# Patient Record
Sex: Female | Born: 1950
Health system: Southern US, Community
[De-identification: ages and names within clinical notes are randomized; demographics above are authoritative.]

## PROBLEM LIST (undated history)

## (undated) DIAGNOSIS — F32A Depression, unspecified: Secondary | ICD-10-CM

## (undated) DIAGNOSIS — F419 Anxiety disorder, unspecified: Secondary | ICD-10-CM

## (undated) DIAGNOSIS — H509 Unspecified strabismus: Secondary | ICD-10-CM

## (undated) DIAGNOSIS — H169 Unspecified keratitis: Secondary | ICD-10-CM

## (undated) DIAGNOSIS — H269 Unspecified cataract: Secondary | ICD-10-CM

## (undated) DIAGNOSIS — I10 Essential (primary) hypertension: Secondary | ICD-10-CM

## (undated) DIAGNOSIS — F329 Major depressive disorder, single episode, unspecified: Secondary | ICD-10-CM

## (undated) DIAGNOSIS — M179 Osteoarthritis of knee, unspecified: Secondary | ICD-10-CM

## (undated) DIAGNOSIS — M1710 Unilateral primary osteoarthritis, unspecified knee: Secondary | ICD-10-CM

## (undated) DIAGNOSIS — I639 Cerebral infarction, unspecified: Secondary | ICD-10-CM

## (undated) DIAGNOSIS — M1990 Unspecified osteoarthritis, unspecified site: Secondary | ICD-10-CM

## (undated) HISTORY — DX: Unspecified keratitis: H16.9

## (undated) HISTORY — DX: Unspecified strabismus: H50.9

## (undated) HISTORY — DX: Depression, unspecified: F32.A

## (undated) HISTORY — PX: EYE SURGERY: SHX253

## (undated) HISTORY — DX: Unspecified cataract: H26.9

## (undated) HISTORY — DX: Anxiety disorder, unspecified: F41.9

## (undated) HISTORY — PX: TUBAL LIGATION: SHX77

## (undated) HISTORY — PX: CORNEAL TRANSPLANT: SHX108

## (undated) HISTORY — DX: Osteoarthritis of knee, unspecified: M17.9

## (undated) HISTORY — PX: CHOLECYSTECTOMY: SHX55

## (undated) HISTORY — DX: Unspecified osteoarthritis, unspecified site: M19.90

## (undated) HISTORY — DX: Unilateral primary osteoarthritis, unspecified knee: M17.10

## (undated) HISTORY — DX: Major depressive disorder, single episode, unspecified: F32.9

---
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
## 1998-07-09 ENCOUNTER — Other Ambulatory Visit: Admission: RE | Admit: 1998-07-09 | Discharge: 1998-07-09 | Payer: Self-pay | Admitting: *Deleted

## 2000-03-22 ENCOUNTER — Encounter: Admission: RE | Admit: 2000-03-22 | Discharge: 2000-03-22 | Payer: Self-pay | Admitting: Obstetrics & Gynecology

## 2000-03-22 ENCOUNTER — Other Ambulatory Visit: Admission: RE | Admit: 2000-03-22 | Discharge: 2000-03-22 | Payer: Self-pay | Admitting: Obstetrics

## 2001-10-14 ENCOUNTER — Inpatient Hospital Stay (HOSPITAL_COMMUNITY): Admission: AD | Admit: 2001-10-14 | Discharge: 2001-10-15 | Payer: Self-pay | Admitting: Internal Medicine

## 2002-01-08 ENCOUNTER — Inpatient Hospital Stay (HOSPITAL_COMMUNITY): Admission: EM | Admit: 2002-01-08 | Discharge: 2002-01-10 | Payer: Self-pay | Admitting: Emergency Medicine

## 2002-01-08 ENCOUNTER — Encounter: Payer: Self-pay | Admitting: Emergency Medicine

## 2008-05-31 ENCOUNTER — Ambulatory Visit (HOSPITAL_COMMUNITY): Admission: RE | Admit: 2008-05-31 | Discharge: 2008-05-31 | Payer: Self-pay | Admitting: Internal Medicine

## 2010-08-28 NOTE — Discharge Summary (Signed)
[place]. [facility]  Patient:    Michelle Carrillo Visit Number: [id number] MRN: [id number]          Service Type: MED Location: 3700 3739 [id number] Attending Physician:  Lewayne Bunting Dictated by:   Guy Franco, P.A. Admit Date:  10/14/2001 Discharge Date: 10/15/2001   CC:         [name] at [facility]   Referring Physician Discharge [place]  DATE OF BIRTH:  February 03, 1951  DISCHARGE DIAGNOSES: 1. Chest pain. 2. Hypertension. 3. Anxiety. 4. Cholelithiasis.  HOSPITAL COURSE:  The patient is a 60 year old Hispanic female who presented in transfer from [name] at [facility].  Over the past 24 hours prior to admission she did not feel well, with occasional chest pain.  Apparently she has recently had ultrasound of her abdomen that revealed cholelithiasis and a cholecystectomy is pending.  She has a history of hypertension, questionable hyperlipidemia, as well as anxiety.  Her 12-lead EKG revealed normal sinus rhythm, rate 92, with nonspecific ST-T wave changes in the inferior lateral leads.  As part of her workup she underwent cardiac isoenzyme testing and these were normal.  Her total cholesterol was 187 with triglyceride 114, HDL 47, LDL 117. Hemoglobin A1c 5.5.  Hemoglobin 11.7, hematocrit 34.8, platelets 268, white count 7.9.  TSH 1.697.  Because she ruled out with her enzymes overnight and remained hemodynamically stable, we did prepare her for discharge to home after a 24-hour admission. She denied any further chest pain or shortness of breath.  The discharge instructions were discussed with the daughter, who is fluent in [place].  DISCHARGE INSTRUCTIONS:  MEDICATIONS: 1. Lisinopril 10 mg q.d. 2. HCTZ 25 mg one q.d. 3. Clorazepate 7.5 mg b.i.d. 4. Nitroglycerin sublingual as needed for chest pain.  ACTIVITY:  No strenuous activity.  DIET:  Low fat diet.  FOLLOW-UP:  The office will call her tomorrow morning  with plans to do a 2-D echo and a stress Cardiolite.  She is not to eat after midnight. Dictated by:   Guy Franco, P.A. Attending Physician:  Lewayne Bunting DD:  10/15/01 TD:  10/16/01 Job: [id number]

## 2010-08-28 NOTE — H&P (Signed)
[place]. [facility]  Patient:    Michelle Carrillo Visit Number: [id number] MRN: [id number]          Service Type: MED Location: 3700 3739 01 Attending Physician:  Lewayne Bunting Dictated by:   Heloise Beecham, M.D. [facility] Admit Date:  10/14/2001 Discharge Date: 10/15/2001                           History and Physical  CHIEF COMPLAINT:  The patient presents with a chief complaint of chest pain.  HISTORY OF PRESENT ILLNESS:  She is a 60 year old Hispanic female, who speaks no English, transferred from [facility], care of [name].  She complains of 24 hours of intermittent sharp stabbing chest pain over the left chest which is nonradiating, lasting only seconds.  She attributes this to anxiety and nervousness.  She reports no associated nausea, vomiting, diaphoresis, radiation, dizziness, etc.  The patient denies any palpitations, lower extremity edema, orthopnea, PND, claudication, etc.  She has been healthy and otherwise doing well until recently other than poorly controlled hypertension.  PAST MEDICAL HISTORY:  1. Hypertension, not well controlled per the patients history.  2. Hyperlipidemia, not treated currently on lipid lowering therapy.  3. Anxiety.  4. Recent eye surgery.  5. Cholelithiasis.  The patient states she has a cholecystectomy pending.  ALLERGIES:  No known drug allergies.  MEDICATIONS:  1. Lisinopril 10 mg q.d.  2. Hydrochlorothiazide 25 mg p.o. q.d.  3. Clorazepate 7.5 mg p.o. b.i.d. as needed.  SOCIAL HISTORY:  [place] and family.  She has four children who are healthy.  She is a Futures trader.  No tobacco, alcohol, or drug use.  FAMILY HISTORY:  Her mother had diabetes.  REVIEW OF SYSTEMS:  Otherwise negative for the following: Fevers, chills, sweats, adenopathy, visual changes, difficulty swallowing, rash, lesions, frequency, urgency, dysuria,  abdominal pain, nausea, vomiting, bright red blood per rectum, melena, polyuria, polydipsia.  All other Review Of Systems negative except for anxiety and occasional diarrhea.  PHYSICAL EXAMINATION:  VITAL SIGNS:  Blood pressure 140/80.  [facility].  They noted that the patient had orthostatic hypotension.  Blood pressures here are as follows: Lying 142/60, heart rate 94; sitting 152/82, heart rate 80; standing 124/80, heart rate 100.  The patient is not dizzy with this change.  The patient is afebrile.  Pulse 100.  Respiratory rate 16. Saturation 98% on room air.  GENERAL:  She is an obese female in no apparent distress.  HEENT:  Normocephalic, atraumatic.  PERRLA.  EOMI.  Oropharynx, oral cavity clear.  NECK:  Supple.  No bruits, no JVD.  Carotid pulses 2+.  No lymphadenopathy.  HEART:  Regular rate and rhythm.  Normal S1 and S2.  No S3 or S4.  The patient has a 1/6 systolic ejection murmur at the base.  PMI is within normal limits.  LUNGS:  Clear to auscultation bilaterally with no wheezes, rales, or rhonchi.  SKIN:  No rash, no lesions.  ABDOMEN:  Soft, nontender, nondistended.  No rebound or guarding.  No hepatosplenomegaly.  EXTREMITIES:  No clubbing, cyanosis, or edema.  Lower extremity 2+ pulses.  NEUROLOGIC:  Intact.  LABORATORY DATA:  Chest x-ray is pending.  EKG shows sinus tachycardia with a rate of 103, normal axis; QRS normal width; slight nonspecific inferolateral T wave inversions.  CBC WBC 11.4, hemoglobin 13.5,  hematocrit 39.6, platelets 346,000.  Other laboratories are currently pending.  ASSESSMENT/PLAN:  1. The patient is a 60 year old female with obesity, hypertension, and     hyperlipidemia, who presents with atypical chest pain.  The pain does not     sound cardiac in nature; however, we will follow the patients symptoms.     She will be monitored on telemetry and we will proceed with rule out     myocardial infarction  protocol.  I suspect that the patient will be     discharged in the morning.  One possibility would be to start an H2     blocker and I will recommend this.  I will continue her current medication     otherwise.  I will not heparinize her and given the patients low     likelihood I think a follow-up stress test as an outpatient will be     warranted, especially for preoperative evaluation for cholecystectomy.     The patient chest pain most likely represents anxiety.  2. Hypertension.  Will continue above medications.  Will follow orthostatic     blood pressures in the morning.  3. Hyperlipidemia.  Lipid panel is pending.  4. Endocrine.  Thyroid-stimulating hormone is pending as well as a     hemoglobin A1C. Dictated by:   Heloise Beecham, M.D. [facility] Attending Physician:  Lewayne Bunting DD:  10/14/01 TD:  10/17/01 Job: [id number]

## 2010-08-28 NOTE — Cardiovascular Report (Signed)
   NAMEEdward Qualia                             ACCOUNT NO.:  [id number]   MEDICAL RECORD NO.:  [id number]                   PATIENT TYPE:  INP   LOCATION:  3742                                 FACILITY:  [facility]   PHYSICIAN:  Salvadore Farber, M.D. [facility]         DATE OF BIRTH:  03/08/1951   DATE OF PROCEDURE:  01/09/2002  DATE OF DISCHARGE:  01/10/2002                              CARDIAC CATHETERIZATION   PROCEDURES:  Left heart catheterization, left ventriculogram, coronary  angiography.   INDICATIONS:  The patient is a 59 year old lade with hypertension and  dyslipidemia who presented in July with chest pain.  ETT/MIBI was without  evidence of ischemia. However, she represented now with recurrent substernal  chest discomfort.  She is referred for diagnostic angiography to make a  definitive diagnosis.   DIAGNOSTIC TECHNIQUE:  Informed consent was obtained.  Under 2% lidocaine  local anesthesia, a 6 French sheath was placed in the right femoral artery  using the modified Seldinger technique. A JL4, JL 3.5, and JR4 catheters  were used.  Angiography was performed by hand injection. A 6 French pigtail  catheter was advanced into the left ventricle.  Pressures were measured.  Ventriculography was performed by power injection.  The patient was  transferred to the holding room in stable condition having tolerated the  procedure well.  The sheaths will be removed in the holding room.   COMPLICATIONS:  None.   FINDINGS:  1. LV:  168/13/18 after the administration of contrast.  EF equals 65%     without region wall motion abnormality.  No mitral regurgitation or     aortic stenosis.  2. Left main:  Short and angiographically normal.  3. LAD:  The LAD is a relatively small vessel which does not reach the apex     of the heart. It gives rise to a single diagonal branch. It is     angiographically normal.  4. Circumflex:  The circumflex is a large vessel giving rise to four obtuse  marginal branches.  It is angiographically normal.  5. RCA:  The RCA is a large vessel with a large PDA which reaches the apex.     It is angiographically normal.   IMPRESSION/PLAN:  Angiographically normal coronary arteries. Plan outpatient  evaluation of noncardiac chest pain.                                                      Salvadore Farber, M.D. [facility]    WED/MEDQ  D:  01/09/2002  T:  01/11/2002  Job:  [id number]

## 2010-08-28 NOTE — Discharge Summary (Signed)
NAMEEdward Carrillo                             ACCOUNT NO.:  [id number]   MEDICAL RECORD NO.:  [id number]                   PATIENT TYPE:  INP   LOCATION:  3742                                 FACILITY:  [facility]   PHYSICIAN:  Doylene Canning. Ladona Ridgel, M.D. [facility]           DATE OF BIRTH:  03-04-1951   DATE OF ADMISSION:  01/08/2002  DATE OF DISCHARGE:  01/10/2002                           DISCHARGE SUMMARY - REFERRING   BRIEF HISTORY:  This is a 60 year old Hispanic female admitted in July with  substernal chest pain.  She was ruled out overnight and discharged home.  A  Cardiolite and 2-D echo were performed on an outpatient basis that were  negative per patient's report.  The patient presented again with substernal  chest pain, dyspnea on exertion and palpitations x3 days; she was admitted  to [facility] on January 08, 2002 for further evaluation.   PAST MEDICAL HISTORY:  Past medical history is significant for hypertension,  hyperlipidemia and anxiety.   ALLERGIES:  No known drug allergies.   SOCIAL HISTORY:  The patient lives in [place] with her husband.  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  She does not use tobacco or alcohol.   HOSPITAL COURSE:  As noted, this patient was admitted to [facility]  for evaluation of chest pain.  She underwent cardiac catheterization on  January 09, 2002, performed by Dr. Salvadore Farber.  She was found to  have normal coronary arteries with an ejection fraction of 65%.  She was  discharged home on January 10, 2002 in improved and stable condition.   LABORATORY AND ACCESSORY CLINICAL DATA:  An EKG showed sinus bradycardia,  rate 57 beats per minute, but was otherwise normal.  A CBC on admission  revealed hemoglobin 15.1, hematocrit 45.4, WBC 9600, platelets 261,000.  Chemistries were unremarkable with a glucose of 123.  AST was elevated at  42, ALP was elevated at 158, total bilirubin elevated at 1.5.  Cardiac  enzymes  were negative.  A lipid profile revealed cholesterol of 184,  triglycerides 102, HDL 48, LDL 116.   DISCHARGE MEDICATIONS:  1. Lisinopril 10 mg daily.  2. Hydrochlorothiazide 25 mg daily.  3. The patient was told to take Tylenol as needed for pain.   ACTIVITY:  She was to avoid any strenuous activity or driving for two days.  She was not to lift more than 10 pounds for one week.   DIET:  She was to be on a low-salt, low-fat diet.   SPECIAL DISCHARGE INSTRUCTIONS:  She was told to call the office if she had  any increased pain, swelling, bleeding or problems with her groin.   FOLLOWUP:  She was to [facility]  She was to follow up with the [facility], October 17th, at 11 a.m.   PROBLEM LIST AT TIME  OF DISCHARGE:  1. Chest pain with negative cardiac enzymes.  2. Cardiac catheterization performed this admission revealing normal     coronary arteries and normal left ventricle.  3. Mildly elevated liver function tests.  4. History of hyperlipidemia.  5. History of anxiety.  6. History of hypertension.     Delton See, P.A. [facility]                  Doylene Canning. Ladona Ridgel, M.D. [facility]    DR/MEDQ  D:  02/13/2002  T:  02/14/2002  Job:  [id number]   cc:   Otho Darner, M.D.  [address]  [place]  [place] [address]  Fax: [phone]

## 2010-08-28 NOTE — H&P (Signed)
NAMEArdine Carrillo EVA                             ACCOUNT NO.:  [id number]   MEDICAL RECORD NO.:  [id number]                   PATIENT TYPE:  INP   LOCATION:                                       FACILITY:  [facility]   PHYSICIAN:  Thomas C. Wall, M.D. [facility]            DATE OF BIRTH:  [birth year]   DATE OF ADMISSION:  01/08/2002  DATE OF DISCHARGE:  01/10/2002                                HISTORY & PHYSICAL   HISTORY OF PRESENT ILLNESS:  The patient is a 60 year old Hispanic female  who is being readmitted for substernal chest pain.  Even with an  interpreter, it is hard to sort out much of this.   She has also had some dyspnea on exertion and palpitations.   She was admitted here in July and ruled out for infarct.  Apparently, she  had a negative stress Cardiolite and echocardiogram in the office; we are  pulling those records at present.   She presents to the ER this afternoon with the same problem.   Cardiac risk factors include hypertension, obesity, and hyperlipidemia; the  lipidemia does not appear to be treated.   She has not had a catheterization.   CURRENT MEDICATIONS:  1. Lisinopril 10 mg a day.  2. Hydrochlorothiazide 25 mg a day.  3. Sublingual nitroglycerin p.r.n.   ALLERGIES:  She has no known drug allergies.   SOCIAL HISTORY:  She lives in [place] with her husband.  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  She enjoys walking.  She does not drink  or smoke.   FAMILY HISTORY:  Mother died of cancer at age 67.  Father is still living  with no coronary disease.   REVIEW OF SYSTEMS:  Review of systems are all unremarkable except for the  HPI.   PHYSICAL EXAMINATION:  VITAL SIGNS:  Her blood pressure is 161/82.  Pulse is  96 and regular.  She is afebrile.  Respiratory rate is 15 and unlabored.  O2  saturation is 98% on room air.  GENERAL:  She is in no acute distress.  HEENT:  Exam is unremarkable.  NECK:  Neck shows no JVD.  Carotid upstrokes are equal  bilaterally without  bruits.  There is no thyromegaly.  HEART:  Heart reveals a soft systolic murmur, left upper sternal border.  S2  does not clearly split.  The murmur is not late-peaking.  LUNGS:  Lungs are clear to auscultation and percussion.  ABDOMEN:  Soft.  Good bowel sounds.  EXTREMITIES:  Extremities are without any cyanosis, clubbing or edema.  Pulses are intact.   LABORATORY AND ACCESSORY CLINICAL DATA:  EKG shows ST segment changes  inferolaterally with sinus tachycardia.   Labs are pending.   ASSESSMENT:  1. Recurrent chest pain, not typical for classic ischemia symptoms, however,     the patient has several risk factors and continues  to come the hospital     with chest pain.  2. Hypertension.  3. Hyperlipidemia.   PLAN:  1. Cardiac catheterization for diagnostic purposes.  Indications, risks and     potential benefits have been explained to the patient through the     interpreter with Guy Franco, P.A.  2. Check lipid panel in the morning.                                               Thomas C. Daleen Squibb, M.D. [facility]    TCW/MEDQ  D:  01/08/2002  T:  01/10/2002  Job:  [id number]

## 2011-10-27 DIAGNOSIS — T868419 Corneal transplant failure, unspecified eye: Secondary | ICD-10-CM | POA: Insufficient documentation

## 2014-01-04 DIAGNOSIS — Z8619 Personal history of other infectious and parasitic diseases: Secondary | ICD-10-CM | POA: Insufficient documentation

## 2014-10-14 ENCOUNTER — Encounter (HOSPITAL_COMMUNITY): Payer: Self-pay | Admitting: Emergency Medicine

## 2014-10-14 ENCOUNTER — Emergency Department (HOSPITAL_COMMUNITY)
Admission: EM | Admit: 2014-10-14 | Discharge: 2014-10-15 | Disposition: A | Discharge status: Home / Self Care | Payer: Self-pay | Attending: Emergency Medicine | Admitting: Emergency Medicine

## 2014-10-14 DIAGNOSIS — R682 Dry mouth, unspecified: Secondary | ICD-10-CM | POA: Insufficient documentation

## 2014-10-14 DIAGNOSIS — Z3202 Encounter for pregnancy test, result negative: Secondary | ICD-10-CM | POA: Insufficient documentation

## 2014-10-14 DIAGNOSIS — R0602 Shortness of breath: Secondary | ICD-10-CM

## 2014-10-14 DIAGNOSIS — I10 Essential (primary) hypertension: Secondary | ICD-10-CM | POA: Insufficient documentation

## 2014-10-14 DIAGNOSIS — F419 Anxiety disorder, unspecified: Secondary | ICD-10-CM

## 2014-10-14 DIAGNOSIS — R300 Dysuria: Secondary | ICD-10-CM | POA: Insufficient documentation

## 2014-10-14 HISTORY — DX: Essential (primary) hypertension: I10

## 2014-10-14 NOTE — ED Notes (Signed)
MD at bedside. 

## 2014-10-14 NOTE — ED Provider Notes (Signed)
CSN: [id number]     Arrival date complaints of hypertension and dry mouth. Complaint of hypertension& time 10/14/14  2339 History   This chart was scribed for Merryl Hacker, MD by Forrestine Him, ED Scribe. This patient was seen in room B19C/B19C and the patient's care was started 11:55 PM.   Chief Complaint  Patient presents with  . Hypertension   The history is provided by the patient. No language interpreter was used.     HPI Comments: Michelle Carrillo is a 64 y.o. female with a PMHx of HTN who presents to the Emergency Department here for hypertension this evening. She also reports new onset dry mouth and dysuria. Pt states she began to experience shortness of breath and assumed this was related to elevation in her blood pressure. She is on prescribed medication for blood pressure and denies any recent missed doses. Last dose taken earlier today. No cough, fever, chills, nausea, vomiting, abdominal pain, chest pain, or leg swelling. Michelle Carrillo was recently started on a new medication for anxiety 1 month ago and reports "feeling bad" since onset of starting medication. PSHx includes eye surgery 9 months ago. No known allergies to medications.  Granddaughter at bedside interpreting.  Past Medical History  Diagnosis Date  . Hypertension    History reviewed. No pertinent past surgical history. No family history on file. History  Substance Use Topics  . Smoking status: Never Smoker   . Smokeless tobacco: Not on file  . Alcohol Use: No   OB History    No data available     Review of Systems  Constitutional: Negative for fever and chills.  HENT:       Dry mouth  Respiratory: Positive for shortness of breath. Negative for cough and chest tightness.   Cardiovascular: Negative for chest pain and leg swelling.  Gastrointestinal: Negative for nausea, vomiting and abdominal pain.  Genitourinary: Positive for dysuria.  Psychiatric/Behavioral: Negative for confusion.  All other systems  reviewed and are negative.     Allergies  Review of patient's allergies indicates no known allergies.  Home Medications   Prior to Admission medications   Not on File   Triage Vitals: BP 151/70 mmHg  Pulse 81  Temp(Src) 97.9 F (36.6 C) (Oral)  Resp 16  SpO2 96%   Physical Exam  Constitutional: She is oriented to person, place, and time. She appears well-developed and well-nourished. No distress.  HENT:  Head: Normocephalic and atraumatic.  Mouth/Throat: Oropharynx is clear and moist.  Eyes: Pupils are equal, round, and reactive to light.  Neck: Neck supple.  Cardiovascular: Normal rate, regular rhythm and normal heart sounds.   No murmur heard. Pulmonary/Chest: Effort normal and breath sounds normal. No respiratory distress. She has no wheezes.  Abdominal: Soft. Bowel sounds are normal. There is no tenderness. There is no rebound.  Musculoskeletal: She exhibits no edema.  Neurological: She is alert and oriented to person, place, and time.  Skin: Skin is warm and dry.  Psychiatric: She has a normal mood and affect.  Nursing note and vitals reviewed.   ED Course  Procedures (including critical care time)  DIAGNOSTIC STUDIES: Oxygen Saturation is 998% on RA, normal by my interpretation.    COORDINATION OF CARE: 12:02 AM- Will order CBC, CMP, EKG, troponin I, CXR, D-dimer, and urinalysis. Discussed treatment plan with pt at bedside and pt agreed to plan.     Labs Review Labs Reviewed  CBC WITH DIFFERENTIAL/PLATELET - Abnormal; Notable for the following:  WBC 13.7 (*)    Lymphs Abs 5.3 (*)    All other components within normal limits  COMPREHENSIVE METABOLIC PANEL - Abnormal; Notable for the following:    Sodium 133 (*)    Potassium 3.2 (*)    Chloride 97 (*)    Glucose, Bld 191 (*)    Calcium 8.7 (*)    Alkaline Phosphatase 130 (*)    All other components within normal limits  URINALYSIS, ROUTINE W REFLEX MICROSCOPIC (NOT AT [facility]) - Abnormal; Notable for  the following:    Hgb urine dipstick TRACE (*)    Protein, ur 100 (*)    All other components within normal limits  D-DIMER, QUANTITATIVE (NOT AT [facility]) - Abnormal; Notable for the following:    D-Dimer, Quant 1.20 (*)    All other components within normal limits  TROPONIN I  URINE MICROSCOPIC-ADD ON  POC URINE PREG, ED    Imaging Review Ct Angio Chest Pe W/cm &/or Wo Cm  10/15/2014   CLINICAL DATA:  Acute onset of shortness of breath and elevated D-dimer. Initial encounter.  EXAM: CT ANGIOGRAPHY CHEST WITH CONTRAST  TECHNIQUE: Multidetector CT imaging of the chest was performed using the standard protocol during bolus administration of intravenous contrast. Multiplanar CT image reconstructions and MIPs were obtained to evaluate the vascular anatomy.  CONTRAST:  20mL OMNIPAQUE IOHEXOL 350 MG/ML SOLN  COMPARISON:  None.  FINDINGS: There is no evidence of pulmonary embolus.  There is a mildly mosaic pattern of parenchymal attenuation within the lungs. The lungs are otherwise clear. There is no evidence of significant focal consolidation, pleural effusion or pneumothorax. No masses are identified; no abnormal focal contrast enhancement is seen.  The mediastinum is unremarkable in appearance. No mediastinal lymphadenopathy is seen. No pericardial effusion is identified. The great vessels are grossly unremarkable in appearance. No axillary lymphadenopathy is seen. The thyroid gland is unremarkable in appearance.  The visualized portions of the liver and spleen are unremarkable. The patient is status post cholecystectomy. Clips are noted along the gallbladder fossa. The visualized portions of the pancreas, adrenal glands and kidneys are grossly unremarkable. A single diverticulum is noted at the splenic flexure of the colon.  No acute osseous abnormalities are seen. Anterior bridging osteophytes are seen along the thoracic spine.  Review of the MIP images confirms the above findings.  IMPRESSION: 1. No  evidence of pulmonary embolus. 2. Mildly mosaic pattern of parenchymal attenuation within the lungs. Lungs otherwise clear.   Electronically Signed   By: Garald Balding M.D.   On: 10/15/2014 02:15     EKG Interpretation   Date/Time:  Monday October 14 2014 23:58:05 EDT Ventricular Rate:  88 PR Interval:  148 QRS Duration: 96 QT Interval:  358 QTC Calculation: 433 R Axis:   60 Text Interpretation:  Sinus rhythm Nonspecific repol abnormality, lateral  leads Confirmed by Kaithlyn Teagle  MD, Court Gracia ([id number]) on 10/15/2014 12:16:27 AM      MDM   Final diagnoses:  SOB (shortness of breath)  Anxiety    Patient presented initially with hypertension and dry mouth. States that she felt like her blood pressure was high because of shortness of breath. Was notably tachycardic on admission. Did appear anxious. Denies any history of clot and is otherwise low risk. D-dimer was sent. Patient's initial blood pressure 179/92. During her stay, this improved. She never had any chest pain. EKG is nonischemic and troponin was negative. Patient initially refused x-ray. However, her d-dimer was elevated. Discussed with patient  the importance of ruling out blood clot given her vital sign abnormalities and shortness of breath. Patient has consented for CT testing. CT angio negative. Patient has remained comfortable while in the ER and has had no recurrence of symptoms. Will have patient Follow-up with cardiology given her risk factors.  After history, exam, and medical workup I feel the patient has been appropriately medically screened and is safe for discharge home. Pertinent diagnoses were discussed with the patient. Patient was given return precautions.  I personally performed the services described in this documentation, which was scribed in my presence. The recorded information has been reviewed and is accurate.   Merryl Hacker, MD 10/15/14 [phone]

## 2014-10-14 NOTE — ED Notes (Signed)
Pt. reports dry mouth and hypertension onset this evening . Denies nausea / no fever or chills.

## 2014-10-15 ENCOUNTER — Emergency Department (HOSPITAL_COMMUNITY): Payer: Self-pay

## 2014-10-15 ENCOUNTER — Encounter (HOSPITAL_COMMUNITY): Payer: Self-pay

## 2014-10-15 LAB — COMPREHENSIVE METABOLIC PANEL
ALT: 21 U/L (ref 14–54)
AST: 27 U/L (ref 15–41)
Albumin: 3.7 g/dL (ref 3.5–5.0)
Alkaline Phosphatase: 130 U/L — ABNORMAL HIGH (ref 38–126)
Anion gap: 11 (ref 5–15)
BUN: 14 mg/dL (ref 6–20)
CO2: 25 mmol/L (ref 22–32)
Calcium: 8.7 mg/dL — ABNORMAL LOW (ref 8.9–10.3)
Chloride: 97 mmol/L — ABNORMAL LOW (ref 101–111)
Creatinine, Ser: 0.69 mg/dL (ref 0.44–1.00)
GFR calc Af Amer: >60 mL/min (ref >60)
GFR calc non Af Amer: >60 mL/min (ref >60)
Glucose, Bld: 191 mg/dL — ABNORMAL HIGH (ref 65–99)
Potassium: 3.2 mmol/L — ABNORMAL LOW (ref 3.5–5.1)
Sodium: 133 mmol/L — ABNORMAL LOW (ref 135–145)
Total Bilirubin: 0.4 mg/dL (ref 0.3–1.2)
Total Protein: 7.9 g/dL (ref 6.5–8.1)

## 2014-10-15 LAB — CBC WITH DIFFERENTIAL/PLATELET
Basophils Absolute: 0 10*3/uL (ref 0.0–0.1)
Basophils Relative: 0 % (ref 0–1)
Eosinophils Absolute: 0.1 10*3/uL (ref 0.0–0.7)
Eosinophils Relative: 1 % (ref 0–5)
HCT: 39.2 % (ref 36.0–46.0)
Hemoglobin: 13.3 g/dL (ref 12.0–15.0)
Lymphocytes Relative: 39 % (ref 12–46)
Lymphs Abs: 5.3 10*3/uL — ABNORMAL HIGH (ref 0.7–4.0)
MCH: 31.2 pg (ref 26.0–34.0)
MCHC: 33.9 g/dL (ref 30.0–36.0)
MCV: 92 fL (ref 78.0–100.0)
Monocytes Absolute: 1 10*3/uL (ref 0.1–1.0)
Monocytes Relative: 7 % (ref 3–12)
Neutro Abs: 7.3 10*3/uL (ref 1.7–7.7)
Neutrophils Relative %: 53 % (ref 43–77)
Platelets: 298 10*3/uL (ref 150–400)
RBC: 4.26 MIL/uL (ref 3.87–5.11)
RDW: 13.2 % (ref 11.5–15.5)
WBC: 13.7 10*3/uL — ABNORMAL HIGH (ref 4.0–10.5)

## 2014-10-15 LAB — URINE MICROSCOPIC-ADD ON

## 2014-10-15 LAB — URINALYSIS, ROUTINE W REFLEX MICROSCOPIC
Bilirubin Urine: NEGATIVE
Glucose, UA: NEGATIVE mg/dL
Ketones, ur: NEGATIVE mg/dL
Leukocytes, UA: NEGATIVE
Nitrite: NEGATIVE
Protein, ur: 100 mg/dL — AB
Specific Gravity, Urine: 1.009 (ref 1.005–1.030)
Urobilinogen, UA: 0.2 mg/dL (ref 0.0–1.0)
pH: 6 (ref 5.0–8.0)

## 2014-10-15 LAB — POC URINE PREG, ED: Preg Test, Ur: NEGATIVE

## 2014-10-15 LAB — TROPONIN I: Troponin I: <0.03 ng/mL (ref <0.031)

## 2014-10-15 LAB — D-DIMER, QUANTITATIVE: D-Dimer, Quant: 1.2 ug/mL-FEU — ABNORMAL HIGH (ref 0.00–0.48)

## 2014-10-15 MED ORDER — IOHEXOL 350 MG/ML SOLN
80.0000 mL | Freq: Once | INTRAVENOUS | Status: AC | PRN
  Administered 2014-10-15: 80 mL via INTRAVENOUS

## 2014-10-15 NOTE — ED Notes (Signed)
Patient refused to go to XR. She wants a full diagnostic analysis of her medications and her symptoms  without using any diagnostic tools.

## 2014-10-15 NOTE — ED Notes (Addendum)
Pt refused chest xray; states she does not feel like she needs it; MD Notified

## 2014-10-15 NOTE — Discharge Instructions (Signed)
Hipertensin (Hypertension) La hipertensin, conocida comnmente como presin arterial alta, se produce cuando la sangre bombea en las arterias con mucha fuerza. Las arterias son los vasos sanguneos que transportan la sangre desde el corazn hacia todas las partes del cuerpo. Una lectura de la presin arterial consiste en un nmero ms alto sobre un nmero ms bajo, por ejemplo, 110/72. El nmero ms alto (presin sistlica) corresponde a la presin interna de las arterias cuando el corazn [place]. El nmero ms bajo (presin diastlica) corresponde a la presin interna de las arterias cuando el corazn se relaja. En condiciones ideales, la presin arterial debe ser inferior a 120/80. La hipertensin fuerza al corazn a trabajar ms para Herbalist. Las arterias pueden estrecharse o ponerse rgidas. La hipertensin conlleva el riesgo de enfermedad cardaca, ictus y otros problemas.  Solis de riesgo de hipertensin son controlables, pero otros no lo son.  [employer] factores de riesgo que usted no puede Chief Technology Officer, se incluyen:   Manufacturing systems engineer. El riesgo es mayor para las Retail banker.  La edad. Los riesgos aumentan con la edad.  El sexo. Antes de los 45aos, los hombres corren ms [employer]. Despus de los 65aos, las mujeres corren ms [employer]. Entre los factores de riesgo que usted puede Chief Technology Officer, se incluyen:  No hacer la cantidad suficiente de actividad fsica o ejercicio.  Tener sobrepeso.  Consumir mucha grasa, azcar, caloras o sal en la dieta.  Beber alcohol en exceso. SIGNOS Y SNTOMAS Por lo general, la hipertensin no causa signos o sntomas. La hipertensin demasiado alta (crisis hipertensiva) puede causar dolor de cabeza, ansiedad, falta de aire y hemorragia nasal. DIAGNSTICO  Para detectar si usted tiene hipertensin, el mdico le medir la presin arterial mientras est sentado, con el brazo  levantado a la altura del corazn. Debe medirla al [facility] veces en el mismo brazo. Determinadas condiciones pueden causar una diferencia de presin arterial entre el brazo izquierdo y Insurance underwriter. El hecho de tener una sola lectura de la presin arterial ms alta que lo normal no significa que Stage manager. En el caso de tener una lectura de la presin arterial con un valor alto, pdale al mdico que la verifique nuevamente. [employer] hipertensin arterial incluye hacer cambios en el estilo de vida y, posiblemente, tomar medicamentos. Un estilo de vida saludable puede ayudar a bajar la presin arterial alta. Quiz deba cambiar algunos hbitos. Los [employer] Strauss en el estilo de vida pueden incluir:  Seguir la dieta DASH. Esta dieta tiene un alto contenido de frutas, verduras y Psychologist, prison and probation services. Incluye poca cantidad de sal, carnes rojas y azcares agregados.  Hacer al menos 2horas de actividad fsica enrgica todas las semanas.  Perder peso, si es necesario.  No fumar.  Limitar el consumo de bebidas alcohlicas.  Aprender formas de reducir el estrs. Si los cambios en el estilo de vida no son suficientes para Child psychotherapist la presin arterial, el mdico puede recetarle medicamentos. Quiz necesite tomar ms de uno. Trabaje en conjunto con su mdico para comprender los riesgos y los beneficios. INSTRUCCIONES PARA EL CUIDADO EN EL HOGAR  Haga que le midan de nuevo la presin arterial segn las indicaciones del [place] los medicamentos solamente como se lo haya indicado el mdico. Siga cuidadosamente las indicaciones. Los medicamentos para la presin arterial deben tomarse segn las indicaciones. Los medicamentos pierden eficacia al omitir las dosis. El hecho de omitir  las dosis tambin aumenta el riesgo de otros [place].  No fume.  Contrlese la presin arterial en su casa segn las indicaciones del mdico. SOLICITE ATENCIN MDICA SI:   Piensa  que tiene una reaccin alrgica a los medicamentos.  Tiene mareos o dolores de cabeza con Scientist, research (physical sciences).  Tiene hinchazn en los tobillos.  Tiene problemas de visin. SOLICITE ATENCIN MDICA DE INMEDIATO SI:  Siente un dolor de cabeza intenso o confusin.  Siente debilidad inusual, adormecimiento o que Geneticist, molecular.  Siente dolor intenso en el pecho o en el abdomen.  Vomita repetidas veces.  Tiene dificultad para respirar. ASEGRESE DE QUE:   Comprende estas instrucciones.  Controlar su afeccin.  Recibir ayuda de inmediato si no mejora o si empeora. Document Released: 03/29/2005 Document Revised: 08/13/2013 [facility] Patient Information 2015 [place], [place]. This information is not intended to replace advice given to you by your health care provider. Make sure you discuss any questions you have with your health care provider. Falta de aire (Shortness of Breath) La falta de aire significa que tiene dificultad para respirar. Tambin puede significar que tiene un problema mdico. Debe solicitar atencin mdica de inmediato si siente que Risk manager. CAUSAS   Insuficiente oxgeno en el aire, como en las grandes alturas o un ambiente lleno de humo.  Ciertas enfermedades pulmonares, infecciones o problemas.  Enfermedades o afecciones cardacas, como angina de pecho o insuficiencia cardaca.  Nivel bajo de glbulos rojos (anemia).  Condicin fsica deficiente, que puede provocar falta de aire al hacer ejercicio.  Lesiones o entumecimiento en el pecho o en la espalda.  Tener sobrepeso.  Fumar.  Ansiedad, que puede hacer que sienta que no tiene aire suficiente. DIAGNSTICO  A menudo, los problemas mdicos graves se encuentran durante el examen fsico. Para determinar porqu sufre de falta de aire podrn realizarle diferentes pruebas. Entre ellas:  Radiografa de trax.  Pruebas funcionales respiratorias.  Anlisis de [place].  Un electrocardiograma (ECG).  Un  electrocardiograma ambulatorio. Un ECG ambulatorio registra los patrones de los latidos cardacos durante 24horas.  Prueba de ejercicio.  Un ecocardiograma transtorcico (ETT). Durante IT trainer, se usan ondas sonoras para evaluar el flujo de la sangre a travs del corazn.  Un ecocardiograma transesofgico (ETE).  Diagnsticos por imgenes. Puede ser que el mdico no encuentre una causa para su falta de aire despus del examen. En este caso, es importante que concurra a un examen de control, segn las indicaciones del mdico.  TRATAMIENTO  El tratamiento de la falta de aire depende de la causa de sus sntomas y Economist. INSTRUCCIONES PARA EL CUIDADO EN EL HOGAR   No fumar. Fumar es una causa frecuente de la falta de aire. Si fuma, solicite ayuda para dejar de hacerlo.  Evite estar cerca de sustancias qumicas o de aquellas cosas que puedan interferir en la respiracin, como vapores de pinturas y polvo.  Descanse todo lo que sea necesario. Retome sus actividades habituales gradualmente.  Si le indicaron medicamentos, tmelos como se los han prescrito durante todo el tiempo indicado. Estos incluyen el oxgeno y los medicamentos inhalados.  Cumpla con todas las visitas de control, segn le indique su mdico. SOLICITE ATENCIN MDICA SI:   Su afeccin no mejora en el tiempo esperado.  Le cuesta hacer las actividades cotidianas, aun si ha descansado lo suficiente.  Aparece algn sntoma nuevo. [place] DE INMEDIATO SI:   La falta de aire empeora.  Se siente aturdido, se desmaya o tiene tos que  no controla con medicamentos.  Elimina sangre al toser.  Siente dolor al respirar.  Tiene dolor de pecho o en los brazos, hombros o abdomen.  Tiene fiebre.  No logra subir escaleras o realizar ejercicio del modo en que lo haca habitualmente. ASEGRESE DE QUE:  Comprende estas instrucciones.  Controlar su afeccin.  Recibir ayuda de inmediato  si no mejora o si empeora. Document Released: 01/06/2005 Document Revised: 04/03/2013 [facility] Patient Information 2015 [place]. This information is not intended to replace advice given to you by your health care provider. Make sure you discuss any questions you have with your health care provider.

## 2014-10-15 NOTE — ED Notes (Signed)
Patient transported to CT 

## 2014-10-19 ENCOUNTER — Emergency Department (HOSPITAL_COMMUNITY)
Admission: EM | Admit: 2014-10-19 | Discharge: 2014-10-19 | Disposition: A | Discharge status: Home / Self Care | Payer: Self-pay | Attending: Emergency Medicine | Admitting: Emergency Medicine

## 2014-10-19 ENCOUNTER — Encounter (HOSPITAL_COMMUNITY): Payer: Self-pay | Admitting: Emergency Medicine

## 2014-10-19 DIAGNOSIS — F419 Anxiety disorder, unspecified: Secondary | ICD-10-CM | POA: Insufficient documentation

## 2014-10-19 DIAGNOSIS — R110 Nausea: Secondary | ICD-10-CM | POA: Insufficient documentation

## 2014-10-19 DIAGNOSIS — Z79899 Other long term (current) drug therapy: Secondary | ICD-10-CM | POA: Insufficient documentation

## 2014-10-19 DIAGNOSIS — I10 Essential (primary) hypertension: Secondary | ICD-10-CM | POA: Insufficient documentation

## 2014-10-19 DIAGNOSIS — R002 Palpitations: Secondary | ICD-10-CM | POA: Insufficient documentation

## 2014-10-19 LAB — I-STAT TROPONIN, ED: Troponin i, poc: 0 ng/mL (ref 0.00–0.08)

## 2014-10-19 LAB — BASIC METABOLIC PANEL
Anion gap: 13 (ref 5–15)
BUN: 8 mg/dL (ref 6–20)
CO2: 23 mmol/L (ref 22–32)
Calcium: 9.1 mg/dL (ref 8.9–10.3)
Chloride: 96 mmol/L — ABNORMAL LOW (ref 101–111)
Creatinine, Ser: 0.62 mg/dL (ref 0.44–1.00)
GFR calc Af Amer: >60 mL/min (ref >60)
GFR calc non Af Amer: >60 mL/min (ref >60)
Glucose, Bld: 179 mg/dL — ABNORMAL HIGH (ref 65–99)
Potassium: 3.3 mmol/L — ABNORMAL LOW (ref 3.5–5.1)
Sodium: 132 mmol/L — ABNORMAL LOW (ref 135–145)

## 2014-10-19 LAB — CBC
HCT: 39.3 % (ref 36.0–46.0)
Hemoglobin: 13.3 g/dL (ref 12.0–15.0)
MCH: 31.1 pg (ref 26.0–34.0)
MCHC: 33.8 g/dL (ref 30.0–36.0)
MCV: 91.8 fL (ref 78.0–100.0)
Platelets: 305 10*3/uL (ref 150–400)
RBC: 4.28 MIL/uL (ref 3.87–5.11)
RDW: 13.1 % (ref 11.5–15.5)
WBC: 11.5 10*3/uL — ABNORMAL HIGH (ref 4.0–10.5)

## 2014-10-19 MED ORDER — LORAZEPAM 0.5 MG PO TABS: 0.5000 mg | ORAL_TABLET | Freq: Three times a day (TID) | ORAL | Status: DC | PRN

## 2014-10-19 MED ORDER — LORAZEPAM 2 MG/ML IJ SOLN
1.0000 mg | Freq: Once | INTRAMUSCULAR | Status: AC
  Administered 2014-10-19: 1 mg via INTRAVENOUS
  Filled 2014-10-19: qty 1

## 2014-10-19 NOTE — ED Notes (Signed)
Pt/translator stated, she is having a racing heart and feels dizzy since last night. Denies any pain

## 2014-10-19 NOTE — ED Provider Notes (Signed)
CSN: [id number]     Arrival date & time 10/19/14  1031 History   First MD Initiated Contact with Patient 10/19/14 1036     Chief Complaint  Patient presents with  . Tachycardia  . Dizziness     (Consider location/radiation/quality/duration/timing/severity/associated sxs/prior Treatment) HPI Comments: 64 year old female with a history of hypertension presenting with palpitations and dizziness beginning yesterday evening while she was at home. States her heart feels like it is pounding out of her chest and making her feel dizzy, described as a lightheadedness. No aggravating or alleviating factors. Denies chest pain or shortness of breath but feels slightly nauseated. Symptoms began after checking her blood pressure with her home blood pressure cuff which was reading "200" and made her anxious. Both patient and daughter state the patient has a significant history of anxiety and gets very nervous and worked up easily. She was seen in the ED 4 days ago for hypertension, had a negative workup including a CT angiogram to rule out PE which was negative. States she did not have palpitations at that time. One month ago her blood pressure medication was switched to lisinopril-hydrochlorothiazide. She is taking her blood pressure medications as prescribed.  Patient is a 64 y.o. female presenting with dizziness. The history is provided by the patient. The history is limited by a language barrier. A language interpreter was used.  Dizziness Associated symptoms: nausea and palpitations     Past Medical History  Diagnosis Date  . Hypertension    History reviewed. No pertinent past surgical history. No family history on file. History  Substance Use Topics  . Smoking status: Never Smoker   . Smokeless tobacco: Not on file  . Alcohol Use: No   OB History    No data available     Review of Systems  Cardiovascular: Positive for palpitations.  Gastrointestinal: Positive for nausea.  Neurological:  Positive for dizziness.  Psychiatric/Behavioral: The patient is nervous/anxious.   All other systems reviewed and are negative.     Allergies  Review of patient's allergies indicates no known allergies.  Home Medications   Prior to Admission medications   Medication Sig Start Date End Date Taking? Authorizing Provider  lisinopril-hydrochlorothiazide (PRINZIDE,ZESTORETIC) 20-25 MG per tablet Take 1 tablet by mouth daily.   Yes Historical Provider, MD  PARoxetine (PAXIL) 10 MG tablet Take 10 mg by mouth daily.   Yes Historical Provider, MD  LORazepam (ATIVAN) 0.5 MG tablet Take 1 tablet (0.5 mg total) by mouth every 8 (eight) hours as needed for anxiety. 10/19/14   Hart Haas M Elizah Lydon, PA-C   BP 105/52 mmHg  Pulse 74  Temp(Src) 98.2 F (36.8 C) (Oral)  Resp 14  Ht 5' (1.524 m)  Wt 173 lb (78.472 kg)  BMI 33.79 kg/m2  SpO2 96% Physical Exam  Constitutional: She is oriented to person, place, and time. She appears well-developed and well-nourished. No distress.  HENT:  Head: Normocephalic and atraumatic.  Mouth/Throat: Oropharynx is clear and moist.  Eyes: Conjunctivae and EOM are normal. Pupils are equal, round, and reactive to light.  Neck: Normal range of motion. Neck supple. No JVD present.  Cardiovascular: Normal rate, regular rhythm, normal heart sounds and intact distal pulses.   No extremity edema.  Pulmonary/Chest: Effort normal and breath sounds normal. No respiratory distress.  Abdominal: Soft. Bowel sounds are normal. There is no tenderness.  Musculoskeletal: Normal range of motion. She exhibits no edema.  Neurological: She is alert and oriented to person, place, and time. She has  normal strength. No sensory deficit.  Speech fluent, goal oriented. Moves extremities without ataxia. Equal grip strength bilateral.  Skin: Skin is warm and dry. She is not diaphoretic.  Psychiatric: She has a normal mood and affect. Her behavior is normal.  Nursing note and vitals  reviewed.   ED Course  Procedures (including critical care time) Labs Review Labs Reviewed  CBC - Abnormal; Notable for the following:    WBC 11.5 (*)    All other components within normal limits  BASIC METABOLIC PANEL - Abnormal; Notable for the following:    Sodium 132 (*)    Potassium 3.3 (*)    Chloride 96 (*)    Glucose, Bld 179 (*)    All other components within normal limits  I-STAT TROPOININ, ED    Imaging Review No results found.   EKG Interpretation   Date/Time:  Saturday October 19 2014 10:39:43 EDT Ventricular Rate:  117 PR Interval:  142 QRS Duration: 88 QT Interval:  312 QTC Calculation: 435 R Axis:   60 Text Interpretation:  Sinus tachycardia ST \\T \ T wave abnormality,  consider inferolateral ischemia Abnormal ECG no significant change since 5  days ago Confirmed by GOLDSTON  MD, SCOTT ([id number]) on 10/19/2014 10:41:28 AM      MDM   Final diagnoses:  Palpitations  Anxiety   Nontoxic appearing, NAD. Heart rate 114 on arrival, vitals otherwise stable. She appears very anxious. Had a negative workup 4 days ago as stated above. Symptoms began after checking blood pressure at home which made her anxious. During initial examination, patient's heart rate was no greater than 95. EKG without any acute changes. Workup negative. No associated chest pain or shortness of breath. Ativan given with complete resolution of her palpitations. Doubt cardiac. Very unlikely PE given negative CT angiogram 4 days ago. HEART score 3. Will give rx for 10 tablets of ativan for severe anxiety, and advised her to follow-up with her PCP within one week for further discussion. Advised her to get her blood pressure cuff calibrated. Stable for discharge. Return precautions given. Patient states understanding of treatment care plan and is agreeable.  Discussed with attending Dr. Regenia Skeeter who agrees with plan of care.  Carman Ching, PA-C 10/19/14 1234  Sherwood Gambler, MD 10/20/14 [phone]

## 2014-10-19 NOTE — Discharge Instructions (Signed)
Take Ativan as needed as directed for muscle spasm. No driving or operating heavy machinery while taking valium. This medication may cause drowsiness. Follow up with your primary care doctor within 1 week.  Palpitaciones (Palpitations) Es la sensacin de sentir que el latido cardaco es irregular o es ms rpido que lo normal. Se siente como un aleteo o que falta un latido. Generalmente no es un problema grave. Sin embargo, en algunos casos podra ser necesario hacer ms estudios diagnsticos. CAUSAS  Las causas de las palpitaciones pueden ser:  Georgiann Hahn.  El consumo de cafena u otros estimulantes, como pastillas para Horticulturist, commercial o bebidas energizantes.  Alcohol.  Situaciones de estrs y [place].  La actividad fsica extenuante.  Fatiga.  Algunos medicamentos.  Enfermedad cardaca, especialmente si tiene antecedentes de ritmo cardaco irregular (arritmia), como fibrilacin auricular, aleteo auricular o taquicardia supraventricular.  El uso incorrecto de un marcapasos o Estate agent. DIAGNSTICO  Para hallar la causa de las palpitaciones, el mdico le har una historia clnica y un examen fsico. El mdico tambin puede hacerle un estudio llamado electrocardiograma (ECG) ambulatorio. El ECG registra el patrn de los latidos cardacos durante un perodo de 24horas. Tambin pueden hacerle otros estudios, por ejemplo:  Ecocardiograma transtorcico (ETT). Durante IT trainer, se usan ondas sonoras para evaluar cmo fluye la sangre por el corazn.  Ecocardiograma transesofgico (ETE).  Monitoreo cardaco. Este estudio permite que el mdico controle la frecuencia y el ritmo cardaco en tiempo real.  Monitor Holter. Es un dispositivo porttil que [employer] latidos cardacos y [place] a Retail buyer las arritmias cardacas. Le permite al [employer] registrar la actividad [place], si es necesario.  Pruebas de estrs por ejercicio o por medicamentos que aceleran los  latidos cardacos. [place] palpitaciones depende de la causa y puede variar mucho. En la [employer] no se requiere otro tratamiento que esperar, Nurse, children's y Magazine features editor los sntomas. Otras causas, como la fibrilacin auricular, el aleteo auricular o la taquicardia supraventricular generalmente requieren Lexicographer. INSTRUCCIONES PARA EL CUIDADO EN EL HOGAR   Evite:  Bebidas que contengan cafena como el caf, el t, los refrescos, las pastillas para Horticulturist, commercial y las bebidas energizantes.  Chocolate.  Alcohol.  Si fuma, abandone el hbito.  Reduzca los niveles de estrs y [place]. Algunas cosas que pueden ayudarlo a relajarse son:  Un mtodo para controlar el cuerpo con la mente, por ejemplo, controlar los latidos (biorregulacin).  El yoga.  La meditacin.  La actividad fsica como natacin, trote o caminatas.  Descanse y duerma lo suficiente. SOLICITE ATENCIN MDICA SI:   Contina con latidos cardacos rpidos o irregulares durante ms de 24 horas.  Las [employer] suceden con ms frecuencia. SOLICITE ATENCIN MDICA DE INMEDIATO SI:  Siente falta de aire o dolor en el pecho.  Sufre un dolor intenso de [place].  Se siente mareado o se desmaya. ASEGRESE DE QUE:  Comprende estas instrucciones.  Controlar su afeccin.  Recibir ayuda de inmediato si no mejora o si empeora. Document Released: 01/06/2005 Document Revised: 04/03/2013 [facility] Patient Information 2015 [place]. This information is not intended to replace advice given to you by your health care provider. Make sure you discuss any questions you have with your health care provider.  Crisis de [place] (Panic Attacks) Las crisis de [place] son ataques repentinos y [place] de [place], miedo o Tree surgeon extremos. Es posible que ocurran sin motivo, cuando est relajado, ansioso o cuando duerme. Las crisis de [place] pueden  ocurrir por algunas de estas  razones:   En ocasiones, las personas sanas presentan crisis de [place] en situaciones extremas, potencialmente mortales, como la guerra o los desastres naturales. La ansiedad normal es un mecanismo de defensa del cuerpo que nos ayuda a Firefighter ante situaciones de peligro (respuesta de defensa o huida).  Con frecuencia, las crisis de [place] aparecen acompaadas de trastornos de ansiedad, como trastorno de pnico, trastorno de ansiedad social, trastorno de ansiedad generalizada y fobias. Los trastornos de ansiedad provocan ansiedad excesiva o incontrolable. Sus relaciones y actividades pueden verse Technical sales engineer.  En ocasiones, las crisis de ansiedad se presentan con otras enfermedades mentales, como la depresin y el trastorno por estrs postraumtico.  Algunas enfermedades, medicamentos recetados y drogas pueden provocar crisis de [place]. SNTOMAS  Las crisis de [place] comienzan repentinamente, Artist punto mximo a los 20 minutos y se presentan junto con cuatro o ms de los siguientes sntomas:  Latidos cardacos acelerados o frecuencia cardaca elevada (palpitaciones).  Sudoracin.  Temblores o sacudidas.  Dificultad para respirar o sensacin de asfixia.  Sensacin de [employer].  Dolor o Adult nurse.  Nuseas o sensacin extraa en el estmago.  Mareos, sensacin de desvanecimiento o de desmayo.  Escalofros o sofocos.  Hormigueos o adormecimiento en [place] y los pies.  Sensacin de [employer] no son reales o de que no es usted mismo.  Temor a perder el control o el juicio.  Temor a Corporate treasurer. Algunos de estos sntomas pueden parecerse a enfermedades graves. Por ejemplo, es posible que piense que tendr un ataque cardaco. Aunque las crisis de [place] pueden ser muy atemorizantes, no son potencialmente mortales. DIAGNSTICO  Las crisis de [place] se diagnostican con una evaluacin que realiza el mdico. Su mdico le realizar preguntas  sobre los sntomas, como cundo y dnde ocurrieron. Tambin le preguntar sobre su historia clnica y [place] consumo de alcohol y drogas, incluidos los medicamentos recetados. Es posible que su mdico le indique anlisis de sangre u otros estudios para Teacher, English as a foreign language graves. El mdico podr derivarlo a un profesional de la salud mental para que le realice una evaluacin ms profunda. TRATAMIENTO   En general, las personas sanas que registran una o dos crisis de [place] bajo una situacin extrema, potencialmente mortal, no requerirn Clinical research associate.  El [place] de las crisis de [place] asociadas con trastornos de ansiedad u otras enfermedades mentales, generalmente, requiere orientacin por parte de un profesional de la salud mental medicamentos, o bien la combinacin de [place]. Su mdico le ayudar a Leisure centre manager tratamiento para usted.  Las crisis de [place] asociadas a enfermedades fsicas, generalmente, desaparecen con el tratamiento de la enfermedad. Si un medicamento recetado le causa crisis de [place], consulte a su mdico si debe suspenderlo, disminuir la dosis o sustituirlo por otro medicamento.  Las crisis de [place] asociadas al consumo de drogas o alcohol desaparecen con la abstinencia. Algunos adultos necesitan ayuda profesional para dejar de beber o de consumir drogas. INSTRUCCIONES PARA EL CUIDADO EN EL HOGAR   Tome todos los medicamentos como le indic el mdico.  Planifique y concurra a todas las visitas de control, segn le indique el mdico. Es importante que concurra a todas las visitas. SOLICITE ATENCIN MDICA SI:  No puede tomar los [employer] se lo han indicado.  Los sntomas no mejoran o empeoran. SOLICITE ATENCIN MDICA DE INMEDIATO SI:   Experimenta sntomas de crisis de [place] diferentes de los que presenta habitualmente.  Tiene  pensamientos serios acerca de lastimarse a usted mismo o daar a Producer, television/film/video.  Toma medicamentos para  las crisis de [place] y presenta efectos secundarios graves. ASEGRESE DE QUE:  Comprende estas instrucciones.  Controlar su afeccin.  Recibir ayuda de inmediato si no mejora o si empeora. Document Released: 03/29/2005 Document Revised: 04/03/2013 [facility] Patient Information 2015 [place]. This information is not intended to replace advice given to you by your health care provider. Make sure you discuss any questions you have with your health care provider.

## 2014-11-26 DIAGNOSIS — H2513 Age-related nuclear cataract, bilateral: Secondary | ICD-10-CM | POA: Insufficient documentation

## 2014-11-26 DIAGNOSIS — H50012 Monocular esotropia, left eye: Secondary | ICD-10-CM | POA: Insufficient documentation

## 2014-11-26 DIAGNOSIS — H532 Diplopia: Secondary | ICD-10-CM | POA: Insufficient documentation

## 2017-06-16 ENCOUNTER — Ambulatory Visit: Payer: Self-pay | Admitting: Urgent Care

## 2018-06-05 ENCOUNTER — Ambulatory Visit (INDEPENDENT_AMBULATORY_CARE_PROVIDER_SITE_OTHER): Payer: Medicare Other | Admitting: Internal Medicine

## 2018-06-05 ENCOUNTER — Other Ambulatory Visit: Payer: Self-pay

## 2018-06-05 DIAGNOSIS — F419 Anxiety disorder, unspecified: Secondary | ICD-10-CM | POA: Diagnosis not present

## 2018-06-05 DIAGNOSIS — M1711 Unilateral primary osteoarthritis, right knee: Secondary | ICD-10-CM

## 2018-06-05 DIAGNOSIS — M25511 Pain in right shoulder: Secondary | ICD-10-CM | POA: Diagnosis not present

## 2018-06-05 DIAGNOSIS — I10 Essential (primary) hypertension: Secondary | ICD-10-CM | POA: Diagnosis not present

## 2018-06-05 NOTE — Patient Instructions (Addendum)
Michelle Carrillo,   Ordene una placa de su rodilla derecha, puede pasar por radiologia cuando desee para hacersela.   Hoy recibio una inyeccion de esteroides en la misma rodilla. Esperamos que su dolor disminuya en los proximos 2 o 3 dias despues de la inyeccion. Llamenos si le da fiebre, si le [employer], o si la rodilla se pone roja o caliente. estos son sintomas de infeccion y va a Solicitor.   Haga una cita de seguimiento en 4 semanas para que conozca a su doctor. Llamenos si tiene alguna pregunta o preocupacion.   - Dra. Frederico Hamman

## 2018-06-06 ENCOUNTER — Encounter: Payer: Self-pay | Admitting: Internal Medicine

## 2018-06-06 DIAGNOSIS — H509 Unspecified strabismus: Secondary | ICD-10-CM | POA: Insufficient documentation

## 2018-06-06 DIAGNOSIS — F329 Major depressive disorder, single episode, unspecified: Secondary | ICD-10-CM | POA: Insufficient documentation

## 2018-06-06 DIAGNOSIS — F32A Depression, unspecified: Secondary | ICD-10-CM | POA: Insufficient documentation

## 2018-06-06 DIAGNOSIS — B0233 Zoster keratitis: Secondary | ICD-10-CM | POA: Insufficient documentation

## 2018-06-06 DIAGNOSIS — M25511 Pain in right shoulder: Secondary | ICD-10-CM | POA: Insufficient documentation

## 2018-06-06 NOTE — Assessment & Plan Note (Addendum)
Patient reports a 20-year history of anxiety managed with SSRIs and benzodiazepines. She is currently on paroxetine 10 mg QD. Has not been on benzodiazepines for 2-3 years. GAD-7 not performed today, but she reports feeling well and not feeling symptoms of anxiety. Ideally would taper off paroxetine and start a different SSRI given short half life and increased risk of side effects/withdrawal symptoms of Paxil. Recommended her to discuss this with her PCP.

## 2018-06-06 NOTE — Assessment & Plan Note (Signed)
[name] presents with a 2-week history of intermittent R shoulder pain that she has difficulty describing. She is unable to identify alleviating and aggravating factors. Denies recent trauma, recent illness, infectious symptoms, weakness, changes in sensation, and radiation of pain. She is not having active pain today. On exam, she has full range of motion of R shoulder. Internal rotation of shoulder elicits minimal pain.   I suspect her acute R shoulder pain is secondary to muscle strain vs spasms. No radicular symptoms to suggest a nerve pathology. Low suspicion for septic joint. Recommended conservative management with NSAIDs and/or Tylenol and heat vs ice as needed for pain.

## 2018-06-06 NOTE — Assessment & Plan Note (Signed)
[name] has a history of chronic R knee pain secondary to primary OA. Her pain worsens throughout the day and improves with rest. She is able to complete ADLs independently but at a lower pace due to pain. Her pain appears to be stable, but she is requesting imaging to ensure there is no other pathology to be addressed. She has received numerous intra-articular injections in [place] for this, but does not recall which type of injection. From her description, it appears to be both steroid and hyaluronic acid injections. She is interested in continuing medical management with medications and intra-articular steroid injections as she would like to avoid surgical intervention.    POCUS of R knee did not reveal any acute abnormalities and a steroid injection was performed in clinic. Signs and symptoms of infection as well as return precautions were discussed. She was also advised to alternate NSAIDs and Tylenol for pain control if no pain relief from injection within the next 2-3 days.   Knee Steroid Injection Procedure Note:  Diagnosis: primary right knee OA Indications: chronic R knee pain  Anesthesia: Lidocaine 1% without epinephrine  Procedure Details  Point of care ultrasound was used to identify right knee structures and plan needle trajectory and depth. Consent was obtained for the procedure. The joint was prepped with Betadine. A 22 gauge needle was inserted into the superior aspect of the joint from a medial approach to access the suprapatellar pouch. 2 ml 1% lidocaine and 1 ml of Triamcinolone was then injected into the joint. The needle was removed and the area cleansed and dressed.  Complications:  None; patient tolerated the procedure well. Return precautions discussed.

## 2018-06-06 NOTE — Assessment & Plan Note (Signed)
Blood pressure is well controlled on lisinopril 20 mg QD. Will continue.

## 2018-06-06 NOTE — Progress Notes (Signed)
   CC: R shoulder and knee pain   HPI:  Ms.Floreen E Catalfamo is a 68 y.o. year-old female with PMH listed below who presents to clinic for evaluation of R shoulder and knee pain. Please see problem based assessment and plan for further details.   Past Medical History:  Hypertension  Depression  Anxiety  Herpes zoster of left eye  Recurrent chest pain, atypical - LHC in 2003 with no CAD   Past Surgical History:  C-section x2  Cholecystectomy  Corneal transplant of L eye in 2001 and 2015  Medications: Lisinopril 20 mg QD  Paroxetine 10 mgQD  Natural remedies - collagen and RM + Flex   Allergies: None   Family History:  Mother - diabetes, died of colon cancer at age 7 Father - HTN   Social History: Ms. Concha Se lives in [place] with her husband. She has four daughters and several grandchildren who help her at home and with errands. She is able to complete her ADLs and manage her medications. She travels to [place] frequently to visit family and, some times, for medical procedures and to buy medicines. She has never smoked and denies use of alcohol or drugs.    Review of Systems:   Review of Systems  Constitutional: Negative for chills, fever and malaise/fatigue.  Respiratory: Negative for shortness of breath.   Cardiovascular: Positive for leg swelling. Negative for chest pain and palpitations.  Musculoskeletal: Positive for joint pain. Negative for falls and myalgias.  Neurological: Negative for dizziness and headaches.    Physical Exam: Vitals:   06/05/18 1337 06/05/18 1400  BP: (!) 151/76 125/64  Pulse: (!) 101 87  Temp: 97.7 F (36.5 C)   TempSrc: Oral   Weight: 181 lb 4.8 oz (82.2 kg)    General: well-appearing female, pleasant, in no acute distress  HENT: NCAT, neck supple and FROM, OP clear without exudates or erythema Eyes: anicteric sclera, PERRL Cardiac: regular rate and rhythm, nl S1/S2, no murmurs, rubs or gallops Pulm: CTAB, no wheezes or  crackles, no increased work of breathing on room air  Abd: soft, NTND, bowel sounds are normoactive   Neuro: A&Ox3, able to move all 4 extremities purposefully and spontaneously with no focal deficits noted  Ext: warm and well perfused with trace edema bilaterally  MSK: R shoulder appears grossly normal. She has full range of motion on both active and passive movement. She does have mild pain on internal rotation. L shoulder is grossly normal with FROM. R knee also appears grossly normal without erythema or warmth. Crepitus noted. No effusions palpated. FROM with no pain on passive or active movement.    Assessment & Plan:   See Encounters Tab for problem based charting.  Patient seen with Dr. Angelia Mould

## 2018-06-07 NOTE — Progress Notes (Addendum)
Internal Medicine Clinic Attending  I saw and evaluated the patient.  I personally confirmed the key portions of the history and exam documented by Dr. Isac Sarna and I reviewed pertinent patient test results.  The assessment, diagnosis, and plan were formulated together and I agree with the documentation in the resident's note.    I was present for the entire procedure.

## 2018-06-23 ENCOUNTER — Other Ambulatory Visit: Payer: Self-pay | Admitting: Internal Medicine

## 2018-06-23 NOTE — Telephone Encounter (Signed)
Last OV 06/05/18, office note states pt to consider weaning off paxil.  Next OV 07/07/18 Will forward to PCP to advise on refill. Thanks, SChaplin, RN,BSN

## 2018-06-23 NOTE — Telephone Encounter (Signed)
Needs refill on PARoxetine (PAXIL) 10 MG tablet lisinopril (PRINIVIL,ZESTRIL) 20 MG tablet  [place], [place] - [address] ; pt contact [phone]

## 2018-06-25 MED ORDER — LISINOPRIL 20 MG PO TABS: 20.0000 mg | ORAL_TABLET | Freq: Every day | ORAL | 6 refills | Status: DC

## 2018-06-25 MED ORDER — PAROXETINE HCL 10 MG PO TABS: 10.0000 mg | ORAL_TABLET | Freq: Every day | ORAL | 1 refills | Status: DC

## 2018-07-07 ENCOUNTER — Ambulatory Visit (INDEPENDENT_AMBULATORY_CARE_PROVIDER_SITE_OTHER): Payer: Medicare Other | Admitting: Internal Medicine

## 2018-07-07 ENCOUNTER — Other Ambulatory Visit: Payer: Self-pay

## 2018-07-07 ENCOUNTER — Other Ambulatory Visit: Payer: Self-pay | Admitting: Internal Medicine

## 2018-07-07 ENCOUNTER — Encounter: Payer: Self-pay | Admitting: Internal Medicine

## 2018-07-07 DIAGNOSIS — L989 Disorder of the skin and subcutaneous tissue, unspecified: Secondary | ICD-10-CM | POA: Diagnosis not present

## 2018-07-07 DIAGNOSIS — M1711 Unilateral primary osteoarthritis, right knee: Secondary | ICD-10-CM

## 2018-07-07 DIAGNOSIS — F419 Anxiety disorder, unspecified: Secondary | ICD-10-CM | POA: Diagnosis not present

## 2018-07-07 DIAGNOSIS — I10 Essential (primary) hypertension: Secondary | ICD-10-CM

## 2018-07-07 MED ORDER — PAROXETINE HCL 20 MG PO TABS: 20.0000 mg | ORAL_TABLET | Freq: Every day | ORAL | 2 refills | Status: DC

## 2018-07-07 NOTE — Assessment & Plan Note (Signed)
Patient does not check her BP on a daily basis. Last checked 2 days ago and states it was 123/60s. Will continue lisinopril 20 mg QD.

## 2018-07-07 NOTE — Assessment & Plan Note (Signed)
Michelle Carrillo complains of a painful lesion on the plantar surface of the L foot (near her ankle) that has been present "for a while/months." She denies associated symptoms such as rash or systemic symptoms. No changes in size of lesion. She reports Ibuprofen and Tylenol alleviate the pain. She is requesting imaging for further evaluation.   I am not sure what this lesion could be as this is a telehealth visit and I am unable to examine her. However, I recommended against imaging at this time (non-emergent medical problem and risks of coronavirus exposure outweigh benefits of imaging at this time) and discussed continuing Ibuprofen and Tylenol to manage pain until she can come to clinic for a follow up visit.

## 2018-07-07 NOTE — Assessment & Plan Note (Deleted)
Patient

## 2018-07-07 NOTE — Telephone Encounter (Signed)
Has refills at pharmacy, just picked up 3/15, should not need refills at this time. Pt informed, she says ok, bye.

## 2018-07-07 NOTE — Assessment & Plan Note (Signed)
Patient reports she has been feeling anxious recently due to one of her grandsons being sick. She has relied on support from her family during this time and states she has been coping well. She is compliant paroxetine. She states she is on 20 mg instead of 10 mg. Has been taking two 10 mg tablets.  Will continue current regimen. Sent prescription for paroxetine 20 mg QD.

## 2018-07-07 NOTE — Telephone Encounter (Signed)
Refill Request   lisinopril (PRINIVIL,ZESTRIL) 20 MG tablet    PARoxetine (PAXIL) 10 MG tablet     [facility] [place], [place] - [address]

## 2018-07-07 NOTE — Assessment & Plan Note (Signed)
Patient reports pain relief after R knee steroid injection 1 month ago. She has been able to do her chores at home and walk without difficulty. Will continue to monitor.

## 2018-07-07 NOTE — Progress Notes (Signed)
   CC: L foot lesion and follow up of BP and anxiety/depression   HPI:  Michelle Carrillo is a 68 y.o. year-old female with PMH listed below who presents to clinic for L foot lesion and follow up of HTN and depression/anxiety. Please see problem based assessment and plan for further details.   This is a telephone encounter between YAA DONNELLAN and Chirsty Armistead Santos-Sanchez on 07/07/2018 for a follow up visit. The visit was conducted with the patient located at home and Welford Roche at [facility]. The patient's identity was confirmed using their DOB and current address. The patient has consented to being evaluated through a telephone encounter and understands the associated risks (an examination cannot be done and the patient may need to come in for an appointment) / benefits (allows the patient to remain at home, decreasing exposure to coronavirus). I personally spent 26 minutes on medical discussion.    Past Medical History:  Diagnosis Date  . Anxiety   . Depression   . Hypertension   . Right knee osteoarthritis   . Strabismus     Review of Systems:   Review of Systems  Constitutional: Negative for chills, fever and malaise/fatigue.  HENT: Negative for congestion and sore throat.   Respiratory: Negative for cough and shortness of breath.   Cardiovascular: Negative for chest pain, palpitations and leg swelling.  Gastrointestinal: Negative for abdominal pain, constipation, diarrhea, nausea and vomiting.  Musculoskeletal: Positive for joint pain. Negative for myalgias.    Physical Exam: This was a telehealth visit. A physical exam was not performed.     Assessment & Plan:   See Encounters Tab for problem based charting.   Patient discussed with Dr. Lynnae January

## 2018-07-10 NOTE — Progress Notes (Signed)
Internal Medicine Clinic Attending  Case discussed with Dr. Santos-Sanchez at the time of the visit.  We reviewed the resident's history and exam and pertinent patient test results.  I agree with the assessment, diagnosis, and plan of care documented in the resident's note.    

## 2018-11-28 ENCOUNTER — Other Ambulatory Visit: Payer: Self-pay

## 2018-11-28 ENCOUNTER — Ambulatory Visit (INDEPENDENT_AMBULATORY_CARE_PROVIDER_SITE_OTHER): Payer: Medicare Other | Admitting: Internal Medicine

## 2018-11-28 VITALS — BP 186/81 | HR 84 | Temp 98.4°F | Ht 61.0 in | Wt 184.8 lb

## 2018-11-28 DIAGNOSIS — G8929 Other chronic pain: Secondary | ICD-10-CM

## 2018-11-28 DIAGNOSIS — M1711 Unilateral primary osteoarthritis, right knee: Secondary | ICD-10-CM | POA: Diagnosis not present

## 2018-11-28 NOTE — Progress Notes (Signed)
   CC: knee pain  HPI:  [name] is a 68 y.o. Spanish speaking female who presents for progressive right knee pain. Per patient's request, visit was conducted with help of her daughter as Optometrist. Please see problem based charting for further details.   Past Medical History:  Diagnosis Date  . Anxiety   . Depression   . Herpes zoster kerstitis s/p corneal transplant 2015   . Hypertension   . Right knee osteoarthritis   . Strabismus    Review of Systems:   Constitutional: negative for fevers, chills Skin: negative for rash Neuro: negative for numbness/tingling, weakness MSK: negative for other joint pain or swelling   Physical Exam:  Vitals:   11/28/18 1320  BP: (!) 186/81  Pulse: 84  Temp: 98.4 F (36.9 C)  TempSrc: Oral  SpO2: 96%  Weight: 184 lb 12.8 oz (83.8 kg)  Height: 5\' 1"  (1.549 m)   General: alert, well-appearing female in NAD Neuro: A&Ox3; no focal deficits  MSK: lower extremities without valgus or varus deformities; right knee appears grossly normal without erythema or warmth. No palpable effusion. Full active ROM. Crepitus noted with passive ROM of patellofemoral joint. Ligaments intact. No meniscal signs.   Assessment & Plan:   See Encounters Tab for problem based charting.  Patient discussed with Dr. Evette Doffing

## 2018-11-28 NOTE — Patient Instructions (Signed)
[name], Fue un placer conocerte. Hoy le pusimos una inyeccin para Conservation officer, historic buildings de rodilla que espero le alivie un poco.  Esta noche puedes poner hielo en la rodilla, ya que puede que te duela un poco. Contine tomando ibuprofeno segn sea necesario. Haga un seguimiento con su mdico de cabecera en 1 mes para ver cmo le est yendo y considere derivarlo a un cirujano ortopdico si desea someterse a Marine scientist.   Asegrese de hacerse una radiografa de la rodilla para que podamos ver cunta artritis hay en la articulacin.  Cudate, Dr. Koleen Distance

## 2018-11-29 ENCOUNTER — Encounter: Payer: Self-pay | Admitting: Internal Medicine

## 2018-11-29 NOTE — Progress Notes (Addendum)
Knee Steroid Injection Procedure Note  Diagnosis: right knee OA  Indications: Symptom relief from osteoarthritis  Anesthesia: Lidocaine 1% without epinephrine  Procedure Details   Point of care ultrasound was used to identify right knee structures and plan needle trajectory and depth. Consent was obtained for the procedure. The joint was prepped with Betadine. A 21 gauge needle was inserted into the superior aspect of the joint from a medial approach to access the suprapatellar pouch. 2 ml 1% lidocaine and 40 mg of Triamcinolone was then injected into the joint through the same needle. The needle was removed and the area cleansed and dressed.  Complications:  None; patient tolerated the procedure well.

## 2018-11-29 NOTE — Assessment & Plan Note (Signed)
Patient presents for progressively worsening right knee pain over the last month described as a constant throbbing. Also states the knee pops often. Pain is worse with ambulation and rising from a chair. Occasionally feels like it's going to give out on her, but she has never fallen as a result of her knee pain. She has been taking Ibuprofen with mild relief. She has a longstanding history of OA in her knee. When she was living in [place], she was told that it would need to be replaced. She has been hesitant to get surgery and has been managing with intra-articular steroid injections. Her most recent injection was in February which gave her relief up until a month ago. Requesting repeat injection today. Imaging had been ordered, but was never done. Will obtain plain films to further evaluate joint. Steroid injection performed today in clinic. Continue supportive care with anti-inflammatories and icing as needed. Follow-up with PCP in a few months to discuss possible ortho referral if she is ready to pursue surgical intervention.

## 2018-11-30 NOTE — Progress Notes (Signed)
Internal Medicine Clinic Attending  I saw and evaluated the patient.  I personally confirmed the key portions of the history and exam documented by Dr. Koleen Distance and I reviewed pertinent patient test results.  The assessment, diagnosis, and plan were formulated together and I agree with the documentation in the resident's note. I was present for the entirety of the procedure.

## 2018-12-14 DIAGNOSIS — H5005 Alternating esotropia: Secondary | ICD-10-CM | POA: Diagnosis not present

## 2018-12-14 DIAGNOSIS — Z947 Corneal transplant status: Secondary | ICD-10-CM | POA: Diagnosis not present

## 2018-12-14 DIAGNOSIS — T86841 Corneal transplant failure: Secondary | ICD-10-CM | POA: Diagnosis not present

## 2018-12-26 ENCOUNTER — Encounter: Payer: Self-pay | Admitting: Internal Medicine

## 2018-12-26 ENCOUNTER — Ambulatory Visit: Payer: Medicare Other

## 2019-01-12 ENCOUNTER — Ambulatory Visit (HOSPITAL_COMMUNITY)
Admission: RE | Admit: 2019-01-12 | Discharge: 2019-01-12 | Disposition: A | Discharge status: Home / Self Care | Payer: Medicare Other | Source: Ambulatory Visit | Attending: Internal Medicine | Admitting: Internal Medicine

## 2019-01-12 ENCOUNTER — Other Ambulatory Visit: Payer: Self-pay

## 2019-01-12 DIAGNOSIS — M1711 Unilateral primary osteoarthritis, right knee: Secondary | ICD-10-CM

## 2019-01-18 ENCOUNTER — Telehealth: Payer: Self-pay

## 2019-01-18 NOTE — Telephone Encounter (Signed)
Requesting x-ray results. Please call pt back.  

## 2019-01-25 NOTE — Telephone Encounter (Signed)
Patient called back. Discussed results with her.

## 2019-01-25 NOTE — Telephone Encounter (Signed)
Called patient back, no answer. Left message to call back. When she calls, can let her know that her XR only showed mild to moderate osteoarthritis which we know she has. No other abnormalities seen.

## 2019-01-25 NOTE — Telephone Encounter (Signed)
Requesting X-ray results, please call pt back.

## 2019-01-27 ENCOUNTER — Other Ambulatory Visit: Payer: Self-pay | Admitting: Internal Medicine

## 2019-01-30 NOTE — Telephone Encounter (Signed)
Patient needs appt for HTN follow up./

## 2019-01-30 NOTE — Telephone Encounter (Signed)
Please schedule pt a f/u appt for HTN per Dr Isac Sarna. Thanks

## 2019-01-31 ENCOUNTER — Other Ambulatory Visit: Payer: Self-pay | Admitting: Internal Medicine

## 2019-02-12 ENCOUNTER — Encounter: Payer: Medicare Other | Admitting: Internal Medicine

## 2019-02-14 DIAGNOSIS — H50012 Monocular esotropia, left eye: Secondary | ICD-10-CM | POA: Diagnosis not present

## 2019-02-14 DIAGNOSIS — H532 Diplopia: Secondary | ICD-10-CM | POA: Diagnosis not present

## 2019-02-19 ENCOUNTER — Ambulatory Visit: Payer: Medicare Other

## 2019-02-19 ENCOUNTER — Encounter: Payer: Self-pay | Admitting: Internal Medicine

## 2019-03-07 ENCOUNTER — Other Ambulatory Visit: Payer: Self-pay | Admitting: Internal Medicine

## 2019-03-12 ENCOUNTER — Other Ambulatory Visit: Payer: Self-pay | Admitting: Internal Medicine

## 2019-03-12 DIAGNOSIS — I10 Essential (primary) hypertension: Secondary | ICD-10-CM

## 2019-03-12 MED ORDER — LISINOPRIL 20 MG PO TABS: 20.0000 mg | ORAL_TABLET | Freq: Every day | ORAL | 1 refills | Status: DC

## 2019-03-12 NOTE — Telephone Encounter (Signed)
Refilled for 2 months only. Need BMP to check renal function if wants more refills. Ordered place. Can stop by the lab whenever is convenient for her.

## 2019-03-27 ENCOUNTER — Ambulatory Visit (INDEPENDENT_AMBULATORY_CARE_PROVIDER_SITE_OTHER): Payer: Medicare Other | Admitting: Internal Medicine

## 2019-03-27 ENCOUNTER — Encounter: Payer: Self-pay | Admitting: Internal Medicine

## 2019-03-27 ENCOUNTER — Other Ambulatory Visit: Payer: Medicare Other

## 2019-03-27 VITALS — BP 149/71 | HR 87 | Temp 98.2°F | Wt 182.6 lb

## 2019-03-27 DIAGNOSIS — M1711 Unilateral primary osteoarthritis, right knee: Secondary | ICD-10-CM | POA: Diagnosis not present

## 2019-03-27 DIAGNOSIS — H50012 Monocular esotropia, left eye: Secondary | ICD-10-CM | POA: Diagnosis not present

## 2019-03-27 DIAGNOSIS — I10 Essential (primary) hypertension: Secondary | ICD-10-CM

## 2019-03-27 NOTE — Assessment & Plan Note (Signed)
Patient is on lisinopril 20 mg daily for treatment of essential hypertension.  BP mildly elevated today at 149/71.  I suspect this is secondary to pain due to right knee osteoarthritis and will therefore not adjust her antihypertensive regimen.  Continue current regimen.  BMP order to check renal function as she is on ACEi.

## 2019-03-27 NOTE — Assessment & Plan Note (Signed)
Patient presents for follow-up of right knee osteoarthritis.  She reports she has been having increased pain in the right knee that is limiting ambulation.  She started taking an OTC natural supplement that has been helping with the pain.  Has received 2 steroid injections in the right knee, most recent one in 11/2018.  First injection seemed to help, but second one did not.  She is requesting referral to orthopedic surgery for knee replacement evaluation.  I think she would be a good candidate for it she is physically active and does not have any underlying cardiac or pulmonary problems.  Orthopedic surgery referral placed.

## 2019-03-27 NOTE — Progress Notes (Signed)
Internal Medicine Clinic Attending  Case discussed with Dr. Santos-Sanchez at the time of the visit.  We reviewed the resident's history and exam and pertinent patient test results.  I agree with the assessment, diagnosis, and plan of care documented in the resident's note.    

## 2019-03-27 NOTE — Patient Instructions (Addendum)
Gilman Schmidt,   Le hice un referido al ortopeda para que la evaluen para un remplazo de la rodilla. La deben estar llamando en 1 semana para sacar cita. Si no la llaman, dejenos saber.   Hoy checamos sus electrolitos y riones. La llamaremos con los Gower.   Haga cita de seguimiento conmigo en 6 meses.   - Dra. Frederico Hamman

## 2019-03-27 NOTE — Progress Notes (Signed)
   CC: R knee OA  HPI:  Ms.Giulia E Delatorre is a 68 y.o. year-old female with PMH listed below who presents to clinic for R knee OA. Please see problem based assessment and plan for further details.   Past Medical History:  Diagnosis Date  . Anxiety   . Depression   . Herpes zoster kerstitis s/p corneal transplant 2015   . Hypertension   . Right knee osteoarthritis   . Strabismus    Review of Systems:   Review of Systems  Constitutional: Negative for chills, fever, malaise/fatigue and weight loss.  Musculoskeletal: Positive for joint pain. Negative for back pain, falls and myalgias.    Physical Exam:  Vitals:   03/27/19 0909 03/27/19 0931  BP: (!) 150/72 (!) 149/71  Pulse: 96 87  Temp: 98.2 F (36.8 C)   TempSrc: Rectal   SpO2: 97%   Weight: 182 lb 9.6 oz (82.8 kg)     General: Elderly female, well-appearing, in NAD MSK: No gross abnormalities noted, range of motion mildly limited by pain   Assessment & Plan:   See Encounters Tab for problem based charting.  Patient discussed with Dr. Philipp Ovens

## 2019-03-28 LAB — BMP8+ANION GAP
Anion Gap: 12 mmol/L (ref 10.0–18.0)
BUN/Creatinine Ratio: 16 (ref 12–28)
BUN: 11 mg/dL (ref 8–27)
CO2: 25 mmol/L (ref 20–29)
Calcium: 9.2 mg/dL (ref 8.7–10.3)
Chloride: 103 mmol/L (ref 96–106)
Creatinine, Ser: 0.67 mg/dL (ref 0.57–1.00)
GFR calc Af Amer: 106 mL/min/{1.73_m2} (ref >59)
GFR calc non Af Amer: 92 mL/min/{1.73_m2} (ref >59)
Glucose: 95 mg/dL (ref 65–99)
Potassium: 4.3 mmol/L (ref 3.5–5.2)
Sodium: 140 mmol/L (ref 134–144)

## 2019-03-30 DIAGNOSIS — H532 Diplopia: Secondary | ICD-10-CM | POA: Diagnosis not present

## 2019-03-30 DIAGNOSIS — F419 Anxiety disorder, unspecified: Secondary | ICD-10-CM | POA: Diagnosis not present

## 2019-03-30 DIAGNOSIS — I10 Essential (primary) hypertension: Secondary | ICD-10-CM | POA: Diagnosis not present

## 2019-03-30 DIAGNOSIS — H5021 Vertical strabismus, right eye: Secondary | ICD-10-CM | POA: Diagnosis not present

## 2019-03-30 DIAGNOSIS — M1990 Unspecified osteoarthritis, unspecified site: Secondary | ICD-10-CM | POA: Diagnosis not present

## 2019-03-30 DIAGNOSIS — H50012 Monocular esotropia, left eye: Secondary | ICD-10-CM | POA: Diagnosis not present

## 2019-03-30 DIAGNOSIS — Z79899 Other long term (current) drug therapy: Secondary | ICD-10-CM | POA: Diagnosis not present

## 2019-03-31 DIAGNOSIS — Z9889 Other specified postprocedural states: Secondary | ICD-10-CM

## 2019-03-31 DIAGNOSIS — H5022 Vertical strabismus, left eye: Secondary | ICD-10-CM | POA: Insufficient documentation

## 2019-03-31 HISTORY — DX: Other specified postprocedural states: Z98.890

## 2019-04-03 ENCOUNTER — Ambulatory Visit (INDEPENDENT_AMBULATORY_CARE_PROVIDER_SITE_OTHER): Payer: Medicaid Other | Admitting: Family Medicine

## 2019-04-03 ENCOUNTER — Encounter: Payer: Self-pay | Admitting: Family Medicine

## 2019-04-03 ENCOUNTER — Other Ambulatory Visit: Payer: Self-pay

## 2019-04-03 DIAGNOSIS — G8929 Other chronic pain: Secondary | ICD-10-CM | POA: Diagnosis not present

## 2019-04-03 DIAGNOSIS — B351 Tinea unguium: Secondary | ICD-10-CM | POA: Insufficient documentation

## 2019-04-03 DIAGNOSIS — M25561 Pain in right knee: Secondary | ICD-10-CM | POA: Diagnosis not present

## 2019-04-03 MED ORDER — MELOXICAM 15 MG PO TABS: 7.5000 mg | ORAL_TABLET | Freq: Every day | ORAL | 6 refills | Status: DC | PRN

## 2019-04-03 MED ORDER — GLUCOSAMINE SULFATE 1000 MG PO CAPS: 1.0000 | ORAL_CAPSULE | Freq: Two times a day (BID) | ORAL | 3 refills | Status: DC

## 2019-04-03 NOTE — Progress Notes (Signed)
Michelle Carrillo - 68 y.o. female MRN [id number]  Date of birth: Jun 09, 1950  Office Visit Note: Visit Date: 04/03/2019 PCP: Welford Roche, MD Referred by: Velna Ochs, MD  Subjective: Chief Complaint  Patient presents with  . Right Knee - Pain    Chronic right knee pain (approximately 10 months). No recent injury (did have a fall 10 years ago). Pain anterior and posterior knee. Knee gives way. Popping. Swelling.   HPI: Michelle Carrillo is a 68 y.o. female who comes in today with right knee pain for the past 10 months. She reports that she feels pain in medial knee with prolonged walking or when she is going from sitting to standing.  She also has pain in posterior knee. Pain is limiting ambulation. Her knee occasionally gives away and "feels like its breaking" on the medial side. She also reports swelling. She has tried a Psychologist, prison and probation services herb and ibuprofen with minimal relief. She had two injections in knee, most recently in August 2020. The first injection helped and the second one did not provide relief.    ROS Otherwise per HPI.  Assessment & Plan: Visit Diagnoses:  1. Chronic pain of right knee     Plan: Medial joint pain in right knee with severe medial compartment DJD on x-ray. Patient is interested in knee replacement; placed referral to Dr. Marlou Sa for consultation. Patient elected for corticosteroid injection today in right knee. Will start process for prior approval for gel injections to use when effect of steroid wears off. Also recommended glucosamine for arthritis and trial of meloxicam.  Meds & Orders:  Meds ordered this encounter  Medications  . meloxicam (MOBIC) 15 MG tablet    Sig: Take 0.5-1 tablets (7.5-15 mg total) by mouth daily as needed for pain.    Dispense:  30 tablet    Refill:  6  . Glucosamine Sulfate 1000 MG CAPS    Sig: Take 1 capsule (1,000 mg total) by mouth 2 (two) times daily.    Dispense:  180 capsule    Refill:  3   No orders of  the defined types were placed in this encounter.   Follow-up: Return for To se Dr. Marlou Sa for knee replacement consultation..   Procedures: Procedure performed: knee intraarticular corticosteroid injection; palpation guided  Consent obtained and verified. Time-out conducted. Noted no overlying erythema, induration, or other signs of local infection. The right superior-lateral joint space was palpated and marked. The overlying skin was prepped in a sterile fashion. Topical analgesic spray: Ethyl chloride. Joint: right knee Needle: 25 gauge, 1.5 inch Completed without difficulty. Meds: 40 mg methylprednisolone, 4 ml 1% lidocaine without epinephrine   Clinical History: No specialty comments available.   She reports that she has never smoked. She has never used smokeless tobacco. No results for input(s): HGBA1C, LABURIC in the last 8760 hours.  Objective:  VS:  HT:    WT:   BMI:     BP:   HR: bpm  TEMP: ( )  RESP:  Physical Exam  PHYSICAL EXAM: Gen: NAD, alert, cooperative with exam, well-appearing HEENT: clear conjunctiva,  CV:  no edema, capillary refill brisk, normal rate Resp: non-labored Skin: no rashes, normal turgor  Neuro: no gross deficits.  Psych:  alert and oriented  Ortho Exam  Right Knee: - Inspection: large knee, mild effusion  - Palpation: TTP over medial joint line - ROM: full active ROM with flexion and extension in knee and hip - Strength: 5/5 strength - Neuro/vasc: NV  intact - LIGAMENTS: negative anterior and posterior drawer, some laxity with valgus stress, no pain    Left knee: - Inspection:  Large knee   - Palpation: TTP over medial joint line - ROM: full active ROM with flexion and extension in knee and hip - Strength: 5/5 strength - Neuro/vasc: NV intact - LIGAMENTS: negative anterior and posterior drawer, some laxity with valgus stress, no pain    Imaging: Reviewed x-rays of right from 10/2, show severe medial compartment DJD and moderate  patellofemoral DJD  Past Medical/Family/Surgical/Social History: Medications & Allergies reviewed per EMR, new medications updated. Patient Active Problem List   Diagnosis Date Noted  . Nail fungus 04/03/2019  . Hypotropia of left eye 03/31/2019  . History of strabismus surgery 03/31/2019  . Left foot lesion 07/07/2018  . Depression 06/06/2018  . Right shoulder pain 06/06/2018  . Strabismus 06/06/2018  . 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 Osteoarthritis of right knee 06/05/2018  . Essential hypertension 06/05/2018  . Anxiety 06/05/2018  . Age-related nuclear cataract of both eyes 11/26/2014  . Diplopia 11/26/2014  . Monocular esotropia of left eye 11/26/2014  . H/O herpes zoster 01/04/2014  . Corneal transplant failure 10/27/2011   Past Medical History:  Diagnosis Date  . Anxiety   . Depression   . Herpes zoster kerstitis s/p corneal transplant 2015   . Hypertension   . Right knee osteoarthritis   . Strabismus    History reviewed. No pertinent family history. History reviewed. No pertinent surgical history. Social History   Occupational History  . Not on file  Tobacco Use  . Smoking status: Never Smoker  . Smokeless tobacco: Never Used  Substance and Sexual Activity  . Alcohol use: Never  . Drug use: Never  . Sexual activity: Not on file

## 2019-04-03 NOTE — Progress Notes (Signed)
Noted. Will submit after 04/16/2019.

## 2019-04-03 NOTE — Progress Notes (Signed)
I saw and examined the patient with Dr. Mayer Masker and agree with assessment and plan as outlined.    End-stage medial compartment DJD.  Has had two steroid injections with short-term relief.  Interested in consulting for knee replacement.  Will also inject once more with steroid today, and request approval for gel injections.

## 2019-04-24 ENCOUNTER — Telehealth: Payer: Self-pay

## 2019-04-24 NOTE — Telephone Encounter (Signed)
Submitted VOB for Monovisc, right knee. 

## 2019-04-25 ENCOUNTER — Ambulatory Visit: Payer: Medicare Other | Admitting: Orthopedic Surgery

## 2019-05-01 ENCOUNTER — Telehealth: Payer: Self-pay

## 2019-05-01 NOTE — Telephone Encounter (Signed)
Patient aware that she is approved for gel injection.  Approved for Monovisc, right knee. Buy & Bill Must meet Medicare deductible first Patient will be responsible for 20% OOP. No Co-pay No PA required  Appt. 05/08/2019 with Dr. Junius Roads

## 2019-05-08 ENCOUNTER — Ambulatory Visit: Payer: Medicare Other | Admitting: Family Medicine

## 2019-05-09 ENCOUNTER — Telehealth: Payer: Self-pay | Admitting: Internal Medicine

## 2019-05-09 NOTE — Telephone Encounter (Signed)
Pt's daughter requesting a call back in reference to the patient's test results.

## 2019-05-11 NOTE — Telephone Encounter (Signed)
Called patient and discussed results

## 2019-06-13 ENCOUNTER — Other Ambulatory Visit: Payer: Self-pay | Admitting: Internal Medicine

## 2019-06-14 ENCOUNTER — Ambulatory Visit: Payer: Medicare Other | Attending: Family

## 2019-06-14 DIAGNOSIS — Z23 Encounter for immunization: Secondary | ICD-10-CM | POA: Insufficient documentation

## 2019-06-14 NOTE — Progress Notes (Signed)
   Covid-19 Vaccination Clinic  Name:  Michelle Carrillo    MRN: [id number] DOB: [birth year]  06/14/2019  Michelle Carrillo was observed post Covid-19 immunization for 15 minutes without incident. She was provided with Vaccine Information Sheet and instruction to access the V-Safe system.   Michelle Carrillo was instructed to call 911 with any severe reactions post vaccine: Marland Kitchen Difficulty breathing  . Swelling of face and throat  . A fast heartbeat  . A bad rash all over body  . Dizziness and weakness   Immunizations Administered    Name Date Dose VIS Date Route   Moderna COVID-19 Vaccine 06/14/2019  3:56 PM 0.5 mL 03/13/2019 Intramuscular   Manufacturer: Moderna   Lot: [id number]   [place][id number]

## 2019-06-20 ENCOUNTER — Ambulatory Visit (INDEPENDENT_AMBULATORY_CARE_PROVIDER_SITE_OTHER): Payer: Medicare Other | Admitting: Internal Medicine

## 2019-06-20 ENCOUNTER — Other Ambulatory Visit (HOSPITAL_COMMUNITY)
Admission: RE | Admit: 2019-06-20 | Discharge: 2019-06-20 | Disposition: A | Discharge status: Home / Self Care | Payer: Medicare Other | Source: Ambulatory Visit | Attending: Internal Medicine | Admitting: Internal Medicine

## 2019-06-20 ENCOUNTER — Other Ambulatory Visit: Payer: Self-pay

## 2019-06-20 ENCOUNTER — Encounter: Payer: Self-pay | Admitting: Internal Medicine

## 2019-06-20 VITALS — BP 149/74 | HR 91 | Wt 184.5 lb

## 2019-06-20 DIAGNOSIS — Z0000 Encounter for general adult medical examination without abnormal findings: Secondary | ICD-10-CM

## 2019-06-20 DIAGNOSIS — H50012 Monocular esotropia, left eye: Secondary | ICD-10-CM

## 2019-06-20 DIAGNOSIS — Z8049 Family history of malignant neoplasm of other genital organs: Secondary | ICD-10-CM | POA: Insufficient documentation

## 2019-06-20 DIAGNOSIS — Z124 Encounter for screening for malignant neoplasm of cervix: Secondary | ICD-10-CM | POA: Insufficient documentation

## 2019-06-20 DIAGNOSIS — Z780 Asymptomatic menopausal state: Secondary | ICD-10-CM | POA: Diagnosis not present

## 2019-06-20 DIAGNOSIS — Z1151 Encounter for screening for human papillomavirus (HPV): Secondary | ICD-10-CM | POA: Diagnosis not present

## 2019-06-20 DIAGNOSIS — Z1231 Encounter for screening mammogram for malignant neoplasm of breast: Secondary | ICD-10-CM

## 2019-06-20 DIAGNOSIS — Z13820 Encounter for screening for osteoporosis: Secondary | ICD-10-CM

## 2019-06-20 HISTORY — DX: Encounter for general adult medical examination without abnormal findings: Z00.00

## 2019-06-20 NOTE — Patient Instructions (Addendum)
Hoy le hicimos un papanicolao. La llamaremos con [employer].   Tambien ordenamos un mamograma, colonoscopia, y Ardelia Mems prueba de osteoporosis. Cuando haga la cita del mamograma dejele saber la fecha de su segunda vacuna del covid.   Le hice un referido al doctor de los ojos para lentes nuevos.   - Dr. Frederico Hamman

## 2019-06-21 LAB — CYTOLOGY - PAP
Adequacy: ABSENT
Comment: NEGATIVE
Diagnosis: NEGATIVE
High risk HPV: NEGATIVE

## 2019-06-22 ENCOUNTER — Other Ambulatory Visit: Payer: Self-pay | Admitting: Internal Medicine

## 2019-06-22 NOTE — Assessment & Plan Note (Signed)
Patient has a history of ophthalmologic problems, mainly in the L eye, and is requesting a referral to ophthalmologist to establish care in the area. Referral placed.

## 2019-06-22 NOTE — Progress Notes (Signed)
   CC: Pap smear   HPI:  [name] is a 69 y.o. year-old female with PMH listed below who presents to clinic for pap smear. Please see problem based assessment and plan for further details.   Past Medical History:  Diagnosis Date  . Anxiety   . Depression   . Herpes zoster kerstitis s/p corneal transplant 2015   . Hypertension   . Right knee osteoarthritis   . Strabismus    Review of Systems:   Review of Systems  Constitutional: Negative for chills, fever, malaise/fatigue and weight loss.  Gastrointestinal: Negative for abdominal pain, blood in stool, constipation, diarrhea and melena.  Genitourinary: Negative for dysuria, flank pain, frequency, hematuria and urgency.    Physical Exam:  Vitals:   06/20/19 1604  BP: (!) 149/74  Pulse: 91  SpO2: 95%  Weight: 184 lb 8 oz (83.7 kg)    General: well appearing female in NAD  GU: normal external genitalia, cervix was unremarkable, minimal amount of white discharge, no lesions noted    Assessment & Plan:   See Encounters Tab for problem based charting.  Patient discussed with Dr. Dareen Piano

## 2019-06-22 NOTE — Assessment & Plan Note (Signed)
Patient presents requesting a PAP smear. States she has a family history of uterine cancer in her mother and would like to be checked today. She is post-menopausal and has not experience any vaginal bleeding. Last pap smear was 3 years ago in [place]. She reports it was normal. Does not recall the physician's name and unable to obtain records at this time. Will therefore perform pap smear today. Discussed with patient if results are normal, she will not need further pap smears unless she experiences new vaginal bleeding.

## 2019-06-22 NOTE — Progress Notes (Signed)
Internal Medicine Clinic Attending  Case discussed with Dr. Santos-Sanchez at the time of the visit.  We reviewed the resident's history and exam and pertinent patient test results.  I agree with the assessment, diagnosis, and plan of care documented in the resident's note.    

## 2019-06-22 NOTE — Assessment & Plan Note (Signed)
Ordered screening, bilateral mammogram (to be done a few weeks after 2nd COVID vaccine) and DEXA scan. Also placed GI referral for colonosocopy as she has never had one before. No GI symptoms at this time.

## 2019-06-25 ENCOUNTER — Other Ambulatory Visit: Payer: Self-pay | Admitting: Internal Medicine

## 2019-07-09 DIAGNOSIS — H35363 Drusen (degenerative) of macula, bilateral: Secondary | ICD-10-CM | POA: Diagnosis not present

## 2019-07-09 DIAGNOSIS — H35033 Hypertensive retinopathy, bilateral: Secondary | ICD-10-CM | POA: Diagnosis not present

## 2019-07-09 DIAGNOSIS — Z947 Corneal transplant status: Secondary | ICD-10-CM | POA: Diagnosis not present

## 2019-07-09 DIAGNOSIS — H35013 Changes in retinal vascular appearance, bilateral: Secondary | ICD-10-CM | POA: Diagnosis not present

## 2019-07-12 DIAGNOSIS — I639 Cerebral infarction, unspecified: Secondary | ICD-10-CM

## 2019-07-12 HISTORY — DX: Cerebral infarction, unspecified: I63.9

## 2019-07-17 ENCOUNTER — Ambulatory Visit: Payer: Medicare Other | Attending: Family

## 2019-07-17 DIAGNOSIS — Z23 Encounter for immunization: Secondary | ICD-10-CM

## 2019-07-17 NOTE — Progress Notes (Signed)
   Covid-19 Vaccination Clinic  Name:  Michelle Carrillo    MRN: [id number] DOB: 11/12/1950  07/17/2019  Michelle Carrillo was observed post Covid-19 immunization for 15 minutes without incident. She was provided with Vaccine Information Sheet and instruction to access the V-Safe system.   Michelle Carrillo was instructed to call 911 with any severe reactions post vaccine: Marland Kitchen Difficulty breathing  . Swelling of face and throat  . A fast heartbeat  . A bad rash all over body  . Dizziness and weakness   Immunizations Administered    Name Date Dose VIS Date Route   Moderna COVID-19 Vaccine 07/17/2019  2:24 PM 0.5 mL 03/13/2019 Intramuscular   Manufacturer: Moderna   Lot: [id number]   [id number]

## 2019-08-06 ENCOUNTER — Encounter: Payer: Self-pay | Admitting: *Deleted

## 2019-08-09 ENCOUNTER — Encounter (HOSPITAL_COMMUNITY)
Admission: EM | Disposition: A | Discharge status: Rehabilitation Facility | Payer: Self-pay | Source: Home / Self Care | Attending: Neurology

## 2019-08-09 ENCOUNTER — Inpatient Hospital Stay (HOSPITAL_COMMUNITY): Payer: Medicare Other | Admitting: Anesthesiology

## 2019-08-09 ENCOUNTER — Encounter (HOSPITAL_COMMUNITY): Payer: Self-pay

## 2019-08-09 ENCOUNTER — Inpatient Hospital Stay (HOSPITAL_COMMUNITY): Payer: Medicare Other

## 2019-08-09 ENCOUNTER — Other Ambulatory Visit: Payer: Self-pay

## 2019-08-09 ENCOUNTER — Inpatient Hospital Stay (HOSPITAL_COMMUNITY)
Admission: EM | Admit: 2019-08-09 | Discharge: 2019-08-23 | DRG: 023 | Disposition: A | Discharge status: Rehabilitation Facility | Payer: Medicare Other | Attending: Neurology | Admitting: Neurology

## 2019-08-09 ENCOUNTER — Emergency Department (HOSPITAL_COMMUNITY): Payer: Medicare Other

## 2019-08-09 DIAGNOSIS — E119 Type 2 diabetes mellitus without complications: Secondary | ICD-10-CM | POA: Diagnosis present

## 2019-08-09 DIAGNOSIS — G9349 Other encephalopathy: Secondary | ICD-10-CM | POA: Diagnosis not present

## 2019-08-09 DIAGNOSIS — R000 Tachycardia, unspecified: Secondary | ICD-10-CM | POA: Diagnosis not present

## 2019-08-09 DIAGNOSIS — S065X9A Traumatic subdural hemorrhage with loss of consciousness of unspecified duration, initial encounter: Secondary | ICD-10-CM | POA: Diagnosis not present

## 2019-08-09 DIAGNOSIS — Z833 Family history of diabetes mellitus: Secondary | ICD-10-CM | POA: Diagnosis not present

## 2019-08-09 DIAGNOSIS — I69122 Dysarthria following nontraumatic intracerebral hemorrhage: Secondary | ICD-10-CM | POA: Diagnosis not present

## 2019-08-09 DIAGNOSIS — R569 Unspecified convulsions: Secondary | ICD-10-CM | POA: Diagnosis not present

## 2019-08-09 DIAGNOSIS — R4701 Aphasia: Secondary | ICD-10-CM | POA: Diagnosis present

## 2019-08-09 DIAGNOSIS — I6200 Nontraumatic subdural hemorrhage, unspecified: Secondary | ICD-10-CM | POA: Diagnosis not present

## 2019-08-09 DIAGNOSIS — I959 Hypotension, unspecified: Secondary | ICD-10-CM | POA: Diagnosis not present

## 2019-08-09 DIAGNOSIS — F32A Depression, unspecified: Secondary | ICD-10-CM | POA: Diagnosis present

## 2019-08-09 DIAGNOSIS — F329 Major depressive disorder, single episode, unspecified: Secondary | ICD-10-CM | POA: Diagnosis not present

## 2019-08-09 DIAGNOSIS — Z20822 Contact with and (suspected) exposure to covid-19: Secondary | ICD-10-CM | POA: Diagnosis not present

## 2019-08-09 DIAGNOSIS — G936 Cerebral edema: Secondary | ICD-10-CM | POA: Diagnosis not present

## 2019-08-09 DIAGNOSIS — I611 Nontraumatic intracerebral hemorrhage in hemisphere, cortical: Secondary | ICD-10-CM

## 2019-08-09 DIAGNOSIS — I639 Cerebral infarction, unspecified: Secondary | ICD-10-CM | POA: Diagnosis present

## 2019-08-09 DIAGNOSIS — E669 Obesity, unspecified: Secondary | ICD-10-CM | POA: Diagnosis present

## 2019-08-09 DIAGNOSIS — S065XAA Traumatic subdural hemorrhage with loss of consciousness status unknown, initial encounter: Secondary | ICD-10-CM | POA: Diagnosis present

## 2019-08-09 DIAGNOSIS — Z9889 Other specified postprocedural states: Secondary | ICD-10-CM | POA: Diagnosis not present

## 2019-08-09 DIAGNOSIS — I629 Nontraumatic intracranial hemorrhage, unspecified: Secondary | ICD-10-CM

## 2019-08-09 DIAGNOSIS — I69120 Aphasia following nontraumatic intracerebral hemorrhage: Secondary | ICD-10-CM | POA: Diagnosis not present

## 2019-08-09 DIAGNOSIS — I10 Essential (primary) hypertension: Secondary | ICD-10-CM | POA: Diagnosis present

## 2019-08-09 DIAGNOSIS — Z03818 Encounter for observation for suspected exposure to other biological agents ruled out: Secondary | ICD-10-CM | POA: Diagnosis not present

## 2019-08-09 DIAGNOSIS — E785 Hyperlipidemia, unspecified: Secondary | ICD-10-CM | POA: Diagnosis present

## 2019-08-09 DIAGNOSIS — G9340 Encephalopathy, unspecified: Secondary | ICD-10-CM | POA: Diagnosis present

## 2019-08-09 DIAGNOSIS — I6201 Nontraumatic acute subdural hemorrhage: Secondary | ICD-10-CM | POA: Diagnosis not present

## 2019-08-09 DIAGNOSIS — D72829 Elevated white blood cell count, unspecified: Secondary | ICD-10-CM | POA: Diagnosis present

## 2019-08-09 DIAGNOSIS — F418 Other specified anxiety disorders: Secondary | ICD-10-CM | POA: Diagnosis not present

## 2019-08-09 DIAGNOSIS — I340 Nonrheumatic mitral (valve) insufficiency: Secondary | ICD-10-CM | POA: Diagnosis not present

## 2019-08-09 DIAGNOSIS — R29706 NIHSS score 6: Secondary | ICD-10-CM | POA: Diagnosis not present

## 2019-08-09 DIAGNOSIS — Z48811 Encounter for surgical aftercare following surgery on the nervous system: Secondary | ICD-10-CM | POA: Diagnosis not present

## 2019-08-09 DIAGNOSIS — R4182 Altered mental status, unspecified: Secondary | ICD-10-CM | POA: Diagnosis not present

## 2019-08-09 DIAGNOSIS — H547 Unspecified visual loss: Secondary | ICD-10-CM | POA: Diagnosis not present

## 2019-08-09 DIAGNOSIS — I618 Other nontraumatic intracerebral hemorrhage: Secondary | ICD-10-CM | POA: Diagnosis not present

## 2019-08-09 DIAGNOSIS — Z947 Corneal transplant status: Secondary | ICD-10-CM | POA: Diagnosis not present

## 2019-08-09 DIAGNOSIS — I69198 Other sequelae of nontraumatic intracerebral hemorrhage: Secondary | ICD-10-CM | POA: Diagnosis not present

## 2019-08-09 DIAGNOSIS — S065X0A Traumatic subdural hemorrhage without loss of consciousness, initial encounter: Secondary | ICD-10-CM | POA: Diagnosis not present

## 2019-08-09 DIAGNOSIS — I161 Hypertensive emergency: Secondary | ICD-10-CM | POA: Diagnosis not present

## 2019-08-09 DIAGNOSIS — Z6836 Body mass index (BMI) 36.0-36.9, adult: Secondary | ICD-10-CM

## 2019-08-09 DIAGNOSIS — F419 Anxiety disorder, unspecified: Secondary | ICD-10-CM | POA: Diagnosis not present

## 2019-08-09 DIAGNOSIS — E876 Hypokalemia: Secondary | ICD-10-CM | POA: Diagnosis present

## 2019-08-09 DIAGNOSIS — I619 Nontraumatic intracerebral hemorrhage, unspecified: Secondary | ICD-10-CM | POA: Diagnosis present

## 2019-08-09 DIAGNOSIS — I361 Nonrheumatic tricuspid (valve) insufficiency: Secondary | ICD-10-CM | POA: Diagnosis not present

## 2019-08-09 HISTORY — PX: CRANIOTOMY: SHX93

## 2019-08-09 LAB — RESPIRATORY PANEL BY RT PCR (FLU A&B, COVID)
Influenza A by PCR: NEGATIVE
Influenza B by PCR: NEGATIVE
SARS Coronavirus 2 by RT PCR: NEGATIVE

## 2019-08-09 LAB — HEMOGLOBIN A1C
Hgb A1c MFr Bld: 5.7 % — ABNORMAL HIGH (ref 4.8–5.6)
Mean Plasma Glucose: 116.89 mg/dL

## 2019-08-09 LAB — CBC
HCT: 41.1 % (ref 36.0–46.0)
Hemoglobin: 13.6 g/dL (ref 12.0–15.0)
MCH: 31.3 pg (ref 26.0–34.0)
MCHC: 33.1 g/dL (ref 30.0–36.0)
MCV: 94.7 fL (ref 80.0–100.0)
Platelets: 283 10*3/uL (ref 150–400)
RBC: 4.34 MIL/uL (ref 3.87–5.11)
RDW: 12.8 % (ref 11.5–15.5)
WBC: 10.6 10*3/uL — ABNORMAL HIGH (ref 4.0–10.5)
nRBC: 0 % (ref 0.0–0.2)

## 2019-08-09 LAB — GLUCOSE, CAPILLARY
Glucose-Capillary: 155 mg/dL — ABNORMAL HIGH (ref 70–99)
Glucose-Capillary: 121 mg/dL — ABNORMAL HIGH (ref 70–99)
Glucose-Capillary: 116 mg/dL — ABNORMAL HIGH (ref 70–99)
Glucose-Capillary: 118 mg/dL — ABNORMAL HIGH (ref 70–99)
Glucose-Capillary: 120 mg/dL — ABNORMAL HIGH (ref 70–99)

## 2019-08-09 LAB — COMPREHENSIVE METABOLIC PANEL
ALT: 24 U/L (ref 0–44)
AST: 28 U/L (ref 15–41)
Albumin: 3.5 g/dL (ref 3.5–5.0)
Alkaline Phosphatase: 130 U/L — ABNORMAL HIGH (ref 38–126)
Anion gap: 11 (ref 5–15)
BUN: 25 mg/dL — ABNORMAL HIGH (ref 8–23)
CO2: 24 mmol/L (ref 22–32)
Calcium: 8.7 mg/dL — ABNORMAL LOW (ref 8.9–10.3)
Chloride: 104 mmol/L (ref 98–111)
Creatinine, Ser: 0.75 mg/dL (ref 0.44–1.00)
GFR calc Af Amer: >60 mL/min (ref >60)
GFR calc non Af Amer: >60 mL/min (ref >60)
Glucose, Bld: 194 mg/dL — ABNORMAL HIGH (ref 70–99)
Potassium: 3.8 mmol/L (ref 3.5–5.1)
Sodium: 139 mmol/L (ref 135–145)
Total Bilirubin: 0.6 mg/dL (ref 0.3–1.2)
Total Protein: 7.4 g/dL (ref 6.5–8.1)

## 2019-08-09 LAB — DIFFERENTIAL
Abs Immature Granulocytes: 0.02 10*3/uL (ref 0.00–0.07)
Basophils Absolute: 0 10*3/uL (ref 0.0–0.1)
Basophils Relative: 0 %
Eosinophils Absolute: 0.1 10*3/uL (ref 0.0–0.5)
Eosinophils Relative: 1 %
Immature Granulocytes: 0 %
Lymphocytes Relative: 16 %
Lymphs Abs: 1.7 10*3/uL (ref 0.7–4.0)
Monocytes Absolute: 0.5 10*3/uL (ref 0.1–1.0)
Monocytes Relative: 5 %
Neutro Abs: 8.3 10*3/uL — ABNORMAL HIGH (ref 1.7–7.7)
Neutrophils Relative %: 78 %

## 2019-08-09 LAB — RAPID URINE DRUG SCREEN, HOSP PERFORMED
Amphetamines: NOT DETECTED
Barbiturates: NOT DETECTED
Benzodiazepines: NOT DETECTED
Cocaine: NOT DETECTED
Opiates: NOT DETECTED
Tetrahydrocannabinol: NOT DETECTED

## 2019-08-09 LAB — ETHANOL: Alcohol, Ethyl (B): <10 mg/dL (ref <10)

## 2019-08-09 LAB — PROTIME-INR
INR: 1 (ref 0.8–1.2)
Prothrombin Time: 12.5 seconds (ref 11.4–15.2)

## 2019-08-09 LAB — I-STAT CHEM 8, ED
BUN: 28 mg/dL — ABNORMAL HIGH (ref 8–23)
Calcium, Ion: 1.07 mmol/L — ABNORMAL LOW (ref 1.15–1.40)
Chloride: 101 mmol/L (ref 98–111)
Creatinine, Ser: 0.7 mg/dL (ref 0.44–1.00)
Glucose, Bld: 188 mg/dL — ABNORMAL HIGH (ref 70–99)
HCT: 40 % (ref 36.0–46.0)
Hemoglobin: 13.6 g/dL (ref 12.0–15.0)
Potassium: 3.8 mmol/L (ref 3.5–5.1)
Sodium: 139 mmol/L (ref 135–145)
TCO2: 27 mmol/L (ref 22–32)

## 2019-08-09 LAB — CBG MONITORING, ED
Glucose-Capillary: 182 mg/dL — ABNORMAL HIGH (ref 70–99)
Glucose-Capillary: 206 mg/dL — ABNORMAL HIGH (ref 70–99)

## 2019-08-09 LAB — HIV ANTIBODY (ROUTINE TESTING W REFLEX): HIV Screen 4th Generation wRfx: NONREACTIVE

## 2019-08-09 LAB — APTT: aPTT: 27 seconds (ref 24–36)

## 2019-08-09 LAB — MRSA PCR SCREENING: MRSA by PCR: NEGATIVE

## 2019-08-09 SURGERY — CRANIOTOMY HEMATOMA EVACUATION SUBDURAL
Anesthesia: General | Site: Head | Laterality: Left

## 2019-08-09 MED ORDER — LACTATED RINGERS IV SOLN: INTRAVENOUS | Status: DC | PRN

## 2019-08-09 MED ORDER — LIDOCAINE 2% (20 MG/ML) 5 ML SYRINGE
INTRAMUSCULAR | Status: AC
  Filled 2019-08-09: qty 5

## 2019-08-09 MED ORDER — ROCURONIUM BROMIDE 10 MG/ML (PF) SYRINGE
PREFILLED_SYRINGE | INTRAVENOUS | Status: AC
  Filled 2019-08-09: qty 10

## 2019-08-09 MED ORDER — LIDOCAINE-EPINEPHRINE 0.5 %-1:200000 IJ SOLN
INTRAMUSCULAR | Status: DC | PRN
  Administered 2019-08-09: 10 mL

## 2019-08-09 MED ORDER — FENTANYL CITRATE (PF) 250 MCG/5ML IJ SOLN
INTRAMUSCULAR | Status: AC
  Filled 2019-08-09: qty 5

## 2019-08-09 MED ORDER — LEVETIRACETAM IN NACL 1000 MG/100ML IV SOLN
1000.0000 mg | INTRAVENOUS | Status: AC
  Administered 2019-08-09: 1000 mg via INTRAVENOUS
  Filled 2019-08-09: qty 100

## 2019-08-09 MED ORDER — SUGAMMADEX SODIUM 200 MG/2ML IV SOLN
INTRAVENOUS | Status: DC | PRN
Start: 2019-08-09 — End: 2019-08-09
  Administered 2019-08-09: 160 mg via INTRAVENOUS

## 2019-08-09 MED ORDER — CHLORHEXIDINE GLUCONATE CLOTH 2 % EX PADS
6.0000 | MEDICATED_PAD | Freq: Every day | CUTANEOUS | Status: DC
  Administered 2019-08-09 – 2019-08-21 (×8): 6 via TOPICAL

## 2019-08-09 MED ORDER — FENTANYL CITRATE (PF) 250 MCG/5ML IJ SOLN
INTRAMUSCULAR | Status: DC | PRN
  Administered 2019-08-09: 100 ug via INTRAVENOUS

## 2019-08-09 MED ORDER — INSULIN ASPART 100 UNIT/ML ~~LOC~~ SOLN
0.0000 [IU] | SUBCUTANEOUS | Status: DC
  Administered 2019-08-10 (×2): 2 [IU] via SUBCUTANEOUS
  Administered 2019-08-11: 5 [IU] via SUBCUTANEOUS
  Administered 2019-08-12: 3 [IU] via SUBCUTANEOUS
  Administered 2019-08-13: 2 [IU] via SUBCUTANEOUS

## 2019-08-09 MED ORDER — BACITRACIN ZINC 500 UNIT/GM EX OINT
TOPICAL_OINTMENT | CUTANEOUS | Status: DC | PRN
  Administered 2019-08-09: 1 via TOPICAL

## 2019-08-09 MED ORDER — EPHEDRINE 5 MG/ML INJ
INTRAVENOUS | Status: AC
  Filled 2019-08-09: qty 20

## 2019-08-09 MED ORDER — NALOXONE HCL 0.4 MG/ML IJ SOLN: 0.0800 mg | INTRAMUSCULAR | Status: DC | PRN

## 2019-08-09 MED ORDER — ONDANSETRON HCL 4 MG/2ML IJ SOLN
INTRAMUSCULAR | Status: DC | PRN
  Administered 2019-08-09: 4 mg via INTRAVENOUS

## 2019-08-09 MED ORDER — BACITRACIN ZINC 500 UNIT/GM EX OINT
TOPICAL_OINTMENT | CUTANEOUS | Status: AC
  Filled 2019-08-09: qty 28.35

## 2019-08-09 MED ORDER — PHENYLEPHRINE 40 MCG/ML (10ML) SYRINGE FOR IV PUSH (FOR BLOOD PRESSURE SUPPORT)
PREFILLED_SYRINGE | INTRAVENOUS | Status: AC
  Filled 2019-08-09: qty 10

## 2019-08-09 MED ORDER — LEVETIRACETAM IN NACL 500 MG/100ML IV SOLN
500.0000 mg | Freq: Two times a day (BID) | INTRAVENOUS | Status: DC
  Administered 2019-08-09 – 2019-08-10 (×2): 500 mg via INTRAVENOUS
  Filled 2019-08-09 (×2): qty 100

## 2019-08-09 MED ORDER — STROKE: EARLY STAGES OF RECOVERY BOOK: Freq: Once | Status: DC

## 2019-08-09 MED ORDER — SENNOSIDES-DOCUSATE SODIUM 8.6-50 MG PO TABS
1.0000 | ORAL_TABLET | Freq: Two times a day (BID) | ORAL | Status: DC
  Administered 2019-08-09 – 2019-08-23 (×24): 1 via ORAL
  Filled 2019-08-09 (×26): qty 1

## 2019-08-09 MED ORDER — ACETAMINOPHEN 325 MG PO TABS
650.0000 mg | ORAL_TABLET | ORAL | Status: DC | PRN
  Administered 2019-08-12: 650 mg via ORAL
  Filled 2019-08-09: qty 2

## 2019-08-09 MED ORDER — CEFAZOLIN SODIUM 1 G IJ SOLR
INTRAMUSCULAR | Status: AC
  Filled 2019-08-09: qty 20

## 2019-08-09 MED ORDER — 0.9 % SODIUM CHLORIDE (POUR BTL) OPTIME
TOPICAL | Status: DC | PRN
  Administered 2019-08-09 (×3): 1000 mL

## 2019-08-09 MED ORDER — THROMBIN 5000 UNITS EX SOLR
OROMUCOSAL | Status: DC | PRN
  Administered 2019-08-09: 5 mL via TOPICAL

## 2019-08-09 MED ORDER — MORPHINE SULFATE (PF) 2 MG/ML IV SOLN: 1.0000 mg | INTRAVENOUS | Status: DC | PRN

## 2019-08-09 MED ORDER — SODIUM CHLORIDE 0.9 % IV SOLN: INTRAVENOUS | Status: DC | PRN

## 2019-08-09 MED ORDER — SUCCINYLCHOLINE 20MG/ML (10ML) SYRINGE FOR MEDFUSION PUMP - OPTIME
INTRAMUSCULAR | Status: DC | PRN
Start: 2019-08-09 — End: 2019-08-09
  Administered 2019-08-09: 120 mg via INTRAVENOUS

## 2019-08-09 MED ORDER — CEFAZOLIN SODIUM-DEXTROSE 2-3 GM-%(50ML) IV SOLR
INTRAVENOUS | Status: DC | PRN
  Administered 2019-08-09: 2 g via INTRAVENOUS

## 2019-08-09 MED ORDER — HYDROCODONE-ACETAMINOPHEN 5-325 MG PO TABS
1.0000 | ORAL_TABLET | ORAL | Status: DC | PRN
  Administered 2019-08-11 – 2019-08-22 (×3): 1 via ORAL
  Filled 2019-08-09 (×3): qty 1

## 2019-08-09 MED ORDER — ONDANSETRON HCL 4 MG/2ML IJ SOLN: 4.0000 mg | INTRAMUSCULAR | Status: DC | PRN

## 2019-08-09 MED ORDER — SODIUM CHLORIDE 0.9 % IV SOLN
INTRAVENOUS | Status: DC | PRN
  Administered 2019-08-09: 200 mL via INTRAVENOUS
  Administered 2019-08-17: 13:00:00 250 mL via INTRAVENOUS

## 2019-08-09 MED ORDER — SUCCINYLCHOLINE CHLORIDE 200 MG/10ML IV SOSY
PREFILLED_SYRINGE | INTRAVENOUS | Status: AC
  Filled 2019-08-09: qty 10

## 2019-08-09 MED ORDER — THROMBIN 5000 UNITS EX SOLR
CUTANEOUS | Status: AC
  Filled 2019-08-09: qty 5000

## 2019-08-09 MED ORDER — PANTOPRAZOLE SODIUM 40 MG IV SOLR
40.0000 mg | Freq: Every day | INTRAVENOUS | Status: DC
  Administered 2019-08-09: 40 mg via INTRAVENOUS
  Filled 2019-08-09: qty 40

## 2019-08-09 MED ORDER — PROPOFOL 500 MG/50ML IV EMUL
INTRAVENOUS | Status: DC | PRN
  Administered 2019-08-09: 150 ug/kg/min via INTRAVENOUS
  Administered 2019-08-09: 75 ug/kg/min via INTRAVENOUS

## 2019-08-09 MED ORDER — THROMBIN 20000 UNITS EX SOLR
CUTANEOUS | Status: AC
  Filled 2019-08-09: qty 20000

## 2019-08-09 MED ORDER — PROPOFOL 10 MG/ML IV BOLUS
INTRAVENOUS | Status: AC
  Filled 2019-08-09: qty 20

## 2019-08-09 MED ORDER — PROPOFOL 10 MG/ML IV BOLUS
INTRAVENOUS | Status: DC | PRN
  Administered 2019-08-09: 50 mg via INTRAVENOUS
  Administered 2019-08-09: 150 mg via INTRAVENOUS

## 2019-08-09 MED ORDER — ACETAMINOPHEN 650 MG RE SUPP: 650.0000 mg | RECTAL | Status: DC | PRN

## 2019-08-09 MED ORDER — ACETAMINOPHEN 160 MG/5ML PO SOLN: 650.0000 mg | ORAL | Status: DC | PRN

## 2019-08-09 MED ORDER — PROMETHAZINE HCL 12.5 MG PO TABS
12.5000 mg | ORAL_TABLET | ORAL | Status: DC | PRN
  Filled 2019-08-09: qty 2

## 2019-08-09 MED ORDER — CLEVIDIPINE BUTYRATE 0.5 MG/ML IV EMUL
0.0000 mg/h | INTRAVENOUS | Status: DC
  Administered 2019-08-09: 8 mg/h via INTRAVENOUS
  Administered 2019-08-09: 6 mg/h via INTRAVENOUS
  Administered 2019-08-09: 1 mg/h via INTRAVENOUS
  Filled 2019-08-09 (×3): qty 50

## 2019-08-09 MED ORDER — ONDANSETRON HCL 4 MG PO TABS: 4.0000 mg | ORAL_TABLET | ORAL | Status: DC | PRN

## 2019-08-09 MED ORDER — LIDOCAINE HCL (CARDIAC) PF 100 MG/5ML IV SOSY
PREFILLED_SYRINGE | INTRAVENOUS | Status: DC | PRN
  Administered 2019-08-09: 100 mg via INTRATRACHEAL

## 2019-08-09 MED ORDER — CLEVIDIPINE BUTYRATE 0.5 MG/ML IV EMUL
INTRAVENOUS | Status: AC
  Filled 2019-08-09: qty 50

## 2019-08-09 MED ORDER — THROMBIN 20000 UNITS EX SOLR: CUTANEOUS | Status: DC | PRN

## 2019-08-09 MED ORDER — PHENYLEPHRINE HCL (PRESSORS) 10 MG/ML IV SOLN
INTRAVENOUS | Status: DC | PRN
  Administered 2019-08-09 (×5): 80 ug via INTRAVENOUS

## 2019-08-09 MED ORDER — POTASSIUM CHLORIDE IN NACL 20-0.9 MEQ/L-% IV SOLN
INTRAVENOUS | Status: DC
  Filled 2019-08-09 (×3): qty 1000

## 2019-08-09 MED ORDER — LABETALOL HCL 5 MG/ML IV SOLN
10.0000 mg | INTRAVENOUS | Status: DC | PRN
  Administered 2019-08-10: 20 mg via INTRAVENOUS
  Administered 2019-08-10 (×3): 40 mg via INTRAVENOUS
  Administered 2019-08-11: 10 mg via INTRAVENOUS
  Administered 2019-08-11: 20 mg via INTRAVENOUS
  Administered 2019-08-12: 10 mg via INTRAVENOUS
  Administered 2019-08-12: 20 mg via INTRAVENOUS
  Administered 2019-08-12: 10 mg via INTRAVENOUS
  Administered 2019-08-13 – 2019-08-14 (×4): 20 mg via INTRAVENOUS
  Filled 2019-08-09 (×4): qty 4
  Filled 2019-08-09: qty 8
  Filled 2019-08-09 (×5): qty 4
  Filled 2019-08-09 (×3): qty 8
  Filled 2019-08-09: qty 4

## 2019-08-09 MED ORDER — ROCURONIUM 10MG/ML (10ML) SYRINGE FOR MEDFUSION PUMP - OPTIME
INTRAVENOUS | Status: DC | PRN
  Administered 2019-08-09: 15 mg via INTRAVENOUS
  Administered 2019-08-09: 65 mg via INTRAVENOUS

## 2019-08-09 MED ORDER — PROPOFOL 1000 MG/100ML IV EMUL
INTRAVENOUS | Status: AC
  Filled 2019-08-09: qty 100

## 2019-08-09 MED ORDER — LIDOCAINE-EPINEPHRINE 0.5 %-1:200000 IJ SOLN
INTRAMUSCULAR | Status: AC
  Filled 2019-08-09: qty 1

## 2019-08-09 MED ORDER — PHENYLEPHRINE HCL-NACL 10-0.9 MG/250ML-% IV SOLN
INTRAVENOUS | Status: DC | PRN
  Administered 2019-08-09: 50 ug/min via INTRAVENOUS

## 2019-08-09 SURGICAL SUPPLY — 65 items
APL SKNCLS STERI-STRIP NONHPOA (GAUZE/BANDAGES/DRESSINGS)
BENZOIN TINCTURE PRP APPL 2/3 (GAUZE/BANDAGES/DRESSINGS) IMPLANT
BLADE CLIPPER SURG (BLADE) ×3 IMPLANT
BLADE ULTRA TIP 2M (BLADE) ×1 IMPLANT
BNDG CMPR 75X41 PLY ABS (GAUZE/BANDAGES/DRESSINGS) ×1
BNDG GAUZE ELAST 4 BULKY (GAUZE/BANDAGES/DRESSINGS) ×2 IMPLANT
BNDG STRETCH 4X75 NS LF (GAUZE/BANDAGES/DRESSINGS) ×1 IMPLANT
BUR ACORN 6.0 PRECISION (BURR) ×2 IMPLANT
BUR MATCHSTICK NEURO 3.0 LAGG (BURR) IMPLANT
BUR SPIRAL ROUTER 2.3 (BUR) ×1 IMPLANT
CANISTER SUCT 3000ML PPV (MISCELLANEOUS) ×3 IMPLANT
CARTRIDGE OIL MAESTRO DRILL (MISCELLANEOUS) ×1 IMPLANT
COVER BURR HOLE UNIV 10 (Orthopedic Implant) ×2 IMPLANT
DIFFUSER DRILL AIR PNEUMATIC (MISCELLANEOUS) ×2 IMPLANT
DRAPE NEUROLOGICAL W/INCISE (DRAPES) ×2 IMPLANT
DRAPE WARM FLUID 44X44 (DRAPES) ×2 IMPLANT
DURAPREP 6ML APPLICATOR 50/CS (WOUND CARE) ×2 IMPLANT
ELECT REM PT RETURN 9FT ADLT (ELECTROSURGICAL) ×2
ELECTRODE REM PT RTRN 9FT ADLT (ELECTROSURGICAL) ×1 IMPLANT
EVACUATOR 1/8 PVC DRAIN (DRAIN) IMPLANT
EVACUATOR SILICONE 100CC (DRAIN) IMPLANT
GAUZE 4X4 16PLY RFD (DISPOSABLE) IMPLANT
GAUZE SPONGE 4X4 12PLY STRL (GAUZE/BANDAGES/DRESSINGS) ×2 IMPLANT
GLOVE BIOGEL PI IND STRL 7.0 (GLOVE) IMPLANT
GLOVE BIOGEL PI IND STRL 7.5 (GLOVE) IMPLANT
GLOVE BIOGEL PI INDICATOR 7.0 (GLOVE) ×3
GLOVE BIOGEL PI INDICATOR 7.5 (GLOVE) ×3
GLOVE ECLIPSE 6.5 STRL STRAW (GLOVE) ×2 IMPLANT
GLOVE EXAM NITRILE XL STR (GLOVE) IMPLANT
GLOVE SURG SS PI 6.5 STRL IVOR (GLOVE) ×2 IMPLANT
GLOVE SURG SS PI 7.0 STRL IVOR (GLOVE) ×3 IMPLANT
GOWN STRL REUS W/ TWL LRG LVL3 (GOWN DISPOSABLE) ×2 IMPLANT
GOWN STRL REUS W/ TWL XL LVL3 (GOWN DISPOSABLE) IMPLANT
GOWN STRL REUS W/TWL LRG LVL3 (GOWN DISPOSABLE) ×4
GOWN STRL REUS W/TWL XL LVL3 (GOWN DISPOSABLE) ×4
GRAFT DURAGEN MATRIX 5WX7L (Graft) ×1 IMPLANT
HEMOSTAT POWDER SURGIFOAM 1G (HEMOSTASIS) ×1 IMPLANT
HOOK DURA 1/2IN (MISCELLANEOUS) ×1 IMPLANT
KIT BASIN OR (CUSTOM PROCEDURE TRAY) ×2 IMPLANT
KIT TURNOVER KIT B (KITS) ×2 IMPLANT
NDL HYPO 25X1 1.5 SAFETY (NEEDLE) ×1 IMPLANT
NEEDLE HYPO 25X1 1.5 SAFETY (NEEDLE) ×2 IMPLANT
NS IRRIG 1000ML POUR BTL (IV SOLUTION) ×6 IMPLANT
OIL CARTRIDGE MAESTRO DRILL (MISCELLANEOUS) ×2
PACK BATTERY CMF DISP FOR DVR (ORTHOPEDIC DISPOSABLE SUPPLIES) ×1 IMPLANT
PACK CRANIOTOMY CUSTOM (CUSTOM PROCEDURE TRAY) ×2 IMPLANT
PATTIES SURGICAL .5 X.5 (GAUZE/BANDAGES/DRESSINGS) IMPLANT
PATTIES SURGICAL .5 X3 (DISPOSABLE) IMPLANT
PATTIES SURGICAL 1X1 (DISPOSABLE) IMPLANT
PLATE BONE 12 2H TARGET XL (Plate) ×2 IMPLANT
PLATE CMF SML 4H (Plate) ×1 IMPLANT
SCREW UNIII AXS SD 1.5X4 (Screw) ×17 IMPLANT
SPONGE SURGIFOAM ABS GEL 100 (HEMOSTASIS) ×2 IMPLANT
STAPLER VISISTAT 35W (STAPLE) ×3 IMPLANT
SUT ETHILON 3 0 FSL (SUTURE) ×1 IMPLANT
SUT NURALON 4 0 TR CR/8 (SUTURE) ×4 IMPLANT
SUT STEEL 0 (SUTURE)
SUT STEEL 0 18XMFL TIE 17 (SUTURE) IMPLANT
SUT VIC AB 2-0 CT2 18 VCP726D (SUTURE) ×6 IMPLANT
TOWEL GREEN STERILE (TOWEL DISPOSABLE) ×2 IMPLANT
TOWEL GREEN STERILE FF (TOWEL DISPOSABLE) ×2 IMPLANT
TRAY FOLEY MTR SLVR 16FR STAT (SET/KITS/TRAYS/PACK) ×2 IMPLANT
TUBE CONNECTING 12X1/4 (SUCTIONS) ×2 IMPLANT
UNDERPAD 30X30 (UNDERPADS AND DIAPERS) ×2 IMPLANT
WATER STERILE IRR 1000ML POUR (IV SOLUTION) ×2 IMPLANT

## 2019-08-09 NOTE — Anesthesia Procedure Notes (Signed)
Procedure Name: Intubation Date/Time: 08/09/2019 5:32 AM Performed by: Valetta Fuller, CRNA Pre-anesthesia Checklist: Patient identified, Emergency Drugs available, Suction available and Patient being monitored Patient Re-evaluated:Patient Re-evaluated prior to induction Oxygen Delivery Method: Circle system utilized Preoxygenation: Pre-oxygenation with 100% oxygen Induction Type: IV induction, Rapid sequence and Cricoid Pressure applied Laryngoscope Size: Miller and 2 Grade View: Grade I Tube type: Oral Tube size: 7.5 mm Number of attempts: 1 Airway Equipment and Method: Stylet Placement Confirmation: ETT inserted through vocal cords under direct vision,  positive ETCO2 and breath sounds checked- equal and bilateral Secured at: 21 cm Tube secured with: Tape Dental Injury: Teeth and Oropharynx as per pre-operative assessment

## 2019-08-09 NOTE — Progress Notes (Signed)
STROKE TEAM PROGRESS NOTE   INTERVAL HISTORY Her daughter is at the bedside. Patient returned from OR and had surgery first left convexity subdural hematoma evacuation. She has become more arousable after surgery is able to speak a few words but remains aphasic. Vital signs are stable. Blood pressure well controlled. I have reviewed history of presenting illness and details with the patient and daughter and electronic medical records and imaging films in PACS. Patient has not had any recent head injury fall out head trauma.  Vitals:   08/09/19 0824 08/09/19 0842 08/09/19 0853 08/09/19 0900  BP: (!) 112/59 (!) 123/38 112/75 (!) 125/57  Pulse: 88 81  82  Resp: (!) 22 15  (!) 23  Temp:  (!) 97.3 F (36.3 C)    TempSrc:      SpO2:  99%  100%  Weight:      Height:        CBC:  Recent Labs  Lab 08/09/19 0247 08/09/19 0257  WBC 10.6*  --   NEUTROABS 8.3*  --   HGB 13.6 13.6  HCT 41.1 40.0  MCV 94.7  --   PLT 283  --     Basic Metabolic Panel:  Recent Labs  Lab 08/09/19 0247 08/09/19 0257  NA 139 139  K 3.8 3.8  CL 104 101  CO2 24  --   GLUCOSE 194* 188*  BUN 25* 28*  CREATININE 0.75 0.70  CALCIUM 8.7*  --    Lipid Panel: No results found for: CHOL, TRIG, HDL, CHOLHDL, VLDL, LDLCALC HgbA1c: No results found for: HGBA1C Urine Drug Screen: No results found for: LABOPIA, COCAINSCRNUR, LABBENZ, AMPHETMU, THCU, LABBARB  Alcohol Level     Component Value Date/Time   ETH <10 08/09/2019 0247    IMAGING past 24 hours CT HEAD CODE STROKE WO CONTRAST  Result Date: 08/09/2019 CLINICAL DATA:  Code stroke. Initial evaluation for acute altered mental status. EXAM: CT HEAD WITHOUT CONTRAST TECHNIQUE: Contiguous axial images were obtained from the base of the skull through the vertex without intravenous contrast. COMPARISON:  None. FINDINGS: Brain: Acute intraparenchymal hemorrhage centered at the left parietal lobe measures 3.9 x 3.7 x 3.2 cm (estimated volume 23 cc). Mild  surrounding vasogenic edema. Trace adjacent subarachnoid blood. Associated subdural extension with a left subdural hematoma seen overlying the left cerebral convexity. Subdural collection measures up to 9 mm in maximal thickness. Associated mass effect with left-to-right midline shift measuring up to 11 mm. Left lateral ventricle partially effaced with asymmetric dilatation of the right lateral ventricle, concerning for early trapping. No visible transependymal flow of CSF. Mild basilar cistern crowding without transtentorial herniation. No other acute intracranial hemorrhage. No other acute large vessel territory infarct. No appreciable mass lesion. Vascular: No hyperdense vessel. Scattered vascular calcifications noted within the carotid siphons. Skull: Scalp soft tissues and calvarium within normal limits. Sinuses/Orbits: Globes and orbital soft tissues demonstrate no acute finding. Left gaze noted. Scattered mucosal thickening noted throughout the paranasal sinuses. Left maxillary sinus retention cyst no mastoid effusion. Other: None. IMPRESSION: 1. 3.9 x 3.7 x 3.2 cm (estimated volume 23 cc) acute intraparenchymal hemorrhage centered at the left parietal convexity with mild surrounding vasogenic edema. 2. Associated subdural extension with 9 mm subdural hematoma overlying the left cerebral convexity. Associated mass effect with up to 11 mm left-to-right midline shift. 3. Asymmetric dilatation of the right lateral ventricle, concerning for early ventricular trapping. No visible transependymal flow of CSF. 4. ASPECTS: Does not apply given ICH. These results  were communicated to Dr. Leonel Ramsay at 3:11 amon 4/29/2021by text page via the [facility] messaging system. Electronically Signed   By: Jeannine Boga M.D.   On: 08/09/2019 03:13    PHYSICAL EXAM Pleasant middle-aged Hispanic lady not in distress. She has surgical dressing on her head from recent subdural hematoma surgery. . Afebrile. Head is  nontraumatic. Neck is supple without bruit.    Cardiac exam no murmur or gallop. Lungs are clear to auscultation. Distal pulses are well felt. Neurological Exam :  Patient is drowsy but can be easily aroused. She is slight left gaze preference but can look to the right past midline. She speaks only few words but cannot speak sentences. She follows simple midline and some one-step commands. Extraocular movements appear full range. Blinks to threat bilaterally. Fundi not visualized. Mild right lower facial asymmetry when she smiles. Tongue midline. Able to move all 4 extremities well against gravity but there appears to be mild right hand and leg weakness. No drift. Sensation preserved bilaterally. Gait not tested ASSESSMENT/PLAN Ms. CAHTERINE CONNEELY is a 69 y.o. female with history of HTN, anxiety, depression. Presenting to ER with AMS, n/v. She was found to have a rather large ICH/SDH which required urgent craniotomy on 4/29.   Non-traumatic ICH and subsequent SDH- Possible etiologies include ischemic with hemorrhagic conversion, tumor, hypertensive, avm,    CT: moderate cortically based hematoma with extension into the subdural space resulting in a fairly sizable subdural hematoma on the left.  MRI  Ordered post op  MRA  Ordered post op  LDL No results found for requested labs within last 26280 hours.  HgbA1c No results found for requested labs within last 26280 hours.  SCDs for VTE prophylaxis    Diet   Diet NPO time specified     None prior to admission, now on none d/t bleeding  Therapy recommendations:  pending  Disposition:  pending  Hypertension  Home meds:  zestril  Stable . Long-term BP goal normotensive  Hyperlipidemia  Home meds: none LDL No results found for requested labs within last 26280 hours.   No hx of Diabetes   HgbA1c No results found for requested labs within last 26280 hours., goal < 7.0  CBGs Recent Labs    08/09/19 0235 08/09/19 0505  08/09/19 0837  GLUCAP 182* 206* 155*      Other Stroke Risk Factors  Advanced age  Other Active Problems S/p emergent [facility] day # 0 Desiree Metzger-Cihelka, ARNP-C, ANVP-BC Pager: [phone] I have personally obtained history,examined this patient, reviewed notes, independently viewed imaging studies, participated in medical decision making and plan of care.ROS completed by me personally and pertinent positives fully documented  I have made any additions or clarifications directly to the above note. Agree with note above. Recommend close neurological monitoring and strict blood pressure control with systolic goal 0000000 for 24 hours and then below 160. Mobilize out of bed. Therapy consults. Check MRI scan of the brain with MRI of the brain. Long discussion with the patient and daughter at the bedside and answered questions about her care. Appreciate neurosurgery help. This patient is critically ill and at significant risk of neurological worsening, death and care requires constant monitoring of vital signs, hemodynamics,respiratory and cardiac monitoring, extensive review of multiple databases, frequent neurological assessment, discussion with family, other specialists and medical decision making of high complexity.I have made any additions or clarifications directly to the above note.This critical care time does not reflect procedure time, or teaching  time or supervisory time of PA/NP/Med Resident etc but could involve care discussion time.  I spent 30 minutes of neurocritical care time  in the care of  this patient.      Antony Contras, MD Medical Director [facility] Pager: [phone] 08/09/2019 5:11 PM  To contact Stroke Continuity provider, please refer to [email or web address] After hours, contact General Neurology

## 2019-08-09 NOTE — H&P (Signed)
Neurology H&P  CC: Aphasia  History is obtained from: Daughters  HPI: Michelle Carrillo is a 69 y.o. female the history of hypertension who presents with altered mental status that started abruptly this evening.  She was last seen around 1 AM at which time she was speaking normally.  She went to her room at that time.  Subsequently, as her daughter was going to bed, she noticed that there was vomit on the patient.  She went into check on her and found her to be quite confused.  Of note, the patient is Spanish only speaking.  LKW: 1 AM tpa given?: No, ICH IR Thrombectomy? No, ICH Modified Rankin Scale: 0-Completely asymptomatic and back to baseline post- stroke NIHSS: 6   ROS:  Unable to obtain due to altered mental status.   Past Medical History:  Diagnosis Date  . Anxiety   . Depression   . Herpes zoster kerstitis s/p corneal transplant 2015   . Hypertension   . Right knee osteoarthritis   . Strabismus      History reviewed. No pertinent family history.   Social History:  reports that she has never smoked. She has never used smokeless tobacco. She reports that she does not drink alcohol or use drugs.   Prior to Admission medications   Medication Sig Start Date End Date Taking? Authorizing Provider  Glucosamine Sulfate 1000 MG CAPS Take 1 capsule (1,000 mg total) by mouth 2 (two) times daily. 04/03/19   Hilts, Legrand Como, MD  lisinopril (ZESTRIL) 20 MG tablet Take 1 tablet by mouth once daily 06/13/19   Welford Roche, MD  meloxicam (MOBIC) 15 MG tablet Take 0.5-1 tablets (7.5-15 mg total) by mouth daily as needed for pain. 04/03/19   Hilts, Legrand Como, MD  PARoxetine (PAXIL) 20 MG tablet Take 1 tablet by mouth daily 06/26/19   Sid Falcon, MD  prednisoLONE acetate (PRED FORTE) 1 % ophthalmic suspension Apply to eye. 09/06/17   [provider]     Exam: Current vital signs: BP (!) 114/48   Pulse 90   Temp (!) 97.5 F (36.4 C) (Oral)   Resp 20   Ht  5' (1.524 m)   Wt 83.5 kg   SpO2 98%   BMI 35.95 kg/m    Physical Exam  Constitutional: Appears well-developed and well-nourished.  Psych: Affect appropriate to situation Eyes: No scleral injection HENT: No OP obstrucion Head: Normocephalic.  Cardiovascular: Normal rate and regular rhythm.  Respiratory: Effort normal and breath sounds normal to anterior ascultation GI: Soft.  No distension. There is no tenderness.  Skin: WDI  Neuro: Mental Status: Patient is awake, alert, she is able to give me her name, when asked for the date, she repeatedly gives her birthdate.  She has perseveration, when asked other questions, she continues to repeat her birthdate. Cranial Nerves: II: She appears to blink to threat bilaterally, but this is slightly inconsistent and she rapidly fatigues and does not blink to threat from either direction because she knows it is coming.  Pupils are equal, round, and reactive to light.   III,IV, VI: EOMI without ptosis or diplopia.  V: VII: Face appears symmetric Motor: She has some mild right arm weakness, with mild drift, gives good strength in bilateral legs Sensory: She responds to noxious stimulation bilaterally, but subjective sensory examination is difficult due to perseveration on the word "si" Deep Tendon Reflexes: 2+ and symmetric in the biceps and patellae.  Cerebellar: She does not formally cooperate, but no clear  ataxia   I have reviewed labs in epic and the pertinent results are: Glucose 194  I have reviewed the images obtained: CT head-moderate cortically based hematoma with extension into the subdural space resulting in a fairly sizable subdural hematoma on the left.  Primary Diagnosis:  Nontraumatic intracerebral hemorrhage in hemisphere, cortical  Secondary Diagnosis: Essential (primary) hypertension, Hypertension Emergency (SBP > 180 or DBP > 120 & end organ damage) and Type 2 diabetes mellitus w/o complications   Impression:  69 year old female with cortically based hematoma with subdural extension. Possible etiologies include ischemic with hemorrhagic conversion, tumor, hypertensive, avm. Given the location and the size of the subdural component, I have asked neurosurgery to comment on need for surgical intervention.   Plan: 1) Admit to ICU 2) no antiplatelets or anticoagulants 3) blood pressure control with goal systolic [id number] 4) Frequent neuro checks 5) If symptoms worsen or there is decreased mental status, repeat stat head CT 6) PT,OT,ST 7) MRI, MRA head.   Borderline DM - SSI  Hypertension - cleviprex    This patient is critically ill and at significant risk of neurological worsening, death and care requires constant monitoring of vital signs, hemodynamics,respiratory and cardiac monitoring, neurological assessment, discussion with family, other specialists and medical decision making of high complexity. I spent 50 minutes of neurocritical care time  in the care of  this patient. This was time spent independent of any time provided by nurse practitioner or PA.  Roland Rack, MD [facility] [phone]  If 7pm- 7am, please page neurology on call as listed in [place].

## 2019-08-09 NOTE — ED Provider Notes (Signed)
[facility] EMERGENCY DEPARTMENT Provider Note   CSN: [id number] Arrival date & time: 08/09/19  [id number]     History Chief Complaint  Patient presents with  . Code Stroke   LEVEL 5 CAVEAT DUE TO ACUITY OF CONDITION Michelle Carrillo is a 69 y.o. female.  The history is provided by the patient and a relative. A language interpreter was used ([place] interpreter sergio [id number]).  Altered Mental Status Presenting symptoms: confusion   Severity:  Severe Most recent episode:  Today Episode history:  Single Timing:  Constant Progression:  Worsening Chronicity:  New Patient with history of anxiety, depression, hypertension presents with altered mental status.  Daughter reports that patient went to bed after midnight.  She was found after 2 AM laying in vomit and appeared to be confused.  Patient has otherwise been at her baseline.     Past Medical History:  Diagnosis Date  . Anxiety   . Depression   . Herpes zoster kerstitis s/p corneal transplant 2015   . Hypertension   . Right knee osteoarthritis   . Strabismus     Patient Active Problem List   Diagnosis Date Noted  . Family history of uterine cancer 06/20/2019  . Healthcare maintenance 06/20/2019  . History of strabismus surgery 03/31/2019  . Left foot lesion 07/07/2018  . Depression 06/06/2018  . Right shoulder pain 06/06/2018  . 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 Osteoarthritis of right knee 06/05/2018  . Essential hypertension 06/05/2018  . Anxiety 06/05/2018  . Age-related nuclear cataract of both eyes 11/26/2014  . Diplopia 11/26/2014  . Monocular esotropia of left eye 11/26/2014  . H/O herpes zoster 01/04/2014  . Corneal transplant failure 10/27/2011    History reviewed. No pertinent surgical history.   OB History   No obstetric history on file.     History reviewed. No pertinent family history.  Social History   Tobacco Use  . Smoking  status: Never Smoker  . Smokeless tobacco: Never Used  Substance Use Topics  . Alcohol use: Never  . Drug use: Never    Home Medications Prior to Admission medications   Medication Sig Start Date End Date Taking? Authorizing Provider  Glucosamine Sulfate 1000 MG CAPS Take 1 capsule (1,000 mg total) by mouth 2 (two) times daily. 04/03/19   Hilts, Legrand Como, MD  lisinopril (ZESTRIL) 20 MG tablet Take 1 tablet by mouth once daily 06/13/19   Welford Roche, MD  meloxicam (MOBIC) 15 MG tablet Take 0.5-1 tablets (7.5-15 mg total) by mouth daily as needed for pain. 04/03/19   Hilts, Legrand Como, MD  PARoxetine (PAXIL) 20 MG tablet Take 1 tablet by mouth daily 06/26/19   Sid Falcon, MD  prednisoLONE acetate (PRED FORTE) 1 % ophthalmic suspension Apply to eye. 09/06/17   [provider]    Allergies    Patient has no known allergies.  Review of Systems   Review of Systems  Unable to perform ROS: Acuity of condition  Psychiatric/Behavioral: Positive for confusion.    Physical Exam Updated Vital Signs BP (!) 182/92 (BP Location: Right Arm)   Pulse (!) 101   Temp (!) 97.5 F (36.4 C) (Oral)   Resp 20   Ht 1.524 m (5')   Wt 83.9 kg   SpO2 95%   BMI 36.13 kg/m   Physical Exam CONSTITUTIONAL: elderly, no distress HEAD: Normocephalic/atraumatic EYES: EOMI/PERRL ENMT: Mucous membranes moist NECK: supple no meningeal signs SPINE/BACK:entire spine nontender CV:  S1/S2 noted, no murmurs/rubs/gallops noted LUNGS: Lungs are clear to auscultation bilaterally, no apparent distress ABDOMEN: soft, nontender, no rebound or guarding, bowel sounds noted throughout abdomen NEURO: Pt is awake/alert.  She appears confused. Aphasia noted. She perseverates and answers all questions with "Si" via interpreter.  Right UE drift is noted.  The rest of exam is difficult to obtain due to confusion and language barrier.   EXTREMITIES: pulses normal/equal, full ROM SKIN: warm, color normal PSYCH:  unable to assess  ED Results / Procedures / Treatments   Labs (all labs ordered are listed, but only abnormal results are displayed) Labs Reviewed  CBC - Abnormal; Notable for the following components:      Result Value   WBC 10.6 (*)    All other components within normal limits  DIFFERENTIAL - Abnormal; Notable for the following components:   Neutro Abs 8.3 (*)    All other components within normal limits  COMPREHENSIVE METABOLIC PANEL - Abnormal; Notable for the following components:   Glucose, Bld 194 (*)    BUN 25 (*)    Calcium 8.7 (*)    Alkaline Phosphatase 130 (*)    All other components within normal limits  I-STAT CHEM 8, ED - Abnormal; Notable for the following components:   BUN 28 (*)    Glucose, Bld 188 (*)    Calcium, Ion 1.07 (*)    All other components within normal limits  CBG MONITORING, ED - Abnormal; Notable for the following components:   Glucose-Capillary 182 (*)    All other components within normal limits  RESPIRATORY PANEL BY RT PCR (FLU A&B, COVID)  ETHANOL  PROTIME-INR  APTT  RAPID URINE DRUG SCREEN, HOSP PERFORMED  URINALYSIS, ROUTINE W REFLEX MICROSCOPIC  HIV ANTIBODY (ROUTINE TESTING W REFLEX)    EKG ED ECG REPORT   Date: 08/09/2019 0308am  Rate: 100  Rhythm: sinus tachycardia  QRS Axis: normal  Intervals: normal  ST/T Wave abnormalities: nonspecific ST changes  Conduction Disutrbances:none  Narrative Interpretation:   Old EKG Reviewed: unchanged  I have personally reviewed the EKG tracing and agree with the computerized printout as noted.  Radiology CT HEAD CODE STROKE WO CONTRAST  Result Date: 08/09/2019 CLINICAL DATA:  Code stroke. Initial evaluation for acute altered mental status. EXAM: CT HEAD WITHOUT CONTRAST TECHNIQUE: Contiguous axial images were obtained from the base of the skull through the vertex without intravenous contrast. COMPARISON:  None. FINDINGS: Brain: Acute intraparenchymal hemorrhage centered at the left  parietal lobe measures 3.9 x 3.7 x 3.2 cm (estimated volume 23 cc). Mild surrounding vasogenic edema. Trace adjacent subarachnoid blood. Associated subdural extension with a left subdural hematoma seen overlying the left cerebral convexity. Subdural collection measures up to 9 mm in maximal thickness. Associated mass effect with left-to-right midline shift measuring up to 11 mm. Left lateral ventricle partially effaced with asymmetric dilatation of the right lateral ventricle, concerning for early trapping. No visible transependymal flow of CSF. Mild basilar cistern crowding without transtentorial herniation. No other acute intracranial hemorrhage. No other acute large vessel territory infarct. No appreciable mass lesion. Vascular: No hyperdense vessel. Scattered vascular calcifications noted within the carotid siphons. Skull: Scalp soft tissues and calvarium within normal limits. Sinuses/Orbits: Globes and orbital soft tissues demonstrate no acute finding. Left gaze noted. Scattered mucosal thickening noted throughout the paranasal sinuses. Left maxillary sinus retention cyst no mastoid effusion. Other: None. IMPRESSION: 1. 3.9 x 3.7 x 3.2 cm (estimated volume 23 cc) acute intraparenchymal hemorrhage centered at the  left parietal convexity with mild surrounding vasogenic edema. 2. Associated subdural extension with 9 mm subdural hematoma overlying the left cerebral convexity. Associated mass effect with up to 11 mm left-to-right midline shift. 3. Asymmetric dilatation of the right lateral ventricle, concerning for early ventricular trapping. No visible transependymal flow of CSF. 4. ASPECTS: Does not apply given ICH. These results were communicated to Dr. Leonel Ramsay at 3:11 amon 4/29/2021by text page via the [facility] messaging system. Electronically Signed   By: Jeannine Boga M.D.   On: 08/09/2019 03:13     Procedures .Critical Care Performed by: Ripley Fraise, MD Authorized by: Ripley Fraise, MD     Critical care provider statement:    Critical care time (minutes):  31   Critical care start time:  08/09/2019 3:00 AM   Critical care end time:  08/09/2019 3:31 AM   Critical care time was exclusive of:  Separately billable procedures and treating other patients   Critical care was necessary to treat or prevent imminent or life-threatening deterioration of the following conditions:  CNS failure or compromise   Critical care was time spent personally by me on the following activities:  Ordering and review of laboratory studies, ordering and performing treatments and interventions, ordering and review of radiographic studies, pulse oximetry, re-evaluation of patient's condition, review of old charts, examination of patient, discussions with consultants, development of treatment plan with patient or surrogate and obtaining history from patient or surrogate   I assumed direction of critical care for this patient from another provider in my specialty: no      Medications Ordered in ED Medications  clevidipine (CLEVIPREX) infusion 0.5 mg/mL (16 mg/hr Intravenous Rate/Dose Change 08/09/19 0329)    ED Course  I have reviewed the triage vital signs and the nursing notes.  Pertinent labs & imaging results that were available during my care of the patient were reviewed by me and considered in my medical decision making (see chart for details).    MDM Rules/Calculators/A&P                       This patient presents to the ED for concern of altered mental status, this involves an extensive number of treatment options, and is a complaint that carries with it a high risk of complications and morbidity.  The differential diagnosis includes ischemic stroke, intracranial hemorrhage, subdural hematoma, UTI, meningitis, electrolyte imbalance   Lab Tests:   I Ordered, reviewed, and interpreted labs, which included electrolytes, complete blood count, coagulation panel  Medicines ordered:   I  ordered medication cleviprex  For hypertension management  Imaging Studies ordered:   I ordered imaging studies which included CT head   I independently visualized and interpreted imaging which showed intracranial hemorrhage  Additional history obtained:   Additional history obtained from daughter  Previous records obtained and reviewed   Consultations Obtained:   I consulted neurology and discussed lab and imaging findings  Reevaluation:  After the interventions stated above, I reevaluated the patient and found stable  Critical Interventions:  . Blood pressure management, intensive care unit admission   3:26 AM Patient brought to emergency department for evaluation of acute onset of altered mental status.  On my initial brief assessment, patient had difficulty following commands and was perseverating.  Full examination was limited due to language barrier.  Code stroke was initiated, and CT head reveals large intraparenchymal hemorrhage subdural extension. Patient seen with Dr. Leonel Ramsay with neurology.  Patient will be admitted to  the intensive care unit.  Patient is currently awake and alert and protecting her airway.  She will need close monitoring. 3:33 AM Neurosurgery has also been consult Patient may eventually need hematoma evacuation Patient to be admitted to neurology service/ICU She is currently protecting her airway Final Clinical Impression(s) / ED Diagnoses Final diagnoses:  Intracranial hemorrhage [facility])    Rx / DC Orders ED Discharge Orders    None       Ripley Fraise, MD 08/09/19 [phone]

## 2019-08-09 NOTE — Anesthesia Procedure Notes (Signed)
Arterial Line Insertion Start/End4/29/2021 5:37 AM, 08/09/2019 5:41 AM Performed by: Audry Pili, MD, anesthesiologist  Patient location: OR. Preanesthetic checklist: patient identified, IV checked, risks and benefits discussed, surgical consent, monitors and equipment checked, pre-op evaluation, timeout performed and anesthesia consent Lidocaine 1% used for infiltration and patient sedated Right, radial was placed Catheter size: 20 G Hand hygiene performed   Attempts: 1 Procedure performed without using ultrasound guided technique. Following insertion, dressing applied and Biopatch. Post procedure assessment: unchanged and normal  Patient tolerated the procedure well with no immediate complications.

## 2019-08-09 NOTE — Op Note (Signed)
08/09/2019  8:39 AM  PATIENT:  Michelle Carrillo  69 y.o. female whom sustained an intracerebral hematoma which extended over the brain causing a subdural hematoma. The subdural hematoma was causing approximately 1cm of left to right shift. I felt operative evacuation was necessary.   PRE-OPERATIVE DIAGNOSIS:  left subdural hematoma, acute  POST-OPERATIVE DIAGNOSIS:  Left subdural hematoma, acute  PROCEDURE:  Procedure(s): Left fronto-temporal- parietal  CRANIOTOMY for subdural hematoma evacuation  SURGEON: Surgeon(s): Ashok Pall, MD  ASSISTANTS:none  ANESTHESIA:   general  EBL:  Total I/O In: 1100 [I.V.:1100] Out: 250 [Urine:150; Blood:100]  BLOOD ADMINISTERED:none  CELL SAVER GIVEN:none  COUNT:per nursing  DRAINS: none   SPECIMEN:  No Specimen  DICTATION: INDONESIA KRUPA was taken to the operating room, intubated, and placed under a general anesthetic without difficulty. She  was positioned supine on the OR table.Once adequate anesthesia was obtained I placed his head in a three pin Mayfield head holder, then attached the head to the Mayfield adapter. Her head was shaved, was prepped and was draped in a sterile manner. I planned a reverse question mark incision starting anterior to the tragus on the left encompassing the frontal, temporal, and parietal regions. I infiltrated the planned incision with lidocaine. I opened the scalp with a 10 blade and placed raney clips for hemostasis. I divided the temporalis muscle with cautery and developed a flap of scalp and muscle reflecting it rostrally with fish hooks. I exposed the skull and fashioned a flap creating burr holes around the periphery of my exposure, then using the craniotome to create the bone flap.I elevated the flap and exposed the subdural space. I removed clotted blood overlying the brain with suction. I copiously irrigated the clot to evacuate more. The intracerebral hematoma had expressed itself, so I  evacuated the cavity also. I achieved hemostasis, had decompressed the brain, and also had good hemostasis.I placed duragen over the brain since the dura was torn with the bone flap removal. I approximated the bone flap with plates and screws. I approximated the temporalis muscle with suture, and the scalp with subgaleal sutures, suture, and staples. I placed a sterile dressing, her head was removed from the [place], and she was extubated.   PLAN OF CARE: Admit to inpatient   PATIENT DISPOSITION:  PACU - hemodynamically stable.   Delay start of Pharmacological VTE agent (>24hrs) due to surgical blood loss or risk of bleeding:  yes

## 2019-08-09 NOTE — Anesthesia Preprocedure Evaluation (Addendum)
Anesthesia Evaluation  Patient identified by MRN, date of birth, ID band Patient confused    Reviewed: Allergy & Precautions, NPO status , Patient's Chart, lab work & pertinent test results, Unable to perform ROS - Chart review onlyPreop documentation limited or incomplete due to emergent nature of procedure.  History of Anesthesia Complications Negative for: history of anesthetic complications  Airway   TM Distance: >3 FB    Comment:  Unable to complete airway exam due to mental status  Dental   Pulmonary    Pulmonary exam normal        Cardiovascular hypertension, Pt. on medications  Rhythm:Regular Rate:Tachycardia     Neuro/Psych PSYCHIATRIC DISORDERS Anxiety Depression  ICH, Subdural hematoma  CT Head - 1. 3.9 x 3.7 x 3.2 cm (estimated volume 23 cc) acute intraparenchymal hemorrhage centered at the left parietal convexity with mild surrounding vasogenic edema. 2. Associated subdural extension with 9 mm subdural hematoma overlying the left cerebral convexity. Associated mass effect with up to 11 mm left-to-right midline shift. 3. Asymmetric dilatation of the right lateral ventricle, concerning for early ventricular trapping. No visible transependymal flow of CSF.  CVA, Residual Symptoms    GI/Hepatic   Endo/Other   Obesity   Renal/GU      Musculoskeletal  (+) Arthritis ,   Abdominal   Peds  Hematology   Anesthesia Other Findings Herpes zoster keratitis s/p corneal transplant Strabismus   Reproductive/Obstetrics                            Anesthesia Physical Anesthesia Plan  ASA: IV and emergent  Anesthesia Plan: General   Post-op Pain Management:    Induction: Intravenous  PONV Risk Score and Plan: 3 and Treatment may vary due to age or medical condition and TIVA  Airway Management Planned: Oral ETT  Additional Equipment: Arterial line  Intra-op Plan:    Post-operative Plan: Post-operative intubation/ventilation  Informed Consent:     History available from chart only  Plan Discussed with: CRNA and Anesthesiologist  Anesthesia Plan Comments:       Anesthesia Quick Evaluation

## 2019-08-09 NOTE — Evaluation (Signed)
Clinical/Bedside Swallow Evaluation Patient Details  Name: Michelle Carrillo MRN: [id number] Date of Birth: 1951-01-31  Today's Date: 08/09/2019 Time: SLP Start Time (ACUTE ONLY): 1400 SLP Stop Time (ACUTE ONLY): 1425 SLP Time Calculation (min) (ACUTE ONLY): 25 min  Past Medical History:  Past Medical History:  Diagnosis Date  . Anxiety   . Depression   . Herpes zoster kerstitis s/p corneal transplant 2015   . Hypertension   . Right knee osteoarthritis   . Strabismus    Past Surgical History: History reviewed. No pertinent surgical history. HPI:  69 year old female with cortically based hematoma with subdural extension. 3.9 x 3.7 x 3.2 cm (estimated volume 23 cc) acute intraparenchymal hemorrhage centered at the left parietal convexity with mild surrounding vasogenic edema.Went to OR for urgent craniotomy and subdural hematoma evacuation on 4/29.    Assessment / Plan / Recommendation Clinical Impression  Pt demonstrates decreased recognition of cup, spoon, food and liquid, needed verbal and tactile cueing to initiate self feeding. Once started, oral phase appeared WNL with no signs of neuromuscular impairment and no signs of aspiration. Pt may start a regular diet and thin liquids but will need assist with feeding.  SLP Visit Diagnosis: Dysphagia, unspecified (R13.10)    Aspiration Risk  Mild aspiration risk    Diet Recommendation Regular;Thin liquid   Liquid Administration via: Cup;Straw Medication Administration: Whole meds with liquid Supervision: Patient able to self feed Compensations: Slow rate;Small sips/bites Postural Changes: Seated upright at 90 degrees    Other  Recommendations     Follow up Recommendations None      Frequency and Duration            Prognosis        Swallow Study   General HPI: 69 year old female with cortically based hematoma with subdural extension. 3.9 x 3.7 x 3.2 cm (estimated volume 23 cc) acute intraparenchymal hemorrhage  centered at the left parietal convexity with mild surrounding vasogenic edema.Went to OR for urgent craniotomy and subdural hematoma evacuation on 4/29.  Type of Study: Bedside Swallow Evaluation Previous Swallow Assessment: none Diet Prior to this Study: NPO Temperature Spikes Noted: No Respiratory Status: Room air History of Recent Intubation: No Behavior/Cognition: Alert;Cooperative Oral Cavity Assessment: Within Functional Limits Oral Care Completed by SLP: No Oral Cavity - Dentition: Adequate natural dentition Self-Feeding Abilities: Able to feed self Patient Positioning: Upright in bed Baseline Vocal Quality: Normal Volitional Cough: Cognitively unable to elicit Volitional Swallow: Unable to elicit    Oral/Motor/Sensory Function Overall Oral Motor/Sensory Function: Within functional limits   Ice Chips Ice chips: Within functional limits   Thin Liquid Thin Liquid: Within functional limits Presentation: Cup;Straw    Nectar Thick Nectar Thick Liquid: Not tested   Honey Thick Honey Thick Liquid: Not tested   Puree Puree: Within functional limits   Solid     Solid: Within functional limits     Herbie Baltimore, MA [place] Pager [phone] Office [phone]  Lynann Beaver 08/09/2019,2:45 PM

## 2019-08-09 NOTE — Progress Notes (Signed)
Daughter/Michelle Carrillo at bedside, brief visit/ staes mom speaking in broken sentences , does not know where she is, told her to clean the house/  Did say ' I love you" when daughter said same. Did recognize daughter and called her by name / did not follow commands except to open eyes and stick out tongue.

## 2019-08-09 NOTE — Transfer of Care (Signed)
Immediate Anesthesia Transfer of Care Note  Patient: Michelle Carrillo  Procedure(s) Performed: CRANIOTOMY HEMATOMA EVACUATION SUBDURAL (Left Head)  Patient Location: PACU  Anesthesia Type:General  Level of Consciousness: drowsy  Airway & Oxygen Therapy: Patient Spontanous Breathing and Patient connected to face mask oxygen  Post-op Assessment: Report given to RN and Post -op Vital signs reviewed and stable  Post vital signs: Reviewed and stable  Last Vitals:  Vitals Value Taken Time  BP    Temp    Pulse 82 08/09/19 0830  Resp 16 08/09/19 0830  SpO2 99 % 08/09/19 0830  Vitals shown include unvalidated device data.  Last Pain:  Vitals:   08/09/19 0238  TempSrc: Oral         Complications: No apparent anesthesia complications

## 2019-08-09 NOTE — ED Notes (Signed)
Pt daughter Murlean Iba [phone]

## 2019-08-09 NOTE — Progress Notes (Signed)
Pt has been stable/ moves all extremities strongly and opens eyes spontaneously as well as to name call/  Awaiting assistance of interpreter to better assess as she is more awake. / vvs, on cleviplex.

## 2019-08-09 NOTE — Evaluation (Signed)
Speech Language Pathology Evaluation Patient Details Name: Michelle Carrillo MRN: [id number] DOB: 12/21/1950 Today's Date: 08/09/2019 Time: [id number] SLP Time Calculation (min) (ACUTE ONLY): 25 min  Problem List:  Patient Active Problem List   Diagnosis Date Noted  . ICH (intracerebral hemorrhage) ([place]) 08/09/2019  . Subdural hematoma, acute ([place]) 08/09/2019  . Family history of uterine cancer 06/20/2019  . Healthcare maintenance 06/20/2019  . History of strabismus surgery 03/31/2019  . Left foot lesion 07/07/2018  . Depression 06/06/2018  . Right shoulder pain 06/06/2018  . 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 Osteoarthritis of right knee 06/05/2018  . Essential hypertension 06/05/2018  . Anxiety 06/05/2018  . Age-related nuclear cataract of both eyes 11/26/2014  . Diplopia 11/26/2014  . Monocular esotropia of left eye 11/26/2014  . H/O herpes zoster 01/04/2014  . Corneal transplant failure 10/27/2011   Past Medical History:  Past Medical History:  Diagnosis Date  . Anxiety   . Depression   . Herpes zoster kerstitis s/p corneal transplant 2015   . Hypertension   . Right knee osteoarthritis   . Strabismus    Past Surgical History: History reviewed. No pertinent surgical history. HPI:  69 year old female with cortically based hematoma with subdural extension. 3.9 x 3.7 x 3.2 cm (estimated volume 23 cc) acute intraparenchymal hemorrhage centered at the left parietal convexity with mild surrounding vasogenic edema.Went to OR for urgent craniotomy and subdural hematoma evacuation on 4/29.    Assessment / Plan / Recommendation Clinical Impression  Pt evaluated by Spanish speaking therapist. She demonstrates a moderate aphasia with expressive and receptive impairment, most consistent at this time with Wernicke's aphasia. Pt is alert, intially responds "si" to most questions or commands without accuracy, did not follow any simple  commands during session.  At times pt had fluent conversational speech with islands of brief comprehension and accurate language. Subsequently pt was severely perseverative and inaccurate in naming family members with eventual fluent jargon laden with perseverations and paraphasias without accuracy, very consistent with Wernicke's appearance. Pt function may fluctuate given recent surgery and edema. Introduced basic education regarding aphasia with her daughter at bedside who is very concerned. Will continue efforts to address basic comprehension and expression of wants and needs acutely. Recommend CIR at d/c.     SLP Assessment  SLP Recommendation/Assessment: Patient needs continued Speech Lanaguage Pathology Services SLP Visit Diagnosis: Dysphagia, unspecified (R13.10)    Follow Up Recommendations  None    Frequency and Duration min 2x/week  2 weeks      SLP Evaluation Cognition  Overall Cognitive Status: Impaired/Different from baseline Arousal/Alertness: Awake/alert Orientation Level: Oriented to person;Disoriented to place;Disoriented to time;Disoriented to situation Attention: Focused;Sustained Focused Attention: Appears intact Sustained Attention: Impaired Memory: (unable to assess due to aphasia) Awareness: Impaired Awareness Impairment: Intellectual impairment;Emergent impairment Problem Solving: Impaired Problem Solving Impairment: Verbal basic;Functional basic       Comprehension  Auditory Comprehension Overall Auditory Comprehension: Impaired Yes/No Questions: Impaired Basic Biographical Questions: 0-25% accurate Commands: Impaired One Step Basic Commands: 0-24% accurate Conversation: Simple Interfering Components: Visual impairments Reading Comprehension Reading Status: Not tested    Expression Verbal Expression Overall Verbal Expression: Impaired Initiation: No impairment Automatic Speech: Name;Social Response Level of Generative/Spontaneous Verbalization:  Conversation Repetition: Impaired Level of Impairment: Word level Naming: Impairment Responsive: Not tested Confrontation: Impaired Convergent: Not tested Divergent: Not tested Verbal Errors: Not aware of errors;Jargon;Perseveration Effective Techniques: Semantic cues Written Expression Dominant Hand: Right Written Expression: Not tested  Oral / Motor  Oral Motor/Sensory Function Overall Oral Motor/Sensory Function: Within functional limits(cannot follow oral motor commands) Motor Speech Overall Motor Speech: Appears within functional limits for tasks assessed   GO                   Herbie Baltimore, MA CCC-SLP  Acute Rehabilitation Services Pager [phone] Office [phone]  Lynann Beaver 08/09/2019, 3:05 PM

## 2019-08-09 NOTE — ED Triage Notes (Signed)
Pt BIB daughter for eval of of confusion, aphasia and vomiting. Daughter reports she went to be at 0000 on 4/29 and was found at approx 0200 laying in vomit and confused. Activated as Code Stroke from triage

## 2019-08-09 NOTE — Consult Note (Signed)
Reason for Consult:acute subdural hematoma, left parietal occipital ICH Referring Physician: Lashel Jentzen is an 69 y.o. female.  HPI: whom was in her usual health until this past evening. She had an episode of emesis, slurred speech, and confusion. Brought to the ED Last seen normal at 0100 08/09/2019 Past Medical History:  Diagnosis Date  . Anxiety   . Depression   . Herpes zoster kerstitis s/p corneal transplant 2015   . Hypertension   . Right knee osteoarthritis   . Strabismus     History reviewed. No pertinent surgical history.  History reviewed. No pertinent family history.  Social History:  reports that she has never smoked. She has never used smokeless tobacco. She reports that she does not drink alcohol or use drugs.  Allergies: No Known Allergies  Medications: I have reviewed the patient's current medications.  Results for orders placed or performed during the hospital encounter of 08/09/19 (from the past 48 hour(s))  CBG monitoring, ED     Status: Abnormal   Collection Time: 08/09/19  2:35 AM  Result Value Ref Range   Glucose-Capillary 182 (H) 70 - 99 mg/dL    Comment: Glucose reference range applies only to samples taken after fasting for at least 8 hours.   Comment 1 Notify RN    Comment 2 Document in Chart   Ethanol     Status: None   Collection Time: 08/09/19  2:47 AM  Result Value Ref Range   Alcohol, Ethyl (B) <10 <10 mg/dL    Comment: (NOTE) Lowest detectable limit for serum alcohol is 10 mg/dL. For medical purposes only. Performed at [facility], [place] [address], [place], [place] [address]   Protime-INR     Status: None   Collection Time: 08/09/19  2:47 AM  Result Value Ref Range   Prothrombin Time 12.5 11.4 - 15.2 seconds   INR 1.0 0.8 - 1.2    Comment: (NOTE) INR goal varies based on device and disease states. Performed at [facility], [place] [address], [place], [place] [address]   APTT     Status: None    Collection Time: 08/09/19  2:47 AM  Result Value Ref Range   aPTT 27 24 - 36 seconds    Comment: Performed at [facility] [address], [place], [place] [address]  CBC     Status: Abnormal   Collection Time: 08/09/19  2:47 AM  Result Value Ref Range   WBC 10.6 (H) 4.0 - 10.5 K/uL   RBC 4.34 3.87 - 5.11 MIL/uL   Hemoglobin 13.6 12.0 - 15.0 g/dL   HCT 41.1 36.0 - 46.0 %   MCV 94.7 80.0 - 100.0 fL   MCH 31.3 26.0 - 34.0 pg   MCHC 33.1 30.0 - 36.0 g/dL   RDW 12.8 11.5 - 15.5 %   Platelets 283 150 - 400 K/uL   nRBC 0.0 0.0 - 0.2 %    Comment: Performed at [facility], [place] [address] [place],  [address]  Differential     Status: Abnormal   Collection Time: 08/09/19  2:47 AM  Result Value Ref Range   Neutrophils Relative % 78 %   Neutro Abs 8.3 (H) 1.7 - 7.7 K/uL   Lymphocytes Relative 16 %   Lymphs Abs 1.7 0.7 - 4.0 K/uL   Monocytes Relative 5 %   Monocytes Absolute 0.5 0.1 - 1.0 K/uL   Eosinophils Relative 1 %  Eosinophils Absolute 0.1 0.0 - 0.5 K/uL   Basophils Relative 0 %   Basophils Absolute 0.0 0.0 - 0.1 K/uL   Immature Granulocytes 0 %   Abs Immature Granulocytes 0.02 0.00 - 0.07 K/uL    Comment: Performed at [facility], [place] [address], [place], [place] [address]  Comprehensive metabolic panel     Status: Abnormal   Collection Time: 08/09/19  2:47 AM  Result Value Ref Range   Sodium 139 135 - 145 mmol/L   Potassium 3.8 3.5 - 5.1 mmol/L   Chloride 104 98 - 111 mmol/L   CO2 24 22 - 32 mmol/L   Glucose, Bld 194 (H) 70 - 99 mg/dL    Comment: Glucose reference range applies only to samples taken after fasting for at least 8 hours.   BUN 25 (H) 8 - 23 mg/dL   Creatinine, Ser 0.75 0.44 - 1.00 mg/dL   Calcium 8.7 (L) 8.9 - 10.3 mg/dL   Total Protein 7.4 6.5 - 8.1 g/dL   Albumin 3.5 3.5 - 5.0 g/dL   AST 28 15 - 41 U/L   ALT 24 0 - 44 U/L   Alkaline Phosphatase 130 (H) 38 - 126 U/L   Total Bilirubin 0.6 0.3 - 1.2 mg/dL   GFR calc non Af  Amer >60 >60 mL/min   GFR calc Af Amer >60 >60 mL/min   Anion gap 11 5 - 15    Comment: Performed at [facility] [address], [place], [place] [address]  I-stat chem 8, ED     Status: Abnormal   Collection Time: 08/09/19  2:57 AM  Result Value Ref Range   Sodium 139 135 - 145 mmol/L   Potassium 3.8 3.5 - 5.1 mmol/L   Chloride 101 98 - 111 mmol/L   BUN 28 (H) 8 - 23 mg/dL   Creatinine, Ser 0.70 0.44 - 1.00 mg/dL   Glucose, Bld 188 (H) 70 - 99 mg/dL    Comment: Glucose reference range applies only to samples taken after fasting for at least 8 hours.   Calcium, Ion 1.07 (L) 1.15 - 1.40 mmol/L   TCO2 27 22 - 32 mmol/L   Hemoglobin 13.6 12.0 - 15.0 g/dL   HCT 40.0 36.0 - 46.0 %  Respiratory Panel by RT PCR (Flu A&B, Covid) - Nasopharyngeal Swab     Status: None   Collection Time: 08/09/19  3:43 AM   Specimen: Nasopharyngeal Swab  Result Value Ref Range   SARS Coronavirus 2 by RT PCR NEGATIVE NEGATIVE    Comment: (NOTE) SARS-CoV-2 target nucleic acids are NOT DETECTED. The SARS-CoV-2 RNA is generally detectable in upper respiratoy specimens during the acute phase of infection. The lowest concentration of SARS-CoV-2 viral copies this assay can detect is 131 copies/mL. A negative result does not preclude SARS-Cov-2 infection and should not be used as the sole basis for treatment or other patient management decisions. A negative result may occur with  improper specimen collection/handling, submission of specimen other than nasopharyngeal swab, presence of viral mutation(s) within the areas targeted by this assay, and inadequate number of viral copies (<131 copies/mL). A negative result must be combined with clinical observations, patient history, and epidemiological information. The expected result is Negative. Fact Sheet for Patients:  PinkCheek.be Fact Sheet for Healthcare Providers:  GravelBags.it This test is not  yet ap proved or cleared by the [place] FDA and  has been authorized for detection and/or diagnosis of SARS-CoV-2 by FDA  under an Emergency Use Authorization (EUA). This EUA will remain  in effect (meaning this test can be used) for the duration of the COVID-19 declaration under Section 564(b)(1) of the Act, 21 U.S.C. section 360bbb-3(b)(1), unless the authorization is terminated or revoked sooner.    Influenza A by PCR NEGATIVE NEGATIVE   Influenza B by PCR NEGATIVE NEGATIVE    Comment: (NOTE) The Xpert Xpress SARS-CoV-2/FLU/RSV assay is intended as an aid in  the diagnosis of influenza from Nasopharyngeal swab specimens and  should not be used as a sole basis for treatment. Nasal washings and  aspirates are unacceptable for Xpert Xpress SARS-CoV-2/FLU/RSV  testing. Fact Sheet for Patients: PinkCheek.be Fact Sheet for Healthcare Providers: GravelBags.it This test is not yet approved or cleared by the [place] FDA and  has been authorized for detection and/or diagnosis of SARS-CoV-2 by  FDA under an Emergency Use Authorization (EUA). This EUA will remain  in effect (meaning this test can be used) for the duration of the  Covid-19 declaration under Section 564(b)(1) of the Act, 21  U.S.C. section 360bbb-3(b)(1), unless the authorization is  terminated or revoked. Performed at [facility], [place] [address], [place],  [address]     CT HEAD CODE STROKE WO CONTRAST  Result Date: 08/09/2019 CLINICAL DATA:  Code stroke. Initial evaluation for acute altered mental status. EXAM: CT HEAD WITHOUT CONTRAST TECHNIQUE: Contiguous axial images were obtained from the base of the skull through the vertex without intravenous contrast. COMPARISON:  None. FINDINGS: Brain: Acute intraparenchymal hemorrhage centered at the left parietal lobe measures 3.9 x 3.7 x 3.2 cm (estimated volume 23 cc). Mild surrounding vasogenic  edema. Trace adjacent subarachnoid blood. Associated subdural extension with a left subdural hematoma seen overlying the left cerebral convexity. Subdural collection measures up to 9 mm in maximal thickness. Associated mass effect with left-to-right midline shift measuring up to 11 mm. Left lateral ventricle partially effaced with asymmetric dilatation of the right lateral ventricle, concerning for early trapping. No visible transependymal flow of CSF. Mild basilar cistern crowding without transtentorial herniation. No other acute intracranial hemorrhage. No other acute large vessel territory infarct. No appreciable mass lesion. Vascular: No hyperdense vessel. Scattered vascular calcifications noted within the carotid siphons. Skull: Scalp soft tissues and calvarium within normal limits. Sinuses/Orbits: Globes and orbital soft tissues demonstrate no acute finding. Left gaze noted. Scattered mucosal thickening noted throughout the paranasal sinuses. Left maxillary sinus retention cyst no mastoid effusion. Other: None. IMPRESSION: 1. 3.9 x 3.7 x 3.2 cm (estimated volume 23 cc) acute intraparenchymal hemorrhage centered at the left parietal convexity with mild surrounding vasogenic edema. 2. Associated subdural extension with 9 mm subdural hematoma overlying the left cerebral convexity. Associated mass effect with up to 11 mm left-to-right midline shift. 3. Asymmetric dilatation of the right lateral ventricle, concerning for early ventricular trapping. No visible transependymal flow of CSF. 4. ASPECTS: Does not apply given ICH. These results were communicated to Dr. Leonel Ramsay at 3:11 amon 4/29/2021by text page via the [facility] messaging system. Electronically Signed   By: Jeannine Boga M.D.   On: 08/09/2019 03:13    Review of Systems  HENT:       Headache  Gastrointestinal: Positive for vomiting.  Skin: Negative.   Neurological: Positive for facial asymmetry and speech difficulty.   Psychiatric/Behavioral: Positive for confusion.   Blood pressure (!) 114/48, pulse 90, temperature (!) 97.5 F (36.4 C), temperature source Oral, resp. rate 20, height 5' (1.524 m), weight 83.5 kg,  SpO2 98 %. Physical Exam  Constitutional: She appears well-developed and well-nourished. She appears distressed.  HENT:  Head: Normocephalic and atraumatic.  Musculoskeletal:        General: Normal range of motion.  Neurological: She is alert. GCS eye subscore is 4. GCS verbal subscore is 5. GCS motor subscore is 6. She displays no Babinski's sign on the right side. She displays no Babinski's sign on the left side.  Confused, perseverating Follows some commands Moving all extremities  Cannot do detailed sensory exam Perrl, full eom. Very mild left ptosis Gait not assessed Could not adequately test visual fields Strength essentially normal  Skin: Skin is warm and dry.    Assessment/Plan: OR for urgent craniotomy and subdural hematoma evacuation.   Ashok Pall 08/09/2019, 4:37 AM

## 2019-08-09 NOTE — Progress Notes (Signed)
PT Cancellation Note  Patient Details Name: NANINE DYSINGER MRN: [id number] DOB: 1951/02/18   Cancelled Treatment:    Reason Eval/Treat Not Completed: Active bedrest order;Patient not medically ready   Lizzeth Meder B Unique Sillas 08/09/2019, 7:24 AM Bayard Males, PT Acute Rehabilitation Services Pager: [phone] Office: [phone]

## 2019-08-09 NOTE — ED Notes (Signed)
IV infiltrated, pharmacy consulted and advised to place warm compresses

## 2019-08-09 NOTE — Progress Notes (Signed)
Sylvia/Interpreter Services at bedside. After a series of questions to ascertain orientation, ability to follow add'l commands, sensation, events re: her being here, etc, and pt answered ' Si" to all questions. Her singular correct answer was her birthdate.. She  Continues to use both upper extremities to  Feel head dressing, adjust blankets and lies in bed with ankles crossed. Calm.

## 2019-08-09 NOTE — ED Notes (Signed)
Harvel Ricks [id number] daughter would like call with updates

## 2019-08-10 ENCOUNTER — Encounter (HOSPITAL_COMMUNITY): Payer: Self-pay | Admitting: Neurosurgery

## 2019-08-10 DIAGNOSIS — I611 Nontraumatic intracerebral hemorrhage in hemisphere, cortical: Secondary | ICD-10-CM | POA: Diagnosis not present

## 2019-08-10 DIAGNOSIS — F329 Major depressive disorder, single episode, unspecified: Secondary | ICD-10-CM

## 2019-08-10 DIAGNOSIS — F419 Anxiety disorder, unspecified: Secondary | ICD-10-CM | POA: Diagnosis not present

## 2019-08-10 DIAGNOSIS — I629 Nontraumatic intracranial hemorrhage, unspecified: Secondary | ICD-10-CM | POA: Insufficient documentation

## 2019-08-10 DIAGNOSIS — I10 Essential (primary) hypertension: Secondary | ICD-10-CM | POA: Diagnosis not present

## 2019-08-10 DIAGNOSIS — S065X9A Traumatic subdural hemorrhage with loss of consciousness of unspecified duration, initial encounter: Secondary | ICD-10-CM

## 2019-08-10 DIAGNOSIS — D72829 Elevated white blood cell count, unspecified: Secondary | ICD-10-CM

## 2019-08-10 DIAGNOSIS — F32A Depression, unspecified: Secondary | ICD-10-CM | POA: Diagnosis present

## 2019-08-10 LAB — GLUCOSE, CAPILLARY
Glucose-Capillary: 106 mg/dL — ABNORMAL HIGH (ref 70–99)
Glucose-Capillary: 113 mg/dL — ABNORMAL HIGH (ref 70–99)
Glucose-Capillary: 122 mg/dL — ABNORMAL HIGH (ref 70–99)
Glucose-Capillary: 131 mg/dL — ABNORMAL HIGH (ref 70–99)
Glucose-Capillary: 154 mg/dL — ABNORMAL HIGH (ref 70–99)
Glucose-Capillary: 67 mg/dL — ABNORMAL LOW (ref 70–99)
Glucose-Capillary: 97 mg/dL (ref 70–99)

## 2019-08-10 MED ORDER — LEVETIRACETAM 500 MG PO TABS
500.0000 mg | ORAL_TABLET | Freq: Two times a day (BID) | ORAL | Status: DC
  Administered 2019-08-10 – 2019-08-17 (×14): 500 mg via ORAL
  Filled 2019-08-10 (×15): qty 1

## 2019-08-10 MED ORDER — LISINOPRIL 20 MG PO TABS
20.0000 mg | ORAL_TABLET | Freq: Every day | ORAL | Status: DC
  Administered 2019-08-10: 20 mg via ORAL
  Filled 2019-08-10: qty 1

## 2019-08-10 MED ORDER — DEXTROSE 50 % IV SOLN
INTRAVENOUS | Status: AC
  Administered 2019-08-10: 25 mL via INTRAVENOUS
  Filled 2019-08-10: qty 50

## 2019-08-10 MED ORDER — PANTOPRAZOLE SODIUM 40 MG PO TBEC
40.0000 mg | DELAYED_RELEASE_TABLET | Freq: Every day | ORAL | Status: DC
  Administered 2019-08-10 – 2019-08-22 (×13): 40 mg via ORAL
  Filled 2019-08-10 (×13): qty 1

## 2019-08-10 MED ORDER — PAROXETINE HCL 20 MG PO TABS
20.0000 mg | ORAL_TABLET | Freq: Every day | ORAL | Status: DC
  Administered 2019-08-10 – 2019-08-23 (×14): 20 mg via ORAL
  Filled 2019-08-10 (×15): qty 1

## 2019-08-10 MED ORDER — DEXTROSE 50 % IV SOLN: 12.5000 g | INTRAVENOUS | Status: AC

## 2019-08-10 NOTE — Evaluation (Signed)
Physical Therapy Evaluation Patient Details Name: Michelle Carrillo MRN: [id number] DOB: January 17, 1951 Today's Date: 08/10/2019   History of Present Illness  69 y.o. female the history of hypertension who presents with altered mental status that started abruptly this evening. Pt's daughter noticed the pt had vomited and that her cognition was altered. Pt found to have acute SDH and L parietal occipital ICH. Pt underwent Left fronto-temporal- parietal   Clinical Impression  Pt presents to PT with deficits in functional mobility, strength, balance, power, gait, endurance, cognition, and communication. Pt with significant aphasia, with pieces of receptive and expressive aphasias noted during session. Pt perseverates on not having much pain in her head and later on having 2 children after asked questions about these topics. Pt does not follow verbal commands for motor tasks, but is able to follow through with motor tasks with physical initiation of the tasks. Pt also noted to have L foot tremor/twitching when not weightbearing through that side. Pt will benefit from continued aggressive mobilization and PT POC to reduce falls risk and aide in a return to the patient's PLOF (needs to be confirmed by family).    Follow Up Recommendations CIR;Supervision/Assistance - 24 hour    Equipment Recommendations  Wheelchair (measurements PT);Wheelchair cushion (measurements PT)    Recommendations for Other Services Rehab consult     Precautions / Restrictions Precautions Precautions: Fall Restrictions Weight Bearing Restrictions: No      Mobility  Bed Mobility Overal bed mobility: Needs Assistance Bed Mobility: Supine to Sit     Supine to sit: Max assist        Transfers Overall transfer level: Needs assistance Equipment used: 2 person hand held assist Transfers: Sit to/from Stand Sit to Stand: Mod assist;+2 physical assistance            Ambulation/Gait Ambulation/Gait assistance:  Mod assist;+2 physical assistance Gait Distance (Feet): 3 Feet Assistive device: 2 person hand held assist Gait Pattern/deviations: Shuffle Gait velocity: reduced Gait velocity interpretation: <1.31 ft/sec, indicative of household ambulator General Gait Details: pt with short shuffling steps from bed to recliner, PT providing significant tactile cueing for sequencing and direction to recliner  Stairs            Wheelchair Mobility    Modified Rankin (Stroke Patients Only) Modified Rankin (Stroke Patients Only) Pre-Morbid Rankin Score: No symptoms Modified Rankin: Moderately severe disability     Balance Overall balance assessment: Needs assistance Sitting-balance support: Single extremity supported;Feet unsupported Sitting balance-Leahy Scale: Fair Sitting balance - Comments: minA sitting at edge of bed   Standing balance support: Bilateral upper extremity supported Standing balance-Leahy Scale: Poor Standing balance comment: minA x2 for static standing balance                             Pertinent Vitals/Pain Pain Assessment: Faces Faces Pain Scale: Hurts little more Pain Location: head Pain Descriptors / Indicators: Aching Pain Intervention(s): Monitored during session    Home Living Family/patient expects to be discharged to:: Private residence Living Arrangements: Children Available Help at Discharge: Family Type of Home: (unable to determine 2/2 aphasia)           Additional Comments: further home information needs to be confirmed with family 2/2 aphasia    Prior Function           Comments: pt reports she is able to walk at baseline, otherwise unable to accurately determine 2/2 communication and cognition deficits  Hand Dominance        Extremity/Trunk Assessment   Upper Extremity Assessment Upper Extremity Assessment: Defer to OT evaluation    Lower Extremity Assessment Lower Extremity Assessment: RLE deficits/detail;LLE  deficits/detail RLE Deficits / Details: BLE at least 3/5 based on functional mobility. Unable to assess sensation or strength more due to impaired cognition and communication LLE Deficits / Details: BLE at least 3/5 based on functional mobility. Unable to assess sensation or strength more due to impaired cognition and communication. Pt with tremor and tapping of L foot majority of time in sitting    Cervical / Trunk Assessment Cervical / Trunk Assessment: Kyphotic  Communication   Communication: Receptive difficulties;Expressive difficulties;Prefers language other than English  Cognition Arousal/Alertness: Awake/alert Behavior During Therapy: Flat affect Overall Cognitive Status: No family/caregiver present to determine baseline cognitive functioning                                 General Comments: pt perseverates on having only a little pain in her head. Then later perseverates on having 2 children when asked. Pt responds ok to many questions. Pt with significant aphasia at this time. Pt unable to follow verbal commands for motor tasks, requires physical initiation into task to then follow through.      General Comments General comments (skin integrity, edema, etc.): Pt hypertensive during session with BP of 167/84 in sitting, pt appears in no significant distress, later in session A-line reading in 0000000 systolic. Pt resting in recliner at end of session. Spanish interpreter utilized over Dietitian, PT anticipates live interpreter will be more effective 2/2 aphasia    Exercises     Assessment/Plan    PT Assessment Patient needs continued PT services  PT Problem List Decreased strength;Decreased balance;Decreased activity tolerance;Decreased mobility;Decreased cognition;Decreased coordination;Decreased knowledge of use of DME;Decreased safety awareness;Decreased knowledge of precautions;Pain       PT Treatment Interventions DME instruction;Gait training;Stair  training;Functional mobility training;Therapeutic activities;Therapeutic exercise;Balance training;Neuromuscular re-education;Cognitive remediation;Patient/family education    PT Goals (Current goals can be found in the Care Plan section)  Acute Rehab PT Goals Patient Stated Goal: Pt unable to state, PT goal to improve mobility PT Goal Formulation: Patient unable to participate in goal setting Time For Goal Achievement: 08/24/19 Potential to Achieve Goals: Good Additional Goals Additional Goal #1: Pt will maintain dynamic standing balance within 6 inches of her base of support with unilateral UE support and minA.    Frequency Min 4X/week   Barriers to discharge        Co-evaluation PT/OT/SLP Co-Evaluation/Treatment: Yes Reason for Co-Treatment: Complexity of the patient's impairments (multi-system involvement);Necessary to address cognition/behavior during functional activity;For patient/therapist safety;To address functional/ADL transfers PT goals addressed during session: Mobility/safety with mobility;Balance;Strengthening/ROM         AM-PAC PT "6 Clicks" Mobility  Outcome Measure Help needed turning from your back to your side while in a flat bed without using bedrails?: Total Help needed moving from lying on your back to sitting on the side of a flat bed without using bedrails?: Total Help needed moving to and from a bed to a chair (including a wheelchair)?: A Lot Help needed standing up from a chair using your arms (e.g., wheelchair or bedside chair)?: A Lot Help needed to walk in hospital room?: A Lot Help needed climbing 3-5 steps with a railing? : Total 6 Click Score: 9    End of Session   Activity  Tolerance: Patient tolerated treatment well Patient left: in chair;with call bell/phone within reach;with chair alarm set Nurse Communication: Mobility status PT Visit Diagnosis: Unsteadiness on feet (R26.81);Muscle weakness (generalized) (M62.81);Other symptoms and signs  involving the nervous system (R29.898)    Time: [id number] PT Time Calculation (min) (ACUTE ONLY): 22 min   Charges:   PT Evaluation $PT Eval Moderate Complexity: 1 Mod          Zenaida Niece, PT, DPT Acute Rehabilitation Pager: [phone]   Zenaida Niece 08/10/2019, 9:35 AM

## 2019-08-10 NOTE — Social Work (Signed)
CSW was unable to complete sbirt due to pt not being oriented. CSW may attempt to complete at more appropriate time.   Leta Bucklin, LCSWA, LCASA Clinical Social Worker [phone]    

## 2019-08-10 NOTE — Evaluation (Signed)
Occupational Therapy Evaluation Patient Details Name: Michelle Carrillo MRN: [id number] DOB: [birth year] Today's Date: 08/10/2019    History of Present Illness 69 y.o. female the history of hypertension who presents with altered mental status that started abruptly this evening. Pt's daughter noticed the pt had vomited and that her cognition was altered. Pt found to have acute SDH and L parietal occipital ICH. Pt underwent Left fronto-temporal- parietal    Clinical Impression   Upon arrival, pt sleeping and supine in bed. Pt with difficulty providing home information and PLOF as pt with aphasia and perseverating on topics of "little pain" and "I have two kids". Use of video interpreter for spanish. Pt requiring Max tactile cues for following of commands. Presenting with decreased balance, strength, attention to R, and activity tolerance. Pt currently requiring Max A for ADLs and functional mobility due to decreased balance and cognition. Pt would benefit from further acute OT to facilitate safe dc. Recommend dc to CIR for further OT to optimize safety, independence with ADLs, and return to PLOF.     Follow Up Recommendations  CIR    Equipment Recommendations  Other (comment)(Defer to next venue)    Recommendations for Other Services PT consult;Rehab consult;Speech consult     Precautions / Restrictions Precautions Precautions: Fall Restrictions Weight Bearing Restrictions: No      Mobility Bed Mobility Overal bed mobility: Needs Assistance Bed Mobility: Supine to Sit     Supine to sit: Max assist     General bed mobility comments: Max A to bring BLEs towards EOB and then elevate trunk  Transfers Overall transfer level: Needs assistance Equipment used: 2 person hand held assist Transfers: Sit to/from Stand Sit to Stand: Mod assist;+2 physical assistance         General transfer comment: Mod A +2 to power up into standing    Balance Overall balance assessment:  Needs assistance Sitting-balance support: Single extremity supported;Feet unsupported Sitting balance-Leahy Scale: Fair Sitting balance - Comments: minA sitting at edge of bed   Standing balance support: Bilateral upper extremity supported Standing balance-Leahy Scale: Poor Standing balance comment: minA x2 for static standing balance                           ADL either performed or assessed with clinical judgement   ADL Overall ADL's : Needs assistance/impaired Eating/Feeding: Minimal assistance;Sitting   Grooming: Maximal assistance;Sitting Grooming Details (indicate cue type and reason): Poor following of commands even when presented with ADLs items Upper Body Bathing: Maximal assistance;Sitting   Lower Body Bathing: Maximal assistance;Sit to/from stand   Upper Body Dressing : Maximal assistance;Sitting   Lower Body Dressing: Maximal assistance;Sit to/from stand Lower Body Dressing Details (indicate cue type and reason): Pt with decreased following of commands to don socks. Despite Max verbal cues, tactiles cues, and hand over hand to intiate task.  Toilet Transfer: Moderate assistance;Ambulation(short distance simulated to recliner) Toilet Transfer Details (indicate cue type and reason): Mod A +2 with hand held A to mobilize to recliner. Assistance for balance and power up         Functional mobility during ADLs: Moderate assistance;+2 for physical assistance General ADL Comments: Pt with decreased balance, strength, cognition, and actiivty tolerance     Vision Patient Visual Report: Other (comment)(Difficulty answering PLOF questions) Additional Comments: Noting L eye swollen. Will need to continue to assess. Pt also presenting with decreased attention to R.     Perception  Praxis      Pertinent Vitals/Pain Pain Assessment: Faces Faces Pain Scale: Hurts little more Pain Location: head Pain Descriptors / Indicators: Aching Pain Intervention(s):  Monitored during session;Limited activity within patient's tolerance;Repositioned     Hand Dominance Right   Extremity/Trunk Assessment Upper Extremity Assessment Upper Extremity Assessment: Generalized weakness;Difficult to assess due to impaired cognition   Lower Extremity Assessment Lower Extremity Assessment: Defer to PT evaluation RLE Deficits / Details: BLE at least 3/5 based on functional mobility. Unable to assess sensation or strength more due to impaired cognition and communication LLE Deficits / Details: BLE at least 3/5 based on functional mobility. Unable to assess sensation or strength more due to impaired cognition and communication. Pt with tremor and tapping of L foot majority of time in sitting   Cervical / Trunk Assessment Cervical / Trunk Assessment: Kyphotic   Communication Communication Communication: Receptive difficulties;Expressive difficulties;Prefers language other than English   Cognition Arousal/Alertness: Awake/alert Behavior During Therapy: Flat affect Overall Cognitive Status: No family/caregiver present to determine baseline cognitive functioning                                 General Comments: pt perseverates on having only a little pain in her head. Then later perseverates on having 2 children when asked. Pt responds ok to many questions. Pt with significant aphasia at this time. Pt unable to follow verbal commands for motor tasks, requires physical initiation into task to then follow through.   General Comments  Pt hypertensive during session with BP of 167/84 in sitting, pt appears in no significant distress, later in session A-line reading in 0000000 systolic. Pt resting in recliner at end of session. Spanish interpreter utilized over video chat Katharina Caper [phone]), PT anticipates live interpreter will be more effective 2/2 aphasia    Exercises     Shoulder Instructions      Home Living Family/patient expects to be discharged to::  Private residence Living Arrangements: Children Available Help at Discharge: Family Type of Home: (unable to determine 2/2 aphasia)                           Additional Comments: further home information needs to be confirmed with family 2/2 aphasia      Prior Functioning/Environment          Comments: pt reports she is able to walk at baseline, otherwise unable to accurately determine 2/2 communication and cognition deficits        OT Problem List: Decreased strength;Decreased activity tolerance;Decreased range of motion;Impaired balance (sitting and/or standing);Decreased knowledge of use of DME or AE;Decreased knowledge of precautions;Pain;Decreased cognition;Decreased safety awareness      OT Treatment/Interventions: Self-care/ADL training;Therapeutic exercise;Energy conservation;DME and/or AE instruction;Therapeutic activities;Patient/family education    OT Goals(Current goals can be found in the care plan section) Acute Rehab OT Goals Patient Stated Goal: Unable to state with aphasia OT Goal Formulation: Patient unable to participate in goal setting Time For Goal Achievement: 08/24/19 Potential to Achieve Goals: Good  OT Frequency: Min 2X/week   Barriers to D/C:            Co-evaluation PT/OT/SLP Co-Evaluation/Treatment: Yes Reason for Co-Treatment: Complexity of the patient's impairments (multi-system involvement);For patient/therapist safety;To address functional/ADL transfers PT goals addressed during session: Mobility/safety with mobility;Balance;Strengthening/ROM OT goals addressed during session: ADL's and self-care      AM-PAC OT "6 Clicks" Daily Activity  Outcome Measure Help from another person eating meals?: A Little Help from another person taking care of personal grooming?: A Lot Help from another person toileting, which includes using toliet, bedpan, or urinal?: A Lot Help from another person bathing (including washing, rinsing,  drying)?: A Lot Help from another person to put on and taking off regular upper body clothing?: A Lot Help from another person to put on and taking off regular lower body clothing?: A Lot 6 Click Score: 13   End of Session Nurse Communication: Mobility status  Activity Tolerance: Patient limited by fatigue Patient left: in chair;with call bell/phone within reach;with chair alarm set  OT Visit Diagnosis: Unsteadiness on feet (R26.81);Other abnormalities of gait and mobility (R26.89);Muscle weakness (generalized) (M62.81);Other symptoms and signs involving cognitive function                Time: [id number] OT Time Calculation (min): 21 min Charges:  OT General Charges $OT Visit: 1 Visit OT Evaluation $OT Eval Moderate Complexity: [place], OTR/L Acute Rehab Pager: [phone] Office: [place] 08/10/2019, 10:44 AM

## 2019-08-10 NOTE — Consult Note (Addendum)
Physical Medicine and Rehabilitation Consult   Reason for Consult: Functional decline.  Referring Physician: Dr. Leonie Man   HPI: Michelle Carrillo is a 69 y.o. non-english female with history of HTN, anxiety/depression who was admitted on 08/09/2019 dysarthria, confusion, vomiting.  History taken from chart review, daughters, and nursing due to cognition and language.  Head CT was performed, which showed left large intraparenchymal hemorrhage with SDH.  Per report, CT head showed 3.9 cm X 3.7 cm X 3.2 cm IPH with associated SDH and 11 mm left to right min shift with asymmetric dilatation of right lateral ventricle.  She was evaluated by Dr. Christella Noa and underwent emergent left frontal temporal parietal craniotomy for evacuation of SDH.  Her mentation improved post op and follow up  MRI/MRA brain revealed small residual SDH with improvement in mass effect to 46mm and left parietal IPH unchanged.  Hospital course complicated by Wernicke's aphasia.  Therapy evaluations done revealing Wernicke's aphasia with perseveration as well as difficulty with mobility. CIR recommended due to functional decline.   Review of Systems  Unable to perform ROS: Mental acuity   Past Medical History:  Diagnosis Date  . Anxiety   . Depression   . Herpes zoster kerstitis s/p corneal transplant 2015   . Hypertension   . Right knee osteoarthritis   . Strabismus     Past Surgical History:  Procedure Laterality Date  . CORNEAL TRANSPLANT Left    2006 and 2016     Family History  Problem Relation Age of Onset  . Diabetes Mother   . Ovarian cancer Mother   . High blood pressure Sister   . Diabetes Sister   . Diabetes Brother      Social History: Lives with husband and daughter. She was independent but sedentary PTA.  Per reports that she has never smoked. She has never used smokeless tobacco. Per reports that she does not drink alcohol or use drugs.    Allergies: No Known Allergies    Medications  Prior to Admission  Medication Sig Dispense Refill  . cetirizine (ZYRTEC) 10 MG tablet Take 10 mg by mouth daily as needed for allergies.    . Glucosamine Sulfate 1000 MG CAPS Take 1 capsule (1,000 mg total) by mouth 2 (two) times daily. 180 capsule 3  . lisinopril (ZESTRIL) 20 MG tablet Take 1 tablet by mouth once daily (Patient taking differently: Take 20 mg by mouth daily. ) 90 tablet 1  . meloxicam (MOBIC) 15 MG tablet Take 0.5-1 tablets (7.5-15 mg total) by mouth daily as needed for pain. 30 tablet 6  . PARoxetine (PAXIL) 20 MG tablet Take 1 tablet by mouth daily (Patient taking differently: Take 20 mg by mouth daily. ) 90 tablet 0    Home: Home Living Family/patient expects to be discharged to:: Private residence Living Arrangements: Children Available Help at Discharge: Family Type of Home: (unable to determine 2/2 aphasia) Additional Comments: further home information needs to be confirmed with family 2/2 aphasia  Lives With: Family  Functional History: Prior Function Comments: pt reports she is able to walk at baseline, otherwise unable to accurately determine 2/2 communication and cognition deficits Functional Status:  Mobility: Bed Mobility Overal bed mobility: Needs Assistance Bed Mobility: Supine to Sit Supine to sit: Max assist General bed mobility comments: Max A to bring BLEs towards EOB and then elevate trunk Transfers Overall transfer level: Needs assistance Equipment used: 2 person hand held assist Transfers: Sit to/from Stand Sit to  Stand: Mod assist, +2 physical assistance General transfer comment: Mod A +2 to power up into standing Ambulation/Gait Ambulation/Gait assistance: Mod assist, +2 physical assistance Gait Distance (Feet): 3 Feet Assistive device: 2 person hand held assist Gait Pattern/deviations: Shuffle General Gait Details: pt with short shuffling steps from bed to recliner, PT providing significant tactile cueing for sequencing and direction to  recliner Gait velocity: reduced Gait velocity interpretation: <1.31 ft/sec, indicative of household ambulator    ADL: ADL Overall ADL's : Needs assistance/impaired Eating/Feeding: Minimal assistance, Sitting Grooming: Maximal assistance, Sitting Grooming Details (indicate cue type and reason): Poor following of commands even when presented with ADLs items Upper Body Bathing: Maximal assistance, Sitting Lower Body Bathing: Maximal assistance, Sit to/from stand Upper Body Dressing : Maximal assistance, Sitting Lower Body Dressing: Maximal assistance, Sit to/from stand Lower Body Dressing Details (indicate cue type and reason): Pt with decreased following of commands to don socks. Despite Max verbal cues, tactiles cues, and hand over hand to intiate task.  Toilet Transfer: Moderate assistance, Ambulation(short distance simulated to recliner) Toilet Transfer Details (indicate cue type and reason): Mod A +2 with hand held A to mobilize to recliner. Assistance for balance and power up Functional mobility during ADLs: Moderate assistance, +2 for physical assistance General ADL Comments: Pt with decreased balance, strength, cognition, and actiivty tolerance  Cognition: Cognition Overall Cognitive Status: No family/caregiver present to determine baseline cognitive functioning Arousal/Alertness: Awake/alert Orientation Level: Oriented to person Attention: Focused, Sustained Focused Attention: Appears intact Sustained Attention: Impaired Memory: (unable to assess due to aphasia) Awareness: Impaired Awareness Impairment: Intellectual impairment, Emergent impairment Problem Solving: Impaired Problem Solving Impairment: Verbal basic, Functional basic Cognition Arousal/Alertness: Awake/alert Behavior During Therapy: Flat affect Overall Cognitive Status: No family/caregiver present to determine baseline cognitive functioning General Comments: pt perseverates on having only a little pain in her  head. Then later perseverates on having 2 children when asked. Pt responds ok to many questions. Pt with significant aphasia at this time. Pt unable to follow verbal commands for motor tasks, requires physical initiation into task to then follow through.   Blood pressure (!) 175/83, pulse 90, temperature 99.4 F (37.4 C), temperature source Oral, resp. rate 12, height 5' (1.524 m), weight 83.5 kg, SpO2 98 %. Physical Exam  Nursing note and vitals reviewed. Constitutional: She appears well-developed.  Obese  HENT:  Head with dressing C/D/I Facial edema  Eyes: Right eye exhibits no discharge. Left eye exhibits no discharge. No scleral icterus.  Left ptosis due to lid and infraorbital edema.   Neck: No thyromegaly present.  Respiratory: Effort normal. No stridor. No respiratory distress.  GI: Soft. She exhibits no distension.  Musculoskeletal:     Comments: No edema or tenderness in extremities  Neurological: She is alert.  Not following commands Appears to be perseverative and mumbling Motor: >/3/5 throughout  Skin:  Scalp dressing C/D/I  Psychiatric:  Unable to assess due to mentation    Results for orders placed or performed during the hospital encounter of 08/09/19 (from the past 24 hour(s))  Glucose, capillary     Status: Abnormal   Collection Time: 08/09/19 11:34 AM  Result Value Ref Range   Glucose-Capillary 121 (H) 70 - 99 mg/dL  HIV Antibody (routine testing w rflx)     Status: None   Collection Time: 08/09/19 12:21 PM  Result Value Ref Range   HIV Screen 4th Generation wRfx Non Reactive Non Reactive  Hemoglobin A1c     Status: Abnormal   Collection Time:  08/09/19 12:21 PM  Result Value Ref Range   Hgb A1c MFr Bld 5.7 (H) 4.8 - 5.6 %   Mean Plasma Glucose 116.89 mg/dL  Glucose, capillary     Status: Abnormal   Collection Time: 08/09/19  4:25 PM  Result Value Ref Range   Glucose-Capillary 116 (H) 70 - 99 mg/dL  Glucose, capillary     Status: Abnormal   Collection  Time: 08/09/19  7:25 PM  Result Value Ref Range   Glucose-Capillary 118 (H) 70 - 99 mg/dL  Glucose, capillary     Status: Abnormal   Collection Time: 08/09/19 11:12 PM  Result Value Ref Range   Glucose-Capillary 120 (H) 70 - 99 mg/dL  Glucose, capillary     Status: Abnormal   Collection Time: 08/10/19  3:23 AM  Result Value Ref Range   Glucose-Capillary 113 (H) 70 - 99 mg/dL  Glucose, capillary     Status: None   Collection Time: 08/10/19  7:36 AM  Result Value Ref Range   Glucose-Capillary 97 70 - 99 mg/dL  Glucose, capillary     Status: Abnormal   Collection Time: 08/10/19 11:09 AM  Result Value Ref Range   Glucose-Capillary 106 (H) 70 - 99 mg/dL   MR ANGIO HEAD WO CONTRAST  Result Date: 08/09/2019 CLINICAL DATA:  Postop craniotomy for subdural evacuation. EXAM: MRI HEAD WITHOUT CONTRAST MRA HEAD WITHOUT CONTRAST TECHNIQUE: Multiplanar, multiecho pulse sequences of the brain and surrounding structures were obtained without intravenous contrast. Angiographic images of the head were obtained using MRA technique without contrast. COMPARISON:  CT head 08/09/2019 FINDINGS: MRI HEAD FINDINGS Brain: Interval left hemisphere craniotomy. 4 cm left parietal parenchymal hematoma is unchanged from the prior CT. Interval evacuation of most of the left-sided subdural hematoma. Residual blood in fluid in the left subdural space measuring approximately 4 mm in thickness. No other areas of intracranial hemorrhage. Mass-effect on the left cerebral hemisphere with inferior displacement of the left lateral ventricle. Interval improvement in midline shift now measuring approximately 4 mm. No acute or chronic infarct. Small amount of subarachnoid hemorrhage in the left frontal lobe. Vascular: Normal arterial flow voids Skull and upper cervical spine: Left hemispheric craniotomy. No focal skeletal lesion Sinuses/Orbits: Mucosal edema paranasal sinuses. Negative orbit. Other: None MRA HEAD FINDINGS Motion degraded  study. Internal carotid artery widely patent. Mild irregularity in the cavernous carotid bilaterally due to atherosclerotic disease without significant stenosis. Anterior and middle cerebral arteries patent bilaterally. Mild atherosclerotic disease in the anterior middle cerebral arteries without flow limiting stenosis or large vessel occlusion. Both vertebral arteries patent to the basilar. PICA patent bilaterally. Basilar widely patent. Superior cerebellar and posterior cerebral arteries patent bilaterally. Negative for aneurysm or vascular malformation. IMPRESSION: 1. 4 cm left parietal parenchymal hematoma unchanged 2. Interval craniotomy and drainage of left subdural hematoma with mild amount of residual fluid in sub and blood in the left subdural space. Improvement in mass-effect and midline shift which now measures 4 mm to the right. No other areas of hemorrhage. 3. MRA head demonstrates mild intracranial atherosclerotic disease. No vascular malformation. Motion degraded MRA. Electronically Signed   By: Franchot Gallo M.D.   On: 08/09/2019 16:46   MR BRAIN WO CONTRAST  Result Date: 08/09/2019 CLINICAL DATA:  Postop craniotomy for subdural evacuation. EXAM: MRI HEAD WITHOUT CONTRAST MRA HEAD WITHOUT CONTRAST TECHNIQUE: Multiplanar, multiecho pulse sequences of the brain and surrounding structures were obtained without intravenous contrast. Angiographic images of the head were obtained using MRA technique without contrast.  COMPARISON:  CT head 08/09/2019 FINDINGS: MRI HEAD FINDINGS Brain: Interval left hemisphere craniotomy. 4 cm left parietal parenchymal hematoma is unchanged from the prior CT. Interval evacuation of most of the left-sided subdural hematoma. Residual blood in fluid in the left subdural space measuring approximately 4 mm in thickness. No other areas of intracranial hemorrhage. Mass-effect on the left cerebral hemisphere with inferior displacement of the left lateral ventricle. Interval  improvement in midline shift now measuring approximately 4 mm. No acute or chronic infarct. Small amount of subarachnoid hemorrhage in the left frontal lobe. Vascular: Normal arterial flow voids Skull and upper cervical spine: Left hemispheric craniotomy. No focal skeletal lesion Sinuses/Orbits: Mucosal edema paranasal sinuses. Negative orbit. Other: None MRA HEAD FINDINGS Motion degraded study. Internal carotid artery widely patent. Mild irregularity in the cavernous carotid bilaterally due to atherosclerotic disease without significant stenosis. Anterior and middle cerebral arteries patent bilaterally. Mild atherosclerotic disease in the anterior middle cerebral arteries without flow limiting stenosis or large vessel occlusion. Both vertebral arteries patent to the basilar. PICA patent bilaterally. Basilar widely patent. Superior cerebellar and posterior cerebral arteries patent bilaterally. Negative for aneurysm or vascular malformation. IMPRESSION: 1. 4 cm left parietal parenchymal hematoma unchanged 2. Interval craniotomy and drainage of left subdural hematoma with mild amount of residual fluid in sub and blood in the left subdural space. Improvement in mass-effect and midline shift which now measures 4 mm to the right. No other areas of hemorrhage. 3. MRA head demonstrates mild intracranial atherosclerotic disease. No vascular malformation. Motion degraded MRA. Electronically Signed   By: Franchot Gallo M.D.   On: 08/09/2019 16:46   CT HEAD CODE STROKE WO CONTRAST  Result Date: 08/09/2019 CLINICAL DATA:  Code stroke. Initial evaluation for acute altered mental status. EXAM: CT HEAD WITHOUT CONTRAST TECHNIQUE: Contiguous axial images were obtained from the base of the skull through the vertex without intravenous contrast. COMPARISON:  None. FINDINGS: Brain: Acute intraparenchymal hemorrhage centered at the left parietal lobe measures 3.9 x 3.7 x 3.2 cm (estimated volume 23 cc). Mild surrounding vasogenic  edema. Trace adjacent subarachnoid blood. Associated subdural extension with a left subdural hematoma seen overlying the left cerebral convexity. Subdural collection measures up to 9 mm in maximal thickness. Associated mass effect with left-to-right midline shift measuring up to 11 mm. Left lateral ventricle partially effaced with asymmetric dilatation of the right lateral ventricle, concerning for early trapping. No visible transependymal flow of CSF. Mild basilar cistern crowding without transtentorial herniation. No other acute intracranial hemorrhage. No other acute large vessel territory infarct. No appreciable mass lesion. Vascular: No hyperdense vessel. Scattered vascular calcifications noted within the carotid siphons. Skull: Scalp soft tissues and calvarium within normal limits. Sinuses/Orbits: Globes and orbital soft tissues demonstrate no acute finding. Left gaze noted. Scattered mucosal thickening noted throughout the paranasal sinuses. Left maxillary sinus retention cyst no mastoid effusion. Other: None. IMPRESSION: 1. 3.9 x 3.7 x 3.2 cm (estimated volume 23 cc) acute intraparenchymal hemorrhage centered at the left parietal convexity with mild surrounding vasogenic edema. 2. Associated subdural extension with 9 mm subdural hematoma overlying the left cerebral convexity. Associated mass effect with up to 11 mm left-to-right midline shift. 3. Asymmetric dilatation of the right lateral ventricle, concerning for early ventricular trapping. No visible transependymal flow of CSF. 4. ASPECTS: Does not apply given ICH. These results were communicated to Dr. Leonel Ramsay at 3:11 amon 4/29/2021by text page via the [facility] messaging system. Electronically Signed   By: Jeannine Boga M.D.   On:  08/09/2019 03:13    Assessment/Plan: Diagnosis: Left parietal parenchymal hematoma unchanged Stroke: Continue secondary stroke prophylaxis and Risk Factor Modification listed below:   Blood Pressure Management:   Continue current medication with prn's with permisive HTN per primary team Prediabetes management:   PT/OT for mobility, ADL training  Motor recovery: Fluoxetine Labs independently reviewed.  Records reviewed and summated above.  1. Does the need for close, 24 hr/day medical supervision in concert with the patient's rehab needs make it unreasonable for this patient to be served in a less intensive setting? Yes  2. Co-Morbidities requiring supervision/potential complications: HTN (wean IV meds, monitor and provide prns in accordance with increased physical exertion and pain), anxiety/depression (ensure anxiety and resulting apprehension do not limit functional progress; consider prn medications if warranted), leukocytosis (repeat labs, cont to monitor for signs and symptoms of infection, further workup if indicated) 3. Due to bladder management, bowel management, safety, skin/wound care, disease management, medication administration, pain management and patient education, does the patient require 24 hr/day rehab nursing? Yes 4. Does the patient require coordinated care of a physician, rehab nurse, therapy disciplines of PT/OT/SLP to address physical and functional deficits in the context of the above medical diagnosis(es)? Yes Addressing deficits in the following areas: balance, endurance, locomotion, strength, transferring, bathing, dressing, feeding, toileting, cognition, speech, language and psychosocial support 5. Can the patient actively participate in an intensive therapy program of at least 3 hrs of therapy per day at least 5 days per week? Potentially 6. The potential for patient to make measurable gains while on inpatient rehab is excellent 7. Anticipated functional outcomes upon discharge from inpatient rehab are supervision  with PT, supervision with OT, supervision and min assist with SLP. 8. Estimated rehab length of stay to reach the above functional goals is: 22-25 days. 9. Anticipated  discharge destination: Home 10. Overall Rehab/Functional Prognosis: good  RECOMMENDATIONS: This patient's condition is appropriate for continued rehabilitative care in the following setting: CIR when medically stable able to tolerate 3 as of therapy per day Patient has agreed to participate in recommended program. Potentially Note that insurance prior authorization may be required for reimbursement for recommended care.  Comment: Rehab Admissions Coordinator to follow up.  I have personally performed a face to face diagnostic evaluation, including, but not limited to relevant history and physical exam findings, of this patient and developed relevant assessment and plan.  Additionally, I have reviewed and concur with the physician assistant's documentation above.   Delice Lesch, MD, ABPMR Bary Leriche, PA-C 08/10/2019

## 2019-08-10 NOTE — Progress Notes (Signed)
STROKE TEAM PROGRESS NOTE   INTERVAL HISTORY Her daughter is at the bedside.  Patient is more interactive today but remains with expressive aphasia with nonfluent speech.  She follows simple midline and one-step commands.  Vital signs are stable. Blood pressure well controlled.. She has passed swallow eval MRI shows stable appearance of the parenchymal hematoma with near total evacuation of the left subdural hematoma with postoperative changes Vitals:   08/10/19 1100 08/10/19 1130 08/10/19 1200 08/10/19 1245  BP: (!) 175/83 (!) 154/73 (!) 163/86 139/65  Pulse: 90 77 76 72  Resp: 12 14 18 18   Temp:   99.4 F (37.4 C)   TempSrc:   Oral   SpO2: 98% 94% 98% 95%  Weight:      Height:        CBC:  Recent Labs  Lab 08/09/19 0247 08/09/19 0257  WBC 10.6*  --   NEUTROABS 8.3*  --   HGB 13.6 13.6  HCT 41.1 40.0  MCV 94.7  --   PLT 283  --     Basic Metabolic Panel:  Recent Labs  Lab 08/09/19 0247 08/09/19 0257  NA 139 139  K 3.8 3.8  CL 104 101  CO2 24  --   GLUCOSE 194* 188*  BUN 25* 28*  CREATININE 0.75 0.70  CALCIUM 8.7*  --    Lipid Panel: No results found for: CHOL, TRIG, HDL, CHOLHDL, VLDL, LDLCALC HgbA1c:  Lab Results  Component Value Date   HGBA1C 5.7 (H) 08/09/2019   Urine Drug Screen:     Component Value Date/Time   LABOPIA NONE DETECTED 08/09/2019 1118   COCAINSCRNUR NONE DETECTED 08/09/2019 1118   LABBENZ NONE DETECTED 08/09/2019 1118   AMPHETMU NONE DETECTED 08/09/2019 1118   THCU NONE DETECTED 08/09/2019 1118   LABBARB NONE DETECTED 08/09/2019 1118    Alcohol Level     Component Value Date/Time   ETH <10 08/09/2019 0247    IMAGING past 24 hours MR ANGIO HEAD WO CONTRAST  Result Date: 08/09/2019 CLINICAL DATA:  Postop craniotomy for subdural evacuation. EXAM: MRI HEAD WITHOUT CONTRAST MRA HEAD WITHOUT CONTRAST TECHNIQUE: Multiplanar, multiecho pulse sequences of the brain and surrounding structures were obtained without intravenous contrast.  Angiographic images of the head were obtained using MRA technique without contrast. COMPARISON:  CT head 08/09/2019 FINDINGS: MRI HEAD FINDINGS Brain: Interval left hemisphere craniotomy. 4 cm left parietal parenchymal hematoma is unchanged from the prior CT. Interval evacuation of most of the left-sided subdural hematoma. Residual blood in fluid in the left subdural space measuring approximately 4 mm in thickness. No other areas of intracranial hemorrhage. Mass-effect on the left cerebral hemisphere with inferior displacement of the left lateral ventricle. Interval improvement in midline shift now measuring approximately 4 mm. No acute or chronic infarct. Small amount of subarachnoid hemorrhage in the left frontal lobe. Vascular: Normal arterial flow voids Skull and upper cervical spine: Left hemispheric craniotomy. No focal skeletal lesion Sinuses/Orbits: Mucosal edema paranasal sinuses. Negative orbit. Other: None MRA HEAD FINDINGS Motion degraded study. Internal carotid artery widely patent. Mild irregularity in the cavernous carotid bilaterally due to atherosclerotic disease without significant stenosis. Anterior and middle cerebral arteries patent bilaterally. Mild atherosclerotic disease in the anterior middle cerebral arteries without flow limiting stenosis or large vessel occlusion. Both vertebral arteries patent to the basilar. PICA patent bilaterally. Basilar widely patent. Superior cerebellar and posterior cerebral arteries patent bilaterally. Negative for aneurysm or vascular malformation. IMPRESSION: 1. 4 cm left parietal parenchymal hematoma unchanged 2.  Interval craniotomy and drainage of left subdural hematoma with mild amount of residual fluid in sub and blood in the left subdural space. Improvement in mass-effect and midline shift which now measures 4 mm to the right. No other areas of hemorrhage. 3. MRA head demonstrates mild intracranial atherosclerotic disease. No vascular malformation. Motion  degraded MRA. Electronically Signed   By: Franchot Gallo M.D.   On: 08/09/2019 16:46   MR BRAIN WO CONTRAST  Result Date: 08/09/2019 CLINICAL DATA:  Postop craniotomy for subdural evacuation. EXAM: MRI HEAD WITHOUT CONTRAST MRA HEAD WITHOUT CONTRAST TECHNIQUE: Multiplanar, multiecho pulse sequences of the brain and surrounding structures were obtained without intravenous contrast. Angiographic images of the head were obtained using MRA technique without contrast. COMPARISON:  CT head 08/09/2019 FINDINGS: MRI HEAD FINDINGS Brain: Interval left hemisphere craniotomy. 4 cm left parietal parenchymal hematoma is unchanged from the prior CT. Interval evacuation of most of the left-sided subdural hematoma. Residual blood in fluid in the left subdural space measuring approximately 4 mm in thickness. No other areas of intracranial hemorrhage. Mass-effect on the left cerebral hemisphere with inferior displacement of the left lateral ventricle. Interval improvement in midline shift now measuring approximately 4 mm. No acute or chronic infarct. Small amount of subarachnoid hemorrhage in the left frontal lobe. Vascular: Normal arterial flow voids Skull and upper cervical spine: Left hemispheric craniotomy. No focal skeletal lesion Sinuses/Orbits: Mucosal edema paranasal sinuses. Negative orbit. Other: None MRA HEAD FINDINGS Motion degraded study. Internal carotid artery widely patent. Mild irregularity in the cavernous carotid bilaterally due to atherosclerotic disease without significant stenosis. Anterior and middle cerebral arteries patent bilaterally. Mild atherosclerotic disease in the anterior middle cerebral arteries without flow limiting stenosis or large vessel occlusion. Both vertebral arteries patent to the basilar. PICA patent bilaterally. Basilar widely patent. Superior cerebellar and posterior cerebral arteries patent bilaterally. Negative for aneurysm or vascular malformation. IMPRESSION: 1. 4 cm left parietal  parenchymal hematoma unchanged 2. Interval craniotomy and drainage of left subdural hematoma with mild amount of residual fluid in sub and blood in the left subdural space. Improvement in mass-effect and midline shift which now measures 4 mm to the right. No other areas of hemorrhage. 3. MRA head demonstrates mild intracranial atherosclerotic disease. No vascular malformation. Motion degraded MRA. Electronically Signed   By: Franchot Gallo M.D.   On: 08/09/2019 16:46    PHYSICAL EXAM Michelle Carrillo not in distress. She has surgical dressing on her head from recent subdural hematoma surgery. . Afebrile. Head is nontraumatic. Neck is supple without bruit.    Cardiac exam no murmur or gallop. Lungs are clear to auscultation. Distal pulses are well felt. Neurological Exam :  Patient is mildly sleepy but can be easily aroused. She is slight left gaze preference but can look to the right past midline. She speaks only few words but cannot speak sentences. She follows simple midline and some one-step commands. Extraocular movements appear full range. Blinks to threat bilaterally. Fundi not visualized. Mild right lower facial asymmetry when she smiles. Tongue midline. Able to move all 4 extremities well against gravity but there appears to be mild right hand and leg weakness. No drift. Sensation preserved bilaterally. Gait not tested ASSESSMENT/PLAN Michelle Carrillo is a 69 y.o. female with history of HTN, anxiety, depression. Presenting to ER with AMS, n/v. She was found to have a rather large ICH/SDH which required urgent craniotomy on 4/29.   Non-traumatic ICH and subsequent SDH s/p subdural evacuation surgery- Possible  etiologies include ischemic with hemorrhagic conversion, tumor, hypertensive, avm,    CT: moderate cortically based hematoma with extension into the subdural space resulting in a fairly sizable subdural hematoma on the left.  MRI  Ordered post op  MRA  Ordered  post op.  SCDs for VTE prophylaxis    Diet   Diet Carb Modified Fluid consistency: Thin; Room service appropriate? Yes    None prior to admission, now on none d/t bleeding  Therapy recommendations:  pending  Disposition:  pending  Hypertension  Home meds:  zestril  Stable . Long-term BP goal normotensive  Hyperlipidemia  Home meds: none LDL No results found for requested labs within last 26280 hours.   No hx of Diabetes   HgbA1c No results found for requested labs within last 26280 hours., goal < 7.0  CBGs Recent Labs    08/10/19 0323 08/10/19 0736 08/10/19 1109  GLUCAP 113* 97 106*      Other Stroke Risk Factors  Advanced age  Other Active Problems S/p emergent [facility] day # 1   Recommend mobilize out of bed.  Therapy and rehab consults.  Transfer to neurology stepdown unit when bed available later today.  And strict blood pressure control with systolic goal 1 below 0000000. Mobilize out of bed. Therapy consults. Long discussion with the patient and daughter at the bedside and answered questions about her care. Appreciate neurosurgery help. Greater than 50% time during this 35-minute visit was spent on counseling and coordination of care about her intracerebral hemorrhage and subdural hemorrhage and discussion about rehab and answered questions     Antony Contras, MD Medical Director [place] Pager: [phone] 08/10/2019 2:02 PM  To contact Stroke Continuity provider, please refer to [email or web address] After hours, contact General Neurology

## 2019-08-10 NOTE — Progress Notes (Signed)
Patient ID: Michelle Carrillo, female   DOB: 03/01/1951, 69 y.o.   MRN: [id number] BP (!) 166/79   Pulse 77   Temp 97.9 F (36.6 C) (Oral)   Resp 14   Ht 5' (1.524 m)   Wt 83.5 kg   SpO2 95%   BMI 35.95 kg/m  Alert, aphasic Moving all extremities Wound is clean, dry, no signs of infection Perrl, full eom Will stop iv.

## 2019-08-10 NOTE — Progress Notes (Signed)
Rehab Admissions Coordinator Note:  Patient was screened by Cleatrice Burke for appropriateness for an Inpatient Acute Rehab Consult per therapy recs. .  At this time, we are recommending Inpatient Rehab consult. I will place order per protocol.  Cleatrice Burke RN MSN 08/10/2019, 10:12 AM  I can be reached at [phone].

## 2019-08-11 ENCOUNTER — Inpatient Hospital Stay (HOSPITAL_COMMUNITY): Payer: Medicare Other

## 2019-08-11 DIAGNOSIS — I361 Nonrheumatic tricuspid (valve) insufficiency: Secondary | ICD-10-CM | POA: Diagnosis not present

## 2019-08-11 DIAGNOSIS — I340 Nonrheumatic mitral (valve) insufficiency: Secondary | ICD-10-CM | POA: Diagnosis not present

## 2019-08-11 LAB — LIPID PANEL
Cholesterol: 148 mg/dL (ref 0–200)
HDL: 48 mg/dL (ref >40)
LDL Cholesterol: 83 mg/dL (ref 0–99)
Total CHOL/HDL Ratio: 3.1 RATIO
Triglycerides: 83 mg/dL (ref <150)
VLDL: 17 mg/dL (ref 0–40)

## 2019-08-11 LAB — ECHOCARDIOGRAM COMPLETE
Height: 60 in
Weight: 2945.35 oz

## 2019-08-11 LAB — BASIC METABOLIC PANEL
Anion gap: 9 (ref 5–15)
BUN: 11 mg/dL (ref 8–23)
CO2: 24 mmol/L (ref 22–32)
Calcium: 8.2 mg/dL — ABNORMAL LOW (ref 8.9–10.3)
Chloride: 105 mmol/L (ref 98–111)
Creatinine, Ser: 0.59 mg/dL (ref 0.44–1.00)
GFR calc Af Amer: >60 mL/min (ref >60)
GFR calc non Af Amer: >60 mL/min (ref >60)
Glucose, Bld: 125 mg/dL — ABNORMAL HIGH (ref 70–99)
Potassium: 4 mmol/L (ref 3.5–5.1)
Sodium: 138 mmol/L (ref 135–145)

## 2019-08-11 LAB — CBC
HCT: 29.9 % — ABNORMAL LOW (ref 36.0–46.0)
Hemoglobin: 9.9 g/dL — ABNORMAL LOW (ref 12.0–15.0)
MCH: 30.9 pg (ref 26.0–34.0)
MCHC: 33.1 g/dL (ref 30.0–36.0)
MCV: 93.4 fL (ref 80.0–100.0)
Platelets: 218 10*3/uL (ref 150–400)
RBC: 3.2 MIL/uL — ABNORMAL LOW (ref 3.87–5.11)
RDW: 12.8 % (ref 11.5–15.5)
WBC: 11.4 10*3/uL — ABNORMAL HIGH (ref 4.0–10.5)
nRBC: 0 % (ref 0.0–0.2)

## 2019-08-11 LAB — GLUCOSE, CAPILLARY
Glucose-Capillary: 120 mg/dL — ABNORMAL HIGH (ref 70–99)
Glucose-Capillary: 99 mg/dL (ref 70–99)
Glucose-Capillary: 116 mg/dL — ABNORMAL HIGH (ref 70–99)
Glucose-Capillary: 105 mg/dL — ABNORMAL HIGH (ref 70–99)
Glucose-Capillary: 224 mg/dL — ABNORMAL HIGH (ref 70–99)

## 2019-08-11 LAB — SODIUM
Sodium: 137 mmol/L (ref 135–145)
Sodium: 138 mmol/L (ref 135–145)

## 2019-08-11 MED ORDER — AMLODIPINE BESYLATE 10 MG PO TABS
10.0000 mg | ORAL_TABLET | Freq: Every day | ORAL | Status: DC
  Administered 2019-08-11 – 2019-08-23 (×12): 10 mg via ORAL
  Filled 2019-08-11 (×13): qty 1

## 2019-08-11 MED ORDER — SODIUM CHLORIDE 3 % IV SOLN
INTRAVENOUS | Status: DC
  Administered 2019-08-11: 50 mL/h via INTRAVENOUS
  Administered 2019-08-12 – 2019-08-14 (×8): 75 mL/h via INTRAVENOUS
  Filled 2019-08-11 (×11): qty 500

## 2019-08-11 MED ORDER — LISINOPRIL 20 MG PO TABS
20.0000 mg | ORAL_TABLET | Freq: Two times a day (BID) | ORAL | Status: DC
  Administered 2019-08-11 – 2019-08-16 (×11): 20 mg via ORAL
  Filled 2019-08-11 (×11): qty 1

## 2019-08-11 NOTE — Progress Notes (Signed)
Pt transferred to 4NP; bible and black iPad at bedside.

## 2019-08-11 NOTE — Progress Notes (Signed)
Inpatient Rehab Admissions Coordinator:   Met with patient at bedside to discuss potential CIR admission. Pt. Stated interest. Will pursue for potential admit next week, pending bed availability. Will reach out to patient's daughter to confirm support at discharge.    Clemens Catholic, [place], [place] Admissions Coordinator  [phone] ([place]) [phone] (office)

## 2019-08-11 NOTE — Progress Notes (Signed)
STROKE TEAM PROGRESS NOTE   INTERVAL HISTORY Michelle Carrillo daughter is at the bedside.  Patient is awake, eyes open but global aphasia, not following commands. Only says "yes" to everything. PERRL, right gaze incomplete. Moving all extremities but slight weakness on the right. As per daughter, pt language seems getting worse from the last 2 days. CT showed cerebral edema with worsening MLS. Will start 3% saline.   Vitals:   08/11/19 0200 08/11/19 0218 08/11/19 0528 08/11/19 0730  BP: (!) 181/83 (!) 156/69 (!) 153/72 (!) 153/69  Pulse: 83 76 73 75  Resp: 19 13 17 15   Temp: 98.5 F (36.9 C)  98.7 F (37.1 C) (!) 97.3 F (36.3 C)  TempSrc: Oral  Oral Axillary  SpO2: 95% 96% 97% 96%  Weight:      Height:        CBC:  Recent Labs  Lab 08/09/19 0247 08/09/19 0247 08/09/19 0257 08/11/19 0259  WBC 10.6*  --   --  11.4*  NEUTROABS 8.3*  --   --   --   HGB 13.6   < > 13.6 9.9*  HCT 41.1   < > 40.0 29.9*  MCV 94.7  --   --  93.4  PLT 283  --   --  218   < > = values in this interval not displayed.    Basic Metabolic Panel:  Recent Labs  Lab 08/09/19 0247 08/09/19 0247 08/09/19 0257 08/11/19 0259  NA 139   < > 139 138  K 3.8   < > 3.8 4.0  CL 104   < > 101 105  CO2 24  --   --  24  GLUCOSE 194*   < > 188* 125*  BUN 25*   < > 28* 11  CREATININE 0.75   < > 0.70 0.59  CALCIUM 8.7*  --   --  8.2*   < > = values in this interval not displayed.   Lipid Panel:     Component Value Date/Time   CHOL 148 08/11/2019 0300   TRIG 83 08/11/2019 0300   HDL 48 08/11/2019 0300   CHOLHDL 3.1 08/11/2019 0300   VLDL 17 08/11/2019 0300   LDLCALC 83 08/11/2019 0300   HgbA1c:  Lab Results  Component Value Date   HGBA1C 5.7 (H) 08/09/2019   Urine Drug Screen:     Component Value Date/Time   LABOPIA NONE DETECTED 08/09/2019 1118   COCAINSCRNUR NONE DETECTED 08/09/2019 1118   LABBENZ NONE DETECTED 08/09/2019 1118   AMPHETMU NONE DETECTED 08/09/2019 1118   THCU NONE DETECTED 08/09/2019 1118    LABBARB NONE DETECTED 08/09/2019 1118    Alcohol Level     Component Value Date/Time   ETH <10 08/09/2019 0247    IMAGING past 24 hours  No results found.   MRI / MRA Head 08/09/19 IMPRESSION: 1. 4 cm left parietal parenchymal hematoma unchanged 2. Interval craniotomy and drainage of left subdural hematoma with mild amount of residual fluid in sub and blood in the left subdural space. Improvement in mass-effect and midline shift which now measures 4 mm to the right. No other areas of hemorrhage. 3. MRA head demonstrates mild intracranial atherosclerotic disease. No vascular malformation. Motion degraded MRA.  CT HEAD WO CONTRAST  Result Date: 08/11/2019 CLINICAL DATA:  Intracranial hemorrhage, follow-up EXAM: CT HEAD WITHOUT CONTRAST TECHNIQUE: Contiguous axial images were obtained from the base of the skull through the vertex without intravenous contrast. COMPARISON:  08/09/2018 FINDINGS: Brain: There are  postoperative changes of interval left frontoparietal craniotomy for subdural and parenchymal hematoma evacuation. There is now extra-axial blood, fluid, and blood products along the left cerebral convexity measuring up to 6 mm in thickness (previously 9 mm). Partial evacuation of parenchymal hemorrhage centered in the parietal lobe with air along the operative tract. Residual area of hyperdense hemorrhage measures approximately 3.4 x 2.7 cm (previously 3.9 x 3.7 cm). Surrounding edema is slightly increased Mass effect has improved with persistent rightward midline shift measuring 7 mm (previously 11 mm). There is no evidence of ventricle trapping. Gray-white differentiation is preserved. Vascular: No new finding. Skull: As above.  Otherwise unremarkable. Sinuses/Orbits: No acute finding. Other: None. IMPRESSION: Expected postoperative changes of left subdural and partial parietal parenchymal hematoma evacuation. Improved mass effect with residual subcentimeter midline shift. Electronically  Signed   By: Macy Mis M.D.   On: 08/11/2019 08:52    PHYSICAL EXAM  Temp:  [97.3 F (36.3 C)-99.8 F (37.7 C)] 97.3 F (36.3 C) (05/01 0730) Pulse Rate:  [68-91] 75 (05/01 0730) Resp:  [12-25] 15 (05/01 0730) BP: (106-181)/(53-87) 153/69 (05/01 0730) SpO2:  [92 %-98 %] 96 % (05/01 0730)  General - Well nourished, well developed, in no apparent distress. Intermittently hold onto Michelle Carrillo head, seems to have HA.  Ophthalmologic - fundi not visualized due to noncooperation.  Cardiovascular - Regular rhythm and rate.  Neuro - awake alert eyes open, however, global aphasia, not following commands. Only able to say "yes". Right gaze incomplete, seems to have right hemianopia, not blinking to visual threat on the right, but blinking to the left. PERRL. Right mild facial droop. RUE mild pronator drift. RLE slight weakness than LLE. DTR 1+ and no babinski. Sensation, coordination not cooperative and gait not tested.    ASSESSMENT/PLAN Michelle Carrillo is a 69 y.o. female with history of HTN, anxiety, depression. Presenting to ER with AMS, n/v. She was found to have a rather large left parieta ICH and pan-hemisphere left SDH which required urgent craniotomy on 4/29.   ICH - left parietal large ICH and pan-hemisphere left SDH s/p subdural evacuation - etiology unclear, ? hypertensive   CT - moderate cortically based hematoma with extension into the subdural space resulting in a fairly sizable subdural hematoma on the left.  MRI 08/09/19 - 4 cm left parietal parenchymal hematoma unchanged. Interval craniotomy and drainage of left subdural hematoma with mild amount of residual fluid in sub and blood in the left subdural space. Improvement in mass-effect and midline shift which now measures 4 mm to the right. No other areas of hemorrhage.  MRA 08/09/19 - unremarkable, no AVM or aneurysm  CT head 5/1 - Expected postoperative changes of left subdural and partial parietal parenchymal  hematoma evacuation. Mass effect has improved with persistent rightward midline shift measuring 7 mm  SCDs for VTE prophylaxis  None prior to admission, now on none d/t bleeding  Therapy recommendations:  CIR   Disposition:  pending  Cerebral edema  Aphasia worsened as per daughter  Likely related to worsening cerebral edema  MRI 4/29 - MLS 27mm post procedure  CT 5/1 - MLS 55mm  Put on 3% saline  Na goal 150-155  Na 138   Na Q6h  Hypertension  Home meds:  Zestril  Current meds - Normodyne and Cleviprex prn  Stable at high end  BP goal < 160  Off cleviprex  Put on lisino 20 bid and amlodipine 10 . Long-term BP goal normotensive  Hyperlipidemia  Home meds: none  LDL - 83   May consider statin on discharge  Other Stroke Risk Factors  Advanced age  Other Active Problems  Code Status - Full code  Keppra - seizure prophylaxis  Anemia - Hb - 13.6->9.9 (post op)  Leukocytosis - WBCs - 10.6->11.4 (afebrile)   Hospital day # 2   I spent  35 minutes in total face-to-face time with the patient, more than 50% of which was spent in counseling and coordination of care, reviewing test results, images and medication, and discussing the diagnosis of ICH, SDH, worsening brain edema and ahasia, hypertonic saline treatment, seizure prophylaxis, treatment plan and potential prognosis. This patient's care requiresreview of multiple databases, neurological assessment, discussion with family, other specialists and medical decision making of high complexity. I had long discussion with daughter at bedside, updated pt current condition, treatment plan and potential prognosis, and answered all the questions. She expressed understanding and appreciation.   Rosalin Hawking, MD PhD Stroke Neurology 08/11/2019 11:14 AM   To contact Stroke Continuity provider, please refer to [email or web address] After hours, contact General Neurology

## 2019-08-11 NOTE — Progress Notes (Signed)
Report giving to 4N ICU nurse. Daughter is at bedside and made aware.

## 2019-08-11 NOTE — Anesthesia Postprocedure Evaluation (Signed)
Anesthesia Post Note  Patient: Michelle Carrillo  Procedure(s) Performed: CRANIOTOMY HEMATOMA EVACUATION SUBDURAL (Left Head)     Patient location during evaluation: PACU Anesthesia Type: General Level of consciousness: patient cooperative and awake Pain management: pain level controlled Vital Signs Assessment: post-procedure vital signs reviewed and stable Respiratory status: spontaneous breathing, nonlabored ventilation, respiratory function stable and patient connected to nasal cannula oxygen Cardiovascular status: blood pressure returned to baseline and stable Postop Assessment: no apparent nausea or vomiting Anesthetic complications: no    Last Vitals:  Vitals:   08/11/19 0730 08/11/19 1212  BP: (!) 153/69 (!) 157/69  Pulse: 75 64  Resp: 15 18  Temp: (!) 36.3 C 36.6 C  SpO2: 96% 95%    Last Pain:  Vitals:   08/11/19 1212  TempSrc: Oral  PainSc:                  [name]

## 2019-08-11 NOTE — Progress Notes (Signed)
  Echocardiogram 2D Echocardiogram has been performed.  Michelle Carrillo 08/11/2019, 11:38 AM

## 2019-08-11 NOTE — Progress Notes (Signed)
Neurosurgery Service Progress Note  Subjective: No acute events overnight   Objective: Vitals:   08/11/19 0200 08/11/19 0218 08/11/19 0528 08/11/19 0730  BP: (!) 181/83 (!) 156/69 (!) 153/72 (!) 153/69  Pulse: 83 76 73 75  Resp: 19 13 17 15   Temp: 98.5 F (36.9 C)  98.7 F (37.1 C) (!) 97.3 F (36.3 C)  TempSrc: Oral  Oral Axillary  SpO2: 95% 96% 97% 96%  Weight:      Height:       Temp (24hrs), Avg:98.7 F (37.1 C), Min:97.3 F (36.3 C), Max:99.8 F (37.7 C)  CBC Latest Ref Rng & Units 08/11/2019 08/09/2019 08/09/2019  WBC 4.0 - 10.5 K/uL 11.4(H) - 10.6(H)  Hemoglobin 12.0 - 15.0 g/dL 9.9(L) 13.6 13.6  Hematocrit 36.0 - 46.0 % 29.9(L) 40.0 41.1  Platelets 150 - 400 K/uL 218 - 283   BMP Latest Ref Rng & Units 08/11/2019 08/09/2019 08/09/2019  Glucose 70 - 99 mg/dL 125(H) 188(H) 194(H)  BUN 8 - 23 mg/dL 11 28(H) 25(H)  Creatinine 0.44 - 1.00 mg/dL 0.59 0.70 0.75  BUN/Creat Ratio 12 - 28 - - -  Sodium 135 - 145 mmol/L 138 139 139  Potassium 3.5 - 5.1 mmol/L 4.0 3.8 3.8  Chloride 98 - 111 mmol/L 105 101 104  CO2 22 - 32 mmol/L 24 - 24  Calcium 8.9 - 10.3 mg/dL 8.2(L) - 8.7(L)    Intake/Output Summary (Last 24 hours) at 08/11/2019 1150 Last data filed at 08/10/2019 2300 Gross per 24 hour  Intake 782.34 ml  Output 500 ml  Net 282.34 ml    Current Facility-Administered Medications:  .   stroke: mapping our early stages of recovery book, , Does not apply, Once, Ashok Pall, MD .  0.9 %  sodium chloride infusion, , Intravenous, PRN, Garvin Fila, MD, Stopped at 08/10/19 1441 .  acetaminophen (TYLENOL) tablet 650 mg, 650 mg, Oral, Q4H PRN **OR** acetaminophen (TYLENOL) 160 MG/5ML solution 650 mg, 650 mg, Per Tube, Q4H PRN **OR** acetaminophen (TYLENOL) suppository 650 mg, 650 mg, Rectal, Q4H PRN, Ashok Pall, MD .  amLODipine (NORVASC) tablet 10 mg, 10 mg, Oral, Daily, Rosalin Hawking, MD, 10 mg at 08/11/19 0946 .  Chlorhexidine Gluconate Cloth 2 % PADS 6 each, 6 each, Topical,  Q0600, Greta Doom, MD, 6 each at 08/10/19 1400 .  clevidipine (CLEVIPREX) infusion 0.5 mg/mL, 0-21 mg/hr, Intravenous, Continuous, Ashok Pall, MD, Stopped at 08/09/19 2045 .  HYDROcodone-acetaminophen (NORCO/VICODIN) 5-325 MG per tablet 1 tablet, 1 tablet, Oral, Q4H PRN, Ashok Pall, MD, 1 tablet at 08/11/19 0214 .  insulin aspart (novoLOG) injection 0-15 Units, 0-15 Units, Subcutaneous, Q4H, Ashok Pall, MD, 2 Units at 08/10/19 2005 .  labetalol (NORMODYNE) injection 10-40 mg, 10-40 mg, Intravenous, Q10 min PRN, Ashok Pall, MD, 20 mg at 08/11/19 0201 .  levETIRAcetam (KEPPRA) tablet 500 mg, 500 mg, Oral, BID, Garvin Fila, MD, 500 mg at 08/11/19 0946 .  lisinopril (ZESTRIL) tablet 20 mg, 20 mg, Oral, BID, Rosalin Hawking, MD, 20 mg at 08/11/19 0946 .  morphine 2 MG/ML injection 1-2 mg, 1-2 mg, Intravenous, Q2H PRN, Ashok Pall, MD .  naloxone (NARCAN) injection 0.08 mg, 0.08 mg, Intravenous, PRN, Ashok Pall, MD .  ondansetron (ZOFRAN) tablet 4 mg, 4 mg, Oral, Q4H PRN **OR** ondansetron (ZOFRAN) injection 4 mg, 4 mg, Intravenous, Q4H PRN, Ashok Pall, MD .  pantoprazole (PROTONIX) EC tablet 40 mg, 40 mg, Oral, QHS, Garvin Fila, MD, 40 mg at 08/10/19 2134 .  PARoxetine (PAXIL) tablet 20 mg, 20 mg, Oral, Daily, Garvin Fila, MD, 20 mg at 08/10/19 1718 .  promethazine (PHENERGAN) tablet 12.5-25 mg, 12.5-25 mg, Oral, Q4H PRN, Ashok Pall, MD .  senna-docusate (Senokot-S) tablet 1 tablet, 1 tablet, Oral, BID, Ashok Pall, MD, 1 tablet at 08/11/19 0946 .  sodium chloride (hypertonic) 3 % solution, , Intravenous, Continuous, Rosalin Hawking, MD   Physical Exam: Awake/alert, PERRL, gaze neutral, head wrap in place, primarily spanish-speaking, speech only consists of 'yes' with rare exception, but FC on L in Spanish, RUE/RLE weaker than left but   Assessment & Plan: 69 y.o. woman s/p craniotomy for evacuation of SDH 2/2 ICH.  -no change in neurosurgical plan of care  at this time. CTH reviewed, shows expected increasing edema around the hematoma, increasing MLS, agree w/ starting 3%. She is now POD2 from her hematoma evacuation, should be nearing peak edema period soon, goal of 3% to get her through peak swelling to avoid craniectomy.   Joyice Faster Nesa Distel  08/11/19 11:50 AM

## 2019-08-12 LAB — CBC
HCT: 31.7 % — ABNORMAL LOW (ref 36.0–46.0)
Hemoglobin: 10.2 g/dL — ABNORMAL LOW (ref 12.0–15.0)
MCH: 30.4 pg (ref 26.0–34.0)
MCHC: 32.2 g/dL (ref 30.0–36.0)
MCV: 94.6 fL (ref 80.0–100.0)
Platelets: 243 10*3/uL (ref 150–400)
RBC: 3.35 MIL/uL — ABNORMAL LOW (ref 3.87–5.11)
RDW: 12.9 % (ref 11.5–15.5)
WBC: 10.4 10*3/uL (ref 4.0–10.5)
nRBC: 0 % (ref 0.0–0.2)

## 2019-08-12 LAB — SODIUM
Sodium: 141 mmol/L (ref 135–145)
Sodium: 145 mmol/L (ref 135–145)
Sodium: 144 mmol/L (ref 135–145)
Sodium: 144 mmol/L (ref 135–145)

## 2019-08-12 LAB — BASIC METABOLIC PANEL
Anion gap: 6 (ref 5–15)
BUN: 13 mg/dL (ref 8–23)
CO2: 24 mmol/L (ref 22–32)
Calcium: 8 mg/dL — ABNORMAL LOW (ref 8.9–10.3)
Chloride: 116 mmol/L — ABNORMAL HIGH (ref 98–111)
Creatinine, Ser: 0.52 mg/dL (ref 0.44–1.00)
GFR calc Af Amer: >60 mL/min (ref >60)
GFR calc non Af Amer: >60 mL/min (ref >60)
Glucose, Bld: 98 mg/dL (ref 70–99)
Potassium: 4 mmol/L (ref 3.5–5.1)
Sodium: 146 mmol/L — ABNORMAL HIGH (ref 135–145)

## 2019-08-12 LAB — GLUCOSE, CAPILLARY
Glucose-Capillary: 107 mg/dL — ABNORMAL HIGH (ref 70–99)
Glucose-Capillary: 109 mg/dL — ABNORMAL HIGH (ref 70–99)
Glucose-Capillary: 166 mg/dL — ABNORMAL HIGH (ref 70–99)
Glucose-Capillary: 71 mg/dL (ref 70–99)
Glucose-Capillary: 95 mg/dL (ref 70–99)
Glucose-Capillary: 96 mg/dL (ref 70–99)
Glucose-Capillary: 99 mg/dL (ref 70–99)

## 2019-08-12 NOTE — Progress Notes (Signed)
STROKE TEAM PROGRESS NOTE   INTERVAL HISTORY Daughter and Dr. Zada Finders are at the bedside. Scalp wound dry and clean. Dressing changed. As per daughter, pt language improving. On my exam, pt able to repeat but still has difficulty naming or following commands. However, able to tell her name and able to say good morning in spanish. Moving all extremities.   Vitals:   08/12/19 0400 08/12/19 0500 08/12/19 0600 08/12/19 0700  BP: (!) 120/53 (!) 114/45 (!) 112/49 (!) 111/33  Pulse: 84 67 66 71  Resp: 19 16 17 17   Temp:      TempSrc:      SpO2: 99% 96% 95% 95%  Weight:      Height:        CBC:  Recent Labs  Lab 08/09/19 0247 08/09/19 0257 08/11/19 0259 08/12/19 0610  WBC 10.6*   < > 11.4* 10.4  NEUTROABS 8.3*  --   --   --   HGB 13.6   < > 9.9* 10.2*  HCT 41.1   < > 29.9* 31.7*  MCV 94.7   < > 93.4 94.6  PLT 283   < > 218 243   < > = values in this interval not displayed.    Basic Metabolic Panel:  Recent Labs  Lab 08/11/19 0259 08/11/19 1150 08/11/19 2259 08/12/19 0610  NA 138   < > 141 146*  K 4.0  --   --  4.0  CL 105  --   --  116*  CO2 24  --   --  24  GLUCOSE 125*  --   --  98  BUN 11  --   --  13  CREATININE 0.59  --   --  0.52  CALCIUM 8.2*  --   --  8.0*   < > = values in this interval not displayed.   Lipid Panel:     Component Value Date/Time   CHOL 148 08/11/2019 0300   TRIG 83 08/11/2019 0300   HDL 48 08/11/2019 0300   CHOLHDL 3.1 08/11/2019 0300   VLDL 17 08/11/2019 0300   LDLCALC 83 08/11/2019 0300   HgbA1c:  Lab Results  Component Value Date   HGBA1C 5.7 (H) 08/09/2019   Urine Drug Screen:     Component Value Date/Time   LABOPIA NONE DETECTED 08/09/2019 1118   COCAINSCRNUR NONE DETECTED 08/09/2019 1118   LABBENZ NONE DETECTED 08/09/2019 1118   AMPHETMU NONE DETECTED 08/09/2019 1118   THCU NONE DETECTED 08/09/2019 1118   LABBARB NONE DETECTED 08/09/2019 1118    Alcohol Level     Component Value Date/Time   ETH <10 08/09/2019 0247     IMAGING past 24 hours  CT HEAD WO CONTRAST  Result Date: 08/11/2019 CLINICAL DATA:  Intracranial hemorrhage, follow-up EXAM: CT HEAD WITHOUT CONTRAST TECHNIQUE: Contiguous axial images were obtained from the base of the skull through the vertex without intravenous contrast. COMPARISON:  08/09/2018 FINDINGS: Brain: There are postoperative changes of interval left frontoparietal craniotomy for subdural and parenchymal hematoma evacuation. There is now extra-axial blood, fluid, and blood products along the left cerebral convexity measuring up to 6 mm in thickness (previously 9 mm). Partial evacuation of parenchymal hemorrhage centered in the parietal lobe with air along the operative tract. Residual area of hyperdense hemorrhage measures approximately 3.4 x 2.7 cm (previously 3.9 x 3.7 cm). Surrounding edema is slightly increased Mass effect has improved with persistent rightward midline shift measuring 7 mm (previously 11 mm). There is no  evidence of ventricle trapping. Gray-white differentiation is preserved. Vascular: No new finding. Skull: As above.  Otherwise unremarkable. Sinuses/Orbits: No acute finding. Other: None. IMPRESSION: Expected postoperative changes of left subdural and partial parietal parenchymal hematoma evacuation. Improved mass effect with residual subcentimeter midline shift. Electronically Signed   By: Macy Mis M.D.   On: 08/11/2019 08:52   ECHOCARDIOGRAM COMPLETE  Result Date: 08/11/2019    ECHOCARDIOGRAM REPORT   Patient Name:   Michelle Carrillo Date of Exam: 08/11/2019 Medical Rec #:  [id number]              Height:       60.0 in Accession #:    [id number]             Weight:       184.1 lb Date of Birth:  1951/01/06              BSA:          1.802 m Patient Age:    69 years               BP:           153/69 mmHg Patient Gender: F                      HR:           75 bpm. Exam Location:  Inpatient Procedure: 2D Echo, Cardiac Doppler and Color Doppler Indications:     Stroke 434.91/I163.9  History:        Patient has no prior history of Echocardiogram examinations.                 Risk Factors:Hypertension.  Sonographer:    Clayton Lefort RDCS (AE) Referring Phys: [id number] [facility]  Sonographer Comments: Suboptimal subcostal window. IMPRESSIONS  1. Normal LV systolic function; grade 1 diastolic dysfunction; mild MR; mild to moderate TR.  2. Left ventricular ejection fraction, by estimation, is 60 to 65%. The left ventricle has normal function. The left ventricle has no regional wall motion abnormalities. Left ventricular diastolic parameters are consistent with Grade I diastolic dysfunction (impaired relaxation).  3. Right ventricular systolic function is normal. The right ventricular size is normal.  4. The mitral valve is normal in structure. Mild mitral valve regurgitation. No evidence of mitral stenosis.  5. Tricuspid valve regurgitation is mild to moderate.  6. The aortic valve is tricuspid. Aortic valve regurgitation is not visualized. No aortic stenosis is present.  7. The inferior vena cava is normal in size with greater than 50% respiratory variability, suggesting right atrial pressure of 3 mmHg. FINDINGS  Left Ventricle: Left ventricular ejection fraction, by estimation, is 60 to 65%. The left ventricle has normal function. The left ventricle has no regional wall motion abnormalities. The left ventricular internal cavity size was normal in size. There is  no left ventricular hypertrophy. Left ventricular diastolic parameters are consistent with Grade I diastolic dysfunction (impaired relaxation). Right Ventricle: The right ventricular size is normal.Right ventricular systolic function is normal. Left Atrium: Left atrial size was normal in size. Right Atrium: Right atrial size was normal in size. Pericardium: There is no evidence of pericardial effusion. Mitral Valve: The mitral valve is normal in structure. Normal mobility of the mitral valve leaflets. Mild mitral valve  regurgitation. No evidence of mitral valve stenosis. MV peak gradient, 7.8 mmHg. The mean mitral valve gradient is 3.0 mmHg. Tricuspid Valve: The tricuspid valve is normal in structure.  Tricuspid valve regurgitation is mild to moderate. No evidence of tricuspid stenosis. Aortic Valve: The aortic valve is tricuspid. Aortic valve regurgitation is not visualized. No aortic stenosis is present. Aortic valve mean gradient measures 5.0 mmHg. Aortic valve peak gradient measures 8.5 mmHg. Aortic valve area, by VTI measures 1.77 cm. Pulmonic Valve: The pulmonic valve was normal in structure. Pulmonic valve regurgitation is not visualized. No evidence of pulmonic stenosis. Aorta: The aortic root is normal in size and structure. Venous: The inferior vena cava is normal in size with greater than 50% respiratory variability, suggesting right atrial pressure of 3 mmHg. IAS/Shunts: No atrial level shunt detected by color flow Doppler. Additional Comments: Normal LV systolic function; grade 1 diastolic dysfunction; mild MR; mild to moderate TR.  LEFT VENTRICLE PLAX 2D LVIDd:         4.40 cm  Diastology LVIDs:         3.00 cm  LV e' lateral:   9.90 cm/s LV PW:         1.00 cm  LV E/e' lateral: 11.4 LV IVS:        1.00 cm  LV e' medial:    6.53 cm/s LVOT diam:     1.90 cm  LV E/e' medial:  17.3 LV SV:         59 LV SV Index:   33 LVOT Area:     2.84 cm  RIGHT VENTRICLE             IVC RV Basal diam:  2.70 cm     IVC diam: 1.90 cm RV S prime:     13.80 cm/s TAPSE (M-mode): 2.6 cm LEFT ATRIUM             Index       RIGHT ATRIUM           Index LA diam:        3.40 cm 1.89 cm/m  RA Area:     12.10 cm LA Vol (A2C):   64.3 ml 35.68 ml/m RA Volume:   24.30 ml  13.49 ml/m LA Vol (A4C):   38.2 ml 21.20 ml/m LA Biplane Vol: 52.3 ml 29.02 ml/m  AORTIC VALVE AV Area (Vmax):    1.94 cm AV Area (Vmean):   1.65 cm AV Area (VTI):     1.77 cm AV Vmax:           146.00 cm/s AV Vmean:          107.000 cm/s AV VTI:            0.334 m AV  Peak Grad:      8.5 mmHg AV Mean Grad:      5.0 mmHg LVOT Vmax:         99.90 cm/s LVOT Vmean:        62.100 cm/s LVOT VTI:          0.209 m LVOT/AV VTI ratio: 0.63  AORTA Ao Root diam: 3.00 cm Ao Asc diam:  3.10 cm MITRAL VALVE                TRICUSPID VALVE MV Area (PHT): 3.27 cm     TR Peak grad:   43.0 mmHg MV Peak grad:  7.8 mmHg     TR Vmax:        328.00 cm/s MV Mean grad:  3.0 mmHg MV Vmax:       1.40 m/s     SHUNTS MV Vmean:  80.0 cm/s    Systemic VTI:  0.21 m MV Decel Time: 232 msec     Systemic Diam: 1.90 cm MV E velocity: 113.00 cm/s MV A velocity: 144.00 cm/s MV E/A ratio:  0.78 Kirk Ruths MD Electronically signed by Kirk Ruths MD Signature Date/Time: 08/11/2019/1:03:37 PM    Final      MRI / MRA Head 08/09/19 IMPRESSION: 1. 4 cm left parietal parenchymal hematoma unchanged 2. Interval craniotomy and drainage of left subdural hematoma with mild amount of residual fluid in sub and blood in the left subdural space. Improvement in mass-effect and midline shift which now measures 4 mm to the right. No other areas of hemorrhage. 3. MRA head demonstrates mild intracranial atherosclerotic disease. No vascular malformation. Motion degraded MRA.  CT HEAD WO CONTRAST  Result Date: 08/11/2019 CLINICAL DATA:  Intracranial hemorrhage, follow-up EXAM: CT HEAD WITHOUT CONTRAST TECHNIQUE: Contiguous axial images were obtained from the base of the skull through the vertex without intravenous contrast. COMPARISON:  08/09/2018 FINDINGS: Brain: There are postoperative changes of interval left frontoparietal craniotomy for subdural and parenchymal hematoma evacuation. There is now extra-axial blood, fluid, and blood products along the left cerebral convexity measuring up to 6 mm in thickness (previously 9 mm). Partial evacuation of parenchymal hemorrhage centered in the parietal lobe with air along the operative tract. Residual area of hyperdense hemorrhage measures approximately 3.4 x 2.7 cm  (previously 3.9 x 3.7 cm). Surrounding edema is slightly increased Mass effect has improved with persistent rightward midline shift measuring 7 mm (previously 11 mm). There is no evidence of ventricle trapping. Gray-white differentiation is preserved. Vascular: No new finding. Skull: As above.  Otherwise unremarkable. Sinuses/Orbits: No acute finding. Other: None. IMPRESSION: Expected postoperative changes of left subdural and partial parietal parenchymal hematoma evacuation. Improved mass effect with residual subcentimeter midline shift. Electronically Signed   By: Macy Mis M.D.   On: 08/11/2019 08:52   ECHOCARDIOGRAM COMPLETE  Result Date: 08/11/2019    ECHOCARDIOGRAM REPORT   Patient Name:   Michelle Carrillo Date of Exam: 08/11/2019 Medical Rec #:  [id number]              Height:       60.0 in Accession #:    [id number]             Weight:       184.1 lb Date of Birth:  Jun 19, 1950              BSA:          1.802 m Patient Age:    75 years               BP:           153/69 mmHg Patient Gender: F                      HR:           75 bpm. Exam Location:  Inpatient Procedure: 2D Echo, Cardiac Doppler and Color Doppler Indications:    Stroke 434.91/I163.9  History:        Patient has no prior history of Echocardiogram examinations.                 Risk Factors:Hypertension.  Sonographer:    Clayton Lefort RDCS (AE) Referring Phys: [id number] [facility]  Sonographer Comments: Suboptimal subcostal window. IMPRESSIONS  1. Normal LV systolic function; grade 1 diastolic dysfunction; mild MR; mild to moderate TR.  2. Left ventricular ejection fraction, by estimation, is 60 to 65%. The left ventricle has normal function. The left ventricle has no regional wall motion abnormalities. Left ventricular diastolic parameters are consistent with Grade I diastolic dysfunction (impaired relaxation).  3. Right ventricular systolic function is normal. The right ventricular size is normal.  4. The mitral valve is normal in  structure. Mild mitral valve regurgitation. No evidence of mitral stenosis.  5. Tricuspid valve regurgitation is mild to moderate.  6. The aortic valve is tricuspid. Aortic valve regurgitation is not visualized. No aortic stenosis is present.  7. The inferior vena cava is normal in size with greater than 50% respiratory variability, suggesting right atrial pressure of 3 mmHg. FINDINGS  Left Ventricle: Left ventricular ejection fraction, by estimation, is 60 to 65%. The left ventricle has normal function. The left ventricle has no regional wall motion abnormalities. The left ventricular internal cavity size was normal in size. There is  no left ventricular hypertrophy. Left ventricular diastolic parameters are consistent with Grade I diastolic dysfunction (impaired relaxation). Right Ventricle: The right ventricular size is normal.Right ventricular systolic function is normal. Left Atrium: Left atrial size was normal in size. Right Atrium: Right atrial size was normal in size. Pericardium: There is no evidence of pericardial effusion. Mitral Valve: The mitral valve is normal in structure. Normal mobility of the mitral valve leaflets. Mild mitral valve regurgitation. No evidence of mitral valve stenosis. MV peak gradient, 7.8 mmHg. The mean mitral valve gradient is 3.0 mmHg. Tricuspid Valve: The tricuspid valve is normal in structure. Tricuspid valve regurgitation is mild to moderate. No evidence of tricuspid stenosis. Aortic Valve: The aortic valve is tricuspid. Aortic valve regurgitation is not visualized. No aortic stenosis is present. Aortic valve mean gradient measures 5.0 mmHg. Aortic valve peak gradient measures 8.5 mmHg. Aortic valve area, by VTI measures 1.77 cm. Pulmonic Valve: The pulmonic valve was normal in structure. Pulmonic valve regurgitation is not visualized. No evidence of pulmonic stenosis. Aorta: The aortic root is normal in size and structure. Venous: The inferior vena cava is normal in size  with greater than 50% respiratory variability, suggesting right atrial pressure of 3 mmHg. IAS/Shunts: No atrial level shunt detected by color flow Doppler. Additional Comments: Normal LV systolic function; grade 1 diastolic dysfunction; mild MR; mild to moderate TR.  LEFT VENTRICLE PLAX 2D LVIDd:         4.40 cm  Diastology LVIDs:         3.00 cm  LV e' lateral:   9.90 cm/s LV PW:         1.00 cm  LV E/e' lateral: 11.4 LV IVS:        1.00 cm  LV e' medial:    6.53 cm/s LVOT diam:     1.90 cm  LV E/e' medial:  17.3 LV SV:         59 LV SV Index:   33 LVOT Area:     2.84 cm  RIGHT VENTRICLE             IVC RV Basal diam:  2.70 cm     IVC diam: 1.90 cm RV S prime:     13.80 cm/s TAPSE (M-mode): 2.6 cm LEFT ATRIUM             Index       RIGHT ATRIUM           Index LA diam:        3.40 cm  1.89 cm/m  RA Area:     12.10 cm LA Vol (A2C):   64.3 ml 35.68 ml/m RA Volume:   24.30 ml  13.49 ml/m LA Vol (A4C):   38.2 ml 21.20 ml/m LA Biplane Vol: 52.3 ml 29.02 ml/m  AORTIC VALVE AV Area (Vmax):    1.94 cm AV Area (Vmean):   1.65 cm AV Area (VTI):     1.77 cm AV Vmax:           146.00 cm/s AV Vmean:          107.000 cm/s AV VTI:            0.334 m AV Peak Grad:      8.5 mmHg AV Mean Grad:      5.0 mmHg LVOT Vmax:         99.90 cm/s LVOT Vmean:        62.100 cm/s LVOT VTI:          0.209 m LVOT/AV VTI ratio: 0.63  AORTA Ao Root diam: 3.00 cm Ao Asc diam:  3.10 cm MITRAL VALVE                TRICUSPID VALVE MV Area (PHT): 3.27 cm     TR Peak grad:   43.0 mmHg MV Peak grad:  7.8 mmHg     TR Vmax:        328.00 cm/s MV Mean grad:  3.0 mmHg MV Vmax:       1.40 m/s     SHUNTS MV Vmean:      80.0 cm/s    Systemic VTI:  0.21 m MV Decel Time: 232 msec     Systemic Diam: 1.90 cm MV E velocity: 113.00 cm/s MV A velocity: 144.00 cm/s MV E/A ratio:  0.78 Kirk Ruths MD Electronically signed by Kirk Ruths MD Signature Date/Time: 08/11/2019/1:03:37 PM    Final     PHYSICAL EXAM  Temp:  [97.8 F (36.6 C)-99.1 F (37.3  C)] 99.1 F (37.3 C) (05/01 1600) Pulse Rate:  [62-84] 71 (05/02 0700) Resp:  [13-21] 17 (05/02 0700) BP: (89-166)/(33-109) 111/33 (05/02 0700) SpO2:  [93 %-99 %] 95 % (05/02 0700)  General - Well nourished, well developed, in no apparent distress. Scalp dressing dry clean  Ophthalmologic - fundi not visualized due to noncooperation.  Cardiovascular - Regular rhythm and rate.  Neuro - awake alert eyes open, able to tell name but still not following commands. However, able to repeat simple sentences but not able to name yet. Right gaze incomplete, question for right hemianopia, not consistently blinking to visual threat on the right, but blinking to the left. PERRL. Right mild facial droop. BUE and BLE equal strength today. DTR 1+ and no babinski. Sensation, coordination not cooperative and gait not tested.   ASSESSMENT/PLAN Michelle Carrillo is a 69 y.o. female with history of HTN, anxiety, depression. Presenting to ER with AMS, n/v. She was found to have a rather large left parieta ICH and pan-hemisphere left SDH which required urgent craniotomy on 4/29.   ICH - left parietal large ICH and pan-hemisphere left SDH s/p subdural evacuation - etiology unclear, ? hypertensive   CT - moderate cortically based hematoma with extension into the subdural space resulting in a fairly sizable subdural hematoma on the left.  MRI 08/09/19 - 4 cm left parietal parenchymal hematoma unchanged. Interval craniotomy and drainage of left subdural hematoma with mild amount of residual fluid in sub and blood in the  left subdural space. Improvement in mass-effect and midline shift which now measures 4 mm to the right. No other areas of hemorrhage.  MRA 08/09/19 - unremarkable, no AVM or aneurysm  CT head 5/1 - Expected postoperative changes of left subdural and partial parietal parenchymal hematoma evacuation. Mass effect has improved with persistent rightward midline shift measuring 7 mm  CT head  5/3 - pending  EF 60 - 65%. No cardiac source of emboli identified.   SCDs for VTE prophylaxis  None prior to admission, now on none d/t bleeding  Therapy recommendations:  CIR   Disposition:  pending  Cerebral edema  Aphasia worsened as per daughter  Likely related to worsening cerebral edema  MRI 4/29 - MLS 65mm post procedure  CT 5/1 - MLS 78mm  CT 5/3 - pending  Put on 3% saline - 08/11/19 - 75 cc / hr  Na goal 150-155  Na 138->146 ->144  Na Q6h  Hypertension  Home meds:  Zestril  Current meds - Normodyne and Cleviprex prn  Stable at high end  BP goal < 160  Off cleviprex  Put on lisinopril 20 bid and amlodipine 10 . Long-term BP goal normotensive  Hyperlipidemia  Home meds: none  LDL - 83   May consider statin on discharge  Other Stroke Risk Factors  Advanced age  Other Active Problems  Code Status - Full code  Keppra - seizure prophylaxis  Anemia - Hb - 13.6->9.9->10.4 (post op)  Leukocytosis - WBCs - 10.6->11.4->10.2    Hospital day # 3   I spent 42minutes in total face-to-face time with the patient, more than 50% of which was spent in counseling and coordination of care, reviewing test results, images and medication, and discussing the diagnosis of ICH, SDH, worsening brain edema and ahasia, hypertonic saline treatment, seizure prophylaxis, treatment plan and potential prognosis. This patient's care requiresreview of multiple databases, neurological assessment, discussion with family, other specialists and medical decision making of high complexity. I had long discussion with daughter at bedside, updated pt current condition, treatment plan and potential prognosis, and answered all the questions. She expressed understanding and appreciation.   Rosalin Hawking, MD PhD Stroke Neurology 08/12/2019 7:09 PM  To contact Stroke Continuity provider, please refer to [email or web address] After hours, contact General Neurology

## 2019-08-12 NOTE — Progress Notes (Signed)
Neurosurgery Service Progress Note  Subjective: No acute events overnight, transferred to unit for starting hypertonics  Objective: Vitals:   08/12/19 0700 08/12/19 0800 08/12/19 0830 08/12/19 0900  BP: (!) 111/33 (!) 167/79 (!) 163/74 (!) 165/72  Pulse: 71 81 73 77  Resp: 17 15 15 14   Temp:  98.3 F (36.8 C)    TempSrc:  Axillary    SpO2: 95% 97% 94% 96%  Weight:      Height:       Temp (24hrs), Avg:98.4 F (36.9 C), Min:97.8 F (36.6 C), Max:99.1 F (37.3 C)  CBC Latest Ref Rng & Units 08/12/2019 08/11/2019 08/09/2019  WBC 4.0 - 10.5 K/uL 10.4 11.4(H) -  Hemoglobin 12.0 - 15.0 g/dL 10.2(L) 9.9(L) 13.6  Hematocrit 36.0 - 46.0 % 31.7(L) 29.9(L) 40.0  Platelets 150 - 400 K/uL 243 218 -   BMP Latest Ref Rng & Units 08/12/2019 08/11/2019 08/11/2019  Glucose 70 - 99 mg/dL 98 - -  BUN 8 - 23 mg/dL 13 - -  Creatinine 0.44 - 1.00 mg/dL 0.52 - -  BUN/Creat Ratio 12 - 28 - - -  Sodium 135 - 145 mmol/L 146(H) 141 138  Potassium 3.5 - 5.1 mmol/L 4.0 - -  Chloride 98 - 111 mmol/L 116(H) - -  CO2 22 - 32 mmol/L 24 - -  Calcium 8.9 - 10.3 mg/dL 8.0(L) - -    Intake/Output Summary (Last 24 hours) at 08/12/2019 0950 Last data filed at 08/12/2019 0900 Gross per 24 hour  Intake 1307.99 ml  Output 1975 ml  Net -667.01 ml    Current Facility-Administered Medications:  .   stroke: mapping our early stages of recovery book, , Does not apply, Once, Ashok Pall, MD .  0.9 %  sodium chloride infusion, , Intravenous, PRN, Garvin Fila, MD, Stopped at 08/10/19 1441 .  acetaminophen (TYLENOL) tablet 650 mg, 650 mg, Oral, Q4H PRN **OR** acetaminophen (TYLENOL) 160 MG/5ML solution 650 mg, 650 mg, Per Tube, Q4H PRN **OR** acetaminophen (TYLENOL) suppository 650 mg, 650 mg, Rectal, Q4H PRN, Ashok Pall, MD .  amLODipine (NORVASC) tablet 10 mg, 10 mg, Oral, Daily, Rosalin Hawking, MD, 10 mg at 08/11/19 0946 .  Chlorhexidine Gluconate Cloth 2 % PADS 6 each, 6 each, Topical, Q0600, Greta Doom, MD,  6 each at 08/10/19 1400 .  HYDROcodone-acetaminophen (NORCO/VICODIN) 5-325 MG per tablet 1 tablet, 1 tablet, Oral, Q4H PRN, Ashok Pall, MD, 1 tablet at 08/11/19 0214 .  insulin aspart (novoLOG) injection 0-15 Units, 0-15 Units, Subcutaneous, Q4H, Cabbell, Kyle, MD, 5 Units at 08/11/19 2118 .  labetalol (NORMODYNE) injection 10-40 mg, 10-40 mg, Intravenous, Q10 min PRN, Ashok Pall, MD, 10 mg at 08/12/19 0841 .  levETIRAcetam (KEPPRA) tablet 500 mg, 500 mg, Oral, BID, Garvin Fila, MD, 500 mg at 08/11/19 2112 .  lisinopril (ZESTRIL) tablet 20 mg, 20 mg, Oral, BID, Rosalin Hawking, MD, 20 mg at 08/11/19 2112 .  morphine 2 MG/ML injection 1-2 mg, 1-2 mg, Intravenous, Q2H PRN, Ashok Pall, MD .  naloxone (NARCAN) injection 0.08 mg, 0.08 mg, Intravenous, PRN, Ashok Pall, MD .  ondansetron (ZOFRAN) tablet 4 mg, 4 mg, Oral, Q4H PRN **OR** ondansetron (ZOFRAN) injection 4 mg, 4 mg, Intravenous, Q4H PRN, Ashok Pall, MD .  pantoprazole (PROTONIX) EC tablet 40 mg, 40 mg, Oral, QHS, Garvin Fila, MD, 40 mg at 08/11/19 2112 .  PARoxetine (PAXIL) tablet 20 mg, 20 mg, Oral, Daily, Garvin Fila, MD, 20 mg at 08/11/19 1158 .  promethazine (PHENERGAN) tablet 12.5-25 mg, 12.5-25 mg, Oral, Q4H PRN, Ashok Pall, MD .  senna-docusate (Senokot-S) tablet 1 tablet, 1 tablet, Oral, BID, Ashok Pall, MD, 1 tablet at 08/11/19 2112 .  sodium chloride (hypertonic) 3 % solution, , Intravenous, Continuous, Aroor, Lanice Schwab, MD, Last Rate: 75 mL/hr at 08/12/19 0900, Rate Verify at 08/12/19 0900   Physical Exam: Awake/alert, PERRL, gaze neutral, head wrap in place, primarily spanish-speaking, speech / interaction improved today with expanded vocabulary and more alert, FC on L in Spanish, RUE/RLE weaker than left   Assessment & Plan: 69 y.o. woman s/p craniotomy for evacuation of SDH 2/2 ICH.  -exam improving w/ hypertonics, Na incremented well, no change in neurosurgical plan of care   Judith Part  08/12/19 9:50 AM

## 2019-08-13 ENCOUNTER — Inpatient Hospital Stay (HOSPITAL_COMMUNITY): Payer: Medicare Other

## 2019-08-13 DIAGNOSIS — Z9889 Other specified postprocedural states: Secondary | ICD-10-CM

## 2019-08-13 LAB — GLUCOSE, CAPILLARY
Glucose-Capillary: 97 mg/dL (ref 70–99)
Glucose-Capillary: 104 mg/dL — ABNORMAL HIGH (ref 70–99)
Glucose-Capillary: 125 mg/dL — ABNORMAL HIGH (ref 70–99)
Glucose-Capillary: 147 mg/dL — ABNORMAL HIGH (ref 70–99)
Glucose-Capillary: 107 mg/dL — ABNORMAL HIGH (ref 70–99)

## 2019-08-13 LAB — SODIUM
Sodium: 145 mmol/L (ref 135–145)
Sodium: 146 mmol/L — ABNORMAL HIGH (ref 135–145)
Sodium: 145 mmol/L (ref 135–145)

## 2019-08-13 LAB — BASIC METABOLIC PANEL
Anion gap: 7 (ref 5–15)
BUN: 11 mg/dL (ref 8–23)
CO2: 27 mmol/L (ref 22–32)
Calcium: 8.2 mg/dL — ABNORMAL LOW (ref 8.9–10.3)
Chloride: 115 mmol/L — ABNORMAL HIGH (ref 98–111)
Creatinine, Ser: 0.5 mg/dL (ref 0.44–1.00)
GFR calc Af Amer: >60 mL/min (ref >60)
GFR calc non Af Amer: >60 mL/min (ref >60)
Glucose, Bld: 115 mg/dL — ABNORMAL HIGH (ref 70–99)
Potassium: 3.5 mmol/L (ref 3.5–5.1)
Sodium: 149 mmol/L — ABNORMAL HIGH (ref 135–145)

## 2019-08-13 LAB — CBC
HCT: 31 % — ABNORMAL LOW (ref 36.0–46.0)
Hemoglobin: 10.1 g/dL — ABNORMAL LOW (ref 12.0–15.0)
MCH: 30.9 pg (ref 26.0–34.0)
MCHC: 32.6 g/dL (ref 30.0–36.0)
MCV: 94.8 fL (ref 80.0–100.0)
Platelets: 272 10*3/uL (ref 150–400)
RBC: 3.27 MIL/uL — ABNORMAL LOW (ref 3.87–5.11)
RDW: 12.9 % (ref 11.5–15.5)
WBC: 8.5 10*3/uL (ref 4.0–10.5)
nRBC: 0.6 % — ABNORMAL HIGH (ref 0.0–0.2)

## 2019-08-13 MED ORDER — INSULIN ASPART 100 UNIT/ML ~~LOC~~ SOLN
0.0000 [IU] | Freq: Three times a day (TID) | SUBCUTANEOUS | Status: DC
  Administered 2019-08-13 – 2019-08-16 (×6): 1 [IU] via SUBCUTANEOUS

## 2019-08-13 NOTE — Progress Notes (Signed)
Patient ID: Michelle Carrillo, female   DOB: 1951/03/09, 69 y.o.   MRN: [id number] BP (!) 154/73   Pulse 70   Temp 98.7 F (37.1 C) (Oral)   Resp 14   Ht 5' (1.524 m)   Wt 83.5 kg   SpO2 96%   BMI 35.95 kg/m  BP (!) 154/73   Pulse 70   Temp 98.7 F (37.1 C) (Oral)   Resp 14   Ht 5' (1.524 m)   Wt 83.5 kg   SpO2 96%   BMI 35.95 kg/m  Alert, oriented to person, her daughter, not to place, nor situation Moving all extremities Wound is clean,dry, no signs of infection Stable exam Head CT shows persistent shift.

## 2019-08-13 NOTE — Progress Notes (Signed)
Occupational Therapy Treatment Patient Details Name: Michelle Carrillo MRN: [id number] DOB: 05/11/1950 Today's Date: 08/13/2019    History of present illness 69 y.o. female the history of hypertension who presents with altered mental status that started abruptly this evening. Pt's daughter noticed the pt had vomited and that her cognition was altered. Pt found to have acute SDH and L parietal occipital ICH. Pt underwent Left fronto-temporal- parietal    OT comments  Daughter present for session and translated in addition to use of ipad interpreter. Pt making steady progress toward goals. RUE with generalized weakness, dropping items frequently with R hand. Appears to demonstrate difficulty with motor planning in addition to R inattention and R visual field deficits. Appears more verbal this session. Systolic BP elevated above 160 after transferring from [facility] to recliner - nsg gave meds. Continue to recommend CIR for intensive rehab to maximize functional level to independence to facilitate safe DC home with family.   Follow Up Recommendations  Supervision/Assistance - 24 hour;CIR    Equipment Recommendations  3 in 1 bedside commode    Recommendations for Other Services Rehab consult    Precautions / Restrictions Precautions Precautions: Fall Precaution Comments: R sided neglect and possible field cut Restrictions Weight Bearing Restrictions: No       Mobility Bed Mobility Overal bed mobility: Needs Assistance Bed Mobility: Supine to Sit     Supine to sit: Mod assist;+2 for physical assistance     General bed mobility comments: pt requiring max verbal and tactile cues to initiate transfer to EOB, HOB elevated  Transfers Overall transfer level: Needs assistance Equipment used: 2 person hand held assist Transfers: Sit to/from [facility] Sit to Stand: Mod assist;+2 physical assistance Stand pivot transfers: Mod assist;+2 physical assistance       General  transfer comment: pt slow and guarded but steady, no knee buckling, BP did increase above 0000000 systolic limiting ambulation today    Balance Overall balance assessment: Needs assistance Sitting-balance support: No upper extremity supported;Feet supported Sitting balance-Leahy Scale: Fair Sitting balance - Comments: pt able to maintain EOB balance with close contact guard   Standing balance support: Bilateral upper extremity supported Standing balance-Leahy Scale: Poor Standing balance comment: minA x2 for static standing balance                           ADL either performed or assessed with clinical judgement   ADL Overall ADL's : Needs assistance/impaired Eating/Feeding: Set up;Supervision/ safety;Sitting   Grooming: Sitting;Cueing for sequencing;Moderate assistance   Upper Body Bathing: Minimal assistance;Sitting   Lower Body Bathing: Moderate assistance;Sit to/from stand   Upper Body Dressing : Moderate assistance;Sitting Upper Body Dressing Details (indicate cue type and reason): difficulty locating R armhole; unaware she was trying to put her arm through tele pocket and unablet o process her mistake Lower Body Dressing: Maximal assistance   Toilet Transfer: Moderate assistance;+2 for physical assistance;Stand-pivot;BSC   Toileting- Clothing Manipulation and Hygiene: Maximal assistance       Functional mobility during ADLs: Moderate assistance;+2 for physical assistance General ADL Comments: Attempting to bath without using soap. Once pt was given soap she held the bottle of soapin her hand rather than put it on the cloth; perseveration noted in activities     Vision       Perception     Praxis      Cognition Arousal/Alertness: Awake/alert Behavior During Therapy: Flat affect Overall Cognitive Status: Impaired/Different from baseline  Area of Impairment: Orientation;Attention;Following commands;Safety/judgement;Problem solving;Memory;Awareness                  Orientation Level: Disoriented to;Time;Situation Current Attention Level: Focused Memory: Decreased short-term memory Following Commands: Follows one step commands inconsistently;Follows one step commands with increased time Safety/Judgement: Decreased awareness of deficits(R sided neglect but does have fear of falling) Awareness: Emergent Problem Solving: Slow processing;Decreased initiation;Difficulty sequencing;Requires verbal cues;Requires tactile cues General Comments: delat; difficulty sequencing        Exercises     Shoulder Instructions       General Comments Pts BP upon therapist arrival was 152/89, then pt was transferred to [facility] and then to chair and BP increased to 162/78 and then to 164/82, RN notified and made aware and provided medication    Pertinent Vitals/ Pain       Pain Assessment: Faces Faces Pain Scale: No hurt  Home Living                                          Prior Functioning/Environment              Frequency           Progress Toward Goals  OT Goals(current goals can now be found in the care plan section)  Progress towards OT goals: Progressing toward goals  Acute Rehab OT Goals Patient Stated Goal: per daughters for pt to get better OT Goal Formulation: With family Time For Goal Achievement: 08/24/19 Potential to Achieve Goals: Good ADL Goals Pt Will Perform Grooming: with mod assist;sitting Pt Will Perform Lower Body Dressing: with mod assist;sit to/from stand Pt Will Transfer to Toilet: bedside commode;with min assist;ambulating Pt Will Perform Toileting - Clothing Manipulation and hygiene: with min assist;sit to/from stand;sitting/lateral leans Additional ADL Goal #1: Pt will follow simple commands during ADLs with Mod cues Additional ADL Goal #2: Pt will attend to R visual field during ADLs with Min cues  Plan Discharge plan remains appropriate    Co-evaluation    PT/OT/SLP  Co-Evaluation/Treatment: Yes Reason for Co-Treatment: Necessary to address cognition/behavior during functional activity;To address functional/ADL transfers   OT goals addressed during session: ADL's and self-care      AM-PAC OT "6 Clicks" Daily Activity     Outcome Measure   Help from another person eating meals?: A Little Help from another person taking care of personal grooming?: A Lot Help from another person toileting, which includes using toliet, bedpan, or urinal?: A Lot Help from another person bathing (including washing, rinsing, drying)?: A Lot Help from another person to put on and taking off regular upper body clothing?: A Lot Help from another person to put on and taking off regular lower body clothing?: A Lot 6 Click Score: 13    End of Session Equipment Utilized During Treatment: Gait belt  OT Visit Diagnosis: Unsteadiness on feet (R26.81);Other abnormalities of gait and mobility (R26.89);Muscle weakness (generalized) (M62.81);Other symptoms and signs involving cognitive function;Apraxia (R48.2)   Activity Tolerance Patient tolerated treatment well   Patient Left in chair;with call bell/phone within reach;with chair alarm set;with family/visitor present   Nurse Communication Mobility status        Time: 1021-1106 OT Time Calculation (min): 45 min  Charges: OT General Charges $OT Visit: 1 Visit OT Treatments $Self Care/Home Management : 23-37 mins  Maurie Boettcher, OT/L   Acute OT Clinical Specialist Acute Rehabilitation  Services Pager [phone] Office [phone]    [facility] 08/13/2019, 5:25 PM

## 2019-08-13 NOTE — Progress Notes (Signed)
  Speech Language Pathology Treatment: Cognitive-Linquistic  Patient Details Name: Michelle Carrillo MRN: [id number] DOB: [birth year] Today's Date: 08/13/2019 Time: [id number] SLP Time Calculation (min) (ACUTE ONLY): 46 min  Assessment / Plan / Recommendation Clinical Impression  Pt seen with daughter at bedside. Pt alert and cooperative. Still perseverative, but doing better with word finding than in prior session. SLP able to direct attention to naming task with multiple visual cues to make eye contact and stop ruminating on communicating about illness. Pt named 2/4 pictures with repetition cues fading to phonemic cues, fading to accurate naming with no cues. Sustained attention to coloring task with hand over hand assist. Pt could not name colors or identify colors in a field of two with heavy multimodal cueing. Discussed activities to facilitate language with her daughter (naming picutres in coloring book, talking about the animals, Watching funny or family videos and naming people and discussing events), Demosntrated cueing. Daughter concerned about causing pt distress, but we discussed the effort needed to improve. Will continue efforts.   HPI HPI: 69 year old female with cortically based hematoma with subdural extension. 3.9 x 3.7 x 3.2 cm (estimated volume 23 cc) acute intraparenchymal hemorrhage centered at the left parietal convexity with mild surrounding vasogenic edema.Went to OR for urgent craniotomy and subdural hematoma evacuation on 4/29.       SLP Plan  Continue with current plan of care       Recommendations                   Plan: Continue with current plan of care       GO              Herbie Baltimore, MA [place] Pager [phone] Office [phone]  Lynann Beaver 08/13/2019, 2:08 PM

## 2019-08-13 NOTE — Progress Notes (Signed)
Physical Therapy Treatment Patient Details Name: Michelle Carrillo MRN: [id number] DOB: 09-29-1950 Today's Date: 08/13/2019    History of Present Illness 69 y.o. female the history of hypertension who presents with altered mental status that started abruptly this evening. Pt's daughter noticed the pt had vomited and that her cognition was altered. Pt found to have acute SDH and L parietal occipital ICH. Pt underwent Left fronto-temporal- parietal     PT Comments    Pt's daughter Robbi Garter interpreted during session in addition to stratus. Pt with noted delayed processing, impaired comprehension even when spoken to in [place]. Pt also demonstrated R sided inattention and possible a R field cut as pt neglecting R side. Pt with improved transfer ability today however ambulation limited due to systolic BP increased [id number]. RN provided pt medication. Acute PT to cont to follow. Continue to recommend CIR upon d/c.    Follow Up Recommendations  CIR;Supervision/Assistance - 24 hour     Equipment Recommendations  Wheelchair (measurements PT);Wheelchair cushion (measurements PT)    Recommendations for Other Services Rehab consult     Precautions / Restrictions Precautions Precautions: Fall Precaution Comments: R sided neglect and possible field cut Restrictions Weight Bearing Restrictions: No    Mobility  Bed Mobility Overal bed mobility: Needs Assistance Bed Mobility: Supine to Sit     Supine to sit: Mod assist;+2 for physical assistance     General bed mobility comments: pt requiring max verbal and tactile cues to initiate transfer to EOB, HOB elevated  Transfers Overall transfer level: Needs assistance Equipment used: 2 person hand held assist Transfers: Sit to/from [facility] Sit to Stand: Mod assist;+2 physical assistance Stand pivot transfers: Mod assist;+2 physical assistance       General transfer comment: pt slow and guarded but steady, no knee  buckling, BP did increase above 0000000 systolic limiting ambulation today  Ambulation/Gait             General Gait Details: unable to ambulate as pt's systolic BP increased to [id number] and per MD orders must be below 160   Stairs             Wheelchair Mobility    Modified Rankin (Stroke Patients Only) Modified Rankin (Stroke Patients Only) Pre-Morbid Rankin Score: No symptoms Modified Rankin: Moderately severe disability     Balance Overall balance assessment: Needs assistance Sitting-balance support: No upper extremity supported;Feet supported Sitting balance-Leahy Scale: Fair Sitting balance - Comments: pt able to maintain EOB balance with close contact guard   Standing balance support: Bilateral upper extremity supported Standing balance-Leahy Scale: Poor Standing balance comment: minA x2 for static standing balance                            Cognition Arousal/Alertness: Awake/alert Behavior During Therapy: Flat affect Overall Cognitive Status: Impaired/Different from baseline Area of Impairment: Orientation;Attention;Following commands;Safety/judgement;Problem solving                 Orientation Level: Disoriented to;Time;Situation Current Attention Level: Focused   Following Commands: Follows one step commands inconsistently;Follows one step commands with increased time Safety/Judgement: Decreased awareness of deficits(R sided neglect but does have fear of falling)   Problem Solving: Slow processing;Decreased initiation;Difficulty sequencing;Requires verbal cues;Requires tactile cues General Comments: pt very delayed to follow commands, difficulty processing and sequencing when observed doing bathing on [facility] with OT      Exercises      General Comments General comments (skin integrity,  edema, etc.): Pts BP upon therapist arrival was 152/89, then pt was transferred to [facility] and then to chair and BP increased to 162/78 and then to 164/82, RN  notified and made aware and provided medication      Pertinent Vitals/Pain Pain Assessment: No/denies pain    Home Living                      Prior Function            PT Goals (current goals can now be found in the care plan section) Progress towards PT goals: Progressing toward goals    Frequency    Min 4X/week      PT Plan Current plan remains appropriate    Co-evaluation PT/OT/SLP Co-Evaluation/Treatment: Yes Reason for Co-Treatment: Complexity of the patient's impairments (multi-system involvement) PT goals addressed during session: Mobility/safety with mobility        AM-PAC PT "6 Clicks" Mobility   Outcome Measure  Help needed turning from your back to your side while in a flat bed without using bedrails?: A Lot Help needed moving from lying on your back to sitting on the side of a flat bed without using bedrails?: A Lot Help needed moving to and from a bed to a chair (including a wheelchair)?: A Lot Help needed standing up from a chair using your arms (e.g., wheelchair or bedside chair)?: A Lot Help needed to walk in hospital room?: A Lot Help needed climbing 3-5 steps with a railing? : A Lot 6 Click Score: 12    End of Session Equipment Utilized During Treatment: Gait belt Activity Tolerance: Treatment limited secondary to medical complications (Comment) Patient left: in chair;with call bell/phone within reach;with chair alarm set;with family/visitor present Nurse Communication: Mobility status PT Visit Diagnosis: Unsteadiness on feet (R26.81);Muscle weakness (generalized) (M62.81);Other symptoms and signs involving the nervous system (R29.898)     Time: [id number] PT Time Calculation (min) (ACUTE ONLY): 43 min  Charges:  $Therapeutic Activity: 8-22 mins                     Kittie Plater, PT, DPT Acute Rehabilitation Services Pager #: [phone] Office #: [phone]    Berline Lopes 08/13/2019, 12:17 PM

## 2019-08-13 NOTE — Progress Notes (Signed)
STROKE TEAM PROGRESS NOTE   INTERVAL HISTORY Daughter at bedside.  Patient more awake alert, interactive.  Able to repeat, not able to name, able to follow limited central commands, still not able follow peripheral commands.  Moving all extremities.  CT repeat showed slight decreased midline shift.  Vitals:   08/13/19 1000 08/13/19 1100 08/13/19 1200 08/13/19 1300  BP: (!) 153/118 (!) 164/80 129/64 (!) 143/66  Pulse: 75 78 63 67  Resp: 18 18 19 18   Temp:   98.2 F (36.8 C)   TempSrc:   Oral   SpO2: 98% 98% 94% 95%  Weight:      Height:        CBC:  Recent Labs  Lab 08/09/19 0247 08/09/19 0257 08/12/19 0610 08/13/19 0616  WBC 10.6*   < > 10.4 8.5  NEUTROABS 8.3*  --   --   --   HGB 13.6   < > 10.2* 10.1*  HCT 41.1   < > 31.7* 31.0*  MCV 94.7   < > 94.6 94.8  PLT 283   < > 243 272   < > = values in this interval not displayed.   Basic Metabolic Panel:  Recent Labs  Lab 08/12/19 0610 08/12/19 1103 08/13/19 0616 08/13/19 1025  NA 146*   < > 149* 145  K 4.0  --  3.5  --   CL 116*  --  115*  --   CO2 24  --  27  --   GLUCOSE 98  --  115*  --   BUN 13  --  11  --   CREATININE 0.52  --  0.50  --   CALCIUM 8.0*  --  8.2*  --    < > = values in this interval not displayed.   Lipid Panel:     Component Value Date/Time   CHOL 148 08/11/2019 0300   TRIG 83 08/11/2019 0300   HDL 48 08/11/2019 0300   CHOLHDL 3.1 08/11/2019 0300   VLDL 17 08/11/2019 0300   LDLCALC 83 08/11/2019 0300   HgbA1c:  Lab Results  Component Value Date   HGBA1C 5.7 (H) 08/09/2019   Urine Drug Screen:     Component Value Date/Time   LABOPIA NONE DETECTED 08/09/2019 1118   COCAINSCRNUR NONE DETECTED 08/09/2019 1118   LABBENZ NONE DETECTED 08/09/2019 1118   AMPHETMU NONE DETECTED 08/09/2019 1118   THCU NONE DETECTED 08/09/2019 1118   LABBARB NONE DETECTED 08/09/2019 1118    Alcohol Level     Component Value Date/Time   ETH <10 08/09/2019 0247    IMAGING past 24 hours  CT HEAD WO  CONTRAST  Result Date: 08/13/2019 CLINICAL DATA:  Cerebral hemorrhage suspected. Additional history provided: Cerebral hemorrhage, craniotomy. EXAM: CT HEAD WITHOUT CONTRAST TECHNIQUE: Contiguous axial images were obtained from the base of the skull through the vertex without intravenous contrast. COMPARISON:  Head CT 08/11/2019, brain MRI 08/09/2019, head CT 08/09/2019 FINDINGS: Brain: Postoperative changes related to previous left frontoparietal craniotomy and subdural/parenchymal hematoma evacuation. The residual hematoma centered within the left parietal lobe has not significantly changed, again measuring 3.4 x 2.7 cm in transaxial dimensions. Unchanged surrounding edema. Also unchanged from this prior examination, there is extra-axial fluid and blood products overlying the left cerebral convexity measuring up to 6 mm in thickness. Unchanged mass effect with 7 mm rightward midline shift. Continued partial effacement of the left lateral ventricle without evidence of ventricular entrapment. Persistent small volume pneumocephalus deep to a left-sided  cranioplasty. No interval intracranial abnormality is identified. Vascular: No new finding. Skull: Left-sided cranioplasty.  Otherwise unremarkable. Sinuses/Orbits: Visualized orbits show no acute finding. Left maxillary sinus mucous retention cyst. Mild scattered paranasal sinus mucosal thickening. Small air-fluid levels within the sphenoid sinuses. No significant mastoid effusion. IMPRESSION: 1. No significant interval change as compared to head CT 08/11/2019. 2. Postoperative changes from left parietal parenchymal and left subdural hematoma evacuation. Unchanged residual hematoma within the left parietal lobe. Stable extra-axial fluid and blood products overlying the left cerebral convexity. 3. Stable mass effect with 7 mm rightward midline shift. 4. Paranasal sinus disease as described. Electronically Signed   By: Kellie Simmering DO   On: 08/13/2019 08:38     PHYSICAL EXAM  Temp:  [97.7 F (36.5 C)-99.1 F (37.3 C)] 98.2 F (36.8 C) (05/03 1200) Pulse Rate:  [63-87] 67 (05/03 1300) Resp:  [9-32] 18 (05/03 1300) BP: (129-167)/(43-118) 143/66 (05/03 1300) SpO2:  [94 %-100 %] 95 % (05/03 1300)  General - Well nourished, well developed, in no apparent distress. Scalp dressing removed  Ophthalmologic - fundi not visualized due to noncooperation.  Cardiovascular - Regular rhythm and rate.  Neuro - awake alert eyes open, able to repeat but not name.  Able to follow limited midline commands such as open close mouth, stick out tongue, but not able to do close and open eyes. Still not following peripheral commands. Right gaze incomplete, question for right hemianopia, not consistently blinking to visual threat on the right, but blinking to the left. PERRL. Right mild facial droop. BUE and BLE equal strength today. DTR 1+ and no babinski. Sensation, coordination not cooperative and gait not tested.   ASSESSMENT/PLAN Michelle Carrillo is a 69 y.o. female with history of HTN, anxiety, depression. Presenting to ER with AMS, n/v. She was found to have a rather large left parieta ICH and pan-hemisphere left SDH which required urgent craniotomy on 4/29.   ICH - left parietal large ICH and pan-hemisphere left SDH s/p subdural evacuation - etiology unclear, ? hypertensive   CT - moderate cortically based hematoma with extension into the subdural space resulting in a fairly sizable subdural hematoma on the left.  MRI 08/09/19 - 4 cm left parietal parenchymal hematoma unchanged. Interval craniotomy and drainage of left subdural hematoma with mild amount of residual fluid in sub and blood in the left subdural space. Improvement in mass-effect and midline shift which now measures 4 mm to the right. No other areas of hemorrhage.  MRA 08/09/19 - unremarkable, no AVM or aneurysm  CT head 5/1 - Expected postoperative changes of left subdural and  partial parietal parenchymal hematoma evacuation. Mass effect has improved with persistent rightward midline shift measuring 7 mm  CT head 5/3 - stable post op changes, residual L parietal hematoma. 24mm midline shift. Sinus dz.  EF 60 - 65%. No cardiac source of emboli identified.   SCDs for VTE prophylaxis  None prior to admission, now on none d/t bleeding  Therapy recommendations:  CIR   Disposition:  pending  Cerebral edema  Aphasia worsened as per daughter  Likely related to worsening cerebral edema  MRI 4/29 - MLS 104mm post procedure  CT 5/1 - MLS 65mm  CT head 5/3 - stable post op changes, residual L parietal hematoma. 38mm midline shift.   On 3% saline - 08/11/19 - 75 cc / hr -> taper off in am  Na goal 150-155  Na 138->146 ->144->149->145  Na Q6h  Hypertension  Home  meds:  Zestril  Current meds - Normodyne and Cleviprex prn  Stable at high end  BP goal < 160  Off cleviprex  Put on lisinopril 20 bid and amlodipine 10 . Long-term BP goal normotensive  Hyperlipidemia  Home meds: none  LDL - 83   May consider statin on discharge  Other Stroke Risk Factors  Advanced age  Other Active Problems  Code Status - Full code  Keppra - seizure prophylaxis  Anemia - Hb - 13.6->9.9->10.4->10.1 (post op)  Leukocytosis - WBCs - 10.6->11.4->10.2->8.5  Hospital day # 4   I spent 25minutes in total face-to-face time with the patient, more than 50% of which was spent in counseling and coordination of care, reviewing test results, images and medication, and discussing the diagnosis of ICH, SDH, worsening brain edema and ahasia, hypertonic saline treatment, seizure prophylaxis, treatment plan and potential prognosis. This patient's care requiresreview of multiple databases, neurological assessment, discussion with family, other specialists and medical decision making of high complexity. I had long discussion with daughter at bedside, updated pt current condition,  treatment plan and potential prognosis, and answered all the questions. She expressed understanding and appreciation.   Rosalin Hawking, MD PhD Stroke Neurology 08/13/2019 3:59 PM  To contact Stroke Continuity provider, please refer to [email or web address] After hours, contact General Neurology

## 2019-08-14 DIAGNOSIS — I161 Hypertensive emergency: Secondary | ICD-10-CM

## 2019-08-14 LAB — BASIC METABOLIC PANEL
Anion gap: 10 (ref 5–15)
BUN: 9 mg/dL (ref 8–23)
CO2: 26 mmol/L (ref 22–32)
Calcium: 8.4 mg/dL — ABNORMAL LOW (ref 8.9–10.3)
Chloride: 114 mmol/L — ABNORMAL HIGH (ref 98–111)
Creatinine, Ser: 0.44 mg/dL (ref 0.44–1.00)
GFR calc Af Amer: >60 mL/min (ref >60)
GFR calc non Af Amer: >60 mL/min (ref >60)
Glucose, Bld: 129 mg/dL — ABNORMAL HIGH (ref 70–99)
Potassium: 3.2 mmol/L — ABNORMAL LOW (ref 3.5–5.1)
Sodium: 150 mmol/L — ABNORMAL HIGH (ref 135–145)

## 2019-08-14 LAB — GLUCOSE, CAPILLARY
Glucose-Capillary: 121 mg/dL — ABNORMAL HIGH (ref 70–99)
Glucose-Capillary: 131 mg/dL — ABNORMAL HIGH (ref 70–99)
Glucose-Capillary: 99 mg/dL (ref 70–99)
Glucose-Capillary: 102 mg/dL — ABNORMAL HIGH (ref 70–99)

## 2019-08-14 LAB — CBC
HCT: 32 % — ABNORMAL LOW (ref 36.0–46.0)
Hemoglobin: 10.3 g/dL — ABNORMAL LOW (ref 12.0–15.0)
MCH: 31 pg (ref 26.0–34.0)
MCHC: 32.2 g/dL (ref 30.0–36.0)
MCV: 96.4 fL (ref 80.0–100.0)
Platelets: 292 10*3/uL (ref 150–400)
RBC: 3.32 MIL/uL — ABNORMAL LOW (ref 3.87–5.11)
RDW: 13.2 % (ref 11.5–15.5)
WBC: 9.2 10*3/uL (ref 4.0–10.5)
nRBC: 0 % (ref 0.0–0.2)

## 2019-08-14 MED ORDER — PREDNISOLONE ACETATE 1 % OP SUSP
1.0000 [drp] | Freq: Two times a day (BID) | OPHTHALMIC | Status: DC
  Administered 2019-08-14 – 2019-08-23 (×19): 1 [drp] via OPHTHALMIC
  Filled 2019-08-14: qty 5

## 2019-08-14 MED ORDER — LABETALOL HCL 100 MG PO TABS
100.0000 mg | ORAL_TABLET | Freq: Three times a day (TID) | ORAL | Status: DC
  Administered 2019-08-14 – 2019-08-15 (×4): 100 mg via ORAL
  Filled 2019-08-14 (×4): qty 1

## 2019-08-14 MED ORDER — SODIUM CHLORIDE 3 % IV SOLN
INTRAVENOUS | Status: DC
Start: 2019-08-14 — End: 2019-08-14

## 2019-08-14 MED ORDER — SODIUM CHLORIDE 3 % IV SOLN
INTRAVENOUS | Status: DC
  Filled 2019-08-14: qty 500

## 2019-08-14 MED ORDER — SODIUM CHLORIDE 3 % IV SOLN
INTRAVENOUS | Status: AC
  Filled 2019-08-14: qty 500

## 2019-08-14 NOTE — Progress Notes (Signed)
Patient ID: Michelle Carrillo, female   DOB: [birth year], 69 y.o.   MRN: [id number] BP (!) 159/70   Pulse 72   Temp 98.3 F (36.8 C) (Axillary)   Resp 20   Ht 5' (1.524 m)   Wt 83.5 kg   SpO2 96%   BMI 35.95 kg/m  Alert oriented to person Will follow some simple commands. Fluent, speech is clear. Is obviously confused at times. Moving extremities well

## 2019-08-14 NOTE — Progress Notes (Signed)
STROKE TEAM PROGRESS NOTE   INTERVAL HISTORY Daughter at bedside.  Patient awake alert, interactive.  Able to repeat, not able to name, not able follow simple commands. More language output, sometimes word salad but sometimes meaningful sentences. Moving all extremities. Will taper off 3% saline.  Vitals:   08/14/19 0500 08/14/19 0700 08/14/19 0800 08/14/19 1006  BP: (!) 160/74 (!) 168/76 (!) 167/76 (!) 162/104  Pulse: 65 72 67   Resp: 16 13 17    Temp:   98 F (36.7 C)   TempSrc:   Oral   SpO2: 95% 96% 96%   Weight:      Height:        CBC:  Recent Labs  Lab 08/09/19 0247 08/09/19 0257 08/13/19 0616 08/14/19 0625  WBC 10.6*   < > 8.5 9.2  NEUTROABS 8.3*  --   --   --   HGB 13.6   < > 10.1* 10.3*  HCT 41.1   < > 31.0* 32.0*  MCV 94.7   < > 94.8 96.4  PLT 283   < > 272 292   < > = values in this interval not displayed.   Basic Metabolic Panel:  Recent Labs  Lab 08/13/19 0616 08/13/19 1025 08/13/19 2140 08/14/19 0625  NA 149*   < > 145 150*  K 3.5  --   --  3.2*  CL 115*  --   --  114*  CO2 27  --   --  26  GLUCOSE 115*  --   --  129*  BUN 11  --   --  9  CREATININE 0.50  --   --  0.44  CALCIUM 8.2*  --   --  8.4*   < > = values in this interval not displayed.   Lipid Panel:     Component Value Date/Time   CHOL 148 08/11/2019 0300   TRIG 83 08/11/2019 0300   HDL 48 08/11/2019 0300   CHOLHDL 3.1 08/11/2019 0300   VLDL 17 08/11/2019 0300   LDLCALC 83 08/11/2019 0300   HgbA1c:  Lab Results  Component Value Date   HGBA1C 5.7 (H) 08/09/2019   Urine Drug Screen:     Component Value Date/Time   LABOPIA NONE DETECTED 08/09/2019 1118   COCAINSCRNUR NONE DETECTED 08/09/2019 1118   LABBENZ NONE DETECTED 08/09/2019 1118   AMPHETMU NONE DETECTED 08/09/2019 1118   THCU NONE DETECTED 08/09/2019 1118   LABBARB NONE DETECTED 08/09/2019 1118    Alcohol Level     Component Value Date/Time   ETH <10 08/09/2019 0247    IMAGING past 24 hours  No results  found.  PHYSICAL EXAM  Temp:  [98 F (36.7 C)-98.8 F (37.1 C)] 98 F (36.7 C) (05/04 0800) Pulse Rate:  [63-81] 67 (05/04 0800) Resp:  [13-23] 17 (05/04 0800) BP: (129-168)/(61-108) 162/104 (05/04 1006) SpO2:  [93 %-100 %] 96 % (05/04 0800)  General - Well nourished, well developed, in no apparent distress.   Ophthalmologic - fundi not visualized due to noncooperation.  Cardiovascular - Regular rhythm and rate.  Neuro - awake alert, eyes open, able to repeat but not to name. Perseveration, not following commands, increased speech output with sometimes word salad but some meaningful sentences. No gaze preference, able to track bilaterally, not blinking to visual threat bilaterally but no obvious hemianopia. PERRL. Right mild facial droop. BUE and BLE equal strength today. DTR 1+ and no babinski. Sensation, coordination not cooperative and gait not tested.  ASSESSMENT/PLAN Ms. REMMI LANGERMAN is a 69 y.o. female with history of HTN, anxiety, depression. Presenting to ER with AMS, n/v. She was found to have a rather large left parieta ICH and pan-hemisphere left SDH which required urgent craniotomy on 4/29.   ICH - left parietal large ICH and pan-hemisphere left SDH s/p subdural evacuation - etiology unclear, ? hypertensive   CT - moderate cortically based hematoma with extension into the subdural space resulting in a fairly sizable subdural hematoma on the left.  MRI 08/09/19 - 4 cm left parietal parenchymal hematoma unchanged. Interval craniotomy and drainage of left subdural hematoma with mild amount of residual fluid in sub and blood in the left subdural space. Improvement in mass-effect and midline shift which now measures 4 mm to the right. No other areas of hemorrhage.  MRA 08/09/19 - unremarkable, no AVM or aneurysm  CT head 5/1 - Expected postoperative changes of left subdural and partial parietal parenchymal hematoma evacuation. Mass effect has improved with  persistent rightward midline shift measuring 7 mm  CT head 5/3 - stable post op changes, residual L parietal hematoma. 69mm midline shift. Sinus dz.  CT repeat in am  EF 60 - 65%. No cardiac source of emboli identified.   SCDs for VTE prophylaxis  None prior to admission, now on none d/t bleeding  Therapy recommendations:  CIR   Disposition:  pending  Cerebral edema  Aphasia worsened 5/1 as per daughter  Likely related to worsening cerebral edema  MRI 4/29 - MLS 55mm post procedure  CT 5/1 - MLS 47mm  CT head 5/3 - stable post op changes, residual L parietal hematoma. 24mm midline shift.   CT repeat in am  On 3% saline - 08/11/19 - 75 cc / hr -> taper   Na goal 145-155  Na 138->146 ->144->149->145->150  Na Q6h  Hypertension  Home meds:  Zestril  Current meds - Normodyne and Cleviprex prn  Stable at high end  BP goal < 160  Off cleviprex  On lisinopril 20 bid and amlodipine 10  Add labetalol 100 tid . Long-term BP goal normotensive  Hyperlipidemia  Home meds: none  LDL - 83   May consider low dose statin on discharge  Other Stroke Risk Factors  Advanced age  Other Active Problems  Code Status - Full code  Keppra - seizure prophylaxis  Anemia - Hb - 13.6->9.9->10.4->10.1->10.3  Leukocytosis - WBCs - 10.6->11.4->10.2->8.5->9.2  Hospital day # 5   I spent 93minutes in total face-to-face time with the patient, more than 50% of which was spent in counseling and coordination of care, reviewing test results, images and medication, and discussing the diagnosis of ICH, SDH, worsening brain edema and ahasia, hypertonic saline treatment, seizure prophylaxis, treatment plan and potential prognosis. This patient's care requiresreview of multiple databases, neurological assessment, discussion with family, other specialists and medical decision making of high complexity. I had long discussion with daughter at bedside, updated pt current condition, treatment  plan and potential prognosis, and answered all the questions. She expressed understanding and appreciation.   Rosalin Hawking, MD PhD Stroke Neurology 08/14/2019 11:23 AM  To contact Stroke Continuity provider, please refer to [email or web address] After hours, contact General Neurology

## 2019-08-14 NOTE — Progress Notes (Signed)
Physical Therapy Treatment Patient Details Name: Michelle Carrillo MRN: [id number] DOB: 19-Apr-1950 Today's Date: 08/14/2019    History of Present Illness 69 y.o. female the history of hypertension who presents with altered mental status that started abruptly this evening. Pt's daughter noticed the pt had vomited and that her cognition was altered. Pt found to have acute SDH and L parietal occipital ICH. Pt underwent Left fronto-temporal- parietal     PT Comments    Pt's daughter Delana Meyer present to translate. Pt continues to present with severe R sided neglect requiring tactile cues to tend to R side, turn to R during ambulation, and to use R UE and LE. Pt does not respond to verbal cues only. Pt with impaired sequencing and coordination of R UE and LE. Pt at increased falls risk with impaired balance. Pt with good home set up and support.  Continue to recommend CIR upon d/c. Acute PT to cont to follow.    Follow Up Recommendations  CIR;Supervision/Assistance - 24 hour     Equipment Recommendations  Rolling walker with 5" wheels    Recommendations for Other Services Rehab consult     Precautions / Restrictions Precautions Precautions: Fall Precaution Comments: R sided neglect and possible field cut Restrictions Weight Bearing Restrictions: No    Mobility  Bed Mobility               General bed mobility comments: pt up in chair upon PT arrival  Transfers Overall transfer level: Needs assistance Equipment used: Rolling walker (2 wheeled) Transfers: Sit to/from Stand Sit to Stand: Min assist;+2 physical assistance         General transfer comment: pt very delayed with command follow, difficulty sequencing hand placement, pt not using R UE despite having the strength requiring tactile cues to use to push up from chair and holding onto RW  Ambulation/Gait Ambulation/Gait assistance: Mod assist;+2 safety/equipment Gait Distance (Feet): 120 Feet Assistive device:  Rolling walker (2 wheeled) Gait Pattern/deviations: Decreased stride length;Decreased step length - left;Wide base of support Gait velocity: dec Gait velocity interpretation: <1.31 ft/sec, indicative of household ambulator General Gait Details: pt vearing to the L despite max verbal directional verbal cues and minA for walker management, despite max directional verbal cues to turn R to complete loop around unit, pt continuously turned to the L requiring maxA for walker managment   Stairs             Wheelchair Mobility    Modified Rankin (Stroke Patients Only) Modified Rankin (Stroke Patients Only) Pre-Morbid Rankin Score: No symptoms Modified Rankin: Moderately severe disability     Balance Overall balance assessment: Needs assistance Sitting-balance support: No upper extremity supported;Feet supported Sitting balance-Leahy Scale: Fair Sitting balance - Comments: pt able to maintain EOB balance with close contact guard   Standing balance support: Bilateral upper extremity supported Standing balance-Leahy Scale: Poor Standing balance comment: reliant on physical assist or RW                            Cognition Arousal/Alertness: Awake/alert Behavior During Therapy: Flat affect Overall Cognitive Status: Impaired/Different from baseline Area of Impairment: Orientation;Attention;Following commands;Safety/judgement;Problem solving;Memory;Awareness                 Orientation Level: Disoriented to;Time;Situation Current Attention Level: Focused Memory: Decreased recall of precautions;Decreased short-term memory Following Commands: Follows one step commands with increased time;Follows one step commands inconsistently Safety/Judgement: Decreased awareness of safety;Decreased awareness of deficits(severe  R sided neglect) Awareness: Emergent Problem Solving: Slow processing;Difficulty sequencing General Comments: pt with strong R sided neglect, despite max  verbal cues to turn head right, track right, or turn while walking to the R pt unable without physical cues      Exercises      General Comments General comments (skin integrity, edema, etc.): VSS, BP within parameters      Pertinent Vitals/Pain Pain Assessment: Faces Faces Pain Scale: No hurt Pain Location: head Pain Descriptors / Indicators: Aching Pain Intervention(s): Monitored during session    Home Living                      Prior Function            PT Goals (current goals can now be found in the care plan section) Progress towards PT goals: Progressing toward goals    Frequency    Min 4X/week      PT Plan Current plan remains appropriate    Co-evaluation              AM-PAC PT "6 Clicks" Mobility   Outcome Measure  Help needed turning from your back to your side while in a flat bed without using bedrails?: A Little Help needed moving from lying on your back to sitting on the side of a flat bed without using bedrails?: A Little Help needed moving to and from a bed to a chair (including a wheelchair)?: A Lot Help needed standing up from a chair using your arms (e.g., wheelchair or bedside chair)?: A Lot Help needed to walk in hospital room?: A Lot Help needed climbing 3-5 steps with a railing? : A Lot 6 Click Score: 14    End of Session Equipment Utilized During Treatment: Gait belt Activity Tolerance: Patient tolerated treatment well Patient left: in chair;with call bell/phone within reach;with chair alarm set;with family/visitor present Nurse Communication: Mobility status PT Visit Diagnosis: Unsteadiness on feet (R26.81);Muscle weakness (generalized) (M62.81);Other symptoms and signs involving the nervous system (R29.898)     Time: [id number] PT Time Calculation (min) (ACUTE ONLY): 24 min  Charges:  $Gait Training: 8-22 mins $Neuromuscular Re-education: 8-22 mins                     Kittie Plater, PT, DPT Acute Rehabilitation  Services Pager #: [phone] Office #: [phone]    Berline Lopes 08/14/2019, 1:26 PM

## 2019-08-14 NOTE — Progress Notes (Signed)
Inpatient Rehab Admissions Coordinator:   Met with pt and her daughter, Delana Meyer, at the bedside.  Family has discussed CIR and is hopeful for transfer as soon as pt medically ready and bed available.  I will continue to follow for timing.   Shann Medal, PT, DPT Admissions Coordinator [phone] 08/14/19  12:19 PM

## 2019-08-15 ENCOUNTER — Inpatient Hospital Stay (HOSPITAL_COMMUNITY): Payer: Medicare Other

## 2019-08-15 LAB — CBC
HCT: 29.9 % — ABNORMAL LOW (ref 36.0–46.0)
Hemoglobin: 9.7 g/dL — ABNORMAL LOW (ref 12.0–15.0)
MCH: 30.7 pg (ref 26.0–34.0)
MCHC: 32.4 g/dL (ref 30.0–36.0)
MCV: 94.6 fL (ref 80.0–100.0)
Platelets: 290 10*3/uL (ref 150–400)
RBC: 3.16 MIL/uL — ABNORMAL LOW (ref 3.87–5.11)
RDW: 13 % (ref 11.5–15.5)
WBC: 9.4 10*3/uL (ref 4.0–10.5)
nRBC: 0 % (ref 0.0–0.2)

## 2019-08-15 LAB — BASIC METABOLIC PANEL
Anion gap: 9 (ref 5–15)
BUN: 12 mg/dL (ref 8–23)
CO2: 25 mmol/L (ref 22–32)
Calcium: 8.3 mg/dL — ABNORMAL LOW (ref 8.9–10.3)
Chloride: 110 mmol/L (ref 98–111)
Creatinine, Ser: 0.49 mg/dL (ref 0.44–1.00)
GFR calc Af Amer: >60 mL/min (ref >60)
GFR calc non Af Amer: >60 mL/min (ref >60)
Glucose, Bld: 111 mg/dL — ABNORMAL HIGH (ref 70–99)
Potassium: 2.8 mmol/L — ABNORMAL LOW (ref 3.5–5.1)
Sodium: 144 mmol/L (ref 135–145)

## 2019-08-15 LAB — GLUCOSE, CAPILLARY
Glucose-Capillary: 124 mg/dL — ABNORMAL HIGH (ref 70–99)
Glucose-Capillary: 140 mg/dL — ABNORMAL HIGH (ref 70–99)
Glucose-Capillary: 84 mg/dL (ref 70–99)
Glucose-Capillary: 107 mg/dL — ABNORMAL HIGH (ref 70–99)

## 2019-08-15 MED ORDER — ACETAMINOPHEN 325 MG PO TABS
650.0000 mg | ORAL_TABLET | ORAL | Status: DC | PRN
  Administered 2019-08-17 – 2019-08-19 (×3): 650 mg via ORAL
  Filled 2019-08-15 (×3): qty 2

## 2019-08-15 MED ORDER — ACETAMINOPHEN 160 MG/5ML PO SOLN: 650.0000 mg | ORAL | Status: DC | PRN

## 2019-08-15 MED ORDER — POTASSIUM CHLORIDE CRYS ER 20 MEQ PO TBCR
40.0000 meq | EXTENDED_RELEASE_TABLET | ORAL | Status: AC
  Administered 2019-08-15 (×3): 40 meq via ORAL
  Filled 2019-08-15 (×3): qty 2

## 2019-08-15 MED ORDER — LABETALOL HCL 200 MG PO TABS
200.0000 mg | ORAL_TABLET | Freq: Three times a day (TID) | ORAL | Status: DC
  Administered 2019-08-15 – 2019-08-16 (×4): 200 mg via ORAL
  Filled 2019-08-15: qty 1
  Filled 2019-08-15 (×2): qty 2
  Filled 2019-08-15: qty 1

## 2019-08-15 MED ORDER — ACETAMINOPHEN 650 MG RE SUPP: 650.0000 mg | RECTAL | Status: DC | PRN

## 2019-08-15 NOTE — Progress Notes (Signed)
Physical Therapy Treatment Patient Details Name: Michelle Carrillo MRN: [id number] DOB: Aug 19, 1950 Today's Date: 08/15/2019    History of Present Illness 69 y.o. female the history of hypertension who presents with altered mental status that started abruptly this evening. Pt's daughter noticed the pt had vomited and that her cognition was altered. Pt found to have acute SDH and L parietal occipital ICH. Pt underwent Left fronto-temporal- parietal     PT Comments    Pt demonstrates improved activity tolerance, transfer quality, and static standing balance. Pt continues to demonstrate significant R inattention, bumping into objects on R side and requiring PT assistance to turn head toward R. Pt reports double vision not improved by use of her glasses, may benefit from temporary use of eye patch. Pt will benefit from continued acute PT POC to improve gait, mobility, and attention to R side. PT continues to recommend CIR at time of discharge as pt was independent prior to admission and continues to demonstrate significant functional deficits.  Follow Up Recommendations  CIR;Supervision/Assistance - 24 hour     Equipment Recommendations  Rolling walker with 5" wheels    Recommendations for Other Services       Precautions / Restrictions Precautions Precautions: Fall Precaution Comments: R sided neglect and possible field cut. Pt endorses double vision Restrictions Weight Bearing Restrictions: No    Mobility  Bed Mobility                  Transfers Overall transfer level: Needs assistance Equipment used: Rolling walker (2 wheeled) Transfers: Sit to/from Stand Sit to Stand: Min assist            Ambulation/Gait Ambulation/Gait assistance: Mod assist Gait Distance (Feet): 100 Feet(100' x 3 separated by standing breaks) Assistive device: Rolling walker (2 wheeled) Gait Pattern/deviations: Step-through pattern;Drifts right/left Gait velocity: reduced Gait velocity  interpretation: <1.8 ft/sec, indicate of risk for recurrent falls General Gait Details: pt drifting toward R side, always turns to left despite verbal cues to turn right, bumps into wall and desk on R side due to inattention, PT having to provide physical assistance to turn head toward R to recognize obstacles on that side   Stairs             Wheelchair Mobility    Modified Rankin (Stroke Patients Only) Modified Rankin (Stroke Patients Only) Pre-Morbid Rankin Score: No symptoms Modified Rankin: Moderately severe disability     Balance Overall balance assessment: Needs assistance Sitting-balance support: Single extremity supported;Feet supported Sitting balance-Leahy Scale: Fair Sitting balance - Comments: minG at edge of recliner   Standing balance support: Bilateral upper extremity supported Standing balance-Leahy Scale: Fair Standing balance comment: minG with BUE support of RW                            Cognition Arousal/Alertness: Awake/alert Behavior During Therapy: Flat affect Overall Cognitive Status: Impaired/Different from baseline Area of Impairment: Attention;Memory;Following commands;Safety/judgement;Awareness;Problem solving                   Current Attention Level: Focused Memory: Decreased recall of precautions;Decreased short-term memory Following Commands: Follows one step commands with increased time Safety/Judgement: Decreased awareness of deficits;Decreased awareness of safety Awareness: Emergent Problem Solving: Slow processing;Difficulty sequencing General Comments: pt with strong R inattention, always turning to left durign ambulation despite cues to turn right, not turning head past midline toward right side during activity.      Exercises  General Comments General comments (skin integrity, edema, etc.): VSS      Pertinent Vitals/Pain Pain Assessment: No/denies pain    Home Living                       Prior Function            PT Goals (current goals can now be found in the care plan section) Acute Rehab PT Goals Patient Stated Goal: per daughters for pt to get better Progress towards PT goals: Progressing toward goals    Frequency    Min 4X/week      PT Plan Current plan remains appropriate    Co-evaluation              AM-PAC PT "6 Clicks" Mobility   Outcome Measure  Help needed turning from your back to your side while in a flat bed without using bedrails?: A Little Help needed moving from lying on your back to sitting on the side of a flat bed without using bedrails?: A Little Help needed moving to and from a bed to a chair (including a wheelchair)?: A Little Help needed standing up from a chair using your arms (e.g., wheelchair or bedside chair)?: A Little Help needed to walk in hospital room?: A Lot Help needed climbing 3-5 steps with a railing? : Total 6 Click Score: 15    End of Session   Activity Tolerance: Patient tolerated treatment well Patient left: in chair;with call bell/phone within reach;with chair alarm set;with family/visitor present Nurse Communication: Mobility status PT Visit Diagnosis: Unsteadiness on feet (R26.81);Muscle weakness (generalized) (M62.81);Other symptoms and signs involving the nervous system (R29.898)     Time: 1711-1735 PT Time Calculation (min) (ACUTE ONLY): 24 min  Charges:  $Gait Training: 23-37 mins                     Zenaida Niece, PT, DPT Acute Rehabilitation Pager: [phone]    Zenaida Niece 08/15/2019, 6:06 PM

## 2019-08-15 NOTE — Progress Notes (Signed)
STROKE TEAM PROGRESS NOTE   INTERVAL HISTORY Pt daughter at bedside. Pt is lying in bed. Awake alert and more spontaneous speech. Pt was able to follow most central commands, but still not able to follow peripheral commands. CT head stable. Pt off 3% saline. Pending CIR.  Vitals:   08/15/19 0900 08/15/19 1000 08/15/19 1100 08/15/19 1200  BP: (!) 165/70 (!) 136/55 (!) 146/82 (!) 135/96  Pulse: 72 68 68 66  Resp: 18 (!) 22 16 19   Temp:      TempSrc:      SpO2: 97% 93% 95% 98%  Weight:      Height:        CBC:  Recent Labs  Lab 08/09/19 0247 08/09/19 0257 08/14/19 0625 08/15/19 0216  WBC 10.6*   < > 9.2 9.4  NEUTROABS 8.3*  --   --   --   HGB 13.6   < > 10.3* 9.7*  HCT 41.1   < > 32.0* 29.9*  MCV 94.7   < > 96.4 94.6  PLT 283   < > 292 290   < > = values in this interval not displayed.   Basic Metabolic Panel:  Recent Labs  Lab 08/14/19 0625 08/15/19 0216  NA 150* 144  K 3.2* 2.8*  CL 114* 110  CO2 26 25  GLUCOSE 129* 111*  BUN 9 12  CREATININE 0.44 0.49  CALCIUM 8.4* 8.3*   Lipid Panel:     Component Value Date/Time   CHOL 148 08/11/2019 0300   TRIG 83 08/11/2019 0300   HDL 48 08/11/2019 0300   CHOLHDL 3.1 08/11/2019 0300   VLDL 17 08/11/2019 0300   LDLCALC 83 08/11/2019 0300   HgbA1c:  Lab Results  Component Value Date   HGBA1C 5.7 (H) 08/09/2019   Urine Drug Screen:     Component Value Date/Time   LABOPIA NONE DETECTED 08/09/2019 1118   COCAINSCRNUR NONE DETECTED 08/09/2019 1118   LABBENZ NONE DETECTED 08/09/2019 1118   AMPHETMU NONE DETECTED 08/09/2019 1118   THCU NONE DETECTED 08/09/2019 1118   LABBARB NONE DETECTED 08/09/2019 1118    Alcohol Level     Component Value Date/Time   ETH <10 08/09/2019 0247    IMAGING past 24 hours No results found.  PHYSICAL EXAM    Temp:  [98.3 F (36.8 C)-98.5 F (36.9 C)] 98.3 F (36.8 C) (05/05 0800) Pulse Rate:  [62-75] 66 (05/05 1200) Resp:  [14-22] 19 (05/05 1200) BP: (111-169)/(55-96)  135/96 (05/05 1200) SpO2:  [92 %-98 %] 98 % (05/05 1200)  General - Well nourished, well developed, in no apparent distress.   Ophthalmologic - fundi not visualized due to noncooperation.  Cardiovascular - Regular rhythm and rate.  Neuro - awake alert, eyes open, more spontaneous speech and less word salad, but still has perseveration from time to time. Able to repeat but not to name. Able to follow most central commands but not peripheral commands. No gaze preference, able to track bilaterally, no obvious hemianopia. PERRL. Right slight facial droop. BUE and BLE equal strength today. DTR 1+ and no babinski. Sensation, coordination not cooperative and gait not tested.   ASSESSMENT/PLAN Ms. KELENE MOURADIAN is a 69 y.o. female with history of HTN, anxiety, depression. Presenting to ER with AMS, n/v. She was found to have a rather large left parieta ICH and pan-hemisphere left SDH which required urgent craniotomy on 4/29.   ICH - left parietal large ICH and pan-hemisphere left SDH s/p subdural evacuation -  etiology unclear, ? hypertensive   CT - moderate cortically based hematoma with extension into the subdural space resulting in a fairly sizable subdural hematoma on the left.  MRI 08/09/19 - 4 cm left parietal parenchymal hematoma unchanged. Interval craniotomy and drainage of left subdural hematoma with mild amount of residual fluid in sub and blood in the left subdural space. Improvement in mass-effect and midline shift which now measures 4 mm to the right. No other areas of hemorrhage.  MRA 08/09/19 - unremarkable, no AVM or aneurysm  CT head 5/1 - Expected postoperative changes of left subdural and partial parietal parenchymal hematoma evacuation. Mass effect has improved with persistent rightward midline shift measuring 7 mm  CT head 5/3 - stable post op changes, residual L parietal hematoma. 58mm midline shift. Sinus dz.  CT repeat 5/5 stable MLS, unchanged from 5/3   EF 60 -  65%. No cardiac source of emboli identified.   SCDs for VTE prophylaxis. No pharmaceutical prophylaxis at this time given hemorrhage   None prior to admission, now on none d/t bleeding  Therapy recommendations:  CIR   Disposition:  Pending  Pt medically stable and cleared for CIR  Cerebral edema  Aphasia worsened 5/1 as per daughter  Likely related to worsening cerebral edema  MRI 4/29 - MLS 40mm post procedure  CT 5/1 - MLS 71mm  CT head 5/3 - stable post op changes, residual L parietal hematoma. 50mm midline shift.   CT repeat 5/5 unchanged with stable MLS  On 3% saline off  Na 138->146 ->144->149->145->150->144  Hypertension  Home meds:  Zestril  Current meds - Normodyne and Cleviprex prn  Stable at high end  BP goal < 160  Off cleviprex  On lisinopril 20 bid and amlodipine 10  Increase labetalol 100 -> 200 tid . Long-term BP goal normotensive  Hyperlipidemia  Home meds: none  LDL - 83   May consider low dose statin on discharge  Other Stroke Risk Factors  Advanced age  Other Active Problems  Code Status - Full code  Keppra - seizure prophylaxis  Anemia - Hb - 13.6->9.9->10.4->10.1->10.3->9.7  Leukocytosis - WBCs - 10.6->11.4->10.2->8.5->9.2->9.4  Hypokalemia K 3.2->2.8 - supplement  Hospital day # 6   I spent 42minutes in total face-to-face time with the patient, more than 50% of which was spent in counseling and coordination of care, reviewing test results, images and medication, and discussing the diagnosis of ICH, SDH, worsening brain edema and ahasia, hypertonic saline treatment, seizure prophylaxis, treatment plan and potential prognosis. This patient's care requiresreview of multiple databases, neurological assessment, discussion with family, other specialists and medical decision making of high complexity. I had long discussion with daughter at bedside, updated pt current condition, treatment plan and potential prognosis, and answered  all the questions. She expressed understanding and appreciation.   Rosalin Hawking, MD PhD Stroke Neurology 08/15/2019 12:41 PM  To contact Stroke Continuity provider, please refer to [email or web address] After hours, contact General Neurology

## 2019-08-15 NOTE — Progress Notes (Signed)
Patient ID: Michelle Carrillo, female   DOB: October 24, 1950, 69 y.o.   MRN: [id number] BP (!) 119/50   Pulse 64   Temp 97.7 F (36.5 C) (Oral)   Resp 20   Ht 5' (1.524 m)   Wt 83.5 kg   SpO2 99%   BMI 35.95 kg/m  Alert, confused. Follows some commands Wound is clean, dry, no signs of infection Head CT shows little to no change, hematoma still intact. Small amount of subdural blood Doing well

## 2019-08-16 ENCOUNTER — Inpatient Hospital Stay (HOSPITAL_COMMUNITY): Payer: Medicare Other

## 2019-08-16 DIAGNOSIS — Z9889 Other specified postprocedural states: Secondary | ICD-10-CM

## 2019-08-16 DIAGNOSIS — G936 Cerebral edema: Secondary | ICD-10-CM | POA: Diagnosis not present

## 2019-08-16 LAB — CBC
HCT: 30.4 % — ABNORMAL LOW (ref 36.0–46.0)
Hemoglobin: 10.1 g/dL — ABNORMAL LOW (ref 12.0–15.0)
MCH: 31.6 pg (ref 26.0–34.0)
MCHC: 33.2 g/dL (ref 30.0–36.0)
MCV: 95 fL (ref 80.0–100.0)
Platelets: 305 10*3/uL (ref 150–400)
RBC: 3.2 MIL/uL — ABNORMAL LOW (ref 3.87–5.11)
RDW: 13.2 % (ref 11.5–15.5)
WBC: 10 10*3/uL (ref 4.0–10.5)
nRBC: 0 % (ref 0.0–0.2)

## 2019-08-16 LAB — BASIC METABOLIC PANEL
Anion gap: 7 (ref 5–15)
BUN: 18 mg/dL (ref 8–23)
CO2: 25 mmol/L (ref 22–32)
Calcium: 8.3 mg/dL — ABNORMAL LOW (ref 8.9–10.3)
Chloride: 109 mmol/L (ref 98–111)
Creatinine, Ser: 0.55 mg/dL (ref 0.44–1.00)
GFR calc Af Amer: >60 mL/min (ref >60)
GFR calc non Af Amer: >60 mL/min (ref >60)
Glucose, Bld: 101 mg/dL — ABNORMAL HIGH (ref 70–99)
Potassium: 3.9 mmol/L (ref 3.5–5.1)
Sodium: 141 mmol/L (ref 135–145)

## 2019-08-16 LAB — GLUCOSE, CAPILLARY
Glucose-Capillary: 95 mg/dL (ref 70–99)
Glucose-Capillary: 132 mg/dL — ABNORMAL HIGH (ref 70–99)
Glucose-Capillary: 114 mg/dL — ABNORMAL HIGH (ref 70–99)
Glucose-Capillary: 127 mg/dL — ABNORMAL HIGH (ref 70–99)
Glucose-Capillary: 101 mg/dL — ABNORMAL HIGH (ref 70–99)

## 2019-08-16 MED ORDER — LISINOPRIL 20 MG PO TABS
20.0000 mg | ORAL_TABLET | Freq: Every day | ORAL | Status: DC
  Administered 2019-08-17 – 2019-08-23 (×7): 20 mg via ORAL
  Filled 2019-08-16 (×3): qty 1
  Filled 2019-08-16 (×2): qty 2
  Filled 2019-08-16: qty 1
  Filled 2019-08-16: qty 2

## 2019-08-16 MED ORDER — SODIUM CHLORIDE 0.9 % IV BOLUS
500.0000 mL | Freq: Once | INTRAVENOUS | Status: AC
  Administered 2019-08-16: 17:00:00 500 mL via INTRAVENOUS

## 2019-08-16 MED ORDER — SODIUM CHLORIDE 0.9 % IV BOLUS
500.0000 mL | Freq: Once | INTRAVENOUS | Status: AC
  Administered 2019-08-16: 500 mL via INTRAVENOUS

## 2019-08-16 NOTE — Progress Notes (Addendum)
Called by RN that patient was sitting at 4:40 PM working with PT/OT, but found to be nonverbal not talking, not answer questions, but eyes open awake.  She was given labetalol 200 mg at 3:52 PM.  SBP at this time 100s, heart rate 70s. Concerning for pre-syncope due to low BP with BP meds. Requested to put pt back to bed, head of bed flat, check glucose level, bed rest for today, stat head CT and EEG, d/c labetalol po. Will continue to monitor.   Rosalin Hawking, MD PhD Stroke Neurology 08/16/2019 4:58 PM    ADDENDUM  Pt seen and examined at bedside. Daughter and RN are at the bedside. Pt awake alert able to speak some words, such as her name, "a little bit" "yes" "no" "you are good" but decreased speech output than yesterday, more pronounced word finding difficulty. Not following central or peripheral commands. Moving all extremities equally. BP 100s after 500 NS bolus, ordered another 500 bolus, BP up to 120s. Put bed in trendelenburg position. Pending CT, EEG. Discussed with daughter at bedside and answered all her questions.  Rosalin Hawking, MD PhD Stroke Neurology 08/16/2019 5:53 PM

## 2019-08-16 NOTE — Progress Notes (Signed)
PT Cancellation Note  Patient Details Name: Michelle Carrillo MRN: [id number] DOB: 10/09/1950   Cancelled Treatment:    Reason Eval/Treat Not Completed: Medical issues which prohibited therapy. PT arriving to room at 1630, pt standing at sink with OT and becomes stiff with reduced verbal responsiveness and impaired ability to follow commands. PT assists OT in transferring pt to recliner and later to bed from recliner, also providing support to nursing staff as needed during neuro and vitals assessment. PT will follow up for further acute PT services when pt is more medically stable and more appropriate for skilled PT intervention.   Zenaida Niece 08/16/2019, 5:18 PM

## 2019-08-16 NOTE — Progress Notes (Signed)
Patient transferred to 4NP06. Report given to [facility], [place]

## 2019-08-16 NOTE — Progress Notes (Signed)
Per OT, they were working with pt and had her seated at sink.  Upon standing pt became very stiff and began flexing forward.  Pt unable to answer questions and nonverbal, would follow minimal command----would squeeze left hand but not right, however pt was able to lift right arm.  Dr. Erlinda Hong notified and new, STAT order received.  Pt was put back to bed with HOB flat.  Will continue to monitor closely.

## 2019-08-16 NOTE — Progress Notes (Signed)
Inpatient Rehab Admissions Coordinator:   I have no beds available for this patient to admit to CIR today.  Will continue to follow for timing of potential admission pending bed availability.   Avary Pitsenbarger, PT, DPT Admissions Coordinator [phone] 08/16/19  2:41 PM   

## 2019-08-16 NOTE — Progress Notes (Signed)
STROKE TEAM PROGRESS NOTE   INTERVAL HISTORY RN at bedside. Her daughter is not in room. Pt sitting in bed, awake alert, no acute event overnight, no complains.  Vitals and labs stable, pending CIR  Vitals:   08/16/19 0100 08/16/19 0200 08/16/19 0300 08/16/19 0322  BP: (!) 132/46 (!) 114/48 (!) 117/48 (!) 127/43  Pulse: 64 63 67   Resp: (!) 21 20 16 16   Temp:    98.2 F (36.8 C)  TempSrc:    Oral  SpO2: 96% 95% 92% 98%  Weight:      Height:        CBC:  Recent Labs  Lab 08/15/19 0216 08/16/19 0213  WBC 9.4 10.0  HGB 9.7* 10.1*  HCT 29.9* 30.4*  MCV 94.6 95.0  PLT 290 [id number]   Basic Metabolic Panel:  Recent Labs  Lab 08/15/19 0216 08/16/19 0213  NA 144 141  K 2.8* 3.9  CL 110 109  CO2 25 25  GLUCOSE 111* 101*  BUN 12 18  CREATININE 0.49 0.55  CALCIUM 8.3* 8.3*   Lipid Panel:     Component Value Date/Time   CHOL 148 08/11/2019 0300   TRIG 83 08/11/2019 0300   HDL 48 08/11/2019 0300   CHOLHDL 3.1 08/11/2019 0300   VLDL 17 08/11/2019 0300   LDLCALC 83 08/11/2019 0300   HgbA1c:  Lab Results  Component Value Date   HGBA1C 5.7 (H) 08/09/2019   Urine Drug Screen:     Component Value Date/Time   LABOPIA NONE DETECTED 08/09/2019 1118   COCAINSCRNUR NONE DETECTED 08/09/2019 1118   LABBENZ NONE DETECTED 08/09/2019 1118   AMPHETMU NONE DETECTED 08/09/2019 1118   THCU NONE DETECTED 08/09/2019 1118   LABBARB NONE DETECTED 08/09/2019 1118    Alcohol Level     Component Value Date/Time   ETH <10 08/09/2019 0247    IMAGING past 24 hours CT HEAD WO CONTRAST  Result Date: 08/15/2019 CLINICAL DATA:  Cerebral hemorrhage suspected.  Follow-up. EXAM: CT HEAD WITHOUT CONTRAST TECHNIQUE: Contiguous axial images were obtained from the base of the skull through the vertex without intravenous contrast. COMPARISON:  Prior head CT examinations 08/13/2019 and earlier. FINDINGS: Brain: Evolving postoperative changes related to prior left frontoparietal craniotomy and  subdural/parenchymal hematoma evacuation. Unchanged size of a residual hematoma centered within the left parietal lobe as compared to prior exam 08/13/2019. This again measures 3.4 x 2.7 cm in transaxial dimensions. Surrounding parenchymal edema has also not significantly changed. Also unchanged from this prior examination, there is extra-axial fluid and blood products as well as pneumocephalus overlying the left cerebral convexity with this collection measuring up to 6 mm in thickness. Stable mass effect with persistent 7 mm rightward midline shift with continued partial effacement of the left lateral ventricle. No evidence of ventricular entrapment. No interval intracranial abnormality is identified. Vascular: No hyperdense vessel.  Atherosclerotic calcifications. Skull: Left-sided cranioplasty.  Otherwise unremarkable. Sinuses/Orbits: Visualized orbits show no acute finding. No significant paranasal sinus disease or mastoid effusion at the imaged levels. Other: Left-sided scalp staples. IMPRESSION: 1. No significant interval change as compared to head CT 08/13/2019. 2. Postop changes from prior left parietal parenchymal and left subdural hematoma evacuation. 3. Unchanged residual hematoma within the left parietal lobe with stable surrounding parenchymal edema. 4. Stable left extra-axial fluid, blood products and pneumocephalus. 5. Stable mass effect with 7 mm rightward midline shift. Electronically Signed   By: Kellie Simmering DO   On: 08/15/2019 14:20    PHYSICAL EXAM  Temp:  [97.7 F (36.5 C)-98.4 F (36.9 C)] 98.2 F (36.8 C) (05/06 0322) Pulse Rate:  [62-69] 67 (05/06 0300) Resp:  [11-26] 16 (05/06 0322) BP: (96-146)/(34-96) 127/43 (05/06 0322) SpO2:  [92 %-100 %] 98 % (05/06 0322)  General - Well nourished, well developed, in no apparent distress.   Ophthalmologic - fundi not visualized due to noncooperation.  Cardiovascular - Regular rhythm and rate.  Neuro - awake alert, eyes open,  more spontaneous speech and less word salad, but still has perseveration from time to time. Able to repeat but not to name. Able to follow most central commands but not peripheral commands. No gaze preference, able to track bilaterally, no obvious hemianopia. PERRL. Right slight facial droop. BUE and BLE equal strength today. DTR 1+ and no babinski. Sensation, coordination not cooperative and gait not tested.   ASSESSMENT/PLAN Ms. GINA CERVANTEZ is a 69 y.o. female with history of HTN, anxiety, depression. Presenting to ER with AMS, n/v. She was found to have a rather large left parieta ICH and pan-hemisphere left SDH which required urgent craniotomy on 4/29.   ICH - left parietal large ICH and pan-hemisphere left SDH s/p subdural evacuation - etiology unclear, ? hypertensive   CT - moderate cortically based hematoma with extension into the subdural space resulting in a fairly sizable subdural hematoma on the left.  MRI 08/09/19 - 4 cm left parietal parenchymal hematoma unchanged. Interval craniotomy and drainage of left subdural hematoma with mild amount of residual fluid in sub and blood in the left subdural space. Improvement in mass-effect and midline shift which now measures 4 mm to the right. No other areas of hemorrhage.  MRA 08/09/19 - unremarkable, no AVM or aneurysm  CT head 5/1 - Expected postoperative changes of left subdural and partial parietal parenchymal hematoma evacuation. Mass effect has improved with persistent rightward midline shift measuring 7 mm  CT head 5/3 - stable post op changes, residual L parietal hematoma. 18mm midline shift. Sinus dz.  CT repeat 5/5 stable MLS, unchanged from 5/3   EF 60 - 65%. No cardiac source of emboli identified.   SCDs for VTE prophylaxis. No pharmaceutical prophylaxis at this time given hemorrhage   None prior to admission, now on none d/t bleeding  Therapy recommendations:  CIR   Disposition:  Pending  Pt medically stable and  cleared for CIR. Await bed. Transfer to [facility] tomorrow for ongoing care.  Cerebral edema  Aphasia worsened 5/1 as per daughter  Likely related to worsening cerebral edema  MRI 4/29 - MLS 27mm post procedure  CT 5/1 - MLS 63mm  CT head 5/3 - stable post op changes, residual L parietal hematoma. 9mm midline shift.   CT repeat 5/5 unchanged with stable MLS  3% saline off  Na 138->146 ->144->149->145->150->144->141  Hypertension  Home meds:  Zestril  Current meds - Normodyne and Cleviprex prn  Stable at high end  BP goal < 160  Off cleviprex  On lisinopril 20 bid and amlodipine 10  On labetalol 200 tid . Long-term BP goal normotensive  Hyperlipidemia  Home meds: none  LDL - 83   May consider low dose statin on discharge  Other Stroke Risk Factors  Advanced age  Other Active Problems  Code Status - Full code  Keppra - seizure prophylaxis  Anemia - Hb - 13.6->9.9->10.4->10.1->10.3->9.7->10.1  Leukocytosis - WBCs - 10.6->11.4->10.2->8.5->9.2->9.4->10.0  Hypokalemia K 3.2->2.8->3.9 - supplement  Hospital day # 7    Rosalin Hawking, MD PhD Stroke Neurology  08/16/2019 10:52 AM  To contact Stroke Continuity provider, please refer to [email or web address] After hours, contact General Neurology

## 2019-08-16 NOTE — Progress Notes (Signed)
Occupational Therapy Treatment Patient Details Name: Michelle Carrillo MRN: [id number] DOB: 12/08/1950 Today's Date: 08/16/2019    History of present illness 69 y.o. female the history of hypertension who presents with altered mental status that started abruptly this evening. Pt's daughter noticed the pt had vomited and that her cognition was altered. Pt found to have acute SDH and L parietal occipital ICH. Pt underwent Left fronto-temporal- parietal    OT comments  Pt motivated to participate in therapy with good support from family. Pt requiring Min A for functional mobility with use of RW. Pt performing oral care at sink while seated at [facility]; Min Guard A for balance and Max cues for sequencing of task. Pt requiring Max cues for locating objects in R visual field and sequencing. While at the sink, pt with unresponsive episode (rigidly flexing forward at sink, eyes open, and bowel incontinence). BP stable and RN notified. See general comments. Assist in returning pt to bed. Continue recommend dc to CIR and will continue to follow acutely as admitted.    Follow Up Recommendations  Supervision/Assistance - 24 hour;CIR    Equipment Recommendations  3 in 1 bedside commode    Recommendations for Other Services Rehab consult    Precautions / Restrictions Precautions Precautions: Fall Precaution Comments: R sided neglect and possible field cut. Pt endorses double vision Restrictions Weight Bearing Restrictions: No       Mobility Bed Mobility Overal bed mobility: Needs Assistance Bed Mobility: Supine to Sit     Supine to sit: Min assist     General bed mobility comments: Min A for managing BLEs  Transfers Overall transfer level: Needs assistance Equipment used: Rolling walker (2 wheeled) Transfers: Sit to/from Stand Sit to Stand: Min assist         General transfer comment: Min A for safety balance and power up    Balance Overall balance assessment: Needs  assistance Sitting-balance support: Single extremity supported;Feet supported Sitting balance-Leahy Scale: Fair     Standing balance support: Bilateral upper extremity supported;No upper extremity supported;During functional activity Standing balance-Leahy Scale: Fair                             ADL either performed or assessed with clinical judgement   ADL Overall ADL's : Needs assistance/impaired     Grooming: Oral care;Maximal assistance;Cueing for sequencing Grooming Details (indicate cue type and reason): Pt able to sit at sink on [facility] with Min Guard A. Requiring Max cues for sequencing and locating objects in right visual field. Providing hand over hand to reach for objects             Lower Body Dressing: Maximal assistance;Sit to/from stand Lower Body Dressing Details (indicate cue type and reason): Max A for initating donning of socks. hand over hand to reach forward and pull sock over heel Toilet Transfer: Minimal assistance;Ambulation;BSC;RW Toilet Transfer Details (indicate cue type and reason): Min A for power up and balance         Functional mobility during ADLs: Minimal assistance;Rolling walker General ADL Comments: Pt performing oral care and LB dressing. Requiring increased cues due to poor congition. After finishing oral care at sink, pt with episode of non-responsiveness. See general comments     Vision   Additional Comments: R inattention and possible field cut. Pt undershooting when reaching towards objects.   Perception     Praxis      Cognition Arousal/Alertness: Awake/alert Behavior  During Therapy: Flat affect Overall Cognitive Status: Impaired/Different from baseline Area of Impairment: Attention;Memory;Following commands;Safety/judgement;Awareness;Problem solving                   Current Attention Level: Focused Memory: Decreased recall of precautions;Decreased short-term memory Following Commands: Follows one step  commands with increased time;Follows one step commands inconsistently Safety/Judgement: Decreased awareness of deficits;Decreased awareness of safety Awareness: Intellectual Problem Solving: Slow processing;Difficulty sequencing General Comments: Pt continues to present with decreased following of commands and attention to right. Pt with increased verablization this session and communicating with her daughter (in [place]).         Exercises     Shoulder Instructions       General Comments After finishing oral care at sink (~15 min sitting OOB), pt standing to spit in sink. while standing at sink, pt becoming unresponsive and forward flexing at hips with rigid trunk (eyes remaining open and pt  having bowel incontience). Assisting pt to sit at [facility] and providing trunk support. Taking BP and stable. Notified RN. Transitioned pt to recliner and elevated BLEs. BP taken and stable. Assist to transfer pt to supine in bed. After 10 minutes, pt started answering daughter's questions with "yes".     Pertinent Vitals/ Pain       Pain Assessment: Faces Faces Pain Scale: No hurt Pain Intervention(s): Monitored during session;Limited activity within patient's tolerance;Repositioned  Home Living                                          Prior Functioning/Environment              Frequency  Min 2X/week        Progress Toward Goals  OT Goals(current goals can now be found in the care plan section)  Progress towards OT goals: Progressing toward goals  Acute Rehab OT Goals Patient Stated Goal: per daughters for pt to get better OT Goal Formulation: With family Time For Goal Achievement: 08/24/19 Potential to Achieve Goals: Good ADL Goals Pt Will Perform Grooming: with mod assist;sitting Pt Will Perform Lower Body Dressing: with mod assist;sit to/from stand Pt Will Transfer to Toilet: bedside commode;with min assist;ambulating Pt Will Perform Toileting - Clothing  Manipulation and hygiene: with min assist;sit to/from stand;sitting/lateral leans Additional ADL Goal #1: Pt will follow simple commands during ADLs with Mod cues Additional ADL Goal #2: Pt will attend to R visual field during ADLs with Min cues  Plan Discharge plan remains appropriate    Co-evaluation                 AM-PAC OT "6 Clicks" Daily Activity     Outcome Measure   Help from another person eating meals?: A Little Help from another person taking care of personal grooming?: A Lot Help from another person toileting, which includes using toliet, bedpan, or urinal?: A Lot Help from another person bathing (including washing, rinsing, drying)?: A Lot Help from another person to put on and taking off regular upper body clothing?: A Lot Help from another person to put on and taking off regular lower body clothing?: A Lot 6 Click Score: 13    End of Session Equipment Utilized During Treatment: Gait belt;Rolling walker  OT Visit Diagnosis: Unsteadiness on feet (R26.81);Other abnormalities of gait and mobility (R26.89);Muscle weakness (generalized) (M62.81);Other symptoms and signs involving cognitive function;Apraxia (R48.2)   Activity Tolerance  Patient tolerated treatment well   Patient Left with call bell/phone within reach;in bed;with nursing/sitter in room;with family/visitor present;with SCD's reapplied   Nurse Communication Mobility status;Other (comment)(Unresponsive episode)        Time: [id number] OT Time Calculation (min): 50 min  Charges: OT General Charges $OT Visit: 1 Visit OT Treatments $Self Care/Home Management : 38-52 mins  [place], OTR/L Acute Rehab Pager: [phone] Office: [place] 08/16/2019, 5:57 PM

## 2019-08-16 NOTE — Progress Notes (Signed)
EEG complete - results pending 

## 2019-08-17 DIAGNOSIS — R569 Unspecified convulsions: Secondary | ICD-10-CM

## 2019-08-17 LAB — URINALYSIS, COMPLETE (UACMP) WITH MICROSCOPIC
Bacteria, UA: NONE SEEN
Bilirubin Urine: NEGATIVE
Glucose, UA: NEGATIVE mg/dL
Ketones, ur: NEGATIVE mg/dL
Leukocytes,Ua: NEGATIVE
Nitrite: NEGATIVE
Protein, ur: 100 mg/dL — AB
Specific Gravity, Urine: 1.019 (ref 1.005–1.030)
pH: 7 (ref 5.0–8.0)

## 2019-08-17 LAB — BASIC METABOLIC PANEL
Anion gap: 8 (ref 5–15)
BUN: 15 mg/dL (ref 8–23)
CO2: 25 mmol/L (ref 22–32)
Calcium: 8.4 mg/dL — ABNORMAL LOW (ref 8.9–10.3)
Chloride: 106 mmol/L (ref 98–111)
Creatinine, Ser: 0.56 mg/dL (ref 0.44–1.00)
GFR calc Af Amer: >60 mL/min (ref >60)
GFR calc non Af Amer: >60 mL/min (ref >60)
Glucose, Bld: 95 mg/dL (ref 70–99)
Potassium: 4.2 mmol/L (ref 3.5–5.1)
Sodium: 139 mmol/L (ref 135–145)

## 2019-08-17 LAB — CBC
HCT: 32.3 % — ABNORMAL LOW (ref 36.0–46.0)
Hemoglobin: 10.5 g/dL — ABNORMAL LOW (ref 12.0–15.0)
MCH: 30.7 pg (ref 26.0–34.0)
MCHC: 32.5 g/dL (ref 30.0–36.0)
MCV: 94.4 fL (ref 80.0–100.0)
Platelets: 324 10*3/uL (ref 150–400)
RBC: 3.42 MIL/uL — ABNORMAL LOW (ref 3.87–5.11)
RDW: 12.8 % (ref 11.5–15.5)
WBC: 9.5 10*3/uL (ref 4.0–10.5)
nRBC: 0 % (ref 0.0–0.2)

## 2019-08-17 LAB — GLUCOSE, CAPILLARY
Glucose-Capillary: 81 mg/dL (ref 70–99)
Glucose-Capillary: 96 mg/dL (ref 70–99)
Glucose-Capillary: 95 mg/dL (ref 70–99)
Glucose-Capillary: 92 mg/dL (ref 70–99)

## 2019-08-17 LAB — SODIUM
Sodium: 141 mmol/L (ref 135–145)
Sodium: 139 mmol/L (ref 135–145)
Sodium: 144 mmol/L (ref 135–145)

## 2019-08-17 MED ORDER — MANNITOL 20 % IV SOLN
50.0000 g | Freq: Once | Status: AC
  Administered 2019-08-17: 13:00:00 50 g via INTRAVENOUS
  Filled 2019-08-17: qty 250

## 2019-08-17 MED ORDER — SODIUM CHLORIDE 3 % IV BOLUS
250.0000 mL | Freq: Once | INTRAVENOUS | Status: AC
  Administered 2019-08-17: 17:00:00 250 mL via INTRAVENOUS
  Filled 2019-08-17: qty 250

## 2019-08-17 MED ORDER — SODIUM CHLORIDE 3 % IV SOLN
INTRAVENOUS | Status: DC
  Administered 2019-08-17 – 2019-08-18 (×3): 75 mL/h via INTRAVENOUS
  Filled 2019-08-17 (×14): qty 500

## 2019-08-17 MED ORDER — LEVETIRACETAM 500 MG PO TABS
1000.0000 mg | ORAL_TABLET | Freq: Two times a day (BID) | ORAL | Status: DC
  Administered 2019-08-17 – 2019-08-23 (×12): 1000 mg via ORAL
  Filled 2019-08-17 (×13): qty 2

## 2019-08-17 NOTE — Progress Notes (Signed)
Inpatient Rehab Admissions Coordinator:   Met with pt and her daughter at bedside.  RN in to say pt transferring to 1M for 3% saline.  Will f/u on Monday.   Shann Medal, PT, DPT Admissions Coordinator [phone] 08/17/19  11:27 AM

## 2019-08-17 NOTE — Progress Notes (Addendum)
STROKE TEAM PROGRESS NOTE   INTERVAL HISTORY Pt daughter at bedside.  Patient continued to have global aphasia, BP improved to 150s, however aphasia did not improved from yesterday.  Patient awake alert, tracking bilaterally, moving all extremities.  Will restart 3%.  EEG showed no seizure.  However, prolonged postictal state is possible.  Vitals:   08/17/19 0000 08/17/19 0348 08/17/19 0722 08/17/19 1000  BP: (!) 127/58 (!) 126/95 120/63 (!) 148/61  Pulse: 62 67 68   Resp: 14 15 15 15   Temp: 98.2 F (36.8 C) 98.2 F (36.8 C) (!) 97.3 F (36.3 C)   TempSrc: Oral Oral Oral   SpO2: 99% 98% 100%   Weight:      Height:        CBC:  Recent Labs  Lab 08/16/19 0213 08/17/19 0544  WBC 10.0 9.5  HGB 10.1* 10.5*  HCT 30.4* 32.3*  MCV 95.0 94.4  PLT 305 0000000   Basic Metabolic Panel:  Recent Labs  Lab 08/16/19 0213 08/17/19 0544  NA 141 139  K 3.9 4.2  CL 109 106  CO2 25 25  GLUCOSE 101* 95  BUN 18 15  CREATININE 0.55 0.56  CALCIUM 8.3* 8.4*   Lipid Panel:     Component Value Date/Time   CHOL 148 08/11/2019 0300   TRIG 83 08/11/2019 0300   HDL 48 08/11/2019 0300   CHOLHDL 3.1 08/11/2019 0300   VLDL 17 08/11/2019 0300   LDLCALC 83 08/11/2019 0300   HgbA1c:  Lab Results  Component Value Date   HGBA1C 5.7 (H) 08/09/2019   Urine Drug Screen:     Component Value Date/Time   LABOPIA NONE DETECTED 08/09/2019 1118   COCAINSCRNUR NONE DETECTED 08/09/2019 1118   LABBENZ NONE DETECTED 08/09/2019 1118   AMPHETMU NONE DETECTED 08/09/2019 1118   THCU NONE DETECTED 08/09/2019 1118   LABBARB NONE DETECTED 08/09/2019 1118    Alcohol Level     Component Value Date/Time   ETH <10 08/09/2019 0247    IMAGING past 24 hours CT HEAD WO CONTRAST  Result Date: 08/16/2019 CLINICAL DATA:  Intracranial hemorrhage, follow-up EXAM: CT HEAD WITHOUT CONTRAST TECHNIQUE: Contiguous axial images were obtained from the base of the skull through the vertex without intravenous contrast.  COMPARISON:  08/15/2019 FINDINGS: Brain: Left parietal parenchymal hemorrhage is similar in size. Surrounding edema is also similar with adjacent sulcal and ventricular effacement. Residual mixed density extra-axial hematoma along the left cerebral convexity remain stable. There is no new hemorrhage. Rightward midline shift measures similar at 6 mm (7 mm). No hydrocephalus. There is no new loss of gray-white differentiation. Vascular: No new findings. Skull: Postoperative changes of left frontoparietal craniotomy. Sinuses/Orbits: No acute finding. Other: None. IMPRESSION: No substantial change in residual left parietal parenchymal hemorrhage and associated edema. Residual mixed density extra-axial hematoma along the left cerebral convexity is also unchanged. There is no new hemorrhage or worsening mass effect. Electronically Signed   By: Macy Mis M.D.   On: 08/16/2019 19:25   EEG adult  Result Date: 08/17/2019 Lora Havens, MD     08/17/2019 10:16 AM Patient Name: Michelle Carrillo MRN: [id number] Epilepsy Attending: Lora Havens Referring Physician/Provider: Dr. Rosalin Hawking Date: 08/16/2019 Duration: 23.31 minutes Patient history: 69 year old female with left parietal hemorrhage had an episode of transient speech disturbance.  EEG evaluate for seizures. Level of alertness: Awake AEDs during EEG study: Keppra Technical aspects: This EEG study was done with scalp electrodes positioned according to the 10-20 International  system of electrode placement. Electrical activity was acquired at a sampling rate of 500Hz  and reviewed with a high frequency filter of 70Hz  and a low frequency filter of 1Hz . EEG data were recorded continuously and digitally stored. Description: During awake state, no clear posterior on rhythm was seen.  EEG showed generalized polymorphic 9 to 10 Hz alpha activity.  EEG also showed continuous left posterior temporal 3 to 5 Hz theta-delta slowing.  Hyperventilation and photic  stimulation were not performed. Abnormality -Continuous slow, left posterior temporal region IMPRESSION: This study is suggestive of cortical dysfunction in left posterior temporal region likely secondary to underlying hemorrhage.  Additionally, there is evidence of mild to moderate diffuse encephalopathy, nonspecific etiology.  No seizures or definite epileptiform discharges were seen throughout the recording. Priyanka [name]    PHYSICAL EXAM  Temp:  [97.3 F (36.3 C)-98.3 F (36.8 C)] 97.3 F (36.3 C) (05/07 0722) Pulse Rate:  [62-68] 68 (05/07 0722) Resp:  [14-18] 15 (05/07 1000) BP: (107-148)/(47-95) 148/61 (05/07 1000) SpO2:  [98 %-100 %] 100 % (05/07 0722)  General - Well nourished, well developed, in no apparent distress.   Ophthalmologic - fundi not visualized due to noncooperation.  Cardiovascular - Regular rhythm and rate.  Neuro - awake alert, eyes open, global aphasia, not following central or peripheral commands, only has "yes", "okay", not able to repeat or name. No gaze preference, able to track bilaterally, no obvious hemianopia. PERRL. Right slight facial droop. BUE and BLE equal strength against gravity, at least 4/5. DTR 1+ and no babinski. Sensation, coordination not cooperative and gait not tested.   ASSESSMENT/PLAN Michelle Carrillo is a 69 y.o. female with history of HTN, anxiety, depression. Presenting to ER with AMS, n/v. She was found to have a rather large left parieta ICH and pan-hemisphere left SDH which required urgent craniotomy on 4/29.   ICH - left parietal large ICH and pan-hemisphere left SDH s/p subdural evacuation - etiology unclear, ? hypertensive   CT - moderate cortically based hematoma with extension into the subdural space resulting in a fairly sizable subdural hematoma on the left.  MRI 08/09/19 - 4 cm left parietal parenchymal hematoma unchanged. Interval craniotomy and drainage of left subdural hematoma with mild amount of  residual fluid in sub and blood in the left subdural space. Improvement in mass-effect and midline shift which now measures 4 mm to the right. No other areas of hemorrhage.  MRA 08/09/19 - unremarkable, no AVM or aneurysm  CT head 5/1 - Expected postoperative changes of left subdural and partial parietal parenchymal hematoma evacuation. Mass effect has improved with persistent rightward midline shift measuring 7 mm  CT head 5/3 - stable post op changes, residual L parietal hematoma. 66mm midline shift. Sinus dz.  CT repeat 5/5 stable MLS, unchanged from 5/3   CT repeat 5/6 stable MLS, unchanged from 5/5   2D echo EF 60 - 65%. No cardiac source of emboli identified.   EEG neg sz  SCDs for VTE prophylaxis. No pharmaceutical prophylaxis at this time given hemorrhage   None prior to admission, now on none d/t bleeding  Therapy recommendations:  CIR   Disposition:  Pending  Transferred to ICU   Worsening aphasia  5/1 worsening aphasia -> considered cerebral edema -> 3% saline -> improved  3% saline tapered off 5/5  5/6 worsening aphasia again while working with PT -> considered low BP with labetalol -> labetalol discontinued -> IVF bolus and bed rest -> EEG neg  for seizure and CT repeat stable  5/7 BP improved but aphasia did not improve -> back on 3% saline and one time mannitol  Also consider possible prolonged post ictal -> increase keppra dose to 1000 bid  Repeat EEG in am  Cerebral edema  Aphasia worsened 5/1 as per daughter  Likely related to worsening cerebral edema  MRI 4/29 - MLS 75mm post procedure  CT 5/1 - MLS 42mm  CT head 5/3 - stable post op changes, residual L parietal hematoma. 65mm midline shift.   CT repeat 5/5 unchanged with stable MLS  Back on 3% saline  Na 138->146 ->144->149->145->150->144->141->139  CT repeat 5/6 unchanged hematoma and stable MLS  Na Q6h, goal 145-155  Hypertension->Hypotension  Home meds:  Zestril  Current meds -  Normodyne and Cleviprex prn  Stable at high end  BP goal < 160  Off cleviprex . Now on lisinopril 20 daily, amlodipine 10, labetalol prn . Long-term BP goal normotensive  Hyperlipidemia  Home meds: none  LDL - 83   May consider low dose statin on discharge  Other Stroke Risk Factors  Advanced age  Other Active Problems  Code Status - Full code  Keppra - seizure prophylaxis - increase to 1000 bid  Anemia - Hb - 13.6->9.9->10.4->10.1->10.3->9.7->10.1->10.5  Leukocytosis - WBCs - 10.6->11.4->10.2->8.5->9.2->9.4->10.0->9.5  Hypokalemia K 3.2->2.8->3.9->4.2 - supplement  Hospital day # 8   I spent 5minutes in total face-to-face time with the patient, more than 50% of which was spent in counseling and coordination of care, reviewing test results, images and medication, and discussing the diagnosis ofICH, SDH, brain edema, worsening aphasia, hypertonic saline treatment, seizure prophylaxis, treatment plan and potential prognosis. This patient's care requiresreview of multiple databases, neurological assessment, discussion with family, other specialists and medical decision making of high complexity.I had long discussion withdaughterat bedside, updated pt current condition, treatment plan and potential prognosis, and answered all the questions. Sheexpressed understanding and appreciation.   Rosalin Hawking, MD PhD Stroke Neurology 08/17/2019 11:39 AM  To contact Stroke Continuity provider, please refer to [email or web address] After hours, contact General Neurology

## 2019-08-17 NOTE — Procedures (Signed)
Patient Name: Michelle Carrillo  MRN: [id number]  Epilepsy Attending: Lora Havens  Referring Physician/Provider: Dr. Rosalin Hawking Date: 08/16/2019 Duration: 23.31 minutes  Patient history: 69 year old female with left parietal hemorrhage had an episode of transient speech disturbance.  EEG evaluate for seizures.  Level of alertness: Awake  AEDs during EEG study: Keppra  Technical aspects: This EEG study was done with scalp electrodes positioned according to the 10-20 International system of electrode placement. Electrical activity was acquired at a sampling rate of 500Hz  and reviewed with a high frequency filter of 70Hz  and a low frequency filter of 1Hz . EEG data were recorded continuously and digitally stored.   Description: During awake state, no clear posterior on rhythm was seen.  EEG showed generalized polymorphic 9 to 10 Hz alpha activity.  EEG also showed continuous left posterior temporal 3 to 5 Hz theta-delta slowing.  Hyperventilation and photic stimulation were not performed.  Abnormality -Continuous slow, left posterior temporal region  IMPRESSION: This study is suggestive of cortical dysfunction in left posterior temporal region likely secondary to underlying hemorrhage.  Additionally, there is evidence of mild to moderate diffuse encephalopathy, nonspecific etiology.  No seizures or definite epileptiform discharges were seen throughout the recording.     Safi Culotta [name]

## 2019-08-17 NOTE — Progress Notes (Signed)
Inpatient Rehab Admissions Coordinator:   Note possible change in status yesterday; workup ongoing.  I will not have a bed available for this patient over the weekend.  Will f/u next week for possible admission pending bed availability and medical readiness.   Shann Medal, PT, DPT Admissions Coordinator [phone] 08/17/19  10:49 AM

## 2019-08-17 NOTE — Progress Notes (Signed)
Physical Therapy Treatment Patient Details Name: Michelle Carrillo MRN: [id number] DOB: Feb 10, 1951 Today's Date: 08/17/2019    History of Present Illness 69 y.o. female the history of hypertension who presents with altered mental status that started abruptly this evening. Pt's daughter noticed the pt had vomited and that her cognition was altered. Pt found to have acute SDH and L parietal occipital ICH. Pt underwent Left fronto-temporal- parietal CRANIOTOMY for subdural hematoma evacuation on 4/29. Pt requiring transfer to MICU on 5/7 for 3% saline solution administration.    PT Comments    Pt tolerated treatment well although with impaired command following compared to previous PT session. Pt with more difficulty following verbal and visual cues and requires PT to initiate mobility often during session. Part of difficulty may stem from PT utilizing video interpreting services this session when typically pt's daughter is present to interpret. Pt requiring significant assistance to mobilize to edge of bed and assistance for transfers on this date due to weakness and impaired balance. Pt with stable BP during session, monitored at rest, in sitting, and after transfer. Pt will benefit from continued acute PT POC to improve mobility and balance while reducing caregiver burden.  Follow Up Recommendations  CIR;Supervision/Assistance - 24 hour     Equipment Recommendations  Rolling walker with 5" wheels    Recommendations for Other Services       Precautions / Restrictions Precautions Precautions: Fall Precaution Comments: R sided neglect and possible field cut. Pt endorses double vision Restrictions Weight Bearing Restrictions: No    Mobility  Bed Mobility Overal bed mobility: Needs Assistance Bed Mobility: Supine to Sit     Supine to sit: Max assist;HOB elevated     General bed mobility comments: pt requiring significant assistance to mobilize to edge of bed largely due to  imapired command following  Transfers Overall transfer level: Needs assistance Equipment used: 1 person hand held assist Transfers: Sit to/from [facility] Sit to Stand: Mod assist Stand pivot transfers: Mod assist       General transfer comment: pt requires initiation of transfer with PT physically assisting pt to start stand motion and guiding pt toward recliner  Ambulation/Gait Ambulation/Gait assistance: Mod assist Gait Distance (Feet): 2 Feet Assistive device: 1 person hand held assist Gait Pattern/deviations: Shuffle Gait velocity: reduced Gait velocity interpretation: <1.31 ft/sec, indicative of household ambulator General Gait Details: short shuffling steps from bed to recliner   Stairs             Wheelchair Mobility    Modified Rankin (Stroke Patients Only) Modified Rankin (Stroke Patients Only) Pre-Morbid Rankin Score: No symptoms Modified Rankin: Moderately severe disability     Balance Overall balance assessment: Needs assistance Sitting-balance support: Single extremity supported;Feet unsupported Sitting balance-Leahy Scale: Fair Sitting balance - Comments: minG at edge of bed   Standing balance support: Bilateral upper extremity supported Standing balance-Leahy Scale: Poor Standing balance comment: modA to maintain static standing balance with BUE support of PT                            Cognition Arousal/Alertness: Awake/alert Behavior During Therapy: Flat affect Overall Cognitive Status: Impaired/Different from baseline Area of Impairment: Attention;Memory;Following commands;Safety/judgement;Awareness;Problem solving                   Current Attention Level: Focused Memory: Decreased recall of precautions;Decreased short-term memory Following Commands: Follows one step commands inconsistently Safety/Judgement: Decreased awareness of safety;Decreased awareness  of deficits Awareness: Intellectual Problem  Solving: Slow processing;Requires tactile cues;Requires verbal cues;Decreased initiation General Comments: pt with difficulty following verbal cues or demonstration, requires initiation of most mobility tasks this session to follow through. Pt with significant expressive aphasia.      Exercises      General Comments General comments (skin integrity, edema, etc.): pt VSS, BP of 164/79 with sitting and 144/64 after completion of transfer. Pt IV falls out during transfer, RN made aware and present to replace IV at end of session.      Pertinent Vitals/Pain Pain Assessment: Faces Faces Pain Scale: Hurts even more Pain Location: head Pain Descriptors / Indicators: Grimacing Pain Intervention(s): Monitored during session    Home Living                      Prior Function            PT Goals (current goals can now be found in the care plan section) Acute Rehab PT Goals Patient Stated Goal: per daughters for pt to get better Progress towards PT goals: Not progressing toward goals - comment(pt with increased difficulty following commands)    Frequency    Min 4X/week      PT Plan Current plan remains appropriate    Co-evaluation              AM-PAC PT "6 Clicks" Mobility   Outcome Measure  Help needed turning from your back to your side while in a flat bed without using bedrails?: A Lot Help needed moving from lying on your back to sitting on the side of a flat bed without using bedrails?: Total Help needed moving to and from a bed to a chair (including a wheelchair)?: A Lot Help needed standing up from a chair using your arms (e.g., wheelchair or bedside chair)?: A Lot Help needed to walk in hospital room?: A Lot Help needed climbing 3-5 steps with a railing? : Total 6 Click Score: 10    End of Session Equipment Utilized During Treatment: Gait belt Activity Tolerance: Patient tolerated treatment well Patient left: in chair;with chair alarm set;with  nursing/sitter in room Nurse Communication: Mobility status PT Visit Diagnosis: Unsteadiness on feet (R26.81);Muscle weakness (generalized) (M62.81);Other symptoms and signs involving the nervous system (R29.898)     Time: [id number] PT Time Calculation (min) (ACUTE ONLY): 29 min  Charges:  $Therapeutic Activity: 23-37 mins                     Zenaida Niece, PT, DPT Acute Rehabilitation Pager: [phone]    Zenaida Niece 08/17/2019, 2:42 PM

## 2019-08-18 ENCOUNTER — Inpatient Hospital Stay (HOSPITAL_COMMUNITY): Payer: Medicare Other

## 2019-08-18 LAB — BASIC METABOLIC PANEL
Anion gap: 7 (ref 5–15)
BUN: 11 mg/dL (ref 8–23)
CO2: 24 mmol/L (ref 22–32)
Calcium: 8.1 mg/dL — ABNORMAL LOW (ref 8.9–10.3)
Chloride: 116 mmol/L — ABNORMAL HIGH (ref 98–111)
Creatinine, Ser: 0.48 mg/dL (ref 0.44–1.00)
GFR calc Af Amer: >60 mL/min (ref >60)
GFR calc non Af Amer: >60 mL/min (ref >60)
Glucose, Bld: 90 mg/dL (ref 70–99)
Potassium: 3.9 mmol/L (ref 3.5–5.1)
Sodium: 147 mmol/L — ABNORMAL HIGH (ref 135–145)

## 2019-08-18 LAB — CBC
HCT: 31.7 % — ABNORMAL LOW (ref 36.0–46.0)
Hemoglobin: 10.4 g/dL — ABNORMAL LOW (ref 12.0–15.0)
MCH: 31.2 pg (ref 26.0–34.0)
MCHC: 32.8 g/dL (ref 30.0–36.0)
MCV: 95.2 fL (ref 80.0–100.0)
Platelets: 315 10*3/uL (ref 150–400)
RBC: 3.33 MIL/uL — ABNORMAL LOW (ref 3.87–5.11)
RDW: 13 % (ref 11.5–15.5)
WBC: 7.8 10*3/uL (ref 4.0–10.5)
nRBC: 0 % (ref 0.0–0.2)

## 2019-08-18 LAB — GLUCOSE, CAPILLARY
Glucose-Capillary: 79 mg/dL (ref 70–99)
Glucose-Capillary: 88 mg/dL (ref 70–99)
Glucose-Capillary: 88 mg/dL (ref 70–99)
Glucose-Capillary: 88 mg/dL (ref 70–99)

## 2019-08-18 LAB — SODIUM
Sodium: 145 mmol/L (ref 135–145)
Sodium: 139 mmol/L (ref 135–145)

## 2019-08-18 MED ORDER — MANNITOL 20 % IV SOLN
50.0000 g | Freq: Four times a day (QID) | Status: AC
  Administered 2019-08-18 (×2): 50 g via INTRAVENOUS
  Filled 2019-08-18 (×2): qty 250

## 2019-08-18 NOTE — Procedures (Addendum)
Patient Name: Michelle Carrillo  MRN: [id number]  Epilepsy Attending: Lora Havens  Referring Physician/Provider: Dr. Rosalin Hawking Date: 08/18/2019 Duration: 25.39 minutes  Patient history: 69 year old female with left parietal hemorrhage had an episode of transient speech disturbance.  EEG evaluate for seizures.  Level of alertness: Awake  AEDs during EEG study: Keppra  Technical aspects: This EEG study was done with scalp electrodes positioned according to the 10-20 International system of electrode placement. Electrical activity was acquired at a sampling rate of 500Hz  and reviewed with a high frequency filter of 70Hz  and a low frequency filter of 1Hz . EEG data were recorded continuously and digitally stored.   Description: The posterior dominant rhythm consists of 9-10 Hz activity of moderate voltage (25-35 uV) seen predominantly in posterior head regions, asymmetric ( left<right) and reactive to eye opening and eye closing. EEG also showed continuous rhythmic 3-5hz  theta-delta slowing in left hemisphere. Hyperventilation and photic stimulation were not performed.  Abnormality - Continuous rhythmic slow, left hemisphere - Background asymmetry, left<right  IMPRESSION: This study is suggestive of cortical dysfunction in left hemisphere likely secondary to underlying hemorrhage. No seizures or definite epileptiform discharges were seen throughout the recording.  Of note, lateralized rhythmic delta activity ( LRDA) is also on the ictal-interictal continuum and therefore recommend clinical correlation if concern for ictal activity persists.   Cordarrell Sane [name]

## 2019-08-18 NOTE — Progress Notes (Signed)
EEG complete - results pending 

## 2019-08-18 NOTE — Progress Notes (Signed)
STROKE TEAM PROGRESS NOTE   INTERVAL HISTORY The patient's daughter at the bedside.  I had a extensive discussion with her about the patient's case.  Daughter is concerned that the patient initially did very well postoperatively but has not done well since she was undergoing physical therapy and had her systolic blood pressure dropped to 107.  She tells me that she was speaking and conversational immediately postoperatively but not since this event.  She essentially has remained mostly mute only saying yes or no.  The patient has been tried on several maneuvers without any improvement in her condition.  She has been placed on 3% saline for the last 24 hours.  She also has been given a bolus of 3% saline.  Sodium is not gone above 150 however.  She also was given a dose of mannitol.  The patient and the nursing staff reports that she has not improved significantly.  She also has not heard Keppra increased to 1000 mg twice a day.  Vitals:   08/18/19 0700 08/18/19 0719 08/18/19 0800 08/18/19 0900  BP: (!) 143/58  134/63 139/62  Pulse: 65  82 80  Resp: 16  14 13   Temp:  97.8 F (36.6 C)    TempSrc:  Oral    SpO2: 99%  100% 100%  Weight:      Height:        CBC:  Recent Labs  Lab 08/17/19 0544 08/18/19 0426  WBC 9.5 7.8  HGB 10.5* 10.4*  HCT 32.3* 31.7*  MCV 94.4 95.2  PLT 324 [id number]   Basic Metabolic Panel:  Recent Labs  Lab 08/17/19 0544 08/17/19 1019 08/17/19 2204 08/18/19 0426  NA 139   < > 144 147*  K 4.2  --   --  3.9  CL 106  --   --  116*  CO2 25  --   --  24  GLUCOSE 95  --   --  90  BUN 15  --   --  11  CREATININE 0.56  --   --  0.48  CALCIUM 8.4*  --   --  8.1*   < > = values in this interval not displayed.   Lipid Panel:     Component Value Date/Time   CHOL 148 08/11/2019 0300   TRIG 83 08/11/2019 0300   HDL 48 08/11/2019 0300   CHOLHDL 3.1 08/11/2019 0300   VLDL 17 08/11/2019 0300   LDLCALC 83 08/11/2019 0300   HgbA1c:  Lab Results  Component Value Date    HGBA1C 5.7 (H) 08/09/2019   Urine Drug Screen:     Component Value Date/Time   LABOPIA NONE DETECTED 08/09/2019 1118   COCAINSCRNUR NONE DETECTED 08/09/2019 1118   LABBENZ NONE DETECTED 08/09/2019 1118   AMPHETMU NONE DETECTED 08/09/2019 1118   THCU NONE DETECTED 08/09/2019 1118   LABBARB NONE DETECTED 08/09/2019 1118    Alcohol Level     Component Value Date/Time   ETH <10 08/09/2019 0247    IMAGING past 24 hours  EEG adult 08/17/2019 IMPRESSION:  This study is suggestive of cortical dysfunction in left posterior temporal region likely secondary to underlying hemorrhage.  Additionally, there is evidence of mild to moderate diffuse encephalopathy, nonspecific etiology.  No seizures or definite epileptiform discharges were seen throughout the recording.    PHYSICAL EXAM  Temp:  [97.8 F (36.6 C)-98.6 F (37 C)] 97.8 F (36.6 C) (05/08 0719) Pulse Rate:  [60-82] 80 (05/08 0900) Resp:  [12-23] 13 (05/08  0900) BP: (121-164)/(45-114) 139/62 (05/08 0900) SpO2:  [95 %-100 %] 100 % (05/08 0900)  General - Well nourished, well developed, in no apparent distress.   Ophthalmologic - fundi not visualized due to noncooperation.  Cardiovascular - Regular rhythm and rate.  Neuro - awake alert, eyes open, global aphasia, not following central or peripheral commands, only has "yes", "okay", not able to repeat or name. No gaze preference, able to track bilaterally, no obvious hemianopia. PERRL. Right slight facial droop. BUE and BLE equal strength against gravity, at least 4/5. DTR 1+ and no babinski. Sensation, coordination not cooperative and gait not tested.   ASSESSMENT/PLAN Michelle Carrillo is a 69 y.o. female with history of HTN, anxiety, depression. Presenting to ER with AMS, n/v. She was found to have a rather large left parieta ICH and pan-hemisphere left SDH which required urgent craniotomy on 4/29.   ICH - left parietal large ICH and pan-hemisphere left SDH s/p  subdural evacuation - etiology unclear, ? hypertensive   CT - moderate cortically based hematoma with extension into the subdural space resulting in a fairly sizable subdural hematoma on the left.  MRI 08/09/19 - 4 cm left parietal parenchymal hematoma unchanged. Interval craniotomy and drainage of left subdural hematoma with mild amount of residual fluid in sub and blood in the left subdural space. Improvement in mass-effect and midline shift which now measures 4 mm to the right. No other areas of hemorrhage.  MRA 08/09/19 - unremarkable, no AVM or aneurysm  CT head 5/1 - Expected postoperative changes of left subdural and partial parietal parenchymal hematoma evacuation. Mass effect has improved with persistent rightward midline shift measuring 7 mm  CT head 5/3 - stable post op changes, residual L parietal hematoma. 72mm midline shift. Sinus dz.  CT repeat 5/5 stable MLS, unchanged from 5/3   CT repeat 5/6 stable MLS, unchanged from 5/5   2D echo EF 60 - 65%. No cardiac source of emboli identified.   EEG neg sz    SCDs for VTE prophylaxis. No pharmaceutical prophylaxis at this time given hemorrhage   None prior to admission, now on none d/t bleeding  Therapy recommendations:  CIR   Disposition:  Pending  Transferred to ICU   Worsening aphasia  5/1 worsening aphasia -> considered cerebral edema -> 3% saline -> improved  3% saline tapered off 5/5  5/6 worsening aphasia again while working with PT -> considered low BP with labetalol -> labetalol discontinued -> IVF bolus and bed rest -> EEG neg for seizure and CT repeat stable  5/7 BP improved but aphasia did not improve -> back on 3% saline and one time mannitol  Also consider possible prolonged post ictal -> increase keppra dose to 1000 bid  Repeat EEG - This study is suggestive of cortical dysfunction in left posterior temporal region likely secondary to underlying hemorrhage.  Additionally, there is evidence of mild to  moderate diffuse encephalopathy, nonspecific etiology.  No seizures or definite epileptiform discharges were seen throughout the recording. Per Dr Hortense Ramal- order long term EEG today if pt still aphasic.  Cerebral edema  Aphasia worsened 5/1 as per daughter  Likely related to worsening cerebral edema  MRI 4/29 - MLS 76mm post procedure  CT 5/1 - MLS 75mm  CT head 5/3 - stable post op changes, residual L parietal hematoma. 74mm midline shift.   CT repeat 5/5 unchanged with stable MLS  Back on 3% saline 75 cc/hr IV started 08/17/19  Na 138->146 ->144->149->145->150->144->141->139->144->147  CT repeat 5/6 unchanged hematoma and stable MLS  Na Q6h, goal 145-155  Hypertension->Hypotension  Home meds:  Zestril  Current meds - Normodyne and Cleviprex prn  Stable at high end  BP goal < 160  Off cleviprex . Now on lisinopril 20 daily, amlodipine 10, labetalol prn . Long-term BP goal normotensive  Hyperlipidemia  Home meds: none  LDL - 83   May consider low dose statin on discharge  Other Stroke Risk Factors  Advanced age  Other Active Problems  Code Status - Full code  Keppra - seizure prophylaxis - increase to 1000 bid  Anemia - Hb - 13.6->9.9->10.4->10.1->10.3->9.7->10.1->10.5->10.4  Leukocytosis - WBCs - 10.6->11.4->10.2->8.5->9.2->9.4->10.0->9.5->7.8 (afebrile)  Hypokalemia K 3.2->2.8->3.9->4.2 - supplement ->3.9  Hospital day # 9   1.  Critically ill patient with a large parenchymal left parietal hemorrhage with significant edema and status post subdural hematoma evacuation that was large.  Given no improvement with the 3% saline, this will be discontinued.  A trial of a couple doses of mannitol will be done.  Call from EEG lab had some concerns about the EEG findings that was repeated today.  24-hour continuous EEG is recommended.  Critical care time 40 minutes.   To contact Stroke Continuity provider, please refer to [email or web address] After hours, contact General  Neurology

## 2019-08-18 NOTE — Progress Notes (Signed)
LTM EEG hooked up and running - no initial skin breakdown - push button tested - neuro notified. New leads  Monitored

## 2019-08-19 LAB — BASIC METABOLIC PANEL
Anion gap: 13 (ref 5–15)
BUN: 10 mg/dL (ref 8–23)
CO2: 25 mmol/L (ref 22–32)
Calcium: 8.8 mg/dL — ABNORMAL LOW (ref 8.9–10.3)
Chloride: 104 mmol/L (ref 98–111)
Creatinine, Ser: 0.54 mg/dL (ref 0.44–1.00)
GFR calc Af Amer: >60 mL/min (ref >60)
GFR calc non Af Amer: >60 mL/min (ref >60)
Glucose, Bld: 101 mg/dL — ABNORMAL HIGH (ref 70–99)
Potassium: 3.5 mmol/L (ref 3.5–5.1)
Sodium: 142 mmol/L (ref 135–145)

## 2019-08-19 LAB — CBC
HCT: 35.3 % — ABNORMAL LOW (ref 36.0–46.0)
Hemoglobin: 11.5 g/dL — ABNORMAL LOW (ref 12.0–15.0)
MCH: 30.7 pg (ref 26.0–34.0)
MCHC: 32.6 g/dL (ref 30.0–36.0)
MCV: 94.1 fL (ref 80.0–100.0)
Platelets: 336 10*3/uL (ref 150–400)
RBC: 3.75 MIL/uL — ABNORMAL LOW (ref 3.87–5.11)
RDW: 13.2 % (ref 11.5–15.5)
WBC: 9.8 10*3/uL (ref 4.0–10.5)
nRBC: 0 % (ref 0.0–0.2)

## 2019-08-19 LAB — URINE CULTURE: Culture: >=100000 — AB

## 2019-08-19 LAB — GLUCOSE, CAPILLARY
Glucose-Capillary: 104 mg/dL — ABNORMAL HIGH (ref 70–99)
Glucose-Capillary: 109 mg/dL — ABNORMAL HIGH (ref 70–99)
Glucose-Capillary: 99 mg/dL (ref 70–99)

## 2019-08-19 NOTE — Plan of Care (Signed)
  Problem: Nutrition: Goal: Adequate nutrition will be maintained Outcome: Progressing   Problem: Elimination: Goal: Will not experience complications related to urinary retention Outcome: Progressing   Problem: Pain Managment: Goal: General experience of comfort will improve Outcome: Progressing   Problem: Skin Integrity: Goal: Risk for impaired skin integrity will decrease Outcome: Progressing   Problem: Elimination: Goal: Will not experience complications related to bowel motility Outcome: Not Progressing Note: Pt has not had bowel movement in 4 days

## 2019-08-19 NOTE — Procedures (Addendum)
Patient Name:Michelle Carrillo [id number] Epilepsy Attending:Sharnay Cashion [name] Referring Physician/Provider:David Rinehuls, NP Duration:08/18/2019 1605 to 08/19/2019 1605  Patient history:69 year old female with left parietal hemorrhage had an episode of transient speech disturbance. EEG evaluate for seizures.  Level of alertness:Awake, asleep  AEDs during EEG study:Keppra  Technical aspects: This EEG study was done with scalp electrodes positioned according to the 10-20 International system of electrode placement. Electrical activity was acquired at a sampling rate of 500Hz  and reviewed with a high frequency filter of 70Hz  and a low frequency filter of 1Hz . EEG data were recorded continuously and digitally stored.  Description: The posterior dominant rhythm consists of 9-10 Hz activity of moderate voltage (25-35 uV) seen predominantly in posterior head regions, asymmetric ( left<right) and reactive to eye opening and eye closing. Sleep was characterized by  Vertex waves, sleep spindles 912-14hz ), maximal frontocentral region. EEG also showed continuous 3-5hz  theta-delta slowing in left hemisphere. Hyperventilation and photic stimulation were not performed.  Abnormality - Continuous slow, left hemisphere - Background asymmetry, left<right  IMPRESSION: This study issuggestive of cortical dysfunction in left hemisphere likely secondary to underlying hemorrhage. No seizures ordefiniteepileptiform discharges were seen throughout the recording.  EEG appears to be improving compared to previous day.   Trammell Bowden [name]

## 2019-08-19 NOTE — Progress Notes (Signed)
LTM maint complete - no skin breakdown under: F8 F4 Fp2 A2  , maint  F4  T7

## 2019-08-19 NOTE — Progress Notes (Signed)
STROKE TEAM PROGRESS NOTE   INTERVAL HISTORY no interval change in the patient's mutism despite trial of different medications to reduce residual brain swelling.  The dose at the bedside in case is discussed at length with her.   Vitals:   08/19/19 0800 08/19/19 0900 08/19/19 1000 08/19/19 1100  BP: (!) 128/51 (!) 118/51 138/84 (!) 151/72  Pulse: 79 67 74 74  Resp: 14 17 18 20   Temp:      TempSrc:      SpO2: 100% 92% 96% 96%  Weight:      Height:        CBC:  Recent Labs  Lab 08/18/19 0426 08/19/19 0417  WBC 7.8 9.8  HGB 10.4* 11.5*  HCT 31.7* 35.3*  MCV 95.2 94.1  PLT 315 123456   Basic Metabolic Panel:  Recent Labs  Lab 08/18/19 0426 08/18/19 0958 08/18/19 1632 08/19/19 0417  NA 147*   < > 139 142  K 3.9  --   --  3.5  CL 116*  --   --  104  CO2 24  --   --  25  GLUCOSE 90  --   --  101*  BUN 11  --   --  10  CREATININE 0.48  --   --  0.54  CALCIUM 8.1*  --   --  8.8*   < > = values in this interval not displayed.   Lipid Panel:     Component Value Date/Time   CHOL 148 08/11/2019 0300   TRIG 83 08/11/2019 0300   HDL 48 08/11/2019 0300   CHOLHDL 3.1 08/11/2019 0300   VLDL 17 08/11/2019 0300   LDLCALC 83 08/11/2019 0300   HgbA1c:  Lab Results  Component Value Date   HGBA1C 5.7 (H) 08/09/2019   Urine Drug Screen:     Component Value Date/Time   LABOPIA NONE DETECTED 08/09/2019 1118   COCAINSCRNUR NONE DETECTED 08/09/2019 1118   LABBENZ NONE DETECTED 08/09/2019 1118   AMPHETMU NONE DETECTED 08/09/2019 1118   THCU NONE DETECTED 08/09/2019 1118   LABBARB NONE DETECTED 08/09/2019 1118    Alcohol Level     Component Value Date/Time   ETH <10 08/09/2019 0247    IMAGING past 24 hours  EEG adult 08/17/2019 IMPRESSION:  This study is suggestive of cortical dysfunction in left posterior temporal region likely secondary to underlying hemorrhage.  Additionally, there is evidence of mild to moderate diffuse encephalopathy, nonspecific etiology.  No  seizures or definite epileptiform discharges were seen throughout the recording.   Overnight EEG With Video 08/19/2019 IMPRESSION: This study issuggestive of cortical dysfunction in lefthemispherelikely secondary to underlying hemorrhage. No seizures ordefiniteepileptiform discharges were seen throughout the recording. EEG appears to be improving compared to previous day.   PHYSICAL EXAM   Temp:  [98 F (36.7 C)-98.8 F (37.1 C)] 98 F (36.7 C) (05/09 0700) Pulse Rate:  [60-79] 74 (05/09 1100) Resp:  [14-20] 20 (05/09 1100) BP: (108-151)/(47-84) 151/72 (05/09 1100) SpO2:  [92 %-100 %] 96 % (05/09 1100)  General - Well nourished, well developed, in no apparent distress.   Ophthalmologic - fundi not visualized due to noncooperation.  Cardiovascular - Regular rhythm and rate.  Neuro - awake alert, eyes open, global aphasia, not following central or peripheral commands, only has "yes", "okay", not able to repeat or name. No gaze preference, able to track bilaterally, no obvious hemianopia. PERRL. Right slight facial droop. BUE and BLE equal strength against gravity, at least 4/5. DTR  1+ and no babinski. Sensation, coordination not cooperative and gait not tested.   ASSESSMENT/PLAN Ms. LOUVELLA BENTHAM is a 69 y.o. female with history of HTN, anxiety, depression. Presenting to ER with AMS, n/v. She was found to have a rather large left parieta ICH and pan-hemisphere left SDH which required urgent craniotomy on 4/29.   ICH - left parietal large ICH and pan-hemisphere left SDH s/p subdural evacuation - etiology unclear, ? hypertensive   CT - moderate cortically based hematoma with extension into the subdural space resulting in a fairly sizable subdural hematoma on the left.  MRI 08/09/19 - 4 cm left parietal parenchymal hematoma unchanged. Interval craniotomy and drainage of left subdural hematoma with mild amount of residual fluid in sub and blood in the left subdural space.  Improvement in mass-effect and midline shift which now measures 4 mm to the right. No other areas of hemorrhage.  MRA 08/09/19 - unremarkable, no AVM or aneurysm  CT head 5/1 - Expected postoperative changes of left subdural and partial parietal parenchymal hematoma evacuation. Mass effect has improved with persistent rightward midline shift measuring 7 mm  CT head 5/3 - stable post op changes, residual L parietal hematoma. 48mm midline shift. Sinus dz.  CT repeat 5/5 stable MLS, unchanged from 5/3   CT repeat 5/6 stable MLS, unchanged from 5/5   2D echo EF 60 - 65%. No cardiac source of emboli identified.   EEG neg sz    Overnight EEG - 08/19/19 - This study issuggestive of cortical dysfunction in lefthemispherelikely secondary to underlying hemorrhage. No seizures ordefiniteepileptiform discharges were seen throughout the recording.  EEG appears to be improving compared to previous day.   SCDs for VTE prophylaxis. No pharmaceutical prophylaxis at this time given hemorrhage   None prior to admission, now on none d/t bleeding  Therapy recommendations:  CIR   Disposition:  Pending  Transferred to ICU   Worsening aphasia  5/1 worsening aphasia -> considered cerebral edema -> 3% saline -> improved  3% saline tapered off 5/5  5/6 worsening aphasia again while working with PT -> considered low BP with labetalol -> labetalol discontinued -> IVF bolus and bed rest -> EEG neg for seizure and CT repeat stable  5/7 BP improved but aphasia did not improve -> back on 3% saline and one time mannitol  Also consider possible prolonged post ictal -> increase keppra dose to 1000 bid  5/8 3% saline discontinued and 2 additional doses of mannitol given without any improvement  Repeat EEG - This study is suggestive of cortical dysfunction in left posterior temporal region likely secondary to underlying hemorrhage.  Additionally, there is evidence of mild to moderate diffuse encephalopathy,  nonspecific etiology.  No seizures or definite epileptiform discharges were seen throughout the recording.   Overnight EEG - 08/19/19 - This study issuggestive of cortical dysfunction in lefthemispherelikely secondary to underlying hemorrhage. No seizures ordefiniteepileptiform discharges were seen throughout the recording. EEG appears to be improving compared to previous day.   Cerebral edema  Aphasia worsened 5/1 as per daughter  Likely related to worsening cerebral edema  MRI 4/29 - MLS 49mm post procedure  CT 5/1 - MLS 80mm  CT head 5/3 - stable post op changes, residual L parietal hematoma. 55mm midline shift.   CT repeat 5/5 unchanged with stable MLS  Back on 3% saline 75 cc/hr IV started 08/17/19  Na 138->146 ->144->149->145->150->144->141->139->144->147->139->142  CT repeat 5/6 unchanged hematoma and stable MLS  Na Q6h, goal 145-155  Hypertension->Hypotension  Home meds:  Zestril   Current meds - Normodyne and Cleviprex prn  Stable at high end  BP goal < 160  Off cleviprex . Now on lisinopril 20 daily, amlodipine 10, labetalol prn . Long-term BP goal normotensive  Hyperlipidemia  Home meds: none  LDL - 83   May consider low dose statin on discharge  Other Stroke Risk Factors  Advanced age  Other Active Problems  Code Status - Full code  Keppra - seizure prophylaxis - increase to 1000 bid  Anemia - Hb - 13.6->9.9->10.4->10.1->10.3->9.7->10.1->10.5->10.4->11.5  Leukocytosis - WBCs - 10.6->11.4->10.2->8.5->9.2->9.4->10.0->9.5->7.8->9.8 (afebrile)  Hypokalemia K 3.2->2.8->3.9->4.2 - supplement ->3.9->3.5  Hospital day # 10   The case is discussed at length again with the patient's daughter at the bedside.  We will continue with the continuous EEG monitoring overnight.  To contact Stroke Continuity provider, please refer to [email or web address] After hours, contact General Neurology

## 2019-08-20 ENCOUNTER — Ambulatory Visit: Payer: Medicare Other

## 2019-08-20 ENCOUNTER — Inpatient Hospital Stay: Payer: Medicare Other

## 2019-08-20 DIAGNOSIS — G936 Cerebral edema: Secondary | ICD-10-CM

## 2019-08-20 LAB — GLUCOSE, CAPILLARY
Glucose-Capillary: 93 mg/dL (ref 70–99)
Glucose-Capillary: 95 mg/dL (ref 70–99)
Glucose-Capillary: 111 mg/dL — ABNORMAL HIGH (ref 70–99)
Glucose-Capillary: 130 mg/dL — ABNORMAL HIGH (ref 70–99)

## 2019-08-20 MED ORDER — BISACODYL 10 MG RE SUPP
10.0000 mg | Freq: Once | RECTAL | Status: AC
  Administered 2019-08-20: 16:00:00 10 mg via RECTAL
  Filled 2019-08-20: qty 1

## 2019-08-20 NOTE — Progress Notes (Signed)
veeg LTM complete. No skin breakdown

## 2019-08-20 NOTE — Progress Notes (Signed)
EEG maintenance complete. No skin breakdown at Fz, O1, P4 continue to monitor

## 2019-08-20 NOTE — Procedures (Signed)
Patient Name:Cerena E Karan [id number] Epilepsy Attending:Tiffeny Minchew [name] Referring [place], NP Duration:08/19/2019 1605 to 08/20/2019 1444  Patient history:69 year old female with left parietal hemorrhage had an episode of transient speech disturbance. EEG evaluate for seizures.  Level of alertness:Awake, asleep  AEDs during EEG study:Keppra  Technical aspects: This EEG study was done with scalp electrodes positioned according to the 10-20 International system of electrode placement. Electrical activity was acquired at a sampling rate of 500Hz  and reviewed with a high frequency filter of 70Hz  and a low frequency filter of 1Hz . EEG data were recorded continuously and digitally stored.  Description:The posterior dominant rhythm consists of 9-10 Hz activity of moderate voltage (25-35 uV) seen predominantly in posterior head regions,asymmetric( left<right)and reactive to eye opening and eye closing. Sleep was characterized by  Vertex waves, sleep spindles 912-14hz ), maximal frontocentral region. EEG also showed continuous 3-5hz  theta-delta slowing in left hemisphere.Hyperventilation and photic stimulation were not performed.  Abnormality - Continuousslow, lefthemisphere - Background asymmetry, left<right  IMPRESSION: This study issuggestive of cortical dysfunction in lefthemispherelikely secondary to underlying hemorrhage. No seizures ordefiniteepileptiform discharges were seen throughout the recording.  EEG appears to be similar compared to previous day.   Jovian Lembcke [name]

## 2019-08-20 NOTE — Progress Notes (Signed)
Occupational Therapy Treatment Patient Details Name: Michelle Carrillo MRN: [id number] DOB: 1950-10-05 Today's Date: 08/20/2019    History of present illness 69 y.o. female the history of hypertension who presents with altered mental status that started abruptly this evening. Pt's daughter noticed the pt had vomited and that her cognition was altered. Pt found to have acute SDH and L parietal occipital ICH. Pt underwent Left fronto-temporal- parietal CRANIOTOMY for subdural hematoma evacuation on 4/29. Pt requiring transfer to MICU on 5/7 for 3% saline solution administration.   OT comments  Pt making progress towards OT goals, presents supine in bed agreeable to treatment session. Focus of session on functional transfers and mobility tasks. Pt tolerating x2 bouts of mobility/activity (limited distance given pt on continuous EEG) with two person assist throughout. Pt fatigued with activity but overall tolerating session well. VSS throughout. Pt's daughter assisting with interpreting - appears pt continues to have aphasia (receptive vs expressive vs both) and apraxia as well, requiring multimodal/gestural cues for proper command following/task completion. Feel she remains an excellent candidate for CIR level therapies at time of discharge. Will continue to follow while acutely admitted.   Follow Up Recommendations  CIR;Supervision/Assistance - 24 hour    Equipment Recommendations  3 in 1 bedside commode          Precautions / Restrictions Precautions Precautions: Fall Precaution Comments: R sided neglect and possible field cut.       Mobility Bed Mobility Overal bed mobility: Needs Assistance Bed Mobility: Supine to Sit     Supine to sit: Max assist;HOB elevated     General bed mobility comments: pt needing multimodal cues for direction in addition to interpreted verbal direction.  Also needed truncal and LE assist.  Pt not attending to the R side to assist with getting to EOB.   Needed hand over hand assist for R UE w/bearing.  Transfers Overall transfer level: Needs assistance   Transfers: Sit to/from Stand   Stand pivot transfers: Min assist;Mod assist;+2 physical assistance       General transfer comment: cues for hand placement, varying levels of assist depending on patient levels of focus or understanding.    Balance Overall balance assessment: Needs assistance Sitting-balance support: Single extremity supported;No upper extremity supported;Feet supported;Feet unsupported Sitting balance-Leahy Scale: Fair Sitting balance - Comments: min guard at EOB, but pt unable to safely touch feet to the floor and appears unsafe.   Standing balance support: Bilateral upper extremity supported Standing balance-Leahy Scale: Poor Standing balance comment: reliant of external support                           ADL either performed or assessed with clinical judgement   ADL Overall ADL's : Needs assistance/impaired                                     Functional mobility during ADLs: +2 for physical assistance;Minimal assistance;Moderate assistance(HHA) General ADL Comments: focus on mobility tasks today, pt connected to continuous EEG so limited by lines, fatigued after mobility so not able to tolerate further participation in ADL                       Cognition Arousal/Alertness: Awake/alert Behavior During Therapy: WFL for tasks assessed/performed Overall Cognitive Status: Impaired/Different from baseline  Orientation Level: Disoriented to;Time;Situation Current Attention Level: Focused;Sustained   Following Commands: Follows one step commands inconsistently;Follows one step commands with increased time(per dtr speaking in Spanish) Safety/Judgement: Decreased awareness of safety;Decreased awareness of deficits Awareness: Intellectual Problem Solving: Slow processing;Requires tactile cues;Requires  verbal cues;Decreased initiation          Exercises     Shoulder Instructions       General Comments vss.  Concerning verbal cues in Spanisn, pt appears to be aphasic and/or apraxic frequently not able to execute a given task to command. and then at times execution seems to come easy.    Pertinent Vitals/ Pain       Pain Assessment: Faces Faces Pain Scale: No hurt Pain Intervention(s): Monitored during session  Home Living                                          Prior Functioning/Environment              Frequency  Min 2X/week        Progress Toward Goals  OT Goals(current goals can now be found in the care plan section)  Progress towards OT goals: Progressing toward goals  Acute Rehab OT Goals Patient Stated Goal: per daughters for pt to get better OT Goal Formulation: With family Time For Goal Achievement: 08/24/19 Potential to Achieve Goals: Good ADL Goals Pt Will Perform Grooming: with mod assist;sitting Pt Will Perform Lower Body Dressing: with mod assist;sit to/from stand Pt Will Transfer to Toilet: bedside commode;with min assist;ambulating Pt Will Perform Toileting - Clothing Manipulation and hygiene: with min assist;sit to/from stand;sitting/lateral leans Additional ADL Goal #1: Pt will follow simple commands during ADLs with Mod cues Additional ADL Goal #2: Pt will attend to R visual field during ADLs with Min cues  Plan Discharge plan remains appropriate    Co-evaluation    PT/OT/SLP Co-Evaluation/Treatment: Yes Reason for Co-Treatment: Complexity of the patient's impairments (multi-system involvement);Necessary to address cognition/behavior during functional activity;For patient/therapist safety;To address functional/ADL transfers   OT goals addressed during session: ADL's and self-care      AM-PAC OT "6 Clicks" Daily Activity     Outcome Measure   Help from another person eating meals?: A Little Help from another  person taking care of personal grooming?: A Lot Help from another person toileting, which includes using toliet, bedpan, or urinal?: A Lot Help from another person bathing (including washing, rinsing, drying)?: A Lot Help from another person to put on and taking off regular upper body clothing?: A Lot Help from another person to put on and taking off regular lower body clothing?: A Lot 6 Click Score: 13    End of Session    OT Visit Diagnosis: Unsteadiness on feet (R26.81);Other abnormalities of gait and mobility (R26.89);Muscle weakness (generalized) (M62.81);Other symptoms and signs involving cognitive function;Apraxia (R48.2)   Activity Tolerance Patient tolerated treatment well   Patient Left in chair;with call bell/phone within reach;with family/visitor present   Nurse Communication Mobility status        Time: 1030-1108 OT Time Calculation (min): 38 min  Charges: OT General Charges $OT Visit: 1 Visit OT Treatments $Therapeutic Activity: 8-22 mins  Lou Cal, OT Acute Rehabilitation Services Pager [phone] Office [phone]    Raymondo Band 08/20/2019, 5:12 PM

## 2019-08-20 NOTE — Progress Notes (Signed)
STROKE TEAM PROGRESS NOTE   INTERVAL HISTORY The patient's daughter at the bedside.  The patient is speaking a little bit now but still not quite fluently.  She seems to understand better.  Long-term EEG monitoring does not show any ongoing seizure activity and will be discontinued today.  Patient has been able to eat well.  She is not on any pressors.  Vitals:   08/20/19 1100 08/20/19 1120 08/20/19 1200 08/20/19 1300  BP: (!) 163/71  (!) 118/58 (!) 121/56  Pulse:   72 72  Resp: 17  16 15   Temp:  98.5 F (36.9 C)    TempSrc:  Oral    SpO2:   95% 96%  Weight:      Height:        CBC:  Recent Labs  Lab 08/18/19 0426 08/19/19 0417  WBC 7.8 9.8  HGB 10.4* 11.5*  HCT 31.7* 35.3*  MCV 95.2 94.1  PLT 315 [id number]   Basic Metabolic Panel:  Recent Labs  Lab 08/18/19 0426 08/18/19 0958 08/18/19 1632 08/19/19 0417  NA 147*   < > 139 142  K 3.9  --   --  3.5  CL 116*  --   --  104  CO2 24  --   --  25  GLUCOSE 90  --   --  101*  BUN 11  --   --  10  CREATININE 0.48  --   --  0.54  CALCIUM 8.1*  --   --  8.8*   < > = values in this interval not displayed.   Lipid Panel:     Component Value Date/Time   CHOL 148 08/11/2019 0300   TRIG 83 08/11/2019 0300   HDL 48 08/11/2019 0300   CHOLHDL 3.1 08/11/2019 0300   VLDL 17 08/11/2019 0300   LDLCALC 83 08/11/2019 0300   HgbA1c:  Lab Results  Component Value Date   HGBA1C 5.7 (H) 08/09/2019   Urine Drug Screen:     Component Value Date/Time   LABOPIA NONE DETECTED 08/09/2019 1118   COCAINSCRNUR NONE DETECTED 08/09/2019 1118   LABBENZ NONE DETECTED 08/09/2019 1118   AMPHETMU NONE DETECTED 08/09/2019 1118   THCU NONE DETECTED 08/09/2019 1118   LABBARB NONE DETECTED 08/09/2019 1118    Alcohol Level     Component Value Date/Time   ETH <10 08/09/2019 0247    IMAGING past 24 hours  EEG adult 08/17/2019 IMPRESSION:  This study is suggestive of cortical dysfunction in left posterior temporal region likely secondary to  underlying hemorrhage.  Additionally, there is evidence of mild to moderate diffuse encephalopathy, nonspecific etiology.  No seizures or definite epileptiform discharges were seen throughout the recording.    PHYSICAL EXAM  Temp:  [97.7 F (36.5 C)-98.7 F (37.1 C)] 98.5 F (36.9 C) (05/10 1120) Pulse Rate:  [57-80] 72 (05/10 1300) Resp:  [13-19] 15 (05/10 1300) BP: (85-165)/(42-135) 121/56 (05/10 1300) SpO2:  [92 %-100 %] 96 % (05/10 1300)  General - Well nourished, well developed, in no apparent distress.   Ophthalmologic - fundi not visualized due to noncooperation.  Cardiovascular - Regular rhythm and rate.  Neuro - awake alert, eyes open, global aphasia, not following central or peripheral commands but can follow only midline commands, only has "yes", "okay", not able to repeat or name. No gaze preference, able to track bilaterally, no obvious hemianopia. PERRL. Right slight facial droop. BUE and BLE equal strength against gravity, at least 4/5. DTR 1+ and no babinski.  Sensation, coordination not cooperative and gait not tested.   ASSESSMENT/PLAN Michelle Carrillo is a 69 y.o. female with history of HTN, anxiety, depression. Presenting to ER with AMS, n/v. She was found to have a rather large left parieta ICH and pan-hemisphere left SDH which required urgent craniotomy on 4/29.   ICH - left parietal large ICH and pan-hemisphere left SDH s/p subdural evacuation - etiology unclear, ? hypertensive   CT - moderate cortically based hematoma with extension into the subdural space resulting in a fairly sizable subdural hematoma on the left.  MRI 08/09/19 - 4 cm left parietal parenchymal hematoma unchanged. Interval craniotomy and drainage of left subdural hematoma with mild amount of residual fluid in sub and blood in the left subdural space. Improvement in mass-effect and midline shift which now measures 4 mm to the right. No other areas of hemorrhage.  MRA 08/09/19 -  unremarkable, no AVM or aneurysm  CT head 5/1 - Expected postoperative changes of left subdural and partial parietal parenchymal hematoma evacuation. Mass effect has improved with persistent rightward midline shift measuring 7 mm  CT head 5/3 - stable post op changes, residual L parietal hematoma. 68mm midline shift. Sinus dz.  CT repeat 5/5 stable MLS, unchanged from 5/3   CT repeat 5/6 stable MLS, unchanged from 5/5   2D echo EF 60 - 65%. No cardiac source of emboli identified.   EEG neg sz    SCDs for VTE prophylaxis. No pharmaceutical prophylaxis at this time given hemorrhage   None prior to admission, now on none d/t bleeding  Therapy recommendations:  CIR   Disposition:  Pending  Transferred to ICU   Worsening aphasia  5/1 worsening aphasia -> considered cerebral edema -> 3% saline -> improved  3% saline tapered off 5/5  5/6 worsening aphasia again while working with PT -> considered low BP with labetalol -> labetalol discontinued -> IVF bolus and bed rest -> EEG neg for seizure and CT repeat stable  5/7 BP improved but aphasia did not improve -> back on 3% saline and one time mannitol  Also consider possible prolonged post ictal -> increase keppra dose to 1000 bid  Repeat EEG - This study is suggestive of cortical dysfunction in left posterior temporal region likely secondary to underlying hemorrhage.  Additionally, there is evidence of mild to moderate diffuse encephalopathy, nonspecific etiology.  No seizures or definite epileptiform discharges were seen throughout the recording. Per Dr Hortense Ramal- order long term EEG today if pt still aphasic. Overnight EEG 08/19/2019: suggestive of cortical dysfunction in lefthemispherelikely secondary to underlying hemorrhage. No seizures ordefiniteepileptiform discharges were seen throughout the recording.  Improved compared to previous EEG. Cerebral edema  Aphasia worsened 5/1 as per daughter  Likely related to worsening cerebral  edema  MRI 4/29 - MLS 27mm post procedure  CT 5/1 - MLS 52mm  CT head 5/3 - stable post op changes, residual L parietal hematoma. 45mm midline shift.   CT repeat 5/5 unchanged with stable MLS  Back on 3% saline 75 cc/hr IV started 08/17/19  Na 138->146 ->144->149->145->150->144->141->139->144->147  CT repeat 5/6 unchanged hematoma and stable MLS  Na Q6h, goal 145-155  Hypertension->Hypotension  Home meds:  Zestril  Current meds - Normodyne and Cleviprex prn  Stable at high end  BP goal < 160  Off cleviprex . Now on lisinopril 20 daily, amlodipine 10, labetalol prn . Long-term BP goal normotensive  Hyperlipidemia  Home meds: none  LDL - 83   May consider  low dose statin on discharge  Other Stroke Risk Factors  Advanced age  Other Active Problems  Code Status - Full code  Keppra - seizure prophylaxis - increase to 1000 bid  Anemia - Hb - 13.6->9.9->10.4->10.1->10.3->9.7->10.1->10.5->10.4  Leukocytosis - WBCs - 10.6->11.4->10.2->8.5->9.2->9.4->10.0->9.5->7.8 (afebrile)  Hypokalemia K 3.2->2.8->3.9->4.2 - supplement ->3.9  Hospital day # 11  Patient's speech and aphasia seem minimally improved but long-term EEG monitoring for more than 2 days has not shown any ongoing seizure activity hence will discontinue it.  Continue ongoing medical management.  Mobilize out of bed.  Therapy consults.  Transfer to neurology floor bed.  Long discussion with patient and daughter at the bedside and answered questions.  Discussed with Dr. Hortense Ramal epileptologist.  Greater than 50% time during this 35-minute visit was spent on counseling and coordination of care about her aphasia and stroke and answering questions Antony Contras, [place] Pager: [phone] 08/20/2019 2:48 PM   To contact Stroke Continuity provider, please refer to [email or web address] After hours, contact General Neurology

## 2019-08-20 NOTE — Progress Notes (Signed)
Patient ID: Michelle Carrillo, female   DOB: [birth year], 69 y.o.   MRN: [id number] BP 126/61 (BP Location: Right Arm)   Pulse 72   Temp 98 F (36.7 C) (Oral)   Resp 16   Ht 5' (1.524 m)   Wt 83.5 kg   SpO2 99%   BMI 35.95 kg/m  Alert, non  Fluent Perrl, full eom Moving extremities Wound is clean, dry Will order CT for tomorrow.

## 2019-08-20 NOTE — Plan of Care (Signed)
  Problem: Clinical Measurements: Goal: Respiratory complications will improve Outcome: Progressing   Problem: Nutrition: Goal: Adequate nutrition will be maintained Outcome: Progressing Note: Pt is eating at every meal, but her intake is minimal   Problem: Elimination: Goal: Will not experience complications related to urinary retention Outcome: Progressing   Problem: Pain Managment: Goal: General experience of comfort will improve Outcome: Progressing   Problem: Activity: Goal: Risk for activity intolerance will decrease Outcome: Not Progressing Note: Pt bedrest for continuous EEG.    Problem: Elimination: Goal: Will not experience complications related to bowel motility Outcome: Not Progressing Note: Pt has not had bowel movement in 5 days. Senna ordered daily. Will address with MD.

## 2019-08-20 NOTE — Progress Notes (Signed)
Physical Therapy Treatment Patient Details Name: Michelle Carrillo MRN: [id number] DOB: 1951-03-29 Today's Date: 08/20/2019    History of Present Illness 69 y.o. female the history of hypertension who presents with altered mental status that started abruptly this evening. Pt's daughter noticed the pt had vomited and that her cognition was altered. Pt found to have acute SDH and L parietal occipital ICH. Pt underwent Left fronto-temporal- parietal CRANIOTOMY for subdural hematoma evacuation on 4/29. Pt requiring transfer to MICU on 5/7 for 3% saline solution administration.    PT Comments    Pt was agreeable to working with therapy.  Pt appeared to either not fully understand commands in Spanish or didn't know how to execute them.  Pt's daughter (interpreting) agreed.  Emphasis on getting pt to attend the R side, transition to EOB, improve safety and balance at EOB, sit to stand and short distance ambulation within limits of the EEG lines.   Follow Up Recommendations  CIR;Supervision/Assistance - 24 hour     Equipment Recommendations  Rolling walker with 5" wheels;Other (comment)(TBA)    Recommendations for Other Services       Precautions / Restrictions Precautions Precautions: Fall Precaution Comments: R sided neglect and possible field cut.    Mobility  Bed Mobility Overal bed mobility: Needs Assistance Bed Mobility: Supine to Sit     Supine to sit: Max assist;HOB elevated     General bed mobility comments: pt needing multimodal cues for direction in addition to interpreted verbal direction.  Also needed truncal and LE assist.  Pt not attending to the R side to assist with getting to EOB.  Needed hand over hand assist for R UE w/bearing.  Transfers Overall transfer level: Needs assistance   Transfers: Sit to/from Stand   Stand pivot transfers: Min assist;Mod assist;+2 physical assistance       General transfer comment: cues for hand placement, varying levels of  assist depending on patient levels of focus or understanding.  Ambulation/Gait Ambulation/Gait assistance: Min assist;+2 physical assistance Gait Distance (Feet): 10 Feet(x2 with sitting rest in between to regroup.) Assistive device: 2 person hand held assist Gait Pattern/deviations: Step-to pattern;Step-through pattern;Decreased step length - right;Decreased step length - left;Decreased stride length Gait velocity: reduced Gait velocity interpretation: <1.31 ft/sec, indicative of household ambulator General Gait Details: short shuffled or low amplitude steps, UE's held in high guard position.  pt voices her dislike of backing up.  Pt's daughter stating she relates feear of falling.   Stairs             Wheelchair Mobility    Modified Rankin (Stroke Patients Only) Modified Rankin (Stroke Patients Only) Modified Rankin: Moderately severe disability     Balance Overall balance assessment: Needs assistance Sitting-balance support: Single extremity supported;No upper extremity supported;Feet supported;Feet unsupported Sitting balance-Leahy Scale: Fair Sitting balance - Comments: min guard at EOB, but pt unable to safely touch feet to the floor and appears unsafe.   Standing balance support: Bilateral upper extremity supported Standing balance-Leahy Scale: Poor Standing balance comment: reliant of external support                            Cognition Arousal/Alertness: Awake/alert Behavior During Therapy: WFL for tasks assessed/performed Overall Cognitive Status: Impaired/Different from baseline                   Orientation Level: Disoriented to;Time;Situation Current Attention Level: Focused;Sustained   Following Commands: Follows one step commands  inconsistently;Follows one step commands with increased time(per dtr speaking in Spanish) Safety/Judgement: Decreased awareness of safety;Decreased awareness of deficits Awareness: Intellectual Problem  Solving: Slow processing;Requires tactile cues;Requires verbal cues;Decreased initiation        Exercises      General Comments General comments (skin integrity, edema, etc.): vss.  Concerning verbal cues in Spanisn, pt appears to be aphasic and/or apraxic frequently not able to execute a given task to command. and then at times execution seems to come easy.      Pertinent Vitals/Pain Pain Assessment: Faces Faces Pain Scale: No hurt Pain Intervention(s): Monitored during session    Home Living                      Prior Function            PT Goals (current goals can now be found in the care plan section) Acute Rehab PT Goals Patient Stated Goal: per daughters for pt to get better Time For Goal Achievement: 08/24/19 Potential to Achieve Goals: Good Progress towards PT goals: Progressing toward goals    Frequency    Min 4X/week      PT Plan Current plan remains appropriate    Co-evaluation              AM-PAC PT "6 Clicks" Mobility   Outcome Measure  Help needed turning from your back to your side while in a flat bed without using bedrails?: A Lot Help needed moving from lying on your back to sitting on the side of a flat bed without using bedrails?: A Lot Help needed moving to and from a bed to a chair (including a wheelchair)?: A Lot Help needed standing up from a chair using your arms (e.g., wheelchair or bedside chair)?: A Lot Help needed to walk in hospital room?: A Lot Help needed climbing 3-5 steps with a railing? : Total 6 Click Score: 11    End of Session   Activity Tolerance: Patient tolerated treatment well Patient left: in chair;with chair alarm set;with family/visitor present Nurse Communication: Mobility status PT Visit Diagnosis: Unsteadiness on feet (R26.81);Other symptoms and signs involving the nervous system (R29.898)     Time: 1030-1108 PT Time Calculation (min) (ACUTE ONLY): 38 min  Charges:  $Gait Training: 8-22  mins $Neuromuscular Re-education: 8-22 mins                     08/20/2019  Michelle Carne., PT Acute Rehabilitation Services [phone]  (pager) [phone]  (office)   Michelle Carrillo 08/20/2019, 1:13 PM

## 2019-08-21 ENCOUNTER — Inpatient Hospital Stay (HOSPITAL_COMMUNITY): Payer: Medicare Other

## 2019-08-21 LAB — GLUCOSE, CAPILLARY
Glucose-Capillary: 91 mg/dL (ref 70–99)
Glucose-Capillary: 95 mg/dL (ref 70–99)
Glucose-Capillary: 119 mg/dL — ABNORMAL HIGH (ref 70–99)
Glucose-Capillary: 108 mg/dL — ABNORMAL HIGH (ref 70–99)

## 2019-08-21 NOTE — Progress Notes (Signed)
STROKE TEAM PROGRESS NOTE   INTERVAL HISTORY The patient's daughter at the bedside.  They are willing to consider other non-[facility] rehab venues. . Neurologically stable.  No changes.  Vital signs stable. Her daughter is at the bedside. Vitals:   08/20/19 2102 08/21/19 0101 08/21/19 0457 08/21/19 0908  BP: 133/69 (!) 114/57 123/64 124/64  Pulse: 78 63 65 75  Resp: 18 16 18 18   Temp: 98.3 F (36.8 C) 97.6 F (36.4 C) 98 F (36.7 C) 97.8 F (36.6 C)  TempSrc: Oral Axillary Oral Oral  SpO2: 96% 98% 99% 99%  Weight:      Height:        CBC:  Recent Labs  Lab 08/18/19 0426 08/19/19 0417  WBC 7.8 9.8  HGB 10.4* 11.5*  HCT 31.7* 35.3*  MCV 95.2 94.1  PLT 315 [id number]   Basic Metabolic Panel:  Recent Labs  Lab 08/18/19 0426 08/18/19 0958 08/18/19 1632 08/19/19 0417  NA 147*   < > 139 142  K 3.9  --   --  3.5  CL 116*  --   --  104  CO2 24  --   --  25  GLUCOSE 90  --   --  101*  BUN 11  --   --  10  CREATININE 0.48  --   --  0.54  CALCIUM 8.1*  --   --  8.8*   < > = values in this interval not displayed.    IMAGING past 24 hours CT HEAD WO CONTRAST  Result Date: 08/21/2019 CLINICAL DATA:  Follow-up intracranial hemorrhage.  Lethargy. EXAM: CT HEAD WITHOUT CONTRAST TECHNIQUE: Contiguous axial images were obtained from the base of the skull through the vertex without intravenous contrast. COMPARISON:  08/16/2019 FINDINGS: Brain: Previous left frontoparietal craniotomy. Small amount of fluid underneath the craniotomy is unchanged, maximal thickness 5 mm. Intraparenchymal hematoma at the left parietal vertex is undergoing expected evolutionary change with diminishing density. There does not appear to have been any additional hemorrhage, the hematoma being approximately 3.5 cm in diameter. Vasogenic edema surrounding that region appears similar or slightly increased. There are 2 small foci newly seen hemorrhage within the brain adjacent to the main hematoma, neither more than a cm in  size. Mass effect has increased with left-to-right shift now measuring up to 12 mm, previously measured at 9 mm. No evidence of hydrocephalus. Vascular: No abnormal vascular finding. Skull: Left frontoparietal craniotomy as noted above. Sinuses/Orbits: Clear/normal Other: None IMPRESSION: Main intraparenchymal hematoma at the left parietal vertex is undergoing expected evolutionary change with diminishing density. No evidence of dish in a bleeding within the main hematoma. Two small foci of hemorrhage within the portion of the adjacent brain affected by vasogenic edema, neither larger than a cm. Vasogenic edema may be slightly increased. Slight increase in mass effect with increase in left-to-right shift from 9 mm to 12 mm measured at the level of the body of the lateral ventricles. Electronically Signed   By: Nelson Chimes M.D.   On: 08/21/2019 07:41     PHYSICAL EXAM    General - Well nourished, well developed, in no apparent distress.   Ophthalmologic - fundi not visualized due to noncooperation.  Cardiovascular - Regular rhythm and rate.  Neuro - awake alert, eyes open, global aphasia, not following central or peripheral commands but can follow only midline commands, only has "yes", "okay", not able to repeat or name. No gaze preference, able to track bilaterally, no obvious hemianopia. PERRL.  Right slight facial droop. BUE and BLE equal strength against gravity, at least 4/5. DTR 1+ and no babinski. Sensation, coordination not cooperative and gait not tested.   ASSESSMENT/PLAN Ms. ORISSA WHISONANT is a 69 y.o. female with history of HTN, anxiety, depression. Presenting to ER with AMS, n/v. She was found to have a rather large left parieta ICH and pan-hemisphere left SDH which required urgent craniotomy on 4/29.   ICH - left parietal large ICH and pan-hemisphere left SDH s/p subdural evacuation - etiology unclear, ? hypertensive   CT - moderate cortically based hematoma with extension  into the subdural space resulting in a fairly sizable subdural hematoma on the left.  MRI 08/09/19 - 4 cm left parietal parenchymal hematoma unchanged. Interval craniotomy and drainage of left subdural hematoma with mild amount of residual fluid in sub and blood in the left subdural space. Improvement in mass-effect and midline shift which now measures 4 mm to the right. No other areas of hemorrhage.  MRA 08/09/19 - unremarkable, no AVM or aneurysm  CT head 5/1 - Expected postoperative changes of left subdural and partial parietal parenchymal hematoma evacuation. Mass effect has improved with persistent rightward midline shift measuring 7 mm  CT head 5/3 - stable post op changes, residual L parietal hematoma. 83mm midline shift. Sinus dz.  CT repeat 5/5 stable MLS, unchanged from 5/3   CT repeat 5/6 stable MLS, unchanged from 5/5   CT repeat 5/12 pending   2D echo EF 60 - 65%. No cardiac source of emboli identified.   EEG neg sz    SCDs for VTE prophylaxis. No pharmaceutical prophylaxis at this time given hemorrhage   None prior to admission, now on none d/t bleeding  Therapy recommendations:  CIR   Disposition:  Pending  Looking at non-[facility] rehab facilities  Worsening aphasia  5/1 worsening aphasia -> considered cerebral edema -> 3% saline -> improved  3% saline tapered off 5/5  5/6 worsening aphasia again while working with PT -> considered low BP with labetalol -> labetalol discontinued -> IVF bolus and bed rest -> EEG neg for seizure and CT repeat stable  5/7 BP improved but aphasia did not improve -> back on 3% saline and one time mannitol  Also consider possible prolonged post ictal -> increase keppra dose to 1000 bid  Repeat EEG - cortical dysfunction in left posterior temporal region. mild to moderate diffuse encephalopathy. No seizures  Overnight EEG 08/19/2019: cortical dysfunction in lefthemisphere. No seizures ordefiniteepileptiform discharges. Improved compared  to previous EEG.  Cerebral edema  Aphasia worsened 5/1 as per daughter  Likely related to worsening cerebral edema  MRI 4/29 - MLS 26mm post procedure  CT 5/1 - MLS 84mm  CT head 5/3 - stable post op changes, residual L parietal hematoma. 47mm midline shift.   CT repeat 5/5 unchanged with stable MLS  Off 3% which was started 08/17/19  Na 147  CT repeat 5/6 unchanged hematoma and stable MIDLINE SHIFT  CT repeat 5/12 pending   Hypertension->Hypotension  Home meds:  Zestril  Off cleviprex  BP goal < 160s  BP stable . On lisinopril 20 daily, amlodipine 10, labetalol prn . Long-term BP goal normotensive  Hyperlipidemia  Home meds: none  LDL - 83   May consider low dose statin on discharge  Other Stroke Risk Factors  Advanced age  Other Active Problems  Code Status - Full code  Keppra - seizure prophylaxis - increase to 1000 bid  Anemia - Hb -  10.4  Leukocytosis - resolved  Hypokalemia - resolved  Hospital day # 12  Continue ongoing therapies.  Mobilize out of bed.  Since inpatient rehab beds are not going to be available at [facility] for several days with look at rehab beds in [facility] or [facility] if available earlier.  Patient's daughter is willing to consider moving out there if bed becomes available.  Discussed with rehab coordinator, Education officer, museum.  Greater than 50% time during this 25-minute visit was spent in counseling and coordination of care and discussion with care team  Antony Contras, [place] Pager: [phone] 08/21/2019 11:15 AM   To contact Stroke Continuity provider, please refer to [email or web address] After hours, contact General Neurology

## 2019-08-21 NOTE — Progress Notes (Signed)
Physical Therapy Treatment Patient Details Name: Michelle Carrillo MRN: [id number] DOB: 08-03-1950 Today's Date: 08/21/2019    History of Present Illness 69 y.o. female the history of hypertension who presents with altered mental status that started abruptly this evening. Pt's daughter noticed the pt had vomited and that her cognition was altered. Pt found to have acute SDH and L parietal occipital ICH. Pt underwent Left fronto-temporal- parietal CRANIOTOMY for subdural hematoma evacuation on 4/29. Pt requiring transfer to MICU on 5/7 for 3% saline solution administration.    PT Comments    Patient is making progress toward PT goals and tolerated increased gait distance well. Pt's daughter present and interpreted throughout session. Pt continues to present with slow processing and follows single step cues inconsistently. Pt requires +2 HHA for gait training and with R lateral bias. Pt will continue to benefit from further skilled PT services in both acute and post acute settings to maximize independence and safety with mobility.     Follow Up Recommendations  CIR;Supervision/Assistance - 24 hour     Equipment Recommendations  Rolling walker with 5" wheels;Other (comment)(TBA)    Recommendations for Other Services Rehab consult     Precautions / Restrictions Precautions Precautions: Fall Restrictions Weight Bearing Restrictions: No    Mobility  Bed Mobility Overal bed mobility: Needs Assistance Bed Mobility: Supine to Sit     Supine to sit: Mod assist     General bed mobility comments: cues for sequencing; assist to elevate trunk into sitting; use of rail   Transfers Overall transfer level: Needs assistance   Transfers: Sit to/from Stand Sit to Stand: Min assist;+2 safety/equipment;Mod assist         General transfer comment: assist to power up into standing and then to gain balance upon standing with +2 HHA; cues for hand placement; increased time to initiate    Ambulation/Gait Ambulation/Gait assistance: Min assist;+2 physical assistance Gait Distance (Feet): 60 Feet Assistive device: 2 person hand held assist Gait Pattern/deviations: Step-through pattern;Decreased step length - right;Decreased step length - left;Decreased stride length Gait velocity: reduced   General Gait Details: cues for increased bilat step lengths but no change noted; assist for balance; +2 HHA and daughter pushed chair for safety; last ~ 10 ft pt getting more fatigued and with R lateral lean    Stairs             Wheelchair Mobility    Modified Rankin (Stroke Patients Only) Modified Rankin (Stroke Patients Only) Pre-Morbid Rankin Score: No symptoms Modified Rankin: Moderately severe disability     Balance Overall balance assessment: Needs assistance Sitting-balance support: Single extremity supported;No upper extremity supported;Feet supported;Feet unsupported Sitting balance-Leahy Scale: Fair     Standing balance support: Bilateral upper extremity supported Standing balance-Leahy Scale: Poor                              Cognition Arousal/Alertness: Awake/alert Behavior During Therapy: WFL for tasks assessed/performed Overall Cognitive Status: Impaired/Different from baseline                         Following Commands: Follows one step commands inconsistently;Follows one step commands with increased time     Problem Solving: Slow processing;Requires tactile cues;Requires verbal cues;Decreased initiation General Comments: slow to process and at times not responding to questions (daughter interpreting); pt expresses fear of falling so hesitant to stand/ambulate      Exercises  General Comments        Pertinent Vitals/Pain Pain Assessment: No/denies pain    Home Living                      Prior Function            PT Goals (current goals can now be found in the care plan section) Progress towards  PT goals: Progressing toward goals    Frequency    Min 4X/week      PT Plan Current plan remains appropriate    Co-evaluation              AM-PAC PT "6 Clicks" Mobility   Outcome Measure  Help needed turning from your back to your side while in a flat bed without using bedrails?: A Little Help needed moving from lying on your back to sitting on the side of a flat bed without using bedrails?: A Lot Help needed moving to and from a bed to a chair (including a wheelchair)?: A Lot Help needed standing up from a chair using your arms (e.g., wheelchair or bedside chair)?: A Lot Help needed to walk in hospital room?: A Lot Help needed climbing 3-5 steps with a railing? : Total 6 Click Score: 12    End of Session Equipment Utilized During Treatment: Gait belt Activity Tolerance: Patient tolerated treatment well Patient left: in chair;with chair alarm set;with family/visitor present;with call bell/phone within reach Nurse Communication: Mobility status PT Visit Diagnosis: Unsteadiness on feet (R26.81);Other symptoms and signs involving the nervous system [id number])     Time: [id number] PT Time Calculation (min) (ACUTE ONLY): 28 min  Charges:  $Gait Training: 23-37 mins                     Michelle Carrillo, PTA Acute Rehabilitation Services Pager: [phone] Office: [phone]     Michelle Carrillo 08/21/2019, 4:10 PM

## 2019-08-21 NOTE — TOC Initial Note (Signed)
Transition of Care [facility]) - Initial/Assessment Note    Patient Details  Name: Michelle Carrillo MRN: [id number] Date of Birth: 1950-04-28  [facility] CM/SW Contact:    Pollie Friar, RN Phone Number: 08/21/2019, 2:21 PM  Clinical Narrative:                 Recommendations are for CIR. CIR doesn't have a bed available. CM met with the patient and daughter and they are agreeable to having her faxed out to [facility]. Leigh with [facility] called and spoke to the daughter and answered her questions.  CM has sent [facility] the patients information and is waiting to hear if they will be able to accept.   Expected Discharge Plan: IP Rehab Facility Barriers to Discharge: Continued Medical Work up   Patient Goals and CMS Choice   CMS Medicare.gov Compare Post Acute Care list provided to:: Patient Represenative (must comment) Choice offered to / list presented to : Adult Children  Expected Discharge Plan and Services Expected Discharge Plan: IP Rehab Facility In-house Referral: Clinical Social Work Discharge Planning Services: CM Consult Post Acute Care Choice: IP Rehab Living arrangements for the past 2 months: Single Family Home                                      Prior Living Arrangements/Services Living arrangements for the past 2 months: Single Family Home Lives with:: Spouse Patient language and need for interpreter reviewed:: Yes Do you feel safe going back to the place where you live?: Yes      Need for Family Participation in Patient Care: Yes (Comment) Care giver support system in place?: Yes (comment)   Criminal Activity/Legal Involvement Pertinent to Current Situation/Hospitalization: No - Comment as needed  Activities of Daily Living Home Assistive Devices/Equipment: Eyeglasses, CBG Meter ADL Screening (condition at time of admission) Patient's cognitive ability adequate to safely complete daily activities?: Yes Is the patient deaf or  have difficulty hearing?: No Does the patient have difficulty seeing, even when wearing glasses/contacts?: No Does the patient have difficulty concentrating, remembering, or making decisions?: Yes Patient able to express need for assistance with ADLs?: Yes Does the patient have difficulty dressing or bathing?: No Independently performs ADLs?: Yes (appropriate for developmental age) Does the patient have difficulty walking or climbing stairs?: No Weakness of Legs: Right Weakness of Arms/Hands: None  Permission Sought/Granted                  Emotional Assessment Appearance:: Appears stated age         Psych Involvement: No (comment)  Admission diagnosis:  Intracranial hemorrhage (HCC) [I62.9] ICH (intracerebral hemorrhage) ([place]) [I61.9] Subdural hematoma, acute ([place]) [S06.5X9A] Patient Active Problem List   Diagnosis Date Noted  . Cerebral edema (HCC)   . S/P craniotomy   . Intracranial hemorrhage ([place])   . Anxiety and depression   . Leukocytosis   . ICH (intracerebral hemorrhage) ([place]) 08/09/2019  . Subdural hematoma, acute ([place]) 08/09/2019  . Family history of uterine cancer 06/20/2019  . Healthcare maintenance 06/20/2019  . History of strabismus surgery 03/31/2019  . Left foot lesion 07/07/2018  . Depression 06/06/2018  . Right shoulder pain 06/06/2018  . 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 Osteoarthritis of right knee 06/05/2018  . Essential hypertension 06/05/2018  . Anxiety 06/05/2018  . Age-related nuclear  cataract of both eyes 11/26/2014  . Diplopia 11/26/2014  . Monocular esotropia of left eye 11/26/2014  . H/O herpes zoster 01/04/2014  . Corneal transplant failure 10/27/2011   PCP:  Welford Roche, MD Pharmacy:   [facility] [place] [place] - [address] [facility] [place] [address] Phone: [phone] Fax: [phone]     Social Determinants  of Health (SDOH) Interventions    Readmission Risk Interventions No flowsheet data found.

## 2019-08-21 NOTE — Progress Notes (Signed)
Patient ID: Michelle Carrillo, female   DOB: 04/15/1950, 69 y.o.   MRN: [id number] BP (!) 110/57 (BP Location: Right Arm)   Pulse 68   Temp 98 F (36.7 C) (Oral)   Resp 18   Ht 5' (1.524 m)   Wt 83.5 kg   SpO2 100%   BMI 35.95 kg/m  Alert, conversant Moving all extremities Wound is clean, and dry. Will have staples removed Stable. Head CT is slightly worse with increased edema and midline shift. Would have very low bar for transfer if neuro worsens. Currently wide awake.

## 2019-08-21 NOTE — Progress Notes (Signed)
  Speech Language Pathology Treatment: Cognitive-Linquistic  Patient Details Name: Michelle Carrillo MRN: [id number] DOB: [birth year] Today's Date: 08/21/2019 Time: 0930-1000 SLP Time Calculation (min) (ACUTE ONLY): 30 min  Assessment / Plan / Recommendation Clinical Impression  Pt alert, participated in prolonged session and was highly stimulable with therapeutic interventions. Pt able to point to single words in a field of two correctly times three, sang a familiar song with moments of accurate articulation, repeated single words x4 with max visual and phonemic cueing, correctly named daughter and herself x2 each. Her aphasia increased in severity from my last session, but she is still engaged with therapist and able to make improvement with interventions. Discussed methods to elicit language with daughter Michelle Carrillo. Will continue efforts.    HPI HPI: 69 year old female with cortically based hematoma with subdural extension. 3.9 x 3.7 x 3.2 cm (estimated volume 23 cc) acute intraparenchymal hemorrhage centered at the left parietal convexity with mild surrounding vasogenic edema.Went to OR for urgent craniotomy and subdural hematoma evacuation on 4/29.       SLP Plan  Continue with current plan of care       Recommendations                   General recommendations: Rehab consult Follow up Recommendations: Inpatient Rehab SLP Visit Diagnosis: Dysphagia, unspecified (R13.10) Plan: Continue with current plan of care       GO               Herbie Baltimore, MA [place] Pager [phone] Office [phone]  Lynann Beaver 08/21/2019, 11:04 AM

## 2019-08-22 LAB — GLUCOSE, CAPILLARY
Glucose-Capillary: 97 mg/dL (ref 70–99)
Glucose-Capillary: 143 mg/dL — ABNORMAL HIGH (ref 70–99)
Glucose-Capillary: 115 mg/dL — ABNORMAL HIGH (ref 70–99)

## 2019-08-22 MED ORDER — ENSURE ENLIVE PO LIQD
237.0000 mL | Freq: Three times a day (TID) | ORAL | Status: DC
  Administered 2019-08-22 – 2019-08-23 (×2): 237 mL via ORAL

## 2019-08-22 MED ORDER — ADULT MULTIVITAMIN W/MINERALS CH
1.0000 | ORAL_TABLET | Freq: Every day | ORAL | Status: DC
  Administered 2019-08-22 – 2019-08-23 (×2): 1 via ORAL
  Filled 2019-08-22 (×2): qty 1

## 2019-08-22 NOTE — Progress Notes (Signed)
Inpatient Rehab Admissions Coordinator:   I have no beds available on CIR today.  Note pt agreeable to looking at other AIR beds.  Will continue to follow and plan for possible admission as soon as bed becomes available if pt remains in house.    Shann Medal, PT, DPT Admissions Coordinator [phone] 08/22/19  10:33 AM

## 2019-08-22 NOTE — Discharge Summary (Addendum)
Stroke Discharge Summary  Patient ID: Michelle Carrillo   MRN: [id number]      DOB: Dec 01, 1950  Date of Admission: 08/09/2019 Date of Discharge: 08/23/2019  Attending Physician:  Garvin Fila, MD, Stroke MD Consultant(s):  Ashok Pall MD (neurosurgery), Alysia Penna, MD (Physical Medicine & Rehabilitation)  Patient's PCP:  Welford Roche, MD  Discharge Diagnoses:  Principal Problem:   ICH (intracerebral hemorrhage) ([place]) L parietal w/ L SDH s/p crani Active Problems:   Essential hypertension   Subdural hematoma, acute ([place])   Anxiety and depression   Leukocytosis   Cerebral edema ([facility])   S/P craniotomy   Aphasia due to acute stroke ([place])   Hyperlipemia   Medications to be continued on Rehab Allergies as of 08/23/2019   No Known Allergies      Medication List     STOP taking these medications    cetirizine 10 MG tablet Commonly known as: ZYRTEC   Glucosamine Sulfate 1000 MG Caps   meloxicam 15 MG tablet Commonly known as: MOBIC       TAKE these medications    amLODipine 10 MG tablet Commonly known as: NORVASC Take 1 tablet (10 mg total) by mouth daily. Start taking on: Aug 24, 2019   feeding supplement (ENSURE ENLIVE) Liqd Take 237 mLs by mouth 3 (three) times daily between meals.   HYDROcodone-acetaminophen 5-325 MG tablet Commonly known as: NORCO/VICODIN Take 1 tablet by mouth every 4 (four) hours as needed for moderate pain.   levETIRAcetam 500 MG tablet Commonly known as: KEPPRA Take 1 tablet (500 mg total) by mouth 2 (two) times daily.   lisinopril 20 MG tablet Commonly known as: ZESTRIL Take 1 tablet by mouth once daily   multivitamin with minerals Tabs tablet Take 1 tablet by mouth daily. Start taking on: Aug 24, 2019   PARoxetine 20 MG tablet Commonly known as: PAXIL Take 1 tablet by mouth daily   prednisoLONE acetate 1 % ophthalmic suspension Commonly known as: PRED FORTE Place 1 drop into the left eye 2  (two) times daily.   senna-docusate 8.6-50 MG tablet Commonly known as: Senokot-S Take 1 tablet by mouth 2 (two) times daily.        LABORATORY STUDIES CBC    Component Value Date/Time   WBC 9.8 08/19/2019 0417   RBC 3.75 (L) 08/19/2019 0417   HGB 11.5 (L) 08/19/2019 0417   HCT 35.3 (L) 08/19/2019 0417   PLT 336 08/19/2019 0417   MCV 94.1 08/19/2019 0417   MCH 30.7 08/19/2019 0417   MCHC 32.6 08/19/2019 0417   RDW 13.2 08/19/2019 0417   LYMPHSABS 1.7 08/09/2019 0247   MONOABS 0.5 08/09/2019 0247   EOSABS 0.1 08/09/2019 0247   BASOSABS 0.0 08/09/2019 0247   CMP    Component Value Date/Time   NA 142 08/19/2019 0417   NA 140 03/27/2019 0933   K 3.5 08/19/2019 0417   CL 104 08/19/2019 0417   CO2 25 08/19/2019 0417   GLUCOSE 101 (H) 08/19/2019 0417   BUN 10 08/19/2019 0417   BUN 11 03/27/2019 0933   CREATININE 0.54 08/19/2019 0417   CALCIUM 8.8 (L) 08/19/2019 0417   PROT 7.4 08/09/2019 0247   ALBUMIN 3.5 08/09/2019 0247   AST 28 08/09/2019 0247   ALT 24 08/09/2019 0247   ALKPHOS 130 (H) 08/09/2019 0247   BILITOT 0.6 08/09/2019 0247   GFRNONAA >60 08/19/2019 0417   GFRAA >60 08/19/2019 0417   COAGS Lab Results  Component Value Date   INR 1.0 08/09/2019   Lipid Panel    Component Value Date/Time   CHOL 148 08/11/2019 0300   TRIG 83 08/11/2019 0300   HDL 48 08/11/2019 0300   CHOLHDL 3.1 08/11/2019 0300   VLDL 17 08/11/2019 0300   LDLCALC 83 08/11/2019 0300   HgbA1C  Lab Results  Component Value Date   HGBA1C 5.7 (H) 08/09/2019   Urinalysis    Component Value Date/Time   COLORURINE YELLOW 08/17/2019 1632   APPEARANCEUR CLEAR 08/17/2019 1632   LABSPEC 1.019 08/17/2019 1632   PHURINE 7.0 08/17/2019 1632   GLUCOSEU NEGATIVE 08/17/2019 1632   HGBUR SMALL (A) 08/17/2019 1632   BILIRUBINUR NEGATIVE 08/17/2019 1632   KETONESUR NEGATIVE 08/17/2019 1632   PROTEINUR 100 (A) 08/17/2019 1632   UROBILINOGEN 0.2 10/15/2014 0024   NITRITE NEGATIVE 08/17/2019  1632   LEUKOCYTESUR NEGATIVE 08/17/2019 1632   Urine Drug Screen     Component Value Date/Time   LABOPIA NONE DETECTED 08/09/2019 1118   COCAINSCRNUR NONE DETECTED 08/09/2019 1118   LABBENZ NONE DETECTED 08/09/2019 1118   AMPHETMU NONE DETECTED 08/09/2019 1118   THCU NONE DETECTED 08/09/2019 1118   LABBARB NONE DETECTED 08/09/2019 1118    Alcohol Level    Component Value Date/Time   ETH <10 08/09/2019 0247    SIGNIFICANT DIAGNOSTIC STUDIES CT HEAD WO CONTRAST  Result Date: 08/23/2019 CLINICAL DATA:  Stroke follow-up EXAM: CT HEAD WITHOUT CONTRAST TECHNIQUE: Contiguous axial images were obtained from the base of the skull through the vertex without intravenous contrast. COMPARISON:  Two days ago FINDINGS: Brain: Hazy hematoma centered at the left parietal lobe without interval increase, high-density clot measuring up to 3.3 cm. There is a similar degree of adjacent vasogenic edema and trace pneumocephalus. Subdural collection along the left cerebral convexity which is now mainly low-density. Midline shift measures 9 mm, improved. No entrapment or acute infarct. Tiny high-density at the region of the foramen of [place] is calcified choroid by reformats. Vascular: No acute finding Skull: Unremarkable appearance of the left-sided craniotomy. Sinuses/Orbits: Negative IMPRESSION: No progressive intracranial hemorrhage or swelling. Midline shift has decreased from 11 to 8 mm. Electronically Signed   By: Monte Fantasia M.D.   On: 08/23/2019 08:04   CT HEAD WO CONTRAST  Result Date: 08/21/2019 CLINICAL DATA:  Follow-up intracranial hemorrhage.  Lethargy. EXAM: CT HEAD WITHOUT CONTRAST TECHNIQUE: Contiguous axial images were obtained from the base of the skull through the vertex without intravenous contrast. COMPARISON:  08/16/2019 FINDINGS: Brain: Previous left frontoparietal craniotomy. Small amount of fluid underneath the craniotomy is unchanged, maximal thickness 5 mm. Intraparenchymal hematoma  at the left parietal vertex is undergoing expected evolutionary change with diminishing density. There does not appear to have been any additional hemorrhage, the hematoma being approximately 3.5 cm in diameter. Vasogenic edema surrounding that region appears similar or slightly increased. There are 2 small foci newly seen hemorrhage within the brain adjacent to the main hematoma, neither more than a cm in size. Mass effect has increased with left-to-right shift now measuring up to 12 mm, previously measured at 9 mm. No evidence of hydrocephalus. Vascular: No abnormal vascular finding. Skull: Left frontoparietal craniotomy as noted above. Sinuses/Orbits: Clear/normal Other: None IMPRESSION: Main intraparenchymal hematoma at the left parietal vertex is undergoing expected evolutionary change with diminishing density. No evidence of dish in a bleeding within the main hematoma. Two small foci of hemorrhage within the portion of the adjacent brain affected by vasogenic edema, neither larger than  a cm. Vasogenic edema may be slightly increased. Slight increase in mass effect with increase in left-to-right shift from 9 mm to 12 mm measured at the level of the body of the lateral ventricles. Electronically Signed   By: Nelson Chimes M.D.   On: 08/21/2019 07:41   CT HEAD WO CONTRAST  Result Date: 08/16/2019 CLINICAL DATA:  Intracranial hemorrhage, follow-up EXAM: CT HEAD WITHOUT CONTRAST TECHNIQUE: Contiguous axial images were obtained from the base of the skull through the vertex without intravenous contrast. COMPARISON:  08/15/2019 FINDINGS: Brain: Left parietal parenchymal hemorrhage is similar in size. Surrounding edema is also similar with adjacent sulcal and ventricular effacement. Residual mixed density extra-axial hematoma along the left cerebral convexity remain stable. There is no new hemorrhage. Rightward midline shift measures similar at 6 mm (7 mm). No hydrocephalus. There is no new loss of gray-white  differentiation. Vascular: No new findings. Skull: Postoperative changes of left frontoparietal craniotomy. Sinuses/Orbits: No acute finding. Other: None. IMPRESSION: No substantial change in residual left parietal parenchymal hemorrhage and associated edema. Residual mixed density extra-axial hematoma along the left cerebral convexity is also unchanged. There is no new hemorrhage or worsening mass effect. Electronically Signed   By: Macy Mis M.D.   On: 08/16/2019 19:25   CT HEAD WO CONTRAST  Result Date: 08/15/2019 CLINICAL DATA:  Cerebral hemorrhage suspected.  Follow-up. EXAM: CT HEAD WITHOUT CONTRAST TECHNIQUE: Contiguous axial images were obtained from the base of the skull through the vertex without intravenous contrast. COMPARISON:  Prior head CT examinations 08/13/2019 and earlier. FINDINGS: Brain: Evolving postoperative changes related to prior left frontoparietal craniotomy and subdural/parenchymal hematoma evacuation. Unchanged size of a residual hematoma centered within the left parietal lobe as compared to prior exam 08/13/2019. This again measures 3.4 x 2.7 cm in transaxial dimensions. Surrounding parenchymal edema has also not significantly changed. Also unchanged from this prior examination, there is extra-axial fluid and blood products as well as pneumocephalus overlying the left cerebral convexity with this collection measuring up to 6 mm in thickness. Stable mass effect with persistent 7 mm rightward midline shift with continued partial effacement of the left lateral ventricle. No evidence of ventricular entrapment. No interval intracranial abnormality is identified. Vascular: No hyperdense vessel.  Atherosclerotic calcifications. Skull: Left-sided cranioplasty.  Otherwise unremarkable. Sinuses/Orbits: Visualized orbits show no acute finding. No significant paranasal sinus disease or mastoid effusion at the imaged levels. Other: Left-sided scalp staples. IMPRESSION: 1. No significant  interval change as compared to head CT 08/13/2019. 2. Postop changes from prior left parietal parenchymal and left subdural hematoma evacuation. 3. Unchanged residual hematoma within the left parietal lobe with stable surrounding parenchymal edema. 4. Stable left extra-axial fluid, blood products and pneumocephalus. 5. Stable mass effect with 7 mm rightward midline shift. Electronically Signed   By: Kellie Simmering DO   On: 08/15/2019 14:20   CT HEAD WO CONTRAST  Result Date: 08/13/2019 CLINICAL DATA:  Cerebral hemorrhage suspected. Additional history provided: Cerebral hemorrhage, craniotomy. EXAM: CT HEAD WITHOUT CONTRAST TECHNIQUE: Contiguous axial images were obtained from the base of the skull through the vertex without intravenous contrast. COMPARISON:  Head CT 08/11/2019, brain MRI 08/09/2019, head CT 08/09/2019 FINDINGS: Brain: Postoperative changes related to previous left frontoparietal craniotomy and subdural/parenchymal hematoma evacuation. The residual hematoma centered within the left parietal lobe has not significantly changed, again measuring 3.4 x 2.7 cm in transaxial dimensions. Unchanged surrounding edema. Also unchanged from this prior examination, there is extra-axial fluid and blood products overlying the left cerebral  convexity measuring up to 6 mm in thickness. Unchanged mass effect with 7 mm rightward midline shift. Continued partial effacement of the left lateral ventricle without evidence of ventricular entrapment. Persistent small volume pneumocephalus deep to a left-sided cranioplasty. No interval intracranial abnormality is identified. Vascular: No new finding. Skull: Left-sided cranioplasty.  Otherwise unremarkable. Sinuses/Orbits: Visualized orbits show no acute finding. Left maxillary sinus mucous retention cyst. Mild scattered paranasal sinus mucosal thickening. Small air-fluid levels within the sphenoid sinuses. No significant mastoid effusion. IMPRESSION: 1. No significant  interval change as compared to head CT 08/11/2019. 2. Postoperative changes from left parietal parenchymal and left subdural hematoma evacuation. Unchanged residual hematoma within the left parietal lobe. Stable extra-axial fluid and blood products overlying the left cerebral convexity. 3. Stable mass effect with 7 mm rightward midline shift. 4. Paranasal sinus disease as described. Electronically Signed   By: Kellie Simmering DO   On: 08/13/2019 08:38   CT HEAD WO CONTRAST  Result Date: 08/11/2019 CLINICAL DATA:  Intracranial hemorrhage, follow-up EXAM: CT HEAD WITHOUT CONTRAST TECHNIQUE: Contiguous axial images were obtained from the base of the skull through the vertex without intravenous contrast. COMPARISON:  08/09/2018 FINDINGS: Brain: There are postoperative changes of interval left frontoparietal craniotomy for subdural and parenchymal hematoma evacuation. There is now extra-axial blood, fluid, and blood products along the left cerebral convexity measuring up to 6 mm in thickness (previously 9 mm). Partial evacuation of parenchymal hemorrhage centered in the parietal lobe with air along the operative tract. Residual area of hyperdense hemorrhage measures approximately 3.4 x 2.7 cm (previously 3.9 x 3.7 cm). Surrounding edema is slightly increased Mass effect has improved with persistent rightward midline shift measuring 7 mm (previously 11 mm). There is no evidence of ventricle trapping. Gray-white differentiation is preserved. Vascular: No new finding. Skull: As above.  Otherwise unremarkable. Sinuses/Orbits: No acute finding. Other: None. IMPRESSION: Expected postoperative changes of left subdural and partial parietal parenchymal hematoma evacuation. Improved mass effect with residual subcentimeter midline shift. Electronically Signed   By: Macy Mis M.D.   On: 08/11/2019 08:52   MR ANGIO HEAD WO CONTRAST  Result Date: 08/09/2019 CLINICAL DATA:  Postop craniotomy for subdural evacuation. EXAM: MRI  HEAD WITHOUT CONTRAST MRA HEAD WITHOUT CONTRAST TECHNIQUE: Multiplanar, multiecho pulse sequences of the brain and surrounding structures were obtained without intravenous contrast. Angiographic images of the head were obtained using MRA technique without contrast. COMPARISON:  CT head 08/09/2019 FINDINGS: MRI HEAD FINDINGS Brain: Interval left hemisphere craniotomy. 4 cm left parietal parenchymal hematoma is unchanged from the prior CT. Interval evacuation of most of the left-sided subdural hematoma. Residual blood in fluid in the left subdural space measuring approximately 4 mm in thickness. No other areas of intracranial hemorrhage. Mass-effect on the left cerebral hemisphere with inferior displacement of the left lateral ventricle. Interval improvement in midline shift now measuring approximately 4 mm. No acute or chronic infarct. Small amount of subarachnoid hemorrhage in the left frontal lobe. Vascular: Normal arterial flow voids Skull and upper cervical spine: Left hemispheric craniotomy. No focal skeletal lesion Sinuses/Orbits: Mucosal edema paranasal sinuses. Negative orbit. Other: None MRA HEAD FINDINGS Motion degraded study. Internal carotid artery widely patent. Mild irregularity in the cavernous carotid bilaterally due to atherosclerotic disease without significant stenosis. Anterior and middle cerebral arteries patent bilaterally. Mild atherosclerotic disease in the anterior middle cerebral arteries without flow limiting stenosis or large vessel occlusion. Both vertebral arteries patent to the basilar. PICA patent bilaterally. Basilar widely patent. Superior cerebellar and posterior  cerebral arteries patent bilaterally. Negative for aneurysm or vascular malformation. IMPRESSION: 1. 4 cm left parietal parenchymal hematoma unchanged 2. Interval craniotomy and drainage of left subdural hematoma with mild amount of residual fluid in sub and blood in the left subdural space. Improvement in mass-effect and  midline shift which now measures 4 mm to the right. No other areas of hemorrhage. 3. MRA head demonstrates mild intracranial atherosclerotic disease. No vascular malformation. Motion degraded MRA. Electronically Signed   By: Franchot Gallo M.D.   On: 08/09/2019 16:46   MR BRAIN WO CONTRAST  Result Date: 08/09/2019 CLINICAL DATA:  Postop craniotomy for subdural evacuation. EXAM: MRI HEAD WITHOUT CONTRAST MRA HEAD WITHOUT CONTRAST TECHNIQUE: Multiplanar, multiecho pulse sequences of the brain and surrounding structures were obtained without intravenous contrast. Angiographic images of the head were obtained using MRA technique without contrast. COMPARISON:  CT head 08/09/2019 FINDINGS: MRI HEAD FINDINGS Brain: Interval left hemisphere craniotomy. 4 cm left parietal parenchymal hematoma is unchanged from the prior CT. Interval evacuation of most of the left-sided subdural hematoma. Residual blood in fluid in the left subdural space measuring approximately 4 mm in thickness. No other areas of intracranial hemorrhage. Mass-effect on the left cerebral hemisphere with inferior displacement of the left lateral ventricle. Interval improvement in midline shift now measuring approximately 4 mm. No acute or chronic infarct. Small amount of subarachnoid hemorrhage in the left frontal lobe. Vascular: Normal arterial flow voids Skull and upper cervical spine: Left hemispheric craniotomy. No focal skeletal lesion Sinuses/Orbits: Mucosal edema paranasal sinuses. Negative orbit. Other: None MRA HEAD FINDINGS Motion degraded study. Internal carotid artery widely patent. Mild irregularity in the cavernous carotid bilaterally due to atherosclerotic disease without significant stenosis. Anterior and middle cerebral arteries patent bilaterally. Mild atherosclerotic disease in the anterior middle cerebral arteries without flow limiting stenosis or large vessel occlusion. Both vertebral arteries patent to the basilar. PICA patent  bilaterally. Basilar widely patent. Superior cerebellar and posterior cerebral arteries patent bilaterally. Negative for aneurysm or vascular malformation. IMPRESSION: 1. 4 cm left parietal parenchymal hematoma unchanged 2. Interval craniotomy and drainage of left subdural hematoma with mild amount of residual fluid in sub and blood in the left subdural space. Improvement in mass-effect and midline shift which now measures 4 mm to the right. No other areas of hemorrhage. 3. MRA head demonstrates mild intracranial atherosclerotic disease. No vascular malformation. Motion degraded MRA. Electronically Signed   By: Franchot Gallo M.D.   On: 08/09/2019 16:46   EEG adult  Result Date: 08/18/2019 Lora Havens, MD     08/18/2019 11:34 AM Patient Name: Michelle Carrillo MRN: [id number] Epilepsy Attending: Lora Havens Referring Physician/Provider: Dr. Rosalin Hawking Date: 08/18/2019 Duration: 25.39 minutes   Patient history: 69 year old female with left parietal hemorrhage had an episode of transient speech disturbance.  EEG evaluate for seizures.   Level of alertness: Awake   AEDs during EEG study: Keppra   Technical aspects: This EEG study was done with scalp electrodes positioned according to the 10-20 International system of electrode placement. Electrical activity was acquired at a sampling rate of 500Hz  and reviewed with a high frequency filter of 70Hz  and a low frequency filter of 1Hz . EEG data were recorded continuously and digitally stored.   Description: The posterior dominant rhythm consists of 9-10 Hz activity of moderate voltage (25-35 uV) seen predominantly in posterior head regions, asymmetric ( left<right) and reactive to eye opening and eye closing. EEG also showed continuous rhythmic 3-5hz  theta-delta slowing in left  hemisphere. Hyperventilation and photic stimulation were not performed.   Abnormality - Continuous rhythmic slow, left hemisphere - Background asymmetry, left<right   IMPRESSION: This  study is suggestive of cortical dysfunction in left hemisphere likely secondary to underlying hemorrhage. No seizures or definite epileptiform discharges were seen throughout the recording. Of note, lateralized rhythmic delta activity ( LRDA) is also on the ictal-interictal continuum and therefore recommend clinical correlation if concern for ictal activity persists. Lora Havens   EEG adult  Result Date: 08/17/2019 Lora Havens, MD     08/17/2019 10:16 AM Patient Name: Michelle Carrillo MRN: [id number] Epilepsy Attending: Lora Havens Referring Physician/Provider: Dr. Rosalin Hawking Date: 08/16/2019 Duration: 23.31 minutes Patient history: 69 year old female with left parietal hemorrhage had an episode of transient speech disturbance.  EEG evaluate for seizures. Level of alertness: Awake AEDs during EEG study: Keppra Technical aspects: This EEG study was done with scalp electrodes positioned according to the 10-20 International system of electrode placement. Electrical activity was acquired at a sampling rate of 500Hz  and reviewed with a high frequency filter of 70Hz  and a low frequency filter of 1Hz . EEG data were recorded continuously and digitally stored. Description: During awake state, no clear posterior on rhythm was seen.  EEG showed generalized polymorphic 9 to 10 Hz alpha activity.  EEG also showed continuous left posterior temporal 3 to 5 Hz theta-delta slowing.  Hyperventilation and photic stimulation were not performed. Abnormality -Continuous slow, left posterior temporal region IMPRESSION: This study is suggestive of cortical dysfunction in left posterior temporal region likely secondary to underlying hemorrhage.  Additionally, there is evidence of mild to moderate diffuse encephalopathy, nonspecific etiology.  No seizures or definite epileptiform discharges were seen throughout the recording. Priyanka [name]   Overnight EEG with video  Result Date: 08/19/2019 Lora Havens, MD      08/20/2019  9:02 AM Patient Name: Michelle Carrillo  MRN: [id number]  Epilepsy Attending: Lora Havens  Referring Physician/Provider: Mikey Bussing, NP Duration: 08/18/2019 1605 to 08/19/2019 1605   Patient history: 69 year old female with left parietal hemorrhage had an episode of transient speech disturbance.  EEG evaluate for seizures.   Level of alertness: Awake, asleep   AEDs during EEG study: Keppra   Technical aspects: This EEG study was done with scalp electrodes positioned according to the 10-20 International system of electrode placement. Electrical activity was acquired at a sampling rate of 500Hz  and reviewed with a high frequency filter of 70Hz  and a low frequency filter of 1Hz . EEG data were recorded continuously and digitally stored.    Description: The posterior dominant rhythm consists of 9-10 Hz activity of moderate voltage (25-35 uV) seen predominantly in posterior head regions, asymmetric ( left<right) and reactive to eye opening and eye closing. Sleep was characterized by  Vertex waves, sleep spindles 912-14hz ), maximal frontocentral region. EEG also showed continuous 3-5hz  theta-delta slowing in left hemisphere. Hyperventilation and photic stimulation were not performed.   Abnormality - Continuous slow, left hemisphere - Background asymmetry, left<right   IMPRESSION: This study is suggestive of cortical dysfunction in left hemisphere likely secondary to underlying hemorrhage. No seizures or definite epileptiform discharges were seen throughout the recording.   EEG appears to be improving compared to previous day.   Lora Havens   ECHOCARDIOGRAM COMPLETE  Result Date: 08/11/2019    ECHOCARDIOGRAM REPORT   Patient Name:   Michelle Carrillo Date of Exam: 08/11/2019 Medical Rec #:  [id number]  Height:       60.0 in Accession #:    [id number]             Weight:       184.1 lb Date of Birth:  Jul 18, 1950              BSA:          1.802 m Patient Age:    20 years                BP:           153/69 mmHg Patient Gender: F                      HR:           75 bpm. Exam Location:  Inpatient Procedure: 2D Echo, Cardiac Doppler and Color Doppler Indications:    Stroke 434.91/I163.9  History:        Patient has no prior history of Echocardiogram examinations.                 Risk Factors:Hypertension.  Sonographer:    Clayton Lefort RDCS (AE) Referring Phys: [id number] [facility]  Sonographer Comments: Suboptimal subcostal window. IMPRESSIONS  1. Normal LV systolic function; grade 1 diastolic dysfunction; mild MR; mild to moderate TR.  2. Left ventricular ejection fraction, by estimation, is 60 to 65%. The left ventricle has normal function. The left ventricle has no regional wall motion abnormalities. Left ventricular diastolic parameters are consistent with Grade I diastolic dysfunction (impaired relaxation).  3. Right ventricular systolic function is normal. The right ventricular size is normal.  4. The mitral valve is normal in structure. Mild mitral valve regurgitation. No evidence of mitral stenosis.  5. Tricuspid valve regurgitation is mild to moderate.  6. The aortic valve is tricuspid. Aortic valve regurgitation is not visualized. No aortic stenosis is present.  7. The inferior vena cava is normal in size with greater than 50% respiratory variability, suggesting right atrial pressure of 3 mmHg. FINDINGS  Left Ventricle: Left ventricular ejection fraction, by estimation, is 60 to 65%. The left ventricle has normal function. The left ventricle has no regional wall motion abnormalities. The left ventricular internal cavity size was normal in size. There is  no left ventricular hypertrophy. Left ventricular diastolic parameters are consistent with Grade I diastolic dysfunction (impaired relaxation). Right Ventricle: The right ventricular size is normal.Right ventricular systolic function is normal. Left Atrium: Left atrial size was normal in size. Right Atrium: Right atrial size was normal in  size. Pericardium: There is no evidence of pericardial effusion. Mitral Valve: The mitral valve is normal in structure. Normal mobility of the mitral valve leaflets. Mild mitral valve regurgitation. No evidence of mitral valve stenosis. MV peak gradient, 7.8 mmHg. The mean mitral valve gradient is 3.0 mmHg. Tricuspid Valve: The tricuspid valve is normal in structure. Tricuspid valve regurgitation is mild to moderate. No evidence of tricuspid stenosis. Aortic Valve: The aortic valve is tricuspid. Aortic valve regurgitation is not visualized. No aortic stenosis is present. Aortic valve mean gradient measures 5.0 mmHg. Aortic valve peak gradient measures 8.5 mmHg. Aortic valve area, by VTI measures 1.77 cm. Pulmonic Valve: The pulmonic valve was normal in structure. Pulmonic valve regurgitation is not visualized. No evidence of pulmonic stenosis. Aorta: The aortic root is normal in size and structure. Venous: The inferior vena cava is normal in size with greater than 50% respiratory variability, suggesting right atrial pressure of 3  mmHg. IAS/Shunts: No atrial level shunt detected by color flow Doppler. Additional Comments: Normal LV systolic function; grade 1 diastolic dysfunction; mild MR; mild to moderate TR.  LEFT VENTRICLE PLAX 2D LVIDd:         4.40 cm  Diastology LVIDs:         3.00 cm  LV e' lateral:   9.90 cm/s LV PW:         1.00 cm  LV E/e' lateral: 11.4 LV IVS:        1.00 cm  LV e' medial:    6.53 cm/s LVOT diam:     1.90 cm  LV E/e' medial:  17.3 LV SV:         59 LV SV Index:   33 LVOT Area:     2.84 cm  RIGHT VENTRICLE             IVC RV Basal diam:  2.70 cm     IVC diam: 1.90 cm RV S prime:     13.80 cm/s TAPSE (M-mode): 2.6 cm LEFT ATRIUM             Index       RIGHT ATRIUM           Index LA diam:        3.40 cm 1.89 cm/m  RA Area:     12.10 cm LA Vol (A2C):   64.3 ml 35.68 ml/m RA Volume:   24.30 ml  13.49 ml/m LA Vol (A4C):   38.2 ml 21.20 ml/m LA Biplane Vol: 52.3 ml 29.02 ml/m  AORTIC  VALVE AV Area (Vmax):    1.94 cm AV Area (Vmean):   1.65 cm AV Area (VTI):     1.77 cm AV Vmax:           146.00 cm/s AV Vmean:          107.000 cm/s AV VTI:            0.334 m AV Peak Grad:      8.5 mmHg AV Mean Grad:      5.0 mmHg LVOT Vmax:         99.90 cm/s LVOT Vmean:        62.100 cm/s LVOT VTI:          0.209 m LVOT/AV VTI ratio: 0.63  AORTA Ao Root diam: 3.00 cm Ao Asc diam:  3.10 cm MITRAL VALVE                TRICUSPID VALVE MV Area (PHT): 3.27 cm     TR Peak grad:   43.0 mmHg MV Peak grad:  7.8 mmHg     TR Vmax:        328.00 cm/s MV Mean grad:  3.0 mmHg MV Vmax:       1.40 m/s     SHUNTS MV Vmean:      80.0 cm/s    Systemic VTI:  0.21 m MV Decel Time: 232 msec     Systemic Diam: 1.90 cm MV E velocity: 113.00 cm/s MV A velocity: 144.00 cm/s MV E/A ratio:  0.78 Kirk Ruths MD Electronically signed by Kirk Ruths MD Signature Date/Time: 08/11/2019/1:03:37 PM    Final    CT HEAD CODE STROKE WO CONTRAST  Result Date: 08/09/2019 CLINICAL DATA:  Code stroke. Initial evaluation for acute altered mental status. EXAM: CT HEAD WITHOUT CONTRAST TECHNIQUE: Contiguous axial images were obtained from the base of the skull through the vertex  without intravenous contrast. COMPARISON:  None. FINDINGS: Brain: Acute intraparenchymal hemorrhage centered at the left parietal lobe measures 3.9 x 3.7 x 3.2 cm (estimated volume 23 cc). Mild surrounding vasogenic edema. Trace adjacent subarachnoid blood. Associated subdural extension with a left subdural hematoma seen overlying the left cerebral convexity. Subdural collection measures up to 9 mm in maximal thickness. Associated mass effect with left-to-right midline shift measuring up to 11 mm. Left lateral ventricle partially effaced with asymmetric dilatation of the right lateral ventricle, concerning for early trapping. No visible transependymal flow of CSF. Mild basilar cistern crowding without transtentorial herniation. No other acute intracranial hemorrhage. No  other acute large vessel territory infarct. No appreciable mass lesion. Vascular: No hyperdense vessel. Scattered vascular calcifications noted within the carotid siphons. Skull: Scalp soft tissues and calvarium within normal limits. Sinuses/Orbits: Globes and orbital soft tissues demonstrate no acute finding. Left gaze noted. Scattered mucosal thickening noted throughout the paranasal sinuses. Left maxillary sinus retention cyst no mastoid effusion. Other: None. IMPRESSION: 1. 3.9 x 3.7 x 3.2 cm (estimated volume 23 cc) acute intraparenchymal hemorrhage centered at the left parietal convexity with mild surrounding vasogenic edema. 2. Associated subdural extension with 9 mm subdural hematoma overlying the left cerebral convexity. Associated mass effect with up to 11 mm left-to-right midline shift. 3. Asymmetric dilatation of the right lateral ventricle, concerning for early ventricular trapping. No visible transependymal flow of CSF. 4. ASPECTS: Does not apply given ICH. These results were communicated to Dr. Leonel Ramsay at 3:11 amon 4/29/2021by text page via the [facility] messaging system. Electronically Signed   By: Jeannine Boga M.D.   On: 08/09/2019 03:13       HISTORY OF PRESENT ILLNESS Michelle Carrillo is a 69 y.o. female the history of hypertension who presents with altered mental status that started abruptly this evening.  She was last known well around 1 AM on 08/09/2019 at which time she was speaking normally.  She went to her room at that time. Subsequently, as her daughter was going to bed, she noticed that there was vomit on the patient.  She went into check on her and found her to be quite confused.Of note, the patient is Spanish only speaking. Modified Rankin Scale: 0. NIHSS: 6. Imaging showed a large L ICH.   HOSPITAL COURSE Michelle Carrillo is a 69 y.o. female with history of HTN, anxiety, depression. Presenting to ER with AMS, n/v. She was found to have a rather large left  parieta ICH and pan-hemisphere left SDH which required urgent craniotomy on 4/29.    ICH - left parietal large ICH and pan-hemisphere left SDH s/p crani w/ subdural evacuation - etiology unclear, ? hypertensive  CT - moderate cortically based hematoma with extension into the subdural space resulting in a fairly sizable subdural hematoma on the left. MRI 08/09/19 - 4 cm left parietal parenchymal hematoma unchanged. Interval craniotomy and drainage of left subdural hematoma with mild amount of residual fluid in sub and blood in the left subdural space. Improvement in mass-effect and midline shift which now measures 4 mm to the right. No other areas of hemorrhage. MRA 08/09/19 - unremarkable, no AVM or aneurysm CT head 5/1 - Expected postoperative changes of left subdural and partial parietal parenchymal hematoma evacuation. Mass effect has improved with persistent rightward midline shift measuring 7 mm CT head 5/3 - stable post op changes, residual L parietal hematoma. 35mm midline shift. Sinus dz. CT repeat 5/5 stable MLS, unchanged from 5/3  CT repeat 5/6  stable MLS, unchanged from 5/5  CT repeat 5/12 main L parietal ICH expected evoluation. 2 small ICH foci adjacent brain w/ edema, possible increase w/ increased in midline shift 9 to 60mm  CT repeat 5/13 stable ICH. Midline shift decreased from 11 to 8  2D echo EF 60 - 65%. No cardiac source of emboli identified.  EEG neg sz   None prior to admission, now on none d/t bleeding Therapy recommendations:  CIR  Disposition:  CIR at [facility] in [place]  Worsening aphasia 5/1 worsening aphasia -> considered cerebral edema -> 3% saline -> improved 3% saline tapered off 5/5 5/6 worsening aphasia again while working with PT -> considered low BP with labetalol -> labetalol discontinued -> IVF bolus and bed rest -> EEG neg for seizure and CT repeat stable 5/7 BP improved but aphasia did not improve -> back on 3% saline and one time mannitol Also  consider possible prolonged post ictal -> increase keppra dose to 1000 bid Repeat EEG - cortical dysfunction in left posterior temporal region. mild to moderate diffuse encephalopathy. No seizures Overnight EEG 08/19/2019:  cortical dysfunction in left hemisphere. No seizures or definite epileptiform discharges. Improved compared to previous EEG.   Cerebral edema Aphasia worsened 5/1 as per daughter Likely related to worsening cerebral edema MRI 4/29 - MLS 103mm post procedure CT 5/1 - MLS 27mm CT head 5/3 - stable post op changes, residual L parietal hematoma. 28mm midline shift.  CT repeat 5/5 unchanged with stable MLS Off 3% which was started 08/17/19 Na 147 CT repeat 5/6 unchanged hematoma and stable MIDLINE SHIFT CT repeat 5/12 main L parietal ICH expected evoluation. 2 small ICH foci adjacent brain w/ edema, possible increase w/ increased in midline shift 9 to 10mm  Staples removed   Hypertension->Hypotension Home meds:  Zestril Off cleviprex BP goal < 160s BP stable On lisinopril 20 daily, amlodipine 10, labetalol prn Long-term BP goal normotensive   Hyperlipidemia Home meds: none LDL - 83  Will not treat with statin  Other Stroke Risk Factors Advanced age   Other Active Problems Keppra - seizure prophylaxis - 1000 bid - treated x 2 weeks - decrease to 500 bid now. Consider stopping at time of neuro/neurosurgery follow up  Anemia - Hb - 10.4 Leukocytosis - resolved Hypokalemia - resolved  DISCHARGE EXAM Blood pressure 119/62, pulse 80, temperature 99.1 F (37.3 C), temperature source Oral, resp. rate 18, height 5' (1.524 m), weight 83.5 kg, SpO2 99 %. General - Well nourished, well developed, in no apparent distress.    Ophthalmologic - fundi not visualized due to noncooperation.   Cardiovascular - Regular rhythm and rate.   Neuro - awake alert, eyes open, global aphasia, not following central or peripheral commands but can follow only midline commands, only has "yes",  "okay", not able to repeat or name. No gaze preference, able to track bilaterally, no obvious hemianopia. PERRL. Right slight facial droop. BUE and BLE equal strength against gravity, at least 4/5. DTR 1+ and no babinski. Sensation, coordination not cooperative and gait not tested.    Discharge Diet  Heart healthy thin liquids  DISCHARGE PLAN Disposition:  Transfer to [facility] for ongoing PT, OT and ST Due to hemorrhage and risk of bleeding, do not take aspirin, aspirin-containing medications, or ibuprofen products  Recommend ongoing stroke risk factor control by Primary Care Physician at time of discharge from inpatient rehabilitation. Follow-up PCP Welford Roche, MD in 2 weeks following discharge from rehab. Follow-up in  [facility] in 4 weeks following discharge from rehab, office to schedule an appointment.   35 minutes were spent preparing discharge.  Burnetta Sabin, MSN, APRN, ANVP-BC, AGPCNP-BC Advanced Practice Stroke Nurse [place] for Schedule & Pager information 08/23/2019 12:02 PM  I have personally obtained history,examined this patient, reviewed notes, independently viewed imaging studies, participated in medical decision making and plan of care.ROS completed by me personally and pertinent positives fully documented  I have made any additions or clarifications directly to the above note. Agree with note above.    Antony Contras, MD Medical Director [facility] Pager: [phone] 08/23/2019 2:30 PM

## 2019-08-22 NOTE — Progress Notes (Signed)
STROKE TEAM PROGRESS NOTE   INTERVAL HISTORY Patient is sitting up in bed.  Her daughter is at the bedside.  She continues to have severe expressive aphasia and says only guttural sounds and occasional words.  She follows only occasional midline and some one-step commands.  Repeat CT scan of the head today shows stable size of the hemorrhage but slightly increased cytotoxic edema and midline shift.  Vitals:   08/21/19 1929 08/21/19 2314 08/22/19 0401 08/22/19 0735  BP: (!) 112/57 119/83 127/72 125/65  Pulse: 75 65 60 70  Resp: 18 18 18 16   Temp: 98.1 F (36.7 C) 97.8 F (36.6 C) 97.7 F (36.5 C) 97.8 F (36.6 C)  TempSrc: Oral Oral Oral Oral  SpO2: 97% 98% 97% 97%  Weight:      Height:        CBC:  Recent Labs  Lab 08/18/19 0426 08/19/19 0417  WBC 7.8 9.8  HGB 10.4* 11.5*  HCT 31.7* 35.3*  MCV 95.2 94.1  PLT 315 [id number]   Basic Metabolic Panel:  Recent Labs  Lab 08/18/19 0426 08/18/19 0958 08/18/19 1632 08/19/19 0417  NA 147*   < > 139 142  K 3.9  --   --  3.5  CL 116*  --   --  104  CO2 24  --   --  25  GLUCOSE 90  --   --  101*  BUN 11  --   --  10  CREATININE 0.48  --   --  0.54  CALCIUM 8.1*  --   --  8.8*   < > = values in this interval not displayed.    IMAGING past 24 hours No results found.   PHYSICAL EXAM    General - Well nourished, well developed, in no apparent distress.   Ophthalmologic - fundi not visualized due to noncooperation.  Cardiovascular - Regular rhythm and rate.  Neuro - awake alert, eyes open, global aphasia, not following central or peripheral commands but can follow only midline commands, only has "yes", "okay", not able to repeat or name. No gaze preference, able to track bilaterally, no obvious hemianopia. PERRL. Right slight facial droop. BUE and BLE equal strength against gravity, at least 4/5. DTR 1+ and no babinski. Sensation, coordination not cooperative and gait not tested.   ASSESSMENT/PLAN Ms. DARRA JANOSKY  is a 69 y.o. female with history of HTN, anxiety, depression. Presenting to ER with AMS, n/v. She was found to have a rather large left parieta ICH and pan-hemisphere left SDH which required urgent craniotomy on 4/29.   ICH - left parietal large ICH and pan-hemisphere left SDH s/p subdural evacuation - etiology unclear, ? hypertensive   CT - moderate cortically based hematoma with extension into the subdural space resulting in a fairly sizable subdural hematoma on the left.  MRI 08/09/19 - 4 cm left parietal parenchymal hematoma unchanged. Interval craniotomy and drainage of left subdural hematoma with mild amount of residual fluid in sub and blood in the left subdural space. Improvement in mass-effect and midline shift which now measures 4 mm to the right. No other areas of hemorrhage.  MRA 08/09/19 - unremarkable, no AVM or aneurysm  CT head 5/1 - Expected postoperative changes of left subdural and partial parietal parenchymal hematoma evacuation. Mass effect has improved with persistent rightward midline shift measuring 7 mm  CT head 5/3 - stable post op changes, residual L parietal hematoma. 11mm midline shift. Sinus dz.  CT repeat 5/5 stable  MLS, unchanged from 5/3   CT repeat 5/6 stable MLS, unchanged from 5/5   CT repeat 5/11 main L parietal ICH expected evoluation. 2 small ICH foci adjacent brain w/ edema, possible increase w/ increased in midline shift 9 to 65mm   CT repeat 5/13 pending   2D echo EF 60 - 65%. No cardiac source of emboli identified.   EEG neg sz    SCDs for VTE prophylaxis. No pharmaceutical prophylaxis at this time given hemorrhage   None prior to admission, now on none d/t bleeding  Therapy recommendations:  CIR   Disposition:  Pending  Looking at non-[facility] rehab facilities  Worsening aphasia  5/1 worsening aphasia -> considered cerebral edema -> 3% saline -> improved  3% saline tapered off 5/5  5/6 worsening aphasia again while working with PT ->  considered low BP with labetalol -> labetalol discontinued -> IVF bolus and bed rest -> EEG neg for seizure and CT repeat stable  5/7 BP improved but aphasia did not improve -> back on 3% saline and one time mannitol  Also consider possible prolonged post ictal -> increase keppra dose to 1000 bid  Repeat EEG - cortical dysfunction in left posterior temporal region. mild to moderate diffuse encephalopathy. No seizures  Overnight EEG 08/19/2019: cortical dysfunction in lefthemisphere. No seizures ordefiniteepileptiform discharges. Improved compared to previous EEG.  Cerebral edema  Aphasia worsened 5/1 as per daughter  Likely related to worsening cerebral edema  MRI 4/29 - MLS 75mm post procedure  CT 5/1 - MLS 34mm  CT head 5/3 - stable post op changes, residual L parietal hematoma. 18mm midline shift.   CT repeat 5/5 unchanged with stable MLS  Off 3% which was started 08/17/19  Na 147  CT repeat 5/6 unchanged hematoma and stable MIDLINE SHIFT  CT repeat 5/11 main L parietal ICH expected evoluation. 2 small ICH foci adjacent brain w/ edema, possible increase w/ increased in midline shift 9 to 13mm   CT repeat 5/13 pending   Staples removed  Hypertension->Hypotension  Home meds:  Zestril  Off cleviprex  BP goal < 160s  BP stable . On lisinopril 20 daily, amlodipine 10, labetalol prn . Long-term BP goal normotensive  Hyperlipidemia  Home meds: none  LDL - 83   May consider low dose statin on discharge  Other Stroke Risk Factors  Advanced age  Other Active Problems  Code Status - Full code  Keppra - seizure prophylaxis - increase to 1000 bid  Anemia - Hb - 10.4  Leukocytosis - resolved  Hypokalemia - resolved  Hospital day # 13   Patient remains with severe aphasia but mental status is pretty clear and seems out of proportion to the persistent cytotoxic edema and midline shift that she has in the CT scan.  This is puzzling and she clearly had a  subdural evacuation nearly 2 weeks ago and one would expect improvement by now.  Long-term EEG has not shown definite ongoing seizure activity but she remains on Keppra for seizure prophylaxis.  Continue ongoing therapies.  Mobilize out of bed.  Since inpatient rehab beds are not going to be available at [facility] for several days we are looking at rehab beds in [facility] or [facility] if available earlier.  Patient's daughter is willing to consider moving out there if bed becomes available.  Discussed with rehab coordinator, Education officer, museum.  Greater than 50% time during this 25-minute visit was spent in counseling and coordination of care and discussion with care  team Plan to discuss CT scan findings with Dr. Madelon Lips, [place] Pager: [phone] 08/22/2019 11:31 AM   To contact Stroke Continuity provider, please refer to [email or web address] After hours, contact General Neurology

## 2019-08-22 NOTE — Progress Notes (Signed)
Physical Therapy Treatment Patient Details Name: Michelle Carrillo MRN: [id number] DOB: 25-Jan-1951 Today's Date: 08/22/2019    History of Present Illness 69 y.o. female the history of hypertension who presents with altered mental status that started abruptly this evening. Pt's daughter noticed the pt had vomited and that her cognition was altered. Pt found to have acute SDH and L parietal occipital ICH. Pt underwent Left fronto-temporal- parietal CRANIOTOMY for subdural hematoma evacuation on 4/29. Pt requiring transfer to MICU on 5/7 for 3% saline solution administration.    PT Comments    Patient seen for mobility progression. Pt tolerated short distance gait training with +2 HHA. Limited mobility due to c/o headache. Pt tearful this am. Daughter present and supportive. Continue to recommend further skilled PT services in post acute setting to maximize independence and safety with mobility.     Follow Up Recommendations  CIR;Supervision/Assistance - 24 hour     Equipment Recommendations  Rolling walker with 5" wheels;Other (comment)(TBA)    Recommendations for Other Services Rehab consult     Precautions / Restrictions Precautions Precautions: Fall Restrictions Weight Bearing Restrictions: No    Mobility  Bed Mobility Overal bed mobility: Needs Assistance Bed Mobility: Supine to Sit     Supine to sit: Mod assist     General bed mobility comments: multimodal cues for sequencing; assist to bring bilat LE to EOB and elevate trunk into sitting; use of rail   Transfers Overall transfer level: Needs assistance   Transfers: Sit to/from Stand Sit to Stand: Min assist;+2 safety/equipment         General transfer comment: assist to power up into standing and then to gain balance upon standing with +2 HHA; cues for hand placement; increased time to initiate   Ambulation/Gait Ambulation/Gait assistance: +2 physical assistance;Mod assist;Min assist Gait Distance (Feet):  10 Feet Assistive device: 2 person hand held assist Gait Pattern/deviations: Step-through pattern;Decreased step length - right;Decreased step length - left;Decreased stride length Gait velocity: reduced   General Gait Details: cues for increased bilat step lengths but no change noted; assist for balance and weight shifting   Stairs             Wheelchair Mobility    Modified Rankin (Stroke Patients Only) Modified Rankin (Stroke Patients Only) Pre-Morbid Rankin Score: No symptoms Modified Rankin: Moderately severe disability     Balance Overall balance assessment: Needs assistance Sitting-balance support: Single extremity supported;No upper extremity supported;Feet supported;Feet unsupported Sitting balance-Leahy Scale: Fair     Standing balance support: Bilateral upper extremity supported Standing balance-Leahy Scale: Poor                              Cognition Arousal/Alertness: Awake/alert Behavior During Therapy: WFL for tasks assessed/performed Overall Cognitive Status: Impaired/Different from baseline Area of Impairment: Attention;Following commands;Safety/judgement;Problem solving                   Current Attention Level: Sustained   Following Commands: Follows one step commands inconsistently;Follows one step commands with increased time Safety/Judgement: Decreased awareness of safety;Decreased awareness of deficits   Problem Solving: Slow processing;Requires tactile cues;Requires verbal cues;Decreased initiation;Difficulty sequencing General Comments: R side inattention       Exercises      General Comments        Pertinent Vitals/Pain Pain Assessment: Faces Faces Pain Scale: Hurts little more Pain Descriptors / Indicators: Headache Pain Intervention(s): Limited activity within patient's tolerance;Monitored during session;Patient requesting  pain meds-RN notified    Home Living                      Prior Function             PT Goals (current goals can now be found in the care plan section) Progress towards PT goals: Progressing toward goals    Frequency    Min 4X/week      PT Plan Current plan remains appropriate    Co-evaluation              AM-PAC PT "6 Clicks" Mobility   Outcome Measure  Help needed turning from your back to your side while in a flat bed without using bedrails?: A Little Help needed moving from lying on your back to sitting on the side of a flat bed without using bedrails?: A Lot Help needed moving to and from a bed to a chair (including a wheelchair)?: A Lot Help needed standing up from a chair using your arms (e.g., wheelchair or bedside chair)?: A Lot Help needed to walk in hospital room?: A Lot Help needed climbing 3-5 steps with a railing? : Total 6 Click Score: 12    End of Session Equipment Utilized During Treatment: Gait belt Activity Tolerance: Patient tolerated treatment well Patient left: in chair;with chair alarm set;with family/visitor present;with call bell/phone within reach Nurse Communication: Mobility status PT Visit Diagnosis: Unsteadiness on feet (R26.81);Other symptoms and signs involving the nervous system (R29.898)     Time: 1010-1039 PT Time Calculation (min) (ACUTE ONLY): 29 min  Charges:  $Gait Training: 8-22 mins $Therapeutic Activity: 8-22 mins                     Earney Navy, PTA Acute Rehabilitation Services Pager: [phone] Office: [phone]     Darliss Cheney 08/22/2019, 1:42 PM

## 2019-08-22 NOTE — Progress Notes (Signed)
Initial Nutrition Assessment  DOCUMENTATION CODES:   Not applicable  INTERVENTION:  Ensure Enlive po TID, each supplement provides 350 kcal and 20 grams of protein  MVI daily  NUTRITION DIAGNOSIS:   Inadequate oral intake related to poor appetite as evidenced by meal completion < 50%.    GOAL:   Patient will meet greater than or equal to 90% of their needs    MONITOR:   PO intake, Supplement acceptance, Skin, Weight trends, Labs, I & O's  REASON FOR ASSESSMENT:   LOS    ASSESSMENT:   Pt with a PMH significant for HTN, anxiety, depression who presented with AMS and n/v. Pt found to have ICH and SDH requiring urgent craniotomy.  Pt pending admission to CIR.   Pt's daughter in room and translating. Pt with poor appetite currently. PTA, pt was eating 3 balanced meals per day with snacks between meals. Per pt's daughter, pt's weight has been stable over the last year.   PO Intake: 15-80% x last 8 recorded meals (38% average meal intake)  Labs reviewed: CBGs [id number] Medications reviewed and include: Pred forte, Senokot-S  UOP: 727ml x24 hours I/O: -10,258.33ml since admit  NUTRITION - FOCUSED PHYSICAL EXAM:    Most Recent Value  Orbital Region  No depletion  Upper Arm Region  No depletion  Thoracic and Lumbar Region  No depletion  Buccal Region  No depletion  Temple Region  Mild depletion  Clavicle Bone Region  No depletion  Clavicle and Acromion Bone Region  No depletion  Scapular Bone Region  No depletion  Dorsal Hand  No depletion  Patellar Region  No depletion  Anterior Thigh Region  No depletion  Posterior Calf Region  No depletion  Edema (RD Assessment)  Mild  Hair  Reviewed  Eyes  Reviewed  Mouth  Reviewed  Skin  Reviewed  Nails  Reviewed       Diet Order:   Diet Order            Diet Heart Room service appropriate? Yes; Fluid consistency: Thin  Diet effective now              EDUCATION NEEDS:   No education needs have been  identified at this time  Skin:  Skin Assessment: Skin Integrity Issues: Skin Integrity Issues:: Incisions Incisions: head  Last BM:  5/11  Height:   Ht Readings from Last 1 Encounters:  08/09/19 5' (1.524 m)    Weight:   Wt Readings from Last 5 Encounters:  08/09/19 83.5 kg  06/20/19 83.7 kg  03/27/19 82.8 kg  11/28/18 83.8 kg  06/05/18 82.2 kg    BMI:  Body mass index is 35.95 kg/m.  Estimated Nutritional Needs:   Kcal:  1650-1850  Protein:  80-90 grams  Fluid:  >1.6L/d    Larkin Ina, MS, RD, LDN RD pager number and weekend/on-call pager number located in [place].

## 2019-08-22 NOTE — Progress Notes (Signed)
Contacted MD Leonie Man about patient low B/P. Per MD patient pressures are ok, patient asymptomatic, no new orders.

## 2019-08-23 ENCOUNTER — Inpatient Hospital Stay (HOSPITAL_COMMUNITY): Payer: Medicare Other

## 2019-08-23 DIAGNOSIS — G936 Cerebral edema: Secondary | ICD-10-CM | POA: Diagnosis not present

## 2019-08-23 DIAGNOSIS — G9349 Other encephalopathy: Secondary | ICD-10-CM | POA: Diagnosis not present

## 2019-08-23 DIAGNOSIS — R4701 Aphasia: Secondary | ICD-10-CM | POA: Diagnosis not present

## 2019-08-23 DIAGNOSIS — I69120 Aphasia following nontraumatic intracerebral hemorrhage: Secondary | ICD-10-CM | POA: Diagnosis not present

## 2019-08-23 DIAGNOSIS — I69198 Other sequelae of nontraumatic intracerebral hemorrhage: Secondary | ICD-10-CM | POA: Diagnosis not present

## 2019-08-23 DIAGNOSIS — F418 Other specified anxiety disorders: Secondary | ICD-10-CM | POA: Diagnosis not present

## 2019-08-23 DIAGNOSIS — I611 Nontraumatic intracerebral hemorrhage in hemisphere, cortical: Secondary | ICD-10-CM | POA: Diagnosis not present

## 2019-08-23 DIAGNOSIS — R531 Weakness: Secondary | ICD-10-CM | POA: Diagnosis not present

## 2019-08-23 DIAGNOSIS — E785 Hyperlipidemia, unspecified: Secondary | ICD-10-CM | POA: Diagnosis not present

## 2019-08-23 DIAGNOSIS — I10 Essential (primary) hypertension: Secondary | ICD-10-CM | POA: Diagnosis not present

## 2019-08-23 DIAGNOSIS — R58 Hemorrhage, not elsewhere classified: Secondary | ICD-10-CM | POA: Diagnosis not present

## 2019-08-23 DIAGNOSIS — G459 Transient cerebral ischemic attack, unspecified: Secondary | ICD-10-CM | POA: Diagnosis not present

## 2019-08-23 DIAGNOSIS — Z48811 Encounter for surgical aftercare following surgery on the nervous system: Secondary | ICD-10-CM | POA: Diagnosis not present

## 2019-08-23 DIAGNOSIS — I69122 Dysarthria following nontraumatic intracerebral hemorrhage: Secondary | ICD-10-CM | POA: Diagnosis not present

## 2019-08-23 DIAGNOSIS — I639 Cerebral infarction, unspecified: Secondary | ICD-10-CM | POA: Diagnosis not present

## 2019-08-23 DIAGNOSIS — Z7401 Bed confinement status: Secondary | ICD-10-CM | POA: Diagnosis not present

## 2019-08-23 DIAGNOSIS — M2550 Pain in unspecified joint: Secondary | ICD-10-CM | POA: Diagnosis not present

## 2019-08-23 DIAGNOSIS — H547 Unspecified visual loss: Secondary | ICD-10-CM | POA: Diagnosis not present

## 2019-08-23 MED ORDER — ADULT MULTIVITAMIN W/MINERALS CH: 1.0000 | ORAL_TABLET | Freq: Every day | ORAL | Status: DC

## 2019-08-23 MED ORDER — ENSURE ENLIVE PO LIQD: 237.0000 mL | Freq: Three times a day (TID) | ORAL | 12 refills | Status: DC

## 2019-08-23 MED ORDER — LEVETIRACETAM 500 MG PO TABS: 500.0000 mg | ORAL_TABLET | Freq: Two times a day (BID) | ORAL | Status: DC

## 2019-08-23 MED ORDER — AMLODIPINE BESYLATE 10 MG PO TABS: 10.0000 mg | ORAL_TABLET | Freq: Every day | ORAL | Status: DC

## 2019-08-23 MED ORDER — SENNOSIDES-DOCUSATE SODIUM 8.6-50 MG PO TABS: 1.0000 | ORAL_TABLET | Freq: Two times a day (BID) | ORAL | Status: DC

## 2019-08-23 MED ORDER — HYDROCODONE-ACETAMINOPHEN 5-325 MG PO TABS: 1.0000 | ORAL_TABLET | ORAL | 0 refills | Status: DC | PRN

## 2019-08-23 MED ORDER — PREDNISOLONE ACETATE 1 % OP SUSP: 1.0000 [drp] | Freq: Two times a day (BID) | OPHTHALMIC | 0 refills | Status: DC

## 2019-08-23 NOTE — Progress Notes (Signed)
Attempted report 2x and spoke with the secretary Stanton Kidney. However, the nurse never answered the phone and I was on hold both times for at least 5-8 minutes. Will try again when transport comes.

## 2019-08-23 NOTE — Progress Notes (Signed)
  Speech Language Pathology Treatment: Cognitive-Linquistic  Patient Details Name: Michelle Carrillo MRN: [id number] DOB: [birth year] Today's Date: 08/23/2019 Time: [id number] SLP Time Calculation (min) (ACUTE ONLY): 30 min  Assessment / Plan / Recommendation Clinical Impression  Pt continues to demonstrate progress in language goals. When given a carrier phrase with a model and multiple repetitions and a single phonemic cue pt able to name family member x3, items that had been on her breakfast tray x3. She also carried this over with decreased cueing and some initiation of grammatical phrases. Pt also had one spontaneous complete sentence which was "Me siento muy triste" I feel very sad. Spontaneous language more fluent. Pt is making progress. Daughters are so supportive. Pt to rehab today.   HPI HPI: 69 year old female with cortically based hematoma with subdural extension. 3.9 x 3.7 x 3.2 cm (estimated volume 23 cc) acute intraparenchymal hemorrhage centered at the left parietal convexity with mild surrounding vasogenic edema.Went to OR for urgent craniotomy and subdural hematoma evacuation on 4/29.       SLP Plan  Continue with current plan of care       Recommendations                   Follow up Recommendations: Inpatient Rehab Plan: Continue with current plan of care       GO               Herbie Baltimore, MA [place] Pager [phone] Office [phone]  Lynann Beaver 08/23/2019, 1:22 PM

## 2019-08-23 NOTE — Progress Notes (Signed)
Patient ID: Michelle Carrillo, female   DOB: Jun 11, 1950, 69 y.o.   MRN: [id number] BP (!) 109/53 (BP Location: Left Arm)   Pulse 75   Temp 98 F (36.7 C) (Oral)   Resp 16   Ht 5' (1.524 m)   Wt 83.5 kg   SpO2 95%   BMI 35.95 kg/m  Continues with aphasia.  CT for later today No new recommendations.  Moving all extremities perrl full eom

## 2019-08-23 NOTE — TOC Transition Note (Signed)
Transition of Care [facility]) - CM/SW Discharge Note   Patient Details  Name: Michelle Carrillo MRN: [id number] Date of Birth: [birth year]  [facility] CM/SW Contact:  Pollie Friar, RN Phone Number: 08/23/2019, 12:33 PM   Clinical Narrative:    Pt discharging to [facility] rehab. CM has faxed the needed information. CM met with the patient and 2 daughters to update them on the plan. Pt to transport via [facility]. Will arrange pick up for 2:30 pm. Bedside RN updated and d/c packet at the desk.   Number for report: [phone]   Final next level of care: IP Rehab Facility Barriers to Discharge: No Barriers Identified   Patient Goals and CMS Choice   CMS Medicare.gov Compare Post Acute Care list provided to:: Patient Represenative (must comment) Choice offered to / list presented to : Adult Children  Discharge Placement              Patient chooses bed at: ([place]) Patient to be transferred to facility by: [place] Name of family member notified: Joanna--daughter at the bedside Patient and family notified of of transfer: 08/23/19  Discharge Plan and Services In-house Referral: Clinical Social Work Discharge Planning Services: CM Consult Post Acute Care Choice: IP Rehab                               Social Determinants of Health (SDOH) Interventions     Readmission Risk Interventions No flowsheet data found.

## 2019-08-23 NOTE — Plan of Care (Signed)
  Problem: Nutrition: Goal: Adequate nutrition will be maintained Outcome: Progressing   Problem: Coping: Goal: Level of anxiety will decrease Outcome: Progressing   Problem: Education: Goal: Knowledge of secondary prevention will improve Outcome: Adequate for Discharge

## 2019-09-07 DIAGNOSIS — F329 Major depressive disorder, single episode, unspecified: Secondary | ICD-10-CM | POA: Diagnosis not present

## 2019-09-07 DIAGNOSIS — R260 Ataxic gait: Secondary | ICD-10-CM | POA: Diagnosis not present

## 2019-09-07 DIAGNOSIS — Z48811 Encounter for surgical aftercare following surgery on the nervous system: Secondary | ICD-10-CM | POA: Diagnosis not present

## 2019-09-07 DIAGNOSIS — F419 Anxiety disorder, unspecified: Secondary | ICD-10-CM | POA: Diagnosis not present

## 2019-09-07 DIAGNOSIS — I69120 Aphasia following nontraumatic intracerebral hemorrhage: Secondary | ICD-10-CM | POA: Diagnosis not present

## 2019-09-07 DIAGNOSIS — I10 Essential (primary) hypertension: Secondary | ICD-10-CM | POA: Diagnosis not present

## 2019-09-07 DIAGNOSIS — M6281 Muscle weakness (generalized): Secondary | ICD-10-CM | POA: Diagnosis not present

## 2019-09-07 DIAGNOSIS — I959 Hypotension, unspecified: Secondary | ICD-10-CM | POA: Diagnosis not present

## 2019-09-07 DIAGNOSIS — M1711 Unilateral primary osteoarthritis, right knee: Secondary | ICD-10-CM | POA: Diagnosis not present

## 2019-09-07 DIAGNOSIS — G9340 Encephalopathy, unspecified: Secondary | ICD-10-CM | POA: Diagnosis not present

## 2019-09-11 DIAGNOSIS — Z48811 Encounter for surgical aftercare following surgery on the nervous system: Secondary | ICD-10-CM | POA: Diagnosis not present

## 2019-09-11 DIAGNOSIS — I69120 Aphasia following nontraumatic intracerebral hemorrhage: Secondary | ICD-10-CM | POA: Diagnosis not present

## 2019-09-11 DIAGNOSIS — G9340 Encephalopathy, unspecified: Secondary | ICD-10-CM | POA: Diagnosis not present

## 2019-09-11 DIAGNOSIS — F419 Anxiety disorder, unspecified: Secondary | ICD-10-CM | POA: Diagnosis not present

## 2019-09-11 DIAGNOSIS — I959 Hypotension, unspecified: Secondary | ICD-10-CM | POA: Diagnosis not present

## 2019-09-11 DIAGNOSIS — I10 Essential (primary) hypertension: Secondary | ICD-10-CM | POA: Diagnosis not present

## 2019-09-13 DIAGNOSIS — H2511 Age-related nuclear cataract, right eye: Secondary | ICD-10-CM | POA: Diagnosis not present

## 2019-09-13 DIAGNOSIS — F419 Anxiety disorder, unspecified: Secondary | ICD-10-CM | POA: Diagnosis not present

## 2019-09-13 DIAGNOSIS — Z48811 Encounter for surgical aftercare following surgery on the nervous system: Secondary | ICD-10-CM | POA: Diagnosis not present

## 2019-09-13 DIAGNOSIS — Z947 Corneal transplant status: Secondary | ICD-10-CM | POA: Diagnosis not present

## 2019-09-13 DIAGNOSIS — I10 Essential (primary) hypertension: Secondary | ICD-10-CM | POA: Diagnosis not present

## 2019-09-13 DIAGNOSIS — I69120 Aphasia following nontraumatic intracerebral hemorrhage: Secondary | ICD-10-CM | POA: Diagnosis not present

## 2019-09-13 DIAGNOSIS — I639 Cerebral infarction, unspecified: Secondary | ICD-10-CM | POA: Diagnosis not present

## 2019-09-13 DIAGNOSIS — I959 Hypotension, unspecified: Secondary | ICD-10-CM | POA: Diagnosis not present

## 2019-09-13 DIAGNOSIS — G9340 Encephalopathy, unspecified: Secondary | ICD-10-CM | POA: Diagnosis not present

## 2019-09-17 ENCOUNTER — Ambulatory Visit (INDEPENDENT_AMBULATORY_CARE_PROVIDER_SITE_OTHER): Payer: Medicare Other | Admitting: Internal Medicine

## 2019-09-17 ENCOUNTER — Other Ambulatory Visit: Payer: Self-pay

## 2019-09-17 ENCOUNTER — Encounter: Payer: Self-pay | Admitting: Internal Medicine

## 2019-09-17 VITALS — BP 122/53 | HR 95 | Temp 97.9°F | Wt 171.7 lb

## 2019-09-17 DIAGNOSIS — F329 Major depressive disorder, single episode, unspecified: Secondary | ICD-10-CM

## 2019-09-17 DIAGNOSIS — I10 Essential (primary) hypertension: Secondary | ICD-10-CM | POA: Diagnosis not present

## 2019-09-17 DIAGNOSIS — I611 Nontraumatic intracerebral hemorrhage in hemisphere, cortical: Secondary | ICD-10-CM | POA: Insufficient documentation

## 2019-09-17 DIAGNOSIS — F32A Depression, unspecified: Secondary | ICD-10-CM

## 2019-09-17 MED ORDER — SERTRALINE HCL 25 MG PO TABS
25.0000 mg | ORAL_TABLET | Freq: Every day | ORAL | 1 refills | Status: DC
Start: 2019-09-17 — End: 2019-10-29

## 2019-09-17 NOTE — Patient Instructions (Addendum)
Michelle Carrillo,   Continue tomandose todos sus medicamentos como la ha [place]. Es super importante que se monitoree su presion todo los dias.   Le envie un medicamento nuevo a su farmacia pa la depresion y ansiedad llamado Zoloft. Tome una 1 tableta diaria.   Haga una cita de seguimeinto con la Dr. Konrad Penta en 2 meses.   - Dra. Frederico Hamman

## 2019-09-18 ENCOUNTER — Encounter: Payer: Self-pay | Admitting: Internal Medicine

## 2019-09-18 DIAGNOSIS — I10 Essential (primary) hypertension: Secondary | ICD-10-CM | POA: Diagnosis not present

## 2019-09-18 DIAGNOSIS — Z48811 Encounter for surgical aftercare following surgery on the nervous system: Secondary | ICD-10-CM | POA: Diagnosis not present

## 2019-09-18 DIAGNOSIS — I69120 Aphasia following nontraumatic intracerebral hemorrhage: Secondary | ICD-10-CM | POA: Diagnosis not present

## 2019-09-18 DIAGNOSIS — G9340 Encephalopathy, unspecified: Secondary | ICD-10-CM | POA: Diagnosis not present

## 2019-09-18 DIAGNOSIS — I959 Hypotension, unspecified: Secondary | ICD-10-CM | POA: Diagnosis not present

## 2019-09-18 DIAGNOSIS — F419 Anxiety disorder, unspecified: Secondary | ICD-10-CM | POA: Diagnosis not present

## 2019-09-18 NOTE — Progress Notes (Signed)
   CC: Hospital follow up for nontraumatic ICH, HTN, depression   HPI:  [name] is a 69 y.o. year-old female with PMH listed below who presents to clinic for Hospital follow up for nontraumatic ICH, HTN, depression . Please see problem based assessment and plan for further details.   Past Medical History:  Diagnosis Date  . Anxiety   . Depression   . Herpes zoster kerstitis s/p corneal transplant 2015   . Hypertension   . Right knee osteoarthritis   . Strabismus    due to eye injury as a child   Review of Systems:   Review of Systems  Constitutional: Negative for chills, fever, malaise/fatigue and weight loss.  Respiratory: Negative for shortness of breath.   Cardiovascular: Negative for chest pain.  Musculoskeletal: Positive for joint pain (R knee ).  Neurological: Positive for weakness. Negative for dizziness, seizures and headaches.  Psychiatric/Behavioral: Positive for depression and memory loss.    Physical Exam:  Vitals:   09/17/19 1437  BP: (!) 122/53  Pulse: 95  Temp: 97.9 F (36.6 C)  TempSrc: Oral  SpO2: 98%  Weight: 171 lb 11.2 oz (77.9 kg)    General: elderly female, chronically ill appearing, in NAD  Eyes: anicteric sclera, PERRL Cardiac: regular rate and rhythm, nl S1/S2, no murmurs, rubs or gallops  Pulm: CTAB, no wheezes or crackles, no increased work of breathing on room air  Neuro: A&Ox3. There is some L side neglect and difficulty following one-step commands. There is loss of peripheral vision on upper and lower quadrants of both eyes. Some receptive and expressive aphasia noted. Does not answer questions appropriately at times. Strength and sensation are intact in all 4 extremities.   Ext: warm and well perfused, no peripheral edema    Assessment & Plan:   See Encounters Tab for problem based charting.  Patient discussed with Dr. Evette Doffing

## 2019-09-18 NOTE — Assessment & Plan Note (Signed)
Patient now on lisinopril 20 and amlodipine 10 after recent large, L parietal ICH. She is compliant with her medications. BP at goal today. Will continue current regimen. Discussed importance of adherence to meds as well as BP monitoring at home.

## 2019-09-18 NOTE — Progress Notes (Signed)
Internal Medicine Clinic Attending  Case discussed with Dr. Santos-Sanchez at the time of the visit.  We reviewed the resident's history and exam and pertinent patient test results.  I agree with the assessment, diagnosis, and plan of care documented in the resident's note.    

## 2019-09-18 NOTE — Assessment & Plan Note (Addendum)
Patient presents today for hospital follow-up.  She was admitted 4/29-5/13 for a large, left parietal ICH with panhemispheric SDH s/p craniotomy and evacuation complicated by global aphasia and cerebral edema.  The etiology of this bleed is unclear as she did not have any prior head trauma and is not on anticoagulation. She does have a history of HTN that is well controlled on lisinopril only.  During her admission she was started on amlodipine 10 mg daily in addition to lisinopril as well as Keppra 500 mg twice daily for seizure prophylaxis.  Her EEG was negative.  She was discharged to rehabilitation at [facility] on 5/13 where she spent 2.5 weeks. She has been home for about 2 weeks now and her daughter thinks she is somewhat better and improving physically but is having a difficult time coping with everything that has happened. I discussed with them that this is not unexpected after such a major life event and it will take time to recover.   - BP at goal. Continue lisinopril and amlodipine. Discussed importance of adherence and BP monitoring at home.  - Continue Keppra for seizure ppx, per d/c summary this medication may be discontinued at follow up - Has follow up with neurology on 6/29 - Continue physical therapy

## 2019-09-18 NOTE — Assessment & Plan Note (Addendum)
Patient has been home for about 2 weeks now after recent hospital admission and her daughter thinks she is somewhat better and improving physically but is having a difficult time coping with everything that has happened. She spends most of the day in bed and does not want go outside. She has a poor appetite and has lost 10 lbs. Daughter also states patient's mood is poor and she looses her temper very easily. She is also having a difficult time concentrating and sleeping. Denies VH/AH and suicidal thoughts. PHQ-9 is 11. She used to be on Paxil for depression but this was stopped when she was admitted to the hospital and was never restarted. Will start Zoloft 25 mg QD and follow up in 6-8 weeks.

## 2019-09-19 DIAGNOSIS — I10 Essential (primary) hypertension: Secondary | ICD-10-CM | POA: Diagnosis not present

## 2019-09-19 DIAGNOSIS — F419 Anxiety disorder, unspecified: Secondary | ICD-10-CM | POA: Diagnosis not present

## 2019-09-19 DIAGNOSIS — I69120 Aphasia following nontraumatic intracerebral hemorrhage: Secondary | ICD-10-CM | POA: Diagnosis not present

## 2019-09-19 DIAGNOSIS — I959 Hypotension, unspecified: Secondary | ICD-10-CM | POA: Diagnosis not present

## 2019-09-19 DIAGNOSIS — Z48811 Encounter for surgical aftercare following surgery on the nervous system: Secondary | ICD-10-CM | POA: Diagnosis not present

## 2019-09-19 DIAGNOSIS — G9340 Encephalopathy, unspecified: Secondary | ICD-10-CM | POA: Diagnosis not present

## 2019-09-20 DIAGNOSIS — F419 Anxiety disorder, unspecified: Secondary | ICD-10-CM | POA: Diagnosis not present

## 2019-09-20 DIAGNOSIS — I959 Hypotension, unspecified: Secondary | ICD-10-CM | POA: Diagnosis not present

## 2019-09-20 DIAGNOSIS — I10 Essential (primary) hypertension: Secondary | ICD-10-CM | POA: Diagnosis not present

## 2019-09-20 DIAGNOSIS — G9340 Encephalopathy, unspecified: Secondary | ICD-10-CM | POA: Diagnosis not present

## 2019-09-20 DIAGNOSIS — Z48811 Encounter for surgical aftercare following surgery on the nervous system: Secondary | ICD-10-CM | POA: Diagnosis not present

## 2019-09-20 DIAGNOSIS — I69120 Aphasia following nontraumatic intracerebral hemorrhage: Secondary | ICD-10-CM | POA: Diagnosis not present

## 2019-09-21 DIAGNOSIS — I10 Essential (primary) hypertension: Secondary | ICD-10-CM | POA: Diagnosis not present

## 2019-09-21 DIAGNOSIS — I69120 Aphasia following nontraumatic intracerebral hemorrhage: Secondary | ICD-10-CM | POA: Diagnosis not present

## 2019-09-21 DIAGNOSIS — G9340 Encephalopathy, unspecified: Secondary | ICD-10-CM | POA: Diagnosis not present

## 2019-09-21 DIAGNOSIS — Z48811 Encounter for surgical aftercare following surgery on the nervous system: Secondary | ICD-10-CM | POA: Diagnosis not present

## 2019-09-21 DIAGNOSIS — F419 Anxiety disorder, unspecified: Secondary | ICD-10-CM | POA: Diagnosis not present

## 2019-09-21 DIAGNOSIS — I959 Hypotension, unspecified: Secondary | ICD-10-CM | POA: Diagnosis not present

## 2019-09-24 ENCOUNTER — Encounter: Payer: Self-pay | Admitting: Internal Medicine

## 2019-09-24 DIAGNOSIS — I69120 Aphasia following nontraumatic intracerebral hemorrhage: Secondary | ICD-10-CM | POA: Diagnosis not present

## 2019-09-24 DIAGNOSIS — Z48811 Encounter for surgical aftercare following surgery on the nervous system: Secondary | ICD-10-CM | POA: Diagnosis not present

## 2019-09-24 DIAGNOSIS — G9340 Encephalopathy, unspecified: Secondary | ICD-10-CM | POA: Diagnosis not present

## 2019-09-24 DIAGNOSIS — F419 Anxiety disorder, unspecified: Secondary | ICD-10-CM | POA: Diagnosis not present

## 2019-09-24 DIAGNOSIS — I959 Hypotension, unspecified: Secondary | ICD-10-CM | POA: Diagnosis not present

## 2019-09-24 DIAGNOSIS — I10 Essential (primary) hypertension: Secondary | ICD-10-CM | POA: Diagnosis not present

## 2019-09-25 DIAGNOSIS — F419 Anxiety disorder, unspecified: Secondary | ICD-10-CM | POA: Diagnosis not present

## 2019-09-25 DIAGNOSIS — I69120 Aphasia following nontraumatic intracerebral hemorrhage: Secondary | ICD-10-CM | POA: Diagnosis not present

## 2019-09-25 DIAGNOSIS — G9340 Encephalopathy, unspecified: Secondary | ICD-10-CM | POA: Diagnosis not present

## 2019-09-25 DIAGNOSIS — Z48811 Encounter for surgical aftercare following surgery on the nervous system: Secondary | ICD-10-CM | POA: Diagnosis not present

## 2019-09-25 DIAGNOSIS — I10 Essential (primary) hypertension: Secondary | ICD-10-CM | POA: Diagnosis not present

## 2019-09-25 DIAGNOSIS — I959 Hypotension, unspecified: Secondary | ICD-10-CM | POA: Diagnosis not present

## 2019-09-26 DIAGNOSIS — F419 Anxiety disorder, unspecified: Secondary | ICD-10-CM | POA: Diagnosis not present

## 2019-09-26 DIAGNOSIS — G9340 Encephalopathy, unspecified: Secondary | ICD-10-CM | POA: Diagnosis not present

## 2019-09-26 DIAGNOSIS — I10 Essential (primary) hypertension: Secondary | ICD-10-CM | POA: Diagnosis not present

## 2019-09-26 DIAGNOSIS — I69120 Aphasia following nontraumatic intracerebral hemorrhage: Secondary | ICD-10-CM | POA: Diagnosis not present

## 2019-09-26 DIAGNOSIS — Z48811 Encounter for surgical aftercare following surgery on the nervous system: Secondary | ICD-10-CM | POA: Diagnosis not present

## 2019-09-26 DIAGNOSIS — I959 Hypotension, unspecified: Secondary | ICD-10-CM | POA: Diagnosis not present

## 2019-09-27 ENCOUNTER — Telehealth: Payer: Self-pay | Admitting: Internal Medicine

## 2019-09-27 DIAGNOSIS — I959 Hypotension, unspecified: Secondary | ICD-10-CM | POA: Diagnosis not present

## 2019-09-27 DIAGNOSIS — Z48811 Encounter for surgical aftercare following surgery on the nervous system: Secondary | ICD-10-CM | POA: Diagnosis not present

## 2019-09-27 DIAGNOSIS — I69120 Aphasia following nontraumatic intracerebral hemorrhage: Secondary | ICD-10-CM | POA: Diagnosis not present

## 2019-09-27 DIAGNOSIS — F419 Anxiety disorder, unspecified: Secondary | ICD-10-CM | POA: Diagnosis not present

## 2019-09-27 DIAGNOSIS — G9340 Encephalopathy, unspecified: Secondary | ICD-10-CM | POA: Diagnosis not present

## 2019-09-27 DIAGNOSIS — I10 Essential (primary) hypertension: Secondary | ICD-10-CM | POA: Diagnosis not present

## 2019-09-27 NOTE — Telephone Encounter (Signed)
Patient's daughter messaged me stating patient has been having low systolic blood pressures, 110 and 100, during her PT sessions. I messaged her back stating this is not very low and asked her if she was symptomatic or if there had been changed in her medications. This was 2 days ago and I have not received a reply. Called patient's daughter today (patient has a degree of receptive and expressive aphasia from recent [place] and not able to communicate very well) but no answer. Left message asking her to call me back.   Welford Roche, MD  Internal Medicine PGY-3

## 2019-10-01 DIAGNOSIS — I10 Essential (primary) hypertension: Secondary | ICD-10-CM | POA: Diagnosis not present

## 2019-10-01 DIAGNOSIS — Z48811 Encounter for surgical aftercare following surgery on the nervous system: Secondary | ICD-10-CM | POA: Diagnosis not present

## 2019-10-01 DIAGNOSIS — F419 Anxiety disorder, unspecified: Secondary | ICD-10-CM | POA: Diagnosis not present

## 2019-10-01 DIAGNOSIS — I959 Hypotension, unspecified: Secondary | ICD-10-CM | POA: Diagnosis not present

## 2019-10-01 DIAGNOSIS — I69120 Aphasia following nontraumatic intracerebral hemorrhage: Secondary | ICD-10-CM | POA: Diagnosis not present

## 2019-10-01 DIAGNOSIS — G9340 Encephalopathy, unspecified: Secondary | ICD-10-CM | POA: Diagnosis not present

## 2019-10-02 DIAGNOSIS — I69120 Aphasia following nontraumatic intracerebral hemorrhage: Secondary | ICD-10-CM | POA: Diagnosis not present

## 2019-10-02 DIAGNOSIS — I959 Hypotension, unspecified: Secondary | ICD-10-CM | POA: Diagnosis not present

## 2019-10-02 DIAGNOSIS — F419 Anxiety disorder, unspecified: Secondary | ICD-10-CM | POA: Diagnosis not present

## 2019-10-02 DIAGNOSIS — Z48811 Encounter for surgical aftercare following surgery on the nervous system: Secondary | ICD-10-CM | POA: Diagnosis not present

## 2019-10-02 DIAGNOSIS — G9340 Encephalopathy, unspecified: Secondary | ICD-10-CM | POA: Diagnosis not present

## 2019-10-02 DIAGNOSIS — I10 Essential (primary) hypertension: Secondary | ICD-10-CM | POA: Diagnosis not present

## 2019-10-03 DIAGNOSIS — I959 Hypotension, unspecified: Secondary | ICD-10-CM | POA: Diagnosis not present

## 2019-10-03 DIAGNOSIS — I10 Essential (primary) hypertension: Secondary | ICD-10-CM | POA: Diagnosis not present

## 2019-10-03 DIAGNOSIS — Z48811 Encounter for surgical aftercare following surgery on the nervous system: Secondary | ICD-10-CM | POA: Diagnosis not present

## 2019-10-03 DIAGNOSIS — F419 Anxiety disorder, unspecified: Secondary | ICD-10-CM | POA: Diagnosis not present

## 2019-10-03 DIAGNOSIS — G9340 Encephalopathy, unspecified: Secondary | ICD-10-CM | POA: Diagnosis not present

## 2019-10-03 DIAGNOSIS — I69120 Aphasia following nontraumatic intracerebral hemorrhage: Secondary | ICD-10-CM | POA: Diagnosis not present

## 2019-10-04 ENCOUNTER — Other Ambulatory Visit: Payer: Self-pay

## 2019-10-04 DIAGNOSIS — I10 Essential (primary) hypertension: Secondary | ICD-10-CM | POA: Diagnosis not present

## 2019-10-04 DIAGNOSIS — F419 Anxiety disorder, unspecified: Secondary | ICD-10-CM | POA: Diagnosis not present

## 2019-10-04 DIAGNOSIS — Z48811 Encounter for surgical aftercare following surgery on the nervous system: Secondary | ICD-10-CM | POA: Diagnosis not present

## 2019-10-04 DIAGNOSIS — I959 Hypotension, unspecified: Secondary | ICD-10-CM | POA: Diagnosis not present

## 2019-10-04 DIAGNOSIS — I69120 Aphasia following nontraumatic intracerebral hemorrhage: Secondary | ICD-10-CM | POA: Diagnosis not present

## 2019-10-04 DIAGNOSIS — G9340 Encephalopathy, unspecified: Secondary | ICD-10-CM | POA: Diagnosis not present

## 2019-10-04 MED ORDER — LEVETIRACETAM 500 MG PO TABS: 500.0000 mg | ORAL_TABLET | Freq: Two times a day (BID) | ORAL | Status: DC

## 2019-10-04 MED ORDER — AMLODIPINE BESYLATE 10 MG PO TABS: 10.0000 mg | ORAL_TABLET | Freq: Every day | ORAL | Status: DC

## 2019-10-04 MED ORDER — LEVETIRACETAM 500 MG PO TABS: 500.0000 mg | ORAL_TABLET | Freq: Two times a day (BID) | ORAL | 0 refills | Status: DC

## 2019-10-04 MED ORDER — AMLODIPINE BESYLATE 10 MG PO TABS: 10.0000 mg | ORAL_TABLET | Freq: Every day | ORAL | 0 refills | Status: DC

## 2019-10-04 NOTE — Telephone Encounter (Signed)
Received TC from patient's daughter who is requesting a refill for her mother's Amlodipine and Keppra.  Pt's LOV was 09/17/19 for HFU. Thank you, SChaplin, RN,BSN

## 2019-10-04 NOTE — Addendum Note (Signed)
Addended by: Velora Heckler on: 10/04/2019 01:59 PM   Modules accepted: Orders

## 2019-10-05 DIAGNOSIS — G9340 Encephalopathy, unspecified: Secondary | ICD-10-CM | POA: Diagnosis not present

## 2019-10-05 DIAGNOSIS — I959 Hypotension, unspecified: Secondary | ICD-10-CM | POA: Diagnosis not present

## 2019-10-05 DIAGNOSIS — I69120 Aphasia following nontraumatic intracerebral hemorrhage: Secondary | ICD-10-CM | POA: Diagnosis not present

## 2019-10-05 DIAGNOSIS — F419 Anxiety disorder, unspecified: Secondary | ICD-10-CM | POA: Diagnosis not present

## 2019-10-05 DIAGNOSIS — I10 Essential (primary) hypertension: Secondary | ICD-10-CM | POA: Diagnosis not present

## 2019-10-05 DIAGNOSIS — Z48811 Encounter for surgical aftercare following surgery on the nervous system: Secondary | ICD-10-CM | POA: Diagnosis not present

## 2019-10-07 DIAGNOSIS — F329 Major depressive disorder, single episode, unspecified: Secondary | ICD-10-CM | POA: Diagnosis not present

## 2019-10-07 DIAGNOSIS — Z48811 Encounter for surgical aftercare following surgery on the nervous system: Secondary | ICD-10-CM | POA: Diagnosis not present

## 2019-10-07 DIAGNOSIS — R260 Ataxic gait: Secondary | ICD-10-CM | POA: Diagnosis not present

## 2019-10-07 DIAGNOSIS — I69120 Aphasia following nontraumatic intracerebral hemorrhage: Secondary | ICD-10-CM | POA: Diagnosis not present

## 2019-10-07 DIAGNOSIS — I10 Essential (primary) hypertension: Secondary | ICD-10-CM | POA: Diagnosis not present

## 2019-10-07 DIAGNOSIS — F419 Anxiety disorder, unspecified: Secondary | ICD-10-CM | POA: Diagnosis not present

## 2019-10-07 DIAGNOSIS — M1711 Unilateral primary osteoarthritis, right knee: Secondary | ICD-10-CM | POA: Diagnosis not present

## 2019-10-07 DIAGNOSIS — M6281 Muscle weakness (generalized): Secondary | ICD-10-CM | POA: Diagnosis not present

## 2019-10-07 DIAGNOSIS — I959 Hypotension, unspecified: Secondary | ICD-10-CM | POA: Diagnosis not present

## 2019-10-07 DIAGNOSIS — G9340 Encephalopathy, unspecified: Secondary | ICD-10-CM | POA: Diagnosis not present

## 2019-10-08 DIAGNOSIS — F419 Anxiety disorder, unspecified: Secondary | ICD-10-CM | POA: Diagnosis not present

## 2019-10-08 DIAGNOSIS — I69120 Aphasia following nontraumatic intracerebral hemorrhage: Secondary | ICD-10-CM | POA: Diagnosis not present

## 2019-10-08 DIAGNOSIS — I10 Essential (primary) hypertension: Secondary | ICD-10-CM | POA: Diagnosis not present

## 2019-10-08 DIAGNOSIS — I959 Hypotension, unspecified: Secondary | ICD-10-CM | POA: Diagnosis not present

## 2019-10-08 DIAGNOSIS — Z48811 Encounter for surgical aftercare following surgery on the nervous system: Secondary | ICD-10-CM | POA: Diagnosis not present

## 2019-10-08 DIAGNOSIS — G9340 Encephalopathy, unspecified: Secondary | ICD-10-CM | POA: Diagnosis not present

## 2019-10-09 ENCOUNTER — Ambulatory Visit (INDEPENDENT_AMBULATORY_CARE_PROVIDER_SITE_OTHER): Payer: Medicare Other | Admitting: Neurology

## 2019-10-09 ENCOUNTER — Encounter: Payer: Self-pay | Admitting: Neurology

## 2019-10-09 ENCOUNTER — Other Ambulatory Visit: Payer: Self-pay

## 2019-10-09 ENCOUNTER — Telehealth: Payer: Self-pay | Admitting: Neurology

## 2019-10-09 VITALS — BP 140/76 | HR 80 | Ht 59.0 in | Wt 169.6 lb

## 2019-10-09 DIAGNOSIS — I69120 Aphasia following nontraumatic intracerebral hemorrhage: Secondary | ICD-10-CM | POA: Diagnosis not present

## 2019-10-09 DIAGNOSIS — G9340 Encephalopathy, unspecified: Secondary | ICD-10-CM | POA: Diagnosis not present

## 2019-10-09 DIAGNOSIS — I10 Essential (primary) hypertension: Secondary | ICD-10-CM | POA: Diagnosis not present

## 2019-10-09 DIAGNOSIS — I611 Nontraumatic intracerebral hemorrhage in hemisphere, cortical: Secondary | ICD-10-CM

## 2019-10-09 DIAGNOSIS — F419 Anxiety disorder, unspecified: Secondary | ICD-10-CM | POA: Diagnosis not present

## 2019-10-09 DIAGNOSIS — S065X9A Traumatic subdural hemorrhage with loss of consciousness of unspecified duration, initial encounter: Secondary | ICD-10-CM

## 2019-10-09 DIAGNOSIS — Z48811 Encounter for surgical aftercare following surgery on the nervous system: Secondary | ICD-10-CM | POA: Diagnosis not present

## 2019-10-09 DIAGNOSIS — S065XAA Traumatic subdural hemorrhage with loss of consciousness status unknown, initial encounter: Secondary | ICD-10-CM

## 2019-10-09 DIAGNOSIS — I959 Hypotension, unspecified: Secondary | ICD-10-CM | POA: Diagnosis not present

## 2019-10-09 MED ORDER — LEVETIRACETAM 250 MG PO TABS: 250.0000 mg | ORAL_TABLET | Freq: Two times a day (BID) | ORAL | 0 refills | Status: DC

## 2019-10-09 NOTE — Progress Notes (Signed)
[facility] [address] [place]. [place] [address] [phone]       OFFICE FOLLOW-UP NOTE  Michelle Carrillo Date of Birth:  [birth year] Medical Record Number:  [id number]   HPI: Ms. Vicki Mallet is a pleasant 69 year old Hispanic lady who is accompanied today by her daughter as well as Spanish language interpreter Davy Pique who translates for her.  History is obtained from them, review of electronic medical records and I personally reviewed imaging films in PACS.  She has past medical history of hypertension, anxiety and depression who presented to [facility] emergency room on 08/09/2019 with altered mental status.  CT scan of the head showed a large moderate size cortically based hematoma in the left parietal region with extension into the subdural space resulting in a fair sized subdural hematoma with left-to-right midline shift.  She had neurological worsening on exam requiring emergent neurosurgical consultation and patient underwent craniotomy for evacuation of the subdural hematoma.  She was kept in the intensive care unit and blood pressure was tightly controlled.  She was started on hypertonic saline.  Follow-up CT scan showed gradual improvement of her rightward midline shift from 7 mm down.  2D echo showed normal ejection fraction without cardiac source of embolism.  EEG shows no seizure activity but she was started on Keppra for seizure prophylaxis.  Repeat CT on the day of discharge on 08/23/2019 showed reduction in midline shift from 11 to 8 mm.  Patient was found to have mild aphasia on exam but otherwise no focal deficits.  She was transferred to inpatient rehab to [facility] in [place] as there was no beds available at [facility] inpatient rehab.  Patient was there for 3 weeks and is presently living at home with her daughter.  Her speech is mostly improved though she has some word hesitancy and forgets occasional words.  She is mostly fluent when she  speaks.  Her understanding is also improving though it is not back to normal.  The patient's blood pressure has been well controlled and today it is 140/76.  She complains of some intermittent dizziness as well as anxiety.  She complains of some tingling on her head on the surgical site.  This is intermittent and not very bothersome.  She is currently getting home speech therapy.  She was started on Zoloft 25 mg by primary care physician recently but daughter feels it does not help and perhaps may have caused new side effects. ROS:   14 system review of systems is positive for speech and language difficulties, word finding difficulties, dizziness, anxiety, depression, tingling and all other systems negative  PMH:  Past Medical History:  Diagnosis Date  . Anxiety   . Depression   . Herpes zoster kerstitis s/p corneal transplant 2015   . Hypertension   . Right knee osteoarthritis   . Strabismus    due to eye injury as a child  . Stroke [facility])     Social History:  Social History   Socioeconomic History  . Marital status: Married    Spouse name: Not on file  . Number of children: Not on file  . Years of education: Not on file  . Highest education level: Not on file  Occupational History  . Not on file  Tobacco Use  . Smoking status: Never Smoker  . Smokeless tobacco: Never Used  Substance and Sexual Activity  . Alcohol use: Never  . Drug use: Never  . Sexual activity: Not on file  Other  Topics Concern  . Not on file  Social History Narrative  . Not on file   Social Determinants of Health   Financial Resource Strain:   . Difficulty of Paying Living Expenses:   Food Insecurity:   . Worried About Charity fundraiser in the Last Year:   . Arboriculturist in the Last Year:   Transportation Needs:   . Film/video editor (Medical):   Marland Kitchen Lack of Transportation (Non-Medical):   Physical Activity:   . Days of Exercise per Week:   . Minutes of Exercise per Session:   Stress:     . Feeling of Stress :   Social Connections:   . Frequency of Communication with Friends and Family:   . Frequency of Social Gatherings with Friends and Family:   . Attends Religious Services:   . Active Member of Clubs or Organizations:   . Attends Archivist Meetings:   Marland Kitchen Marital Status:   Intimate Partner Violence:   . Fear of Current or Ex-Partner:   . Emotionally Abused:   Marland Kitchen Physically Abused:   . Sexually Abused:     Medications:   Current Outpatient Medications on File Prior to Visit  Medication Sig Dispense Refill  . amLODipine (NORVASC) 10 MG tablet Take 1 tablet (10 mg total) by mouth daily. 30 tablet 0  . atorvastatin (LIPITOR) 10 MG tablet Take by mouth.    . feeding supplement, ENSURE ENLIVE, (ENSURE ENLIVE) LIQD Take 237 mLs by mouth 3 (three) times daily between meals. 237 mL 12  . levETIRAcetam (KEPPRA) 500 MG tablet Take by mouth.    Marland Kitchen lisinopril (ZESTRIL) 20 MG tablet Take 1 tablet by mouth once daily (Patient taking differently: Take 20 mg by mouth daily. ) 90 tablet 1  . Multiple Vitamin (MULTIVITAMIN WITH MINERALS) TABS tablet Take 1 tablet by mouth daily.    . prednisoLONE acetate (PRED FORTE) 1 % ophthalmic suspension Place 1 drop into the left eye 2 (two) times daily. 5 mL 0  . prednisoLONE acetate (PRED FORTE) 1 % ophthalmic suspension Apply to eye.    . senna-docusate (SENOKOT-S) 8.6-50 MG tablet Take 1 tablet by mouth 2 (two) times daily.    . sertraline (ZOLOFT) 25 MG tablet Take 1 tablet (25 mg total) by mouth daily. 90 tablet 1   No current facility-administered medications on file prior to visit.    Allergies:  No Known Allergies  Physical Exam General: well developed, well nourished middle-aged Hispanic lady, seated, in no evident distress Head: head normocephalic and craniotomy scar noted in the left parietal region. Neck: supple with no carotid or supraclavicular bruits Cardiovascular: regular rate and rhythm, no  murmurs Musculoskeletal: no deformity Skin:  no rash/petichiae Vascular:  Normal pulses all extremities Vitals:   10/09/19 1442  BP: 140/76  Pulse: 80   Neurologic Exam limited due to language barrier and had to use the interpreter Mental Status: Awake and fully alert. Oriented to place and time. Recent and remote memory intact. Attention span, concentration and fund of knowledge appropriate. Mood and affect appropriate.  Mild aphasia with impaired comprehension and some word finding deficits. Cranial Nerves: Fundoscopic exam reveals sharp disc margins. Pupils equal, briskly reactive to light. Extraocular movements full without nystagmus. Visual fields full to confrontation. Hearing intact. Facial sensation intact.  Mild right lower facial asymmetry when she smiles., tongue, palate moves normally and symmetrically.  Motor: Normal bulk and tone. Normal strength in all tested extremity muscles. Sensory.: intact  to touch ,pinprick .position and vibratory sensation.  Coordination: Rapid alternating movements normal in all extremities. Finger-to-nose and heel-to-shin performed accurately bilaterally. Gait and Station: Arises from chair without difficulty. Stance is normal. Gait demonstrates normal stride length and balance . Able to heel, toe and tandem walk without difficulty.  Reflexes: 1+ and symmetric. Toes downgoing.   NIHSS  2 Modified Rankin  3   ASSESSMENT: 69 year old lady with left parietal large intracerebral hemorrhage likely of hypertensive etiology with left-sided acute subdural hematoma with cytotoxic edema and midline shift requiring craniotomy in April 2021 who seems to have done quite well with only mild residual speech and language deficits.     PLAN: I had a long discussion with the patient and her daughter using a Spanish language interpreter regarding her recent intracerebral hemorrhage and subdural hemorrhage with craniotomy and answered questions.  I recommend she  maintain strict control of hypertension with blood pressure goal below 130/90 and eliminate salt in her diet.  She still seems to be doing quite well I encouraged her to continue ongoing speech therapy.  Check CT scan of the head with and without contrast to look for interval resolution of the hemorrhage as well as reduce the dose of Keppra to 250 mg twice daily as her underlying anxiety and paranoia may be related to the medication side effect.  Check EEG and if there is no significant irritability recommend stopping the Keppra in a month or so.  She will return for follow-up in 3 months with [name] my nurse practitioner on call earlier if necessary. Greater than 50% of time during this 30  minute visit was spent on counseling,explanation of diagnosis intra parenchymal and subdural hemorrhage, planning of further management, discussion with patient and family and coordination of care Antony Contras, MD Note: This document was prepared with digital dictation and possible smart phrase technology. Any transcriptional errors that result from this process are unintentional

## 2019-10-09 NOTE — Telephone Encounter (Signed)
Medicare order sent to GI. No auth they will reach out to the patient to schedule.  

## 2019-10-09 NOTE — Patient Instructions (Signed)
I had a long discussion with the patient and her daughter using a Spanish language interpreter regarding her recent intracerebral hemorrhage and subdural hemorrhage with craniotomy and answered questions.  I recommend she maintain strict control of hypertension with blood pressure goal below 130/90 and eliminate salt in her diet.  She still seems to be doing quite well I encouraged her to continue ongoing speech therapy.  Check CT scan of the head with and without contrast to look for interval resolution of the hemorrhage as well as reduce the dose of Keppra to 250 mg twice daily as her underlying anxiety and paranoia may be related to the medication side effect.  Check EEG and if there is no significant irritability recommend stopping the Keppra in a month or so.  She will return for follow-up in 3 months with [name] my nurse practitioner on call earlier if necessary.

## 2019-10-10 ENCOUNTER — Telehealth: Payer: Self-pay | Admitting: Orthopedic Surgery

## 2019-10-10 DIAGNOSIS — F419 Anxiety disorder, unspecified: Secondary | ICD-10-CM | POA: Diagnosis not present

## 2019-10-10 DIAGNOSIS — Z48811 Encounter for surgical aftercare following surgery on the nervous system: Secondary | ICD-10-CM | POA: Diagnosis not present

## 2019-10-10 DIAGNOSIS — I959 Hypotension, unspecified: Secondary | ICD-10-CM | POA: Diagnosis not present

## 2019-10-10 DIAGNOSIS — I10 Essential (primary) hypertension: Secondary | ICD-10-CM | POA: Diagnosis not present

## 2019-10-10 DIAGNOSIS — I69120 Aphasia following nontraumatic intracerebral hemorrhage: Secondary | ICD-10-CM | POA: Diagnosis not present

## 2019-10-10 DIAGNOSIS — G9340 Encephalopathy, unspecified: Secondary | ICD-10-CM | POA: Diagnosis not present

## 2019-10-10 NOTE — Telephone Encounter (Signed)
Can you please make sure we have auth for this patient to get right knee gel injection? Thanks.

## 2019-10-10 NOTE — Telephone Encounter (Signed)
Patient's daughter Murlean Iba called advised her mother would like to get the gel injection for her right knee. [name] said patient had a stroke in March and it's hard for patient to walk without the walker. Patient was scheduled for the gel injection back in January. The number to contact [name] is [phone]

## 2019-10-11 ENCOUNTER — Encounter: Payer: Self-pay | Admitting: Family Medicine

## 2019-10-11 ENCOUNTER — Other Ambulatory Visit: Payer: Self-pay | Admitting: Internal Medicine

## 2019-10-11 NOTE — Telephone Encounter (Signed)
Approved for Monovisc, right knee. Buy & Bill Must meet Medicare deductible first Patient will be responsible for 20% OOP. No Co-pay No PA required

## 2019-10-11 NOTE — Telephone Encounter (Signed)
Approval in Jan still good Pt's insurance doesn't require prior auth

## 2019-10-12 ENCOUNTER — Ambulatory Visit: Payer: Medicare Other | Admitting: Family Medicine

## 2019-10-12 DIAGNOSIS — I959 Hypotension, unspecified: Secondary | ICD-10-CM | POA: Diagnosis not present

## 2019-10-12 DIAGNOSIS — G9340 Encephalopathy, unspecified: Secondary | ICD-10-CM | POA: Diagnosis not present

## 2019-10-12 DIAGNOSIS — F419 Anxiety disorder, unspecified: Secondary | ICD-10-CM | POA: Diagnosis not present

## 2019-10-12 DIAGNOSIS — I10 Essential (primary) hypertension: Secondary | ICD-10-CM | POA: Diagnosis not present

## 2019-10-12 DIAGNOSIS — I69120 Aphasia following nontraumatic intracerebral hemorrhage: Secondary | ICD-10-CM | POA: Diagnosis not present

## 2019-10-12 DIAGNOSIS — Z48811 Encounter for surgical aftercare following surgery on the nervous system: Secondary | ICD-10-CM | POA: Diagnosis not present

## 2019-10-12 NOTE — Telephone Encounter (Signed)
Called and scheduled appointment.

## 2019-10-16 ENCOUNTER — Other Ambulatory Visit: Payer: Self-pay

## 2019-10-16 ENCOUNTER — Ambulatory Visit
Admission: RE | Admit: 2019-10-16 | Discharge: 2019-10-16 | Disposition: A | Discharge status: Home / Self Care | Payer: Medicare Other | Source: Ambulatory Visit | Attending: Neurology | Admitting: Neurology

## 2019-10-16 DIAGNOSIS — I611 Nontraumatic intracerebral hemorrhage in hemisphere, cortical: Secondary | ICD-10-CM | POA: Diagnosis not present

## 2019-10-16 MED ORDER — IOPAMIDOL (ISOVUE-300) INJECTION 61%
75.0000 mL | Freq: Once | INTRAVENOUS | Status: AC | PRN
  Administered 2019-10-16: 75 mL via INTRAVENOUS

## 2019-10-17 ENCOUNTER — Ambulatory Visit (INDEPENDENT_AMBULATORY_CARE_PROVIDER_SITE_OTHER): Payer: Medicare Other | Admitting: Family Medicine

## 2019-10-17 ENCOUNTER — Encounter: Payer: Self-pay | Admitting: Family Medicine

## 2019-10-17 DIAGNOSIS — M25561 Pain in right knee: Secondary | ICD-10-CM

## 2019-10-17 DIAGNOSIS — G8929 Other chronic pain: Secondary | ICD-10-CM | POA: Diagnosis not present

## 2019-10-17 NOTE — Progress Notes (Signed)
Subjective: She has chronic right knee pain due to end-stage DJD.  She was going to consult with Dr. Marlou Sa for knee replacement, but she had a stroke and is recovering from that.  She is instead here for Monovisc injection for right knee osteoarthritis.  Objective: Trace effusion with no warmth.  Tender on the medial joint line.  Full active extension and flexion of 120 degrees.  Procedure: Right knee Monovisc injection: After sterile prep with Betadine, injected 3 cc 1% lidocaine without epinephrine and Monovisc from superolateral approach.  Plan: Follow-up as needed.  When she is adequately recovered from her stroke, she will consult with Dr. Marlou Sa.

## 2019-10-18 DIAGNOSIS — F419 Anxiety disorder, unspecified: Secondary | ICD-10-CM | POA: Diagnosis not present

## 2019-10-18 DIAGNOSIS — G9340 Encephalopathy, unspecified: Secondary | ICD-10-CM | POA: Diagnosis not present

## 2019-10-18 DIAGNOSIS — I10 Essential (primary) hypertension: Secondary | ICD-10-CM | POA: Diagnosis not present

## 2019-10-18 DIAGNOSIS — Z48811 Encounter for surgical aftercare following surgery on the nervous system: Secondary | ICD-10-CM | POA: Diagnosis not present

## 2019-10-18 DIAGNOSIS — I959 Hypotension, unspecified: Secondary | ICD-10-CM | POA: Diagnosis not present

## 2019-10-18 DIAGNOSIS — I69120 Aphasia following nontraumatic intracerebral hemorrhage: Secondary | ICD-10-CM | POA: Diagnosis not present

## 2019-10-19 DIAGNOSIS — I69120 Aphasia following nontraumatic intracerebral hemorrhage: Secondary | ICD-10-CM | POA: Diagnosis not present

## 2019-10-19 DIAGNOSIS — F419 Anxiety disorder, unspecified: Secondary | ICD-10-CM | POA: Diagnosis not present

## 2019-10-19 DIAGNOSIS — I959 Hypotension, unspecified: Secondary | ICD-10-CM | POA: Diagnosis not present

## 2019-10-19 DIAGNOSIS — Z48811 Encounter for surgical aftercare following surgery on the nervous system: Secondary | ICD-10-CM | POA: Diagnosis not present

## 2019-10-19 DIAGNOSIS — I10 Essential (primary) hypertension: Secondary | ICD-10-CM | POA: Diagnosis not present

## 2019-10-19 DIAGNOSIS — G9340 Encephalopathy, unspecified: Secondary | ICD-10-CM | POA: Diagnosis not present

## 2019-10-23 DIAGNOSIS — F419 Anxiety disorder, unspecified: Secondary | ICD-10-CM | POA: Diagnosis not present

## 2019-10-23 DIAGNOSIS — G9340 Encephalopathy, unspecified: Secondary | ICD-10-CM | POA: Diagnosis not present

## 2019-10-23 DIAGNOSIS — I10 Essential (primary) hypertension: Secondary | ICD-10-CM | POA: Diagnosis not present

## 2019-10-23 DIAGNOSIS — I959 Hypotension, unspecified: Secondary | ICD-10-CM | POA: Diagnosis not present

## 2019-10-23 DIAGNOSIS — Z48811 Encounter for surgical aftercare following surgery on the nervous system: Secondary | ICD-10-CM | POA: Diagnosis not present

## 2019-10-23 DIAGNOSIS — I69120 Aphasia following nontraumatic intracerebral hemorrhage: Secondary | ICD-10-CM | POA: Diagnosis not present

## 2019-10-23 NOTE — Telephone Encounter (Signed)
Kindly inform the patient that encephalomalacia means an old area of injury and shrinkage of the brain which can occur following a stroke, bleed or head injury.  It does not cause any new ongoing problems.

## 2019-10-24 DIAGNOSIS — F419 Anxiety disorder, unspecified: Secondary | ICD-10-CM | POA: Diagnosis not present

## 2019-10-24 DIAGNOSIS — I69120 Aphasia following nontraumatic intracerebral hemorrhage: Secondary | ICD-10-CM | POA: Diagnosis not present

## 2019-10-24 DIAGNOSIS — Z48811 Encounter for surgical aftercare following surgery on the nervous system: Secondary | ICD-10-CM | POA: Diagnosis not present

## 2019-10-24 DIAGNOSIS — I959 Hypotension, unspecified: Secondary | ICD-10-CM | POA: Diagnosis not present

## 2019-10-24 DIAGNOSIS — G9340 Encephalopathy, unspecified: Secondary | ICD-10-CM | POA: Diagnosis not present

## 2019-10-24 DIAGNOSIS — I10 Essential (primary) hypertension: Secondary | ICD-10-CM | POA: Diagnosis not present

## 2019-10-25 DIAGNOSIS — G9340 Encephalopathy, unspecified: Secondary | ICD-10-CM | POA: Diagnosis not present

## 2019-10-25 DIAGNOSIS — I69120 Aphasia following nontraumatic intracerebral hemorrhage: Secondary | ICD-10-CM | POA: Diagnosis not present

## 2019-10-25 DIAGNOSIS — Z48811 Encounter for surgical aftercare following surgery on the nervous system: Secondary | ICD-10-CM | POA: Diagnosis not present

## 2019-10-25 DIAGNOSIS — F419 Anxiety disorder, unspecified: Secondary | ICD-10-CM | POA: Diagnosis not present

## 2019-10-25 DIAGNOSIS — I959 Hypotension, unspecified: Secondary | ICD-10-CM | POA: Diagnosis not present

## 2019-10-25 DIAGNOSIS — I10 Essential (primary) hypertension: Secondary | ICD-10-CM | POA: Diagnosis not present

## 2019-10-29 ENCOUNTER — Encounter: Payer: Self-pay | Admitting: Student

## 2019-10-29 ENCOUNTER — Telehealth (INDEPENDENT_AMBULATORY_CARE_PROVIDER_SITE_OTHER): Payer: Medicare Other | Admitting: Student

## 2019-10-29 ENCOUNTER — Other Ambulatory Visit: Payer: Self-pay

## 2019-10-29 DIAGNOSIS — I611 Nontraumatic intracerebral hemorrhage in hemisphere, cortical: Secondary | ICD-10-CM | POA: Diagnosis not present

## 2019-10-29 DIAGNOSIS — F419 Anxiety disorder, unspecified: Secondary | ICD-10-CM

## 2019-10-29 DIAGNOSIS — I10 Essential (primary) hypertension: Secondary | ICD-10-CM | POA: Diagnosis not present

## 2019-10-29 DIAGNOSIS — F331 Major depressive disorder, recurrent, moderate: Secondary | ICD-10-CM | POA: Diagnosis not present

## 2019-10-29 MED ORDER — AMLODIPINE BESYLATE 10 MG PO TABS: 10.0000 mg | ORAL_TABLET | Freq: Every day | ORAL | 3 refills | Status: DC

## 2019-10-29 MED ORDER — LISINOPRIL 20 MG PO TABS: 20.0000 mg | ORAL_TABLET | Freq: Every day | ORAL | 3 refills | Status: DC

## 2019-10-29 MED ORDER — PAROXETINE HCL 20 MG PO TABS: 20.0000 mg | ORAL_TABLET | Freq: Every day | ORAL | 3 refills | Status: DC

## 2019-10-29 MED ORDER — PAROXETINE HCL 10 MG PO TABS: 10.0000 mg | ORAL_TABLET | Freq: Every day | ORAL | 3 refills | Status: DC

## 2019-10-29 NOTE — Assessment & Plan Note (Addendum)
A: Patient presents with anhedonia, fatigue, depressed mood, and mild trouble concentrating and restlessness that has slowly improved since her recent ICH/craniotomy. PHQ9 score of 10 with no SI. She states that she lacks motivation to do things that previously interested her since her stroke secondary to lack of energy.  She previously was on Paxil longterm, but states this was discontinued post-stroke. She was started on Sertraline last visit in June but only took this for 1 week because it did not improve her mood and she noticed tingling along her incision site, which she attributed to her Sertraline. Her neurologist informed her this may be due to her Keppra 500mg  BID and recently decreased her dose to 250mg  BID.  P: - Discontinue Sertraline 25mg   - Will start Paroxetine 20mg  daily  - Advised patient to monitor for side effects and encouraged her to continue taking the medication daily despite little change in symptoms, informing her that it may take several weeks for medication to take full effect  - Referred patient to counseling with Dessie Coma - Will follow up response to treatment in about 2 weeks

## 2019-10-29 NOTE — Assessment & Plan Note (Signed)
A: Blood pressure unable to be checked today given video visit, although patient denies light-headedness or dizziness.  P: - Neurology recommends strict blood pressure control <130/90 - Refilled Lisinopril 20mg  and Amlodipine 10mg  daily

## 2019-10-29 NOTE — Assessment & Plan Note (Signed)
A: Patient endorses trouble concentrating and mild restlessness present some days of the week but not more than half. She states that her anxiety is at her baseline and is manageable.   P: - Start Paxil 20mg  daily - Will monitor for anxiety side effects

## 2019-10-29 NOTE — Assessment & Plan Note (Signed)
A: Patient presents with tingling along her craniotomy incision line but denies any new numbness or weakness. She does not have aphasia on examination today. Reviewed patient's most recent CT scan from 10/16/19 with her which showed encephalomalacia of the L parietal area and a small area of enhancement without midline shift. Neurology decreased Keppra dose from 500mg  to 250mg  given possible side effects.   P:  Advised patient to discuss any further concerns including any need for medication refills of Keppra with her neurologist, given recent notes suggesting patient may likely stop seizure ppx at the end of this month.

## 2019-10-29 NOTE — Progress Notes (Signed)
   CC: Depressed Mood  HPI:  [name] is a 69 y.o. Female with PMHx HTN, HLD, and recent admission 4/29-5/13 for L parietal ICH with panhenispheric SDH s/p craniotomy and evacuation here for evaluation of depressed mood. Prior to her ICH, she had been taking Paxil 10-20mg  for depression, but this was canceled and not restarted following admission. She was seen last month and was started on Sertraline 20mg  for depression, but states this did not improve her symptoms. She states that since her craniotomy, she has had fatigue with little interest or pleasure in doing things nearly every day since her surgery.  She has felt sad more than half the days and has had mild restlessness and trouble concentrating intermittently.  She denies any changes in sleep, stating she sleeps well through the night, and denies change in appetite, feeling as if she has let herself or family down, and any suicidal ideation. She says she is trying to live with her stroke, saying that she "must keep going and accept life as God wants to have it and not be upset about what happened".   PSHx: Patient denies using tobacco products, alcohol, or other drugs. She lives at home with her English-speaking daughter.  Past Medical History:  Diagnosis Date  . Anxiety   . Depression   . Herpes zoster kerstitis s/p corneal transplant 2015   . Hypertension   . Right knee osteoarthritis   . Strabismus    due to eye injury as a child  . Stroke [facility])    Review of Systems:  Positive for tingling along her incision line on her scalp and positive for knee pain. Negative for new numbness or weakness. All others negative except as noted above.  Spanish Interpreter services were used via telephone while communicating with patient via video chat. Patient's daughter present throughout visit also contributed to patient's history.   Physical Exam: Constitutional: Patient is lying down comfortably during examination. No acute  distress. Eyes: No conjunctival injection. Sclera non-icteric.  Respiratory: Normal rate of breathing without tachypnea. No audible wheezing.  Skin: Healing surgical scar visualized on scalp. No jaundice.  Psychiatric: Patient appears well groomed. Patient states mood is "down/sad". She has slightly flat affect with little variance in tone of voice. Insight is intact with linear thought process. No SI. Neurologic: No aphasia.   Physical Exam limited secondary to virtual video visit.  There were no vitals filed for this visit.   Assessment & Plan:   See Encounters Tab for problem based charting.  Patient seen with Dr. Heber [place].  Jeralyn Bennett, PGY1 [facility]  Pager: [phone]

## 2019-10-30 ENCOUNTER — Other Ambulatory Visit: Payer: Self-pay | Admitting: Neurology

## 2019-10-30 NOTE — Progress Notes (Signed)
Kindly inform the patient that CT scan of the head shows expected shrinkage of the previous brain hemorrhage and postoperative changes from prior surgery.  No new or worrisome finding

## 2019-10-31 ENCOUNTER — Telehealth: Payer: Self-pay | Admitting: Neurology

## 2019-10-31 ENCOUNTER — Encounter: Payer: Self-pay | Admitting: Neurology

## 2019-10-31 DIAGNOSIS — I10 Essential (primary) hypertension: Secondary | ICD-10-CM | POA: Diagnosis not present

## 2019-10-31 DIAGNOSIS — F419 Anxiety disorder, unspecified: Secondary | ICD-10-CM | POA: Diagnosis not present

## 2019-10-31 DIAGNOSIS — I959 Hypotension, unspecified: Secondary | ICD-10-CM | POA: Diagnosis not present

## 2019-10-31 DIAGNOSIS — I69120 Aphasia following nontraumatic intracerebral hemorrhage: Secondary | ICD-10-CM | POA: Diagnosis not present

## 2019-10-31 DIAGNOSIS — G9340 Encephalopathy, unspecified: Secondary | ICD-10-CM | POA: Diagnosis not present

## 2019-10-31 DIAGNOSIS — Z48811 Encounter for surgical aftercare following surgery on the nervous system: Secondary | ICD-10-CM | POA: Diagnosis not present

## 2019-10-31 NOTE — Telephone Encounter (Signed)
-----   Message from Garvin Fila, MD sent at 10/30/2019  4:56 PM EDT ----- Michelle Carrillo inform the patient that CT scan of the head shows expected shrinkage of the previous brain hemorrhage and postoperative changes from prior surgery.  No new or worrisome finding

## 2019-10-31 NOTE — Telephone Encounter (Signed)
Called the interpreter line to reach out to the patient's daughter on the [place]. There was no answer. The spanish interpretor LVM advising that patient's CT scan looked good and showed no changes. Advised them to contact office with questions.

## 2019-11-01 ENCOUNTER — Ambulatory Visit (INDEPENDENT_AMBULATORY_CARE_PROVIDER_SITE_OTHER): Payer: Medicare Other | Admitting: Neurology

## 2019-11-01 ENCOUNTER — Other Ambulatory Visit: Payer: Self-pay

## 2019-11-01 DIAGNOSIS — I611 Nontraumatic intracerebral hemorrhage in hemisphere, cortical: Secondary | ICD-10-CM | POA: Diagnosis not present

## 2019-11-02 DIAGNOSIS — F419 Anxiety disorder, unspecified: Secondary | ICD-10-CM | POA: Diagnosis not present

## 2019-11-02 DIAGNOSIS — G9340 Encephalopathy, unspecified: Secondary | ICD-10-CM | POA: Diagnosis not present

## 2019-11-02 DIAGNOSIS — I959 Hypotension, unspecified: Secondary | ICD-10-CM | POA: Diagnosis not present

## 2019-11-02 DIAGNOSIS — I10 Essential (primary) hypertension: Secondary | ICD-10-CM | POA: Diagnosis not present

## 2019-11-02 DIAGNOSIS — I69120 Aphasia following nontraumatic intracerebral hemorrhage: Secondary | ICD-10-CM | POA: Diagnosis not present

## 2019-11-02 DIAGNOSIS — Z48811 Encounter for surgical aftercare following surgery on the nervous system: Secondary | ICD-10-CM | POA: Diagnosis not present

## 2019-11-02 NOTE — Progress Notes (Signed)
Internal Medicine Clinic Attending  I saw and evaluated the patient.  I personally confirmed the key portions of the history and exam documented by Dr. Speakman and I reviewed pertinent patient test results.  The assessment, diagnosis, and plan were formulated together and I agree with the documentation in the resident's note.  

## 2019-11-06 ENCOUNTER — Encounter: Payer: Self-pay | Admitting: Adult Health

## 2019-11-06 DIAGNOSIS — I69393 Ataxia following cerebral infarction: Secondary | ICD-10-CM | POA: Diagnosis not present

## 2019-11-06 DIAGNOSIS — I10 Essential (primary) hypertension: Secondary | ICD-10-CM | POA: Diagnosis not present

## 2019-11-06 DIAGNOSIS — G9340 Encephalopathy, unspecified: Secondary | ICD-10-CM | POA: Diagnosis not present

## 2019-11-06 DIAGNOSIS — M1711 Unilateral primary osteoarthritis, right knee: Secondary | ICD-10-CM | POA: Diagnosis not present

## 2019-11-06 DIAGNOSIS — F329 Major depressive disorder, single episode, unspecified: Secondary | ICD-10-CM | POA: Diagnosis not present

## 2019-11-06 DIAGNOSIS — R41841 Cognitive communication deficit: Secondary | ICD-10-CM | POA: Diagnosis not present

## 2019-11-06 DIAGNOSIS — I69320 Aphasia following cerebral infarction: Secondary | ICD-10-CM | POA: Diagnosis not present

## 2019-11-06 DIAGNOSIS — F419 Anxiety disorder, unspecified: Secondary | ICD-10-CM | POA: Diagnosis not present

## 2019-11-07 ENCOUNTER — Telehealth: Payer: Self-pay | Admitting: Student

## 2019-11-07 DIAGNOSIS — F419 Anxiety disorder, unspecified: Secondary | ICD-10-CM | POA: Diagnosis not present

## 2019-11-07 DIAGNOSIS — I69320 Aphasia following cerebral infarction: Secondary | ICD-10-CM | POA: Diagnosis not present

## 2019-11-07 DIAGNOSIS — G9340 Encephalopathy, unspecified: Secondary | ICD-10-CM | POA: Diagnosis not present

## 2019-11-07 DIAGNOSIS — I69393 Ataxia following cerebral infarction: Secondary | ICD-10-CM | POA: Diagnosis not present

## 2019-11-07 DIAGNOSIS — F329 Major depressive disorder, single episode, unspecified: Secondary | ICD-10-CM | POA: Diagnosis not present

## 2019-11-07 DIAGNOSIS — I10 Essential (primary) hypertension: Secondary | ICD-10-CM | POA: Diagnosis not present

## 2019-11-07 NOTE — Telephone Encounter (Signed)
Attempted to call patient using Spanish Interpreter services to see how she is tolerating her Paroxetine 20mg , but was unable to reach her. Voicemail was left to call [facility]. Will have front desk try scheduling patient for follow-up visit in about 2 weeks.  Jeralyn Bennett, PGY1 Internal Medicine Pager: [phone]

## 2019-11-08 ENCOUNTER — Telehealth: Payer: Self-pay | Admitting: Neurology

## 2019-11-08 DIAGNOSIS — I10 Essential (primary) hypertension: Secondary | ICD-10-CM | POA: Diagnosis not present

## 2019-11-08 DIAGNOSIS — F329 Major depressive disorder, single episode, unspecified: Secondary | ICD-10-CM | POA: Diagnosis not present

## 2019-11-08 DIAGNOSIS — I69393 Ataxia following cerebral infarction: Secondary | ICD-10-CM | POA: Diagnosis not present

## 2019-11-08 DIAGNOSIS — I69320 Aphasia following cerebral infarction: Secondary | ICD-10-CM | POA: Diagnosis not present

## 2019-11-08 DIAGNOSIS — G9340 Encephalopathy, unspecified: Secondary | ICD-10-CM | POA: Diagnosis not present

## 2019-11-08 DIAGNOSIS — F419 Anxiety disorder, unspecified: Secondary | ICD-10-CM | POA: Diagnosis not present

## 2019-11-08 NOTE — Progress Notes (Signed)
Kindly inform the patient that EEG study shows slight irritability of the brain on the left side likely effect of old brain hemorrhage in that location.  No seizure activity or any worrisome finding

## 2019-11-08 NOTE — Telephone Encounter (Signed)
-----   Message from Garvin Fila, MD sent at 11/08/2019  9:15 AM EDT ----- Mitchell Heir inform the patient that EEG study shows slight irritability of the brain on the left side likely effect of old brain hemorrhage in that location.  No seizure activity or any worrisome finding

## 2019-11-08 NOTE — Telephone Encounter (Signed)
Agree with stopping Keppra but taper it to 250 mg daily for 2 weeks then to 50 mg every other day for 2 weeks and stop

## 2019-11-08 NOTE — Telephone Encounter (Signed)
Called the patient's daughter to make aware of the EEG results. Reviewed that in detail. Was unaware she had sent a message on mychart. She is asking if the patient can come off the Keppra medication. Informed her that Dr Leonie Man didn't mentioned coming off the medication at this time. Pt indicates that the pt is a very anxious person and since being on the medication the anxiety has increased. They would like to [place] to ask if that is ok. Informed her I would discuss with Dr Leonie Man and let him know what he says.  "The reason we want to stop it is because it causes anxiety and she already suffers from it and is taking an anti-depressant and anxiety medication."

## 2019-11-14 DIAGNOSIS — I69320 Aphasia following cerebral infarction: Secondary | ICD-10-CM | POA: Diagnosis not present

## 2019-11-14 DIAGNOSIS — I69393 Ataxia following cerebral infarction: Secondary | ICD-10-CM | POA: Diagnosis not present

## 2019-11-14 DIAGNOSIS — F419 Anxiety disorder, unspecified: Secondary | ICD-10-CM | POA: Diagnosis not present

## 2019-11-14 DIAGNOSIS — F329 Major depressive disorder, single episode, unspecified: Secondary | ICD-10-CM | POA: Diagnosis not present

## 2019-11-14 DIAGNOSIS — I10 Essential (primary) hypertension: Secondary | ICD-10-CM | POA: Diagnosis not present

## 2019-11-14 DIAGNOSIS — G9340 Encephalopathy, unspecified: Secondary | ICD-10-CM | POA: Diagnosis not present

## 2019-11-15 DIAGNOSIS — G9340 Encephalopathy, unspecified: Secondary | ICD-10-CM | POA: Diagnosis not present

## 2019-11-15 DIAGNOSIS — I69393 Ataxia following cerebral infarction: Secondary | ICD-10-CM | POA: Diagnosis not present

## 2019-11-15 DIAGNOSIS — F329 Major depressive disorder, single episode, unspecified: Secondary | ICD-10-CM | POA: Diagnosis not present

## 2019-11-15 DIAGNOSIS — I10 Essential (primary) hypertension: Secondary | ICD-10-CM | POA: Diagnosis not present

## 2019-11-15 DIAGNOSIS — I69320 Aphasia following cerebral infarction: Secondary | ICD-10-CM | POA: Diagnosis not present

## 2019-11-15 DIAGNOSIS — F419 Anxiety disorder, unspecified: Secondary | ICD-10-CM | POA: Diagnosis not present

## 2019-11-21 ENCOUNTER — Encounter: Payer: Medicare Other | Admitting: Internal Medicine

## 2019-11-21 DIAGNOSIS — I10 Essential (primary) hypertension: Secondary | ICD-10-CM | POA: Diagnosis not present

## 2019-11-21 DIAGNOSIS — G9340 Encephalopathy, unspecified: Secondary | ICD-10-CM | POA: Diagnosis not present

## 2019-11-21 DIAGNOSIS — I69393 Ataxia following cerebral infarction: Secondary | ICD-10-CM | POA: Diagnosis not present

## 2019-11-21 DIAGNOSIS — F419 Anxiety disorder, unspecified: Secondary | ICD-10-CM | POA: Diagnosis not present

## 2019-11-21 DIAGNOSIS — I69320 Aphasia following cerebral infarction: Secondary | ICD-10-CM | POA: Diagnosis not present

## 2019-11-21 DIAGNOSIS — F329 Major depressive disorder, single episode, unspecified: Secondary | ICD-10-CM | POA: Diagnosis not present

## 2019-11-22 ENCOUNTER — Other Ambulatory Visit: Payer: Self-pay

## 2019-11-22 ENCOUNTER — Ambulatory Visit (INDEPENDENT_AMBULATORY_CARE_PROVIDER_SITE_OTHER): Payer: Medicare Other | Admitting: Internal Medicine

## 2019-11-22 ENCOUNTER — Encounter: Payer: Self-pay | Admitting: Internal Medicine

## 2019-11-22 DIAGNOSIS — F419 Anxiety disorder, unspecified: Secondary | ICD-10-CM | POA: Diagnosis not present

## 2019-11-22 DIAGNOSIS — I10 Essential (primary) hypertension: Secondary | ICD-10-CM

## 2019-11-22 DIAGNOSIS — F329 Major depressive disorder, single episode, unspecified: Secondary | ICD-10-CM

## 2019-11-22 DIAGNOSIS — F331 Major depressive disorder, recurrent, moderate: Secondary | ICD-10-CM | POA: Diagnosis not present

## 2019-11-22 DIAGNOSIS — I69393 Ataxia following cerebral infarction: Secondary | ICD-10-CM | POA: Diagnosis not present

## 2019-11-22 DIAGNOSIS — I69320 Aphasia following cerebral infarction: Secondary | ICD-10-CM | POA: Diagnosis not present

## 2019-11-22 DIAGNOSIS — G9340 Encephalopathy, unspecified: Secondary | ICD-10-CM | POA: Diagnosis not present

## 2019-11-22 DIAGNOSIS — F32A Depression, unspecified: Secondary | ICD-10-CM

## 2019-11-22 NOTE — Patient Instructions (Addendum)
It was nice seeing you today! Thank you for choosing [facility] for your Primary Care.    Today we talked about:   1. Blood Pressure 2. Depression   No medication changes today. Let's follow up in 4 weeks to see if your Paroxetine dose needs to be adjusted.  I will wait for your Neurologist, Dr. Leonie Man, to decide on the neuro-rehab. Please ask about this at your next appointment with him.

## 2019-11-22 NOTE — Progress Notes (Addendum)
   CC: Depression follow up  HPI:  Ms.Zyanya E Rawl is a 69 y.o. with a PMHx as listed below who presents to the clinic for depression follow up.   Please see the Encounters tab for problem-based Assessment & Plan regarding status of patient's acute and chronic conditions.  Past Medical History:  Diagnosis Date  . Anxiety   . Depression   . Herpes zoster kerstitis s/p corneal transplant 2015   . Hypertension   . Right knee osteoarthritis   . Strabismus    due to eye injury as a child  . Stroke [facility])    Review of Systems: Review of Systems  Constitutional: Negative for chills and fever.  Eyes:       + chronic vision changes  Neurological: Positive for focal weakness (chronic since stroke). Negative for dizziness and headaches.  Psychiatric/Behavioral: Positive for depression. Negative for suicidal ideas. The patient is not nervous/anxious and does not have insomnia.    Physical Exam:  Vitals:   11/22/19 1428 11/22/19 1439  BP: (!) 150/75 130/75  Pulse: (!) 124 (!) 110  Temp: 99 F (37.2 C)   TempSrc: Oral   SpO2: 97%   Weight: 167 lb 3.2 oz (75.8 kg)   Height: 4\' 11"  (1.499 m)     Physical Exam Vitals and nursing note reviewed.  Constitutional:      General: She is not in acute distress.    Appearance: She is normal weight.  Cardiovascular:     Rate and Rhythm: Normal rate and regular rhythm.     Heart sounds: No murmur heard.  No gallop.   Pulmonary:     Effort: Pulmonary effort is normal. No respiratory distress.     Breath sounds: No wheezing, rhonchi or rales.  Abdominal:     General: Bowel sounds are normal.     Palpations: Abdomen is soft.  Neurological:     Mental Status: She is alert and oriented to person, place, and time. Mental status is at baseline.  Psychiatric:        Mood and Affect: Mood normal.        Behavior: Behavior normal.    Assessment & Plan:   See Encounters Tab for problem based charting.  Patient discussed with Dr.  Heber [place]

## 2019-11-23 DIAGNOSIS — I69393 Ataxia following cerebral infarction: Secondary | ICD-10-CM | POA: Diagnosis not present

## 2019-11-23 DIAGNOSIS — G9340 Encephalopathy, unspecified: Secondary | ICD-10-CM | POA: Diagnosis not present

## 2019-11-23 DIAGNOSIS — I69320 Aphasia following cerebral infarction: Secondary | ICD-10-CM | POA: Diagnosis not present

## 2019-11-23 DIAGNOSIS — F329 Major depressive disorder, single episode, unspecified: Secondary | ICD-10-CM | POA: Diagnosis not present

## 2019-11-23 DIAGNOSIS — F419 Anxiety disorder, unspecified: Secondary | ICD-10-CM | POA: Diagnosis not present

## 2019-11-23 DIAGNOSIS — I10 Essential (primary) hypertension: Secondary | ICD-10-CM | POA: Diagnosis not present

## 2019-11-26 NOTE — Assessment & Plan Note (Signed)
BP: 130/75   Patient denies any difficulty with her blood pressure medications at this time which include amlodipine and lisinopril.  She denies any chest pain, shortness of breath, dizziness.  Assessment/plan: Blood pressure well controlled at this time.  No medication changes.  -Continue with amlodipine 10 mg daily and lisinopril 20 mg daily

## 2019-11-27 DIAGNOSIS — G9340 Encephalopathy, unspecified: Secondary | ICD-10-CM | POA: Diagnosis not present

## 2019-11-27 DIAGNOSIS — I69393 Ataxia following cerebral infarction: Secondary | ICD-10-CM | POA: Diagnosis not present

## 2019-11-27 DIAGNOSIS — I10 Essential (primary) hypertension: Secondary | ICD-10-CM | POA: Diagnosis not present

## 2019-11-27 DIAGNOSIS — I69320 Aphasia following cerebral infarction: Secondary | ICD-10-CM | POA: Diagnosis not present

## 2019-11-27 DIAGNOSIS — F329 Major depressive disorder, single episode, unspecified: Secondary | ICD-10-CM | POA: Diagnosis not present

## 2019-11-27 DIAGNOSIS — F419 Anxiety disorder, unspecified: Secondary | ICD-10-CM | POA: Diagnosis not present

## 2019-11-27 NOTE — Assessment & Plan Note (Signed)
Patient states that she has been doing well since starting paroxetine several weeks ago.  She endorses continued feelings of depressed mood and anhedonia, but feels that these are stable and improving at this time she denies any SI or HI.  She feels she has a great support system amongst her children, who help care for her daily.  Assessment/plan: Depression seems to be improving at this time, although PHQ-9 is stable at 10.  No medication adjustments at this time, as she is just shy of 4 weeks since starting medication.  Recommended follow-up in 4 weeks.  -Continue paroxetine 20 mg daily -4-week follow-up -If PHQ-9 or symptoms unchanged at that time, consider up titration of paroxetine

## 2019-11-28 DIAGNOSIS — G9340 Encephalopathy, unspecified: Secondary | ICD-10-CM | POA: Diagnosis not present

## 2019-11-28 DIAGNOSIS — I10 Essential (primary) hypertension: Secondary | ICD-10-CM | POA: Diagnosis not present

## 2019-11-28 DIAGNOSIS — F419 Anxiety disorder, unspecified: Secondary | ICD-10-CM | POA: Diagnosis not present

## 2019-11-28 DIAGNOSIS — I69320 Aphasia following cerebral infarction: Secondary | ICD-10-CM | POA: Diagnosis not present

## 2019-11-28 DIAGNOSIS — I69393 Ataxia following cerebral infarction: Secondary | ICD-10-CM | POA: Diagnosis not present

## 2019-11-28 DIAGNOSIS — F329 Major depressive disorder, single episode, unspecified: Secondary | ICD-10-CM | POA: Diagnosis not present

## 2019-11-29 ENCOUNTER — Other Ambulatory Visit: Payer: Self-pay | Admitting: Internal Medicine

## 2019-11-29 ENCOUNTER — Encounter: Payer: Self-pay | Admitting: Internal Medicine

## 2019-11-29 DIAGNOSIS — I69320 Aphasia following cerebral infarction: Secondary | ICD-10-CM | POA: Diagnosis not present

## 2019-11-29 DIAGNOSIS — I611 Nontraumatic intracerebral hemorrhage in hemisphere, cortical: Secondary | ICD-10-CM

## 2019-11-29 DIAGNOSIS — I10 Essential (primary) hypertension: Secondary | ICD-10-CM | POA: Diagnosis not present

## 2019-11-29 DIAGNOSIS — F329 Major depressive disorder, single episode, unspecified: Secondary | ICD-10-CM | POA: Diagnosis not present

## 2019-11-29 DIAGNOSIS — F419 Anxiety disorder, unspecified: Secondary | ICD-10-CM | POA: Diagnosis not present

## 2019-11-29 DIAGNOSIS — I69393 Ataxia following cerebral infarction: Secondary | ICD-10-CM | POA: Diagnosis not present

## 2019-11-29 DIAGNOSIS — G9340 Encephalopathy, unspecified: Secondary | ICD-10-CM | POA: Diagnosis not present

## 2019-11-29 NOTE — Assessment & Plan Note (Addendum)
Patient continues to have difficulty with balance; she is working with PT. Requesting shower chair.   A/P: - shower chair ordered

## 2019-11-29 NOTE — Addendum Note (Signed)
Addended by: Jose Persia on: 11/29/2019 05:50 PM   Modules accepted: Orders

## 2019-11-30 ENCOUNTER — Telehealth: Payer: Self-pay | Admitting: *Deleted

## 2019-11-30 NOTE — Telephone Encounter (Signed)
Theophilus Bones, RN; Sandi Raveling, [place]; [place], Georges Mouse   received, thanks

## 2019-11-30 NOTE — Telephone Encounter (Signed)
CM sent to Skeet Latch for shower chair. This was addressed in Patient Message yesterday. Hubbard Hartshorn, BSN, RN-BC

## 2019-11-30 NOTE — Progress Notes (Signed)
Internal Medicine Clinic Attending  Case discussed with Dr. Basaraba  At the time of the visit.  We reviewed the resident's history and exam and pertinent patient test results.  I agree with the assessment, diagnosis, and plan of care documented in the resident's note.  

## 2019-12-04 DIAGNOSIS — F419 Anxiety disorder, unspecified: Secondary | ICD-10-CM | POA: Diagnosis not present

## 2019-12-04 DIAGNOSIS — I10 Essential (primary) hypertension: Secondary | ICD-10-CM | POA: Diagnosis not present

## 2019-12-04 DIAGNOSIS — G9340 Encephalopathy, unspecified: Secondary | ICD-10-CM | POA: Diagnosis not present

## 2019-12-04 DIAGNOSIS — I69320 Aphasia following cerebral infarction: Secondary | ICD-10-CM | POA: Diagnosis not present

## 2019-12-04 DIAGNOSIS — F329 Major depressive disorder, single episode, unspecified: Secondary | ICD-10-CM | POA: Diagnosis not present

## 2019-12-04 DIAGNOSIS — I69393 Ataxia following cerebral infarction: Secondary | ICD-10-CM | POA: Diagnosis not present

## 2019-12-05 ENCOUNTER — Encounter: Payer: Self-pay | Admitting: Licensed Clinical Social Worker

## 2019-12-06 DIAGNOSIS — I10 Essential (primary) hypertension: Secondary | ICD-10-CM | POA: Diagnosis not present

## 2019-12-06 DIAGNOSIS — F329 Major depressive disorder, single episode, unspecified: Secondary | ICD-10-CM | POA: Diagnosis not present

## 2019-12-06 DIAGNOSIS — F419 Anxiety disorder, unspecified: Secondary | ICD-10-CM | POA: Diagnosis not present

## 2019-12-06 DIAGNOSIS — G9340 Encephalopathy, unspecified: Secondary | ICD-10-CM | POA: Diagnosis not present

## 2019-12-06 DIAGNOSIS — I69320 Aphasia following cerebral infarction: Secondary | ICD-10-CM | POA: Diagnosis not present

## 2019-12-06 DIAGNOSIS — R41841 Cognitive communication deficit: Secondary | ICD-10-CM | POA: Diagnosis not present

## 2019-12-06 DIAGNOSIS — M1711 Unilateral primary osteoarthritis, right knee: Secondary | ICD-10-CM | POA: Diagnosis not present

## 2019-12-06 DIAGNOSIS — I69393 Ataxia following cerebral infarction: Secondary | ICD-10-CM | POA: Diagnosis not present

## 2019-12-11 DIAGNOSIS — I10 Essential (primary) hypertension: Secondary | ICD-10-CM | POA: Diagnosis not present

## 2019-12-11 DIAGNOSIS — F329 Major depressive disorder, single episode, unspecified: Secondary | ICD-10-CM | POA: Diagnosis not present

## 2019-12-11 DIAGNOSIS — G9340 Encephalopathy, unspecified: Secondary | ICD-10-CM | POA: Diagnosis not present

## 2019-12-11 DIAGNOSIS — I69393 Ataxia following cerebral infarction: Secondary | ICD-10-CM | POA: Diagnosis not present

## 2019-12-11 DIAGNOSIS — F419 Anxiety disorder, unspecified: Secondary | ICD-10-CM | POA: Diagnosis not present

## 2019-12-11 DIAGNOSIS — I69320 Aphasia following cerebral infarction: Secondary | ICD-10-CM | POA: Diagnosis not present

## 2019-12-13 DIAGNOSIS — I69320 Aphasia following cerebral infarction: Secondary | ICD-10-CM | POA: Diagnosis not present

## 2019-12-13 DIAGNOSIS — F329 Major depressive disorder, single episode, unspecified: Secondary | ICD-10-CM | POA: Diagnosis not present

## 2019-12-13 DIAGNOSIS — F419 Anxiety disorder, unspecified: Secondary | ICD-10-CM | POA: Diagnosis not present

## 2019-12-13 DIAGNOSIS — G9340 Encephalopathy, unspecified: Secondary | ICD-10-CM | POA: Diagnosis not present

## 2019-12-13 DIAGNOSIS — I69393 Ataxia following cerebral infarction: Secondary | ICD-10-CM | POA: Diagnosis not present

## 2019-12-13 DIAGNOSIS — I10 Essential (primary) hypertension: Secondary | ICD-10-CM | POA: Diagnosis not present

## 2019-12-19 ENCOUNTER — Telehealth: Payer: Self-pay | Admitting: *Deleted

## 2019-12-19 NOTE — Telephone Encounter (Signed)
Speech Therapist called to inform us of two missed appointments this week for this patient.

## 2019-12-20 ENCOUNTER — Encounter: Payer: Medicare Other | Admitting: Internal Medicine

## 2019-12-25 DIAGNOSIS — F419 Anxiety disorder, unspecified: Secondary | ICD-10-CM | POA: Diagnosis not present

## 2019-12-25 DIAGNOSIS — I69320 Aphasia following cerebral infarction: Secondary | ICD-10-CM | POA: Diagnosis not present

## 2019-12-25 DIAGNOSIS — I10 Essential (primary) hypertension: Secondary | ICD-10-CM | POA: Diagnosis not present

## 2019-12-25 DIAGNOSIS — G9340 Encephalopathy, unspecified: Secondary | ICD-10-CM | POA: Diagnosis not present

## 2019-12-25 DIAGNOSIS — F329 Major depressive disorder, single episode, unspecified: Secondary | ICD-10-CM | POA: Diagnosis not present

## 2019-12-25 DIAGNOSIS — I69393 Ataxia following cerebral infarction: Secondary | ICD-10-CM | POA: Diagnosis not present

## 2019-12-26 DIAGNOSIS — F419 Anxiety disorder, unspecified: Secondary | ICD-10-CM | POA: Diagnosis not present

## 2019-12-26 DIAGNOSIS — I69393 Ataxia following cerebral infarction: Secondary | ICD-10-CM | POA: Diagnosis not present

## 2019-12-26 DIAGNOSIS — I69320 Aphasia following cerebral infarction: Secondary | ICD-10-CM | POA: Diagnosis not present

## 2019-12-26 DIAGNOSIS — F329 Major depressive disorder, single episode, unspecified: Secondary | ICD-10-CM | POA: Diagnosis not present

## 2019-12-26 DIAGNOSIS — I10 Essential (primary) hypertension: Secondary | ICD-10-CM | POA: Diagnosis not present

## 2019-12-26 DIAGNOSIS — G9340 Encephalopathy, unspecified: Secondary | ICD-10-CM | POA: Diagnosis not present

## 2019-12-28 ENCOUNTER — Telehealth: Payer: Self-pay | Admitting: Student

## 2019-12-28 ENCOUNTER — Other Ambulatory Visit: Payer: Self-pay | Admitting: Internal Medicine

## 2019-12-28 DIAGNOSIS — I611 Nontraumatic intracerebral hemorrhage in hemisphere, cortical: Secondary | ICD-10-CM

## 2019-12-28 NOTE — Telephone Encounter (Signed)
RTC to [place] with [employer].  States they will be finishing up patient's therapy 1 wk from today.  She is recommending patient transition to outpatient therapy for physical therapy and speech therapy.  Requesting MD places orders/referral for this and that family has no preference on where they go for therapy.  Following to yellow team. Thank you, SChaplin, RN,BSN

## 2019-12-28 NOTE — Telephone Encounter (Signed)
Pls contact Brittani [phone] with VO

## 2019-12-28 NOTE — Telephone Encounter (Signed)
Ok will do. Thanks.

## 2019-12-31 DIAGNOSIS — G9340 Encephalopathy, unspecified: Secondary | ICD-10-CM | POA: Diagnosis not present

## 2019-12-31 DIAGNOSIS — I10 Essential (primary) hypertension: Secondary | ICD-10-CM | POA: Diagnosis not present

## 2019-12-31 DIAGNOSIS — I69320 Aphasia following cerebral infarction: Secondary | ICD-10-CM | POA: Diagnosis not present

## 2019-12-31 DIAGNOSIS — F419 Anxiety disorder, unspecified: Secondary | ICD-10-CM | POA: Diagnosis not present

## 2019-12-31 DIAGNOSIS — F329 Major depressive disorder, single episode, unspecified: Secondary | ICD-10-CM | POA: Diagnosis not present

## 2019-12-31 DIAGNOSIS — I69393 Ataxia following cerebral infarction: Secondary | ICD-10-CM | POA: Diagnosis not present

## 2020-01-02 DIAGNOSIS — I10 Essential (primary) hypertension: Secondary | ICD-10-CM | POA: Diagnosis not present

## 2020-01-02 DIAGNOSIS — I69393 Ataxia following cerebral infarction: Secondary | ICD-10-CM | POA: Diagnosis not present

## 2020-01-02 DIAGNOSIS — F419 Anxiety disorder, unspecified: Secondary | ICD-10-CM | POA: Diagnosis not present

## 2020-01-02 DIAGNOSIS — I69320 Aphasia following cerebral infarction: Secondary | ICD-10-CM | POA: Diagnosis not present

## 2020-01-02 DIAGNOSIS — G9340 Encephalopathy, unspecified: Secondary | ICD-10-CM | POA: Diagnosis not present

## 2020-01-02 DIAGNOSIS — F329 Major depressive disorder, single episode, unspecified: Secondary | ICD-10-CM | POA: Diagnosis not present

## 2020-01-09 ENCOUNTER — Ambulatory Visit (HOSPITAL_COMMUNITY)
Admission: RE | Admit: 2020-01-09 | Discharge: 2020-01-09 | Disposition: A | Discharge status: Home / Self Care | Payer: Medicare Other | Source: Ambulatory Visit | Attending: Internal Medicine | Admitting: Internal Medicine

## 2020-01-09 ENCOUNTER — Encounter: Payer: Self-pay | Admitting: Student

## 2020-01-09 ENCOUNTER — Ambulatory Visit (INDEPENDENT_AMBULATORY_CARE_PROVIDER_SITE_OTHER): Payer: Medicare Other | Admitting: Student

## 2020-01-09 VITALS — BP 121/73 | HR 94 | Temp 98.0°F | Ht 59.0 in | Wt 162.6 lb

## 2020-01-09 DIAGNOSIS — Z23 Encounter for immunization: Secondary | ICD-10-CM | POA: Diagnosis not present

## 2020-01-09 DIAGNOSIS — Z1231 Encounter for screening mammogram for malignant neoplasm of breast: Secondary | ICD-10-CM

## 2020-01-09 DIAGNOSIS — R002 Palpitations: Secondary | ICD-10-CM

## 2020-01-09 DIAGNOSIS — Z13820 Encounter for screening for osteoporosis: Secondary | ICD-10-CM

## 2020-01-09 DIAGNOSIS — Z8639 Personal history of other endocrine, nutritional and metabolic disease: Secondary | ICD-10-CM

## 2020-01-09 DIAGNOSIS — Z0001 Encounter for general adult medical examination with abnormal findings: Secondary | ICD-10-CM | POA: Diagnosis not present

## 2020-01-09 DIAGNOSIS — Z1211 Encounter for screening for malignant neoplasm of colon: Secondary | ICD-10-CM

## 2020-01-09 DIAGNOSIS — R739 Hyperglycemia, unspecified: Secondary | ICD-10-CM | POA: Diagnosis not present

## 2020-01-09 DIAGNOSIS — Z129 Encounter for screening for malignant neoplasm, site unspecified: Secondary | ICD-10-CM

## 2020-01-09 DIAGNOSIS — H938X3 Other specified disorders of ear, bilateral: Secondary | ICD-10-CM

## 2020-01-09 DIAGNOSIS — Z0000 Encounter for general adult medical examination without abnormal findings: Secondary | ICD-10-CM

## 2020-01-09 DIAGNOSIS — F419 Anxiety disorder, unspecified: Secondary | ICD-10-CM | POA: Diagnosis not present

## 2020-01-09 DIAGNOSIS — Z1212 Encounter for screening for malignant neoplasm of rectum: Secondary | ICD-10-CM

## 2020-01-09 DIAGNOSIS — H50012 Monocular esotropia, left eye: Secondary | ICD-10-CM

## 2020-01-09 LAB — POCT GLYCOSYLATED HEMOGLOBIN (HGB A1C): Hemoglobin A1C: 5.4 % (ref 4.0–5.6)

## 2020-01-09 LAB — GLUCOSE, CAPILLARY: Glucose-Capillary: 149 mg/dL — ABNORMAL HIGH (ref 70–99)

## 2020-01-09 MED ORDER — PREDNISOLONE ACETATE 1 % OP SUSP: 1.0000 [drp] | Freq: Two times a day (BID) | OPHTHALMIC | 0 refills | Status: DC

## 2020-01-09 MED ORDER — DEBROX 6.5 % OT SOLN: 5.0000 [drp] | Freq: Two times a day (BID) | OTIC | 0 refills | Status: AC

## 2020-01-09 MED ORDER — SYSTANE ULTRA 0.4-0.3 % OP SOLN: 1.0000 [drp] | Freq: Every day | OPHTHALMIC | 0 refills | Status: DC | PRN

## 2020-01-09 MED ORDER — HYDROXYZINE HCL 10 MG PO TABS: 10.0000 mg | ORAL_TABLET | Freq: Three times a day (TID) | ORAL | 0 refills | Status: DC | PRN

## 2020-01-09 MED ORDER — HYDROXYZINE HCL 10 MG PO TABS: 10.0000 mg | ORAL_TABLET | Freq: Every evening | ORAL | 0 refills | Status: DC | PRN

## 2020-01-09 NOTE — Patient Instructions (Addendum)
Michelle Carrillo,  Me alegre de verle hoy!  Hoy hablamos de su reciente sensacin de enfermedad. Hoy nos vamos a Copywriter, advertising para las palpitaciones del corazn. Lo veremos maana para una cita de laboratorio exclusiva para controlar algunos de sus electrolitos y conteo sanguneo para ver si hay una causa. Asegrate de llamar al  [employer] odos estn limpios, no hay infeccin ni aumento de cera en los odos. Asegrese de Art therapist hisopos en los odos y introducir otros objetos en el Control and instrumentation engineer. Le he recetado gotas para los odos que puede usar en ambos odos durante los [employer].  Tambin le he recetado un medicamento para la ansiedad llamado hidroxicina 10 mg, una vez antes de [place], segn sea necesario.  Tambin he enviado referencias para oftalmologa, colonoscopia, mamografa y exploracin DEXA.  Estamos deseando verle la proxima vez. [place] a [phone] si tiene preguntas o preocupaciones. El mejor tiempo para llamar es lunes a viernes, 9:00am - 4:00pm, pero hay alguien esta a su disposcion para lo que halga falta a cualquier hora. Si necesita recambio de los [employer] , llama su farmacia una semana antes de fecha de finalizacion de la receta. La farmacia nos llamara para la solicitud.  Gracias que nos permite tomar parte en su asistencia medica. Deseamos lo mejor!  Clayburn Pert, Dr. Sanjuan Dame, MD   Michelle Carrillo,  It was a pleasure seeing you today!   Today we discussed your recent feeling of illness. We are obtaining an EKG today for the heart palpitations. We will see you tomorrow for a lab-only appointment to check on some of your electrolytes and blood count to see if there is a cause.  Make sure to call the clinic to schedule a follow-up appointment next week.  Your ears are clear, there is no infection or increased wax in the ears. Make sure to avoid using Q-tips in the ears and sticking other objects in your ear canal. I  have prescribed you ear drops that you can use in both ears over the next few days.  I have also prescribed some anxiety medicine called hydroxyzine 10mg , once at bedtime as needed.  I have also put in referrals for ophthalmology, colonoscopy, mammogram, and DEXA scan.  We look forward to seeing you next time. Please call our clinic at [phone] if you have any questions or concerns. The best time to call is Monday-Friday from 9am-4pm, but there is someone available 24/7 at the same number. If you need medication refills, please notify your pharmacy one week in advance and they will send [place] a request.  Thank you for letting us take part in your care. Wishing you the best!  Thank you, Dr. Sanjuan Dame, MD

## 2020-01-09 NOTE — Progress Notes (Signed)
   CC: fullness in ears, palpitations  HPI:  [name] is a 69 y.o. with medical history as below who presents to clinic for three days of fullness in ears and intermittent palpitations.  Please see problem-based list for further evaluation and detailed planning.  Past Medical History:  Diagnosis Date  . Anxiety   . Depression   . Herpes zoster kerstitis s/p corneal transplant 2015   . Hypertension   . Right knee osteoarthritis   . Strabismus    due to eye injury as a child  . Stroke [facility])    Review of Systems:  As per HPI  Physical Exam:  Vitals:   01/09/20 1555  BP: 121/73  Pulse: 94  Temp: 98 F (36.7 C)  TempSrc: Oral  SpO2: 94%  Weight: 162 lb 9.6 oz (73.8 kg)  Height: 4\' 11"  (1.499 m)   General: Pleasant, sitting in chair, no acute distress HENT: Tympanic membranes clear, no bulging or erythema bilaterally. Minimal cerumen bilaterally, no impaction. Canal and auricle non-tender, non-erythematous bilaterally. Mastoid non-tender, non-erythematous bilaterally. CV: Regular rate, rhythm. No murmurs, gallops, rubs.  Pulm: Clear to auscultation bilaterally. No wheezing, rales. Neuro: Cranial nerves in tact. 5/5 strength all extremities. Sensation in tact.  Assessment & Plan:   See Encounters Tab for problem based charting.  Patient seen with Dr. Daryll Drown

## 2020-01-10 ENCOUNTER — Other Ambulatory Visit (INDEPENDENT_AMBULATORY_CARE_PROVIDER_SITE_OTHER): Payer: Medicare Other

## 2020-01-10 DIAGNOSIS — R002 Palpitations: Secondary | ICD-10-CM | POA: Diagnosis not present

## 2020-01-11 DIAGNOSIS — R002 Palpitations: Secondary | ICD-10-CM | POA: Insufficient documentation

## 2020-01-11 DIAGNOSIS — H938X3 Other specified disorders of ear, bilateral: Secondary | ICD-10-CM | POA: Insufficient documentation

## 2020-01-11 LAB — BMP8+ANION GAP
Anion Gap: 13 mmol/L (ref 10.0–18.0)
BUN/Creatinine Ratio: 14 (ref 12–28)
BUN: 11 mg/dL (ref 8–27)
CO2: 27 mmol/L (ref 20–29)
Calcium: 9.5 mg/dL (ref 8.7–10.3)
Chloride: 101 mmol/L (ref 96–106)
Creatinine, Ser: 0.76 mg/dL (ref 0.57–1.00)
GFR calc Af Amer: 94 mL/min/{1.73_m2} (ref >59)
GFR calc non Af Amer: 81 mL/min/{1.73_m2} (ref >59)
Glucose: 107 mg/dL — ABNORMAL HIGH (ref 65–99)
Potassium: 5.6 mmol/L — ABNORMAL HIGH (ref 3.5–5.2)
Sodium: 141 mmol/L (ref 134–144)

## 2020-01-11 LAB — CBC
Hematocrit: 39.7 % (ref 34.0–46.6)
Hemoglobin: 13.3 g/dL (ref 11.1–15.9)
MCH: 30.8 pg (ref 26.6–33.0)
MCHC: 33.5 g/dL (ref 31.5–35.7)
MCV: 92 fL (ref 79–97)
Platelets: 336 10*3/uL (ref 150–450)
RBC: 4.32 x10E6/uL (ref 3.77–5.28)
RDW: 13.6 % (ref 11.7–15.4)
WBC: 7.7 10*3/uL (ref 3.4–10.8)

## 2020-01-11 LAB — TSH: TSH: 4.1 u[IU]/mL (ref 0.450–4.500)

## 2020-01-11 NOTE — Assessment & Plan Note (Signed)
Patient received influenza vaccine today in clinic. Previously received COVID-19 vaccines. Placing referral for colonoscopy, mammogram, and DEXA scan. Patient also requests referral for ophthalmology for chronic eye condition.

## 2020-01-11 NOTE — Assessment & Plan Note (Signed)
Patient mentions she has a few episodes of racing heart beat over the last few days. Mentions they have occurred after taking a walk. Says this is associated with left arm weakness. She says it feels like her blood pressure is elevated during these episodes. States this feeling subsides after an hour laying down. Denies chest pain, shortness of breath, blurry vision, headaches, abdominal pain.  A/P: -Given her recent history of intracranial hemorrhage and palpitations associated with exertion and left arm weakness, EKG performed in clinic. EKG revealed normal sinus rhythm, no ST changes or signs of recent infarction. It appears patient has long history of anxiety as well, which could be contributing.   -Start patient on hydroxyzine 10mg  once at bedtime.  -CBC, BMP, TSH pending -Will follow-up with patient in clinic in one week

## 2020-01-11 NOTE — Assessment & Plan Note (Signed)
Patient states she has had sensation of ear fullness bilaterally over the past three days. Says it feels very uncomfortable and denies previous similar sensation. States she has used some Q-tips over the past few days. She is worried this is a sign of a stroke. Denies fever, hearing loss, ringing in ears, ear pain, sore throat, runny nose.  A/P: -Tympanic membranes clear, no sign of infection or impaction.  -Discussed with patient this is not a typical sign of a stroke. Encouraged patient to not use Q-tips or any other device in ears. -Start Debrox drops bilaterally, two times per day for four days.

## 2020-01-14 ENCOUNTER — Ambulatory Visit (INDEPENDENT_AMBULATORY_CARE_PROVIDER_SITE_OTHER): Payer: Medicare Other | Admitting: Student

## 2020-01-14 ENCOUNTER — Ambulatory Visit (INDEPENDENT_AMBULATORY_CARE_PROVIDER_SITE_OTHER): Payer: Medicare Other | Admitting: Adult Health

## 2020-01-14 ENCOUNTER — Encounter: Payer: Self-pay | Admitting: Student

## 2020-01-14 ENCOUNTER — Other Ambulatory Visit: Payer: Self-pay

## 2020-01-14 ENCOUNTER — Encounter: Payer: Self-pay | Admitting: Adult Health

## 2020-01-14 VITALS — BP 132/68 | HR 80 | Ht 59.0 in | Wt 163.0 lb

## 2020-01-14 VITALS — BP 123/67 | HR 82 | Temp 98.6°F | Ht 59.0 in | Wt 163.1 lb

## 2020-01-14 DIAGNOSIS — F419 Anxiety disorder, unspecified: Secondary | ICD-10-CM | POA: Diagnosis not present

## 2020-01-14 DIAGNOSIS — H938X3 Other specified disorders of ear, bilateral: Secondary | ICD-10-CM

## 2020-01-14 DIAGNOSIS — I10 Essential (primary) hypertension: Secondary | ICD-10-CM

## 2020-01-14 DIAGNOSIS — R5382 Chronic fatigue, unspecified: Secondary | ICD-10-CM

## 2020-01-14 DIAGNOSIS — Z9889 Other specified postprocedural states: Secondary | ICD-10-CM

## 2020-01-14 DIAGNOSIS — I611 Nontraumatic intracerebral hemorrhage in hemisphere, cortical: Secondary | ICD-10-CM

## 2020-01-14 DIAGNOSIS — F32A Depression, unspecified: Secondary | ICD-10-CM

## 2020-01-14 DIAGNOSIS — E875 Hyperkalemia: Secondary | ICD-10-CM | POA: Diagnosis not present

## 2020-01-14 DIAGNOSIS — E785 Hyperlipidemia, unspecified: Secondary | ICD-10-CM | POA: Diagnosis not present

## 2020-01-14 DIAGNOSIS — D518 Other vitamin B12 deficiency anemias: Secondary | ICD-10-CM

## 2020-01-14 DIAGNOSIS — R1011 Right upper quadrant pain: Secondary | ICD-10-CM | POA: Diagnosis not present

## 2020-01-14 DIAGNOSIS — I69319 Unspecified symptoms and signs involving cognitive functions following cerebral infarction: Secondary | ICD-10-CM | POA: Diagnosis not present

## 2020-01-14 DIAGNOSIS — H547 Unspecified visual loss: Secondary | ICD-10-CM

## 2020-01-14 DIAGNOSIS — G8929 Other chronic pain: Secondary | ICD-10-CM | POA: Diagnosis not present

## 2020-01-14 DIAGNOSIS — E559 Vitamin D deficiency, unspecified: Secondary | ICD-10-CM

## 2020-01-14 NOTE — Progress Notes (Signed)
I agree with the above plan 

## 2020-01-14 NOTE — Assessment & Plan Note (Signed)
Patient endorses years of intermittent, mild RUQ abdominal tenderness without other associated symptoms. Denies history of known liver or gallbladder disease. Unremarkable abdominal exam although patient had no pain during interview today. Most likely due to biliary colic in setting of choledocholithiasis. Consider chronic cholecystitis although Murphy's sign negative. Less likely liver disease given chronicity without physical stigmata.  - Will check CMP for bilirubin and LFT/ALP - Will hold off on RUQ ultrasound unless pain continues or worsens or labs are concerning given financial concerns of patient

## 2020-01-14 NOTE — Assessment & Plan Note (Signed)
Patient was last seen 6 days ago with palpitations with reassuring EKG and history/physical. She was started on hydroxyzine 10mg  daily at bedtime PRN and her anxiety has significantly improved.  - Continue hydroxyzine 10mg  daily at bedtime PRN

## 2020-01-14 NOTE — Assessment & Plan Note (Signed)
Patient continues to endorse ear fullness without pain or tinnitus in bilateral ears. She has not yet picked up Debrox drops. TM's clear bilaterally without effusion or impaction on exam. No clear cause currently. - Patient likely would not benefit much from Debrox  - Discontinue Q-tip use in ear canal - Will hold off on Debrox and continue to monitor

## 2020-01-14 NOTE — Progress Notes (Signed)
Internal Medicine Clinic Attending  I saw and evaluated the patient.  I personally confirmed the key portions of the history and exam documented by Dr. Collene Gobble and I reviewed pertinent patient test results.  The assessment, diagnosis, and plan were formulated together and I agree with the documentation in the resident's note.   Patient noted to have a high K on labs.  Will need management and possibly change of medications.  Patient due to be seen on 10/4 in clinic.

## 2020-01-14 NOTE — Patient Instructions (Addendum)
[name],  [name], discutimos que su dolor abdominal probablemente se deba a una enfermedad de la vescula biliar y menos probablemente a una enfermedad heptica. Sin embargo, existen otras causas que pueden crear un dolor similar.  Su potasio estaba alto en su ltima visita.  Hoy, volveremos a verificar sus niveles de potasio, verificaremos sus otros electrolitos y observaremos su funcin heptica. Tambin controlar sus niveles de vitamina D y vitamina B12 dada su fatiga.  No es necesario que recoja las gotas para los odos si an no las ha pagado.  Lo llamar para informarle los resultados de su anlisis de sangre y postergaremos la obtencin de imgenes de [place] de su hgado y Warehouse manager biliar a menos que su dolor contine o empeore o su anlisis de laboratorio sea preocupante.  Si tiene alguna pregunta, no dude en llamar al [phone].  Planee hacer un seguimiento en 3 meses, o antes si tiene alguna inquietud o escucha lo contrario cuando llame con sus resultados.  Clayburn Pert,  Dr. Konrad Penta  ----------------------------------------------------------------------------------   [name],  Today, we discussed that your abdominal pain is most likely due to gallbladder disease and less likely due to liver disease. There are other causes that can create similar pain, however.  Your potassium was high on your last visit.  Today, we will recheck your potassium levels, will check your other electrolytes, and will look at your liver function. I will also check your vitamin D and vitamin B12 levels given your fatigue.   You do not need to pick up your ear drops if you have not already paid for them.  I will call you with results of your blood work and we will hold off on getting ultrasound pictures of your liver and gallbladder unless your pain continues or worsens or your lab work is concerning.  Please don't hesitate to call with any questions [phone].  Please plan to follow up in 3 months, or sooner if you have any concerns or you hear otherwise when I call with your results.  Thank you,  Dr. Konrad Penta

## 2020-01-14 NOTE — Progress Notes (Signed)
   CC: Abdominal Pain  HPI:  Michelle Carrillo is a 69 y.o. F s/p recent craniotomy with evacuation of recent SDH, HTN, HLD, anxiety and depression presenting for follow up after recent clinic visit 6 days ago. Her primary complaint is of right upper quadrant abdominal pain. She currently does not have this pain but states it has been present intermittently over the past several years. It occasionally is worsened by eating, especially spicy foods. She had last been seen for ear fullness and palpitations. Her palpitations have resolved with hydroxyzine, but she continues to endorse ear fullness. She denies ear pain or tinnitus. She denies any other abdominal pain, nausea, vomiting, changes in bowel habits, dark urine, dysuria, chest pain, heartburn, or any other symptoms. Her daughter notes that she has been mildly confused since her stroke with forgetfulness and states that she has chronic fatigue, but states her mother's depression has improved since she was started on Paxil recently.   Patient's daughter served as Patent attorney per patient's request, although professional interpreter services were offered.  Past Medical History:  Diagnosis Date  . Anxiety   . Depression   . Herpes zoster kerstitis s/p corneal transplant 2015   . Hypertension   . Right knee osteoarthritis   . Strabismus    due to eye injury as a child  . Stroke [facility])    Social Hx: Patient lives at home with her daughter. She is functioning more independently since her stroke.  Review of Systems:  Occasional lower back pain present. Ten point review of systems negative except as noted above in HPI.   Physical Exam:  Vitals:   01/14/20 1529  BP: 123/67  Pulse: 82  Temp: 98.6 F (37 C)  TempSrc: Oral  SpO2: 96%  Weight: 163 lb 1.6 oz (74 kg)  Height: 4\' 11"  (1.499 m)   General: Patient appears well. No acute distress. Eyes: Sclera non-icteric. No conjunctival injection.  HENT: No nasal discharge. No  bulging or erythema of bilateral TM's, which are pearly and grey. There is minimal cerumen along medial canal without any significant impaction. Respiratory: Lungs are CTA, bilaterally. No wheezes, rales, or rhonchi.  Cardiovascular: Regular rate and rhythm. No murmurs, rubs, or gallops. There is trace upper extremity non-pitting edema but no lower extremity edema. Musculoskeletal: There is mild, non-pitting edema of the RLE about the medial knee joint, without other lower extremity swelling or TTP.  Abdominal: Soft and non-tender to palpation. Bowel sounds intact. No rebound or guarding. Negative Murphy's sign.  Skin: No lesions. No rashes.  Psych: Normal affect. Normal tone of voice.   Assessment & Plan:   See Encounters Tab for problem based charting.  Patient seen with Dr. Jimmye Norman.  Jeralyn Bennett, MD 01/14/2020, 5:30 PM Pager: [phone]

## 2020-01-14 NOTE — Assessment & Plan Note (Signed)
Potassium from last BMP was 5.6, with normal potassium levels in the past. Consider hemolyzed sample. On no medications that would contribute to hyperkalemia. - Will check CMP and give Lokelma if potassium remains elevated - Will check magnesium

## 2020-01-14 NOTE — Progress Notes (Signed)
[facility] [address] [place]. [place] [address] [phone]       OFFICE FOLLOW-UP NOTE  [name] Date of Birth:  Oct 10, 1950 Medical Record Number:  [id number]    Chief Complaint  Patient presents with  . Follow-up    tx rm  . Cerebrovascular Accident    pt said she has no sx since she has the stroke      HPI:   Today, 01/14/2020, [name] returns for 60-month stroke follow-up accompanied by her daughter who assists with interpretation. Does report continued cognitive impairment such as difficulty concentrating and short-term memory loss and occasional word finding difficulty. Completed HH SLP - daughter questioning if additional outpatient therapy would be beneficial. She continues to live with her husband and 2 daughters. Maintains ADLs independently but does need assistance with IADLs such as bill paying and cooking.  Daughter reports visual concerns post stroke such as bumping into different objects currently being followed by ophthalmology with extensive ophthalmologic history.  Denies diplopia or blurred vision but possibly right peripheral impairment.  Denies new or worsening stroke/TIA symptoms.  Repeat CT head showed resolution of prior ICH.  EEG showed no evidence of seizure activity.  Keppra discontinued after slow titration and denies seizure activity or symptoms.  Blood pressure today 132/68. Monitors at home which has been stable.  Continues to have issues with depression/anxiety currently being managed by PCP.  No further concerns at this time.    History provided for reference purposes only Initial visit 10/09/2019 Dr. Phillips Climes. Michelle Carrillo is a pleasant 69 year old Hispanic lady who is accompanied today by her daughter as well as Spanish language interpreter Davy Pique who translates for her.  History is obtained from them, review of electronic medical records and I personally reviewed imaging films in PACS.  She has past  medical history of hypertension, anxiety and depression who presented to [facility] emergency room on 08/09/2019 with altered mental status.  CT scan of the head showed a large moderate size cortically based hematoma in the left parietal region with extension into the subdural space resulting in a fair sized subdural hematoma with left-to-right midline shift.  She had neurological worsening on exam requiring emergent neurosurgical consultation and patient underwent craniotomy for evacuation of the subdural hematoma.  She was kept in the intensive care unit and blood pressure was tightly controlled.  She was started on hypertonic saline.  Follow-up CT scan showed gradual improvement of her rightward midline shift from 7 mm down.  2D echo showed normal ejection fraction without cardiac source of embolism.  EEG shows no seizure activity but she was started on Keppra for seizure prophylaxis.  Repeat CT on the day of discharge on 08/23/2019 showed reduction in midline shift from 11 to 8 mm.  Patient was found to have mild aphasia on exam but otherwise no focal deficits.  She was transferred to inpatient rehab to [facility] in [place] as there was no beds available at [facility] inpatient rehab.  Patient was there for 3 weeks and is presently living at home with her daughter.  Her speech is mostly improved though she has some word hesitancy and forgets occasional words.  She is mostly fluent when she speaks.  Her understanding is also improving though it is not back to normal.  The patient's blood pressure has been well controlled and today it is 140/76.  She complains of some intermittent dizziness as well as anxiety.  She complains of some tingling on her head  on the surgical site.  This is intermittent and not very bothersome.  She is currently getting home speech therapy.  She was started on Zoloft 25 mg by primary care physician recently but daughter feels it does not help and perhaps may have caused new side  effects.    ROS:   14 system review of systems is positive for those listed in HPI and all other systems negative  PMH:  Past Medical History:  Diagnosis Date  . Anxiety   . Depression   . Herpes zoster kerstitis s/p corneal transplant 2015   . Hypertension   . Right knee osteoarthritis   . Strabismus    due to eye injury as a child  . Stroke [facility])     Social History:  Social History   Socioeconomic History  . Marital status: Married    Spouse name: Not on file  . Number of children: Not on file  . Years of education: Not on file  . Highest education level: Not on file  Occupational History  . Not on file  Tobacco Use  . Smoking status: Never Smoker  . Smokeless tobacco: Never Used  Substance and Sexual Activity  . Alcohol use: Never  . Drug use: Never  . Sexual activity: Not on file  Other Topics Concern  . Not on file  Social History Narrative  . Not on file   Social Determinants of Health   Financial Resource Strain:   . Difficulty of Paying Living Expenses: Not on file  Food Insecurity:   . Worried About Charity fundraiser in the Last Year: Not on file  . Ran Out of Food in the Last Year: Not on file  Transportation Needs:   . Lack of Transportation (Medical): Not on file  . Lack of Transportation (Non-Medical): Not on file  Physical Activity:   . Days of Exercise per Week: Not on file  . Minutes of Exercise per Session: Not on file  Stress:   . Feeling of Stress : Not on file  Social Connections:   . Frequency of Communication with Friends and Family: Not on file  . Frequency of Social Gatherings with Friends and Family: Not on file  . Attends Religious Services: Not on file  . Active Member of Clubs or Organizations: Not on file  . Attends Archivist Meetings: Not on file  . Marital Status: Not on file  Intimate Partner Violence:   . Fear of Current or Ex-Partner: Not on file  . Emotionally Abused: Not on file  . Physically Abused:  Not on file  . Sexually Abused: Not on file    Medications:   Current Outpatient Medications on File Prior to Visit  Medication Sig Dispense Refill  . amLODipine (NORVASC) 10 MG tablet Take 1 tablet (10 mg total) by mouth daily. 90 tablet 3  . atorvastatin (LIPITOR) 10 MG tablet Take by mouth.    . feeding supplement, ENSURE ENLIVE, (ENSURE ENLIVE) LIQD Take 237 mLs by mouth 3 (three) times daily between meals. 237 mL 12  . hydrOXYzine (ATARAX/VISTARIL) 10 MG tablet Take 1 tablet (10 mg total) by mouth at bedtime as needed. 30 tablet 0  . levETIRAcetam (KEPPRA) 250 MG tablet Take 1 tablet by mouth twice daily 60 tablet 0  . lisinopril (ZESTRIL) 20 MG tablet Take 1 tablet (20 mg total) by mouth daily. 90 tablet 3  . Multiple Vitamin (MULTIVITAMIN WITH MINERALS) TABS tablet Take 1 tablet by mouth daily.    Marland Kitchen  PARoxetine (PAXIL) 20 MG tablet Take 1 tablet (20 mg total) by mouth daily. 90 tablet 3  . Polyethyl Glycol-Propyl Glycol (SYSTANE ULTRA) 0.4-0.3 % SOLN Apply 1-2 drops to eye daily as needed. 20 mL 0  . prednisoLONE acetate (PRED FORTE) 1 % ophthalmic suspension Place 1 drop into both eyes in the morning and at bedtime. 10 mL 0  . senna-docusate (SENOKOT-S) 8.6-50 MG tablet Take 1 tablet by mouth 2 (two) times daily.     No current facility-administered medications on file prior to visit.    Allergies:  No Known Allergies  Physical Exam Today's Vitals   01/14/20 0840  BP: 132/68  Pulse: 80  Weight: 163 lb (73.9 kg)  Height: 4\' 11"  (1.499 m)   Body mass index is 32.92 kg/m.  General: well developed, well nourished pleasant Spanish-speaking middle-aged Hispanic lady, seated, in no evident distress Head: head normocephalic and craniotomy scar noted in the left parietal region. Neck: supple with no carotid or supraclavicular bruits Cardiovascular: regular rate and rhythm, no murmurs Musculoskeletal: no deformity Skin:  no rash/petichiae Vascular:  Normal pulses all extremities   Neurologic Exam  Mental Status: Awake and fully alert.  Speech and language difficulty fully assessing as patient is Spanish-speaking only.  Subjective occasional word finding difficulty.  Oriented to place and time. Recent memory subjectively impaired and remote memory intact. Attention span, concentration and fund of knowledge appropriate during visit. Mood and affect appropriate.  Cranial Nerves: Pupils equal, briskly reactive to light. Extraocular movements full without nystagmus. Visual fields difficulty fully assessing but appears to have deficit within right periphery and left lower periphery.  Mild left eye proptosis.  Hearing intact. Facial sensation intact.  Mild right lower facial asymmetry when she smiles., tongue, palate moves normally and symmetrically.  Motor: Normal bulk and tone. Normal strength in all tested extremity muscles. Sensory.: intact to touch ,pinprick .position and vibratory sensation.  Coordination: Rapid alternating movements normal in all extremities. Finger-to-nose and heel-to-shin performed accurately bilaterally. Gait and Station: Arises from chair without difficulty. Stance is normal. Gait demonstrates normal stride length and balance . Able to heel, toe and tandem walk without difficulty.  Reflexes: 1+ and symmetric. Toes downgoing.       ASSESSMENT/PLAN: 69 year old lady with left parietal large intracerebral hemorrhage likely of hypertensive etiology with left-sided acute subdural hematoma with cytotoxic edema and midline shift requiring craniotomy in April 2021 placed on prophylactic AED which has since been discontinued without seizure symptoms or activity    L PARIETAL ICH -Residual cognitive impairment and word finding difficulty.  Referral placed to neuro rehab SLP for further evaluation and hopeful further improvement.  -Possible visual impairment post stroke vs underlying extensive ophthalmologic conditions.  Advised to continue to follow with  ophthalmology and to hold off at this time on neuro-ophthalmology evaluation as this will likely not provide any additional benefit during this interval time as currently being evaluated further by ophthalmology for history of transplanted cornea, irregular astigmatism of left eye, cataract, strabismus and ptosis -Repeat CT head 10/16/2019 showed resolution of prior ICH with residual encephalomalacia -No indication for antithrombotic as no prior ischemic stroke history or cardiac history -Continue atorvastatin 10 mg daily for secondary stroke prevention managed by PCP -Maintain strict control of HTN with BP goal <130/90 and HLD with LDL goal<70 -Continue to follow with PCP for depression/anxiety monitoring and management   Follow-up in 6 months or call earlier if needed   I spent 35 minutes of face-to-face and non-face-to-face time  with patient and daughter assisting with interpretation.  This included previsit chart review, lab review, study review, order entry, electronic health record documentation, patient and daughter education and discussion regarding prior stroke, residual deficits, visual concerns, importance of managing stroke risk factors, secondary stroke prevention measures and answered all other questions to patient and daughter satisfaction  Frann Rider, AGNP-BC  [facility] [address] [place], [place] [address]  Phone [phone] Fax [phone] Note: This document was prepared with digital dictation and possible smart phrase technology. Any transcriptional errors that result from this process are unintentional.

## 2020-01-14 NOTE — Assessment & Plan Note (Signed)
Patient has a history of HTN with recent ICH, thought to be related to chronic hypertension. Blood pressure today at goal <130/90 on amlodipine and lisinopril.  - Continue amlodipine 10mg  daily - Continue lisinopril 20mg  daily

## 2020-01-14 NOTE — Assessment & Plan Note (Signed)
Patient has a history of moderate, recurrent MDD and was restarted on Paxil 20mg  daily a couple months ago. She and her daughter state her mood has significantly improved although she does continue to be fatigued during the day. PHQ9 improved from 10 to 8 today. TSH WNL. - continue Paxil 20mg  daily; consider up-titrating at future visit  - Will check vitamin B12 and Vitamin D levels

## 2020-01-14 NOTE — Patient Instructions (Addendum)
Referral placed to neuro rehab speech therapy - please call office on Wednesday to schedule appointment  Continue atorvastatin 10 mg daily for secondary stroke prevention  Continue to follow with eye doctor for visual concerns - would recommend holding off on neuro eye doctor evaluation until underlying conditions treated/further evaluated   Continue to follow up with PCP regarding cholesterol and blood pressure management  Maintain strict control of hypertension with blood pressure goal below 130/90 and cholesterol with LDL cholesterol (bad cholesterol) goal below 70 mg/dL.      Followup in the future with me in 6 month or call earlier if needed      Thank you for coming to see [place] at [facility]. I hope we have been able to provide you high quality care today.  You may receive a patient satisfaction survey over the next few weeks. We would appreciate your feedback and comments so that we may continue to improve ourselves and the health of our patients.

## 2020-01-14 NOTE — Assessment & Plan Note (Signed)
Patient with hx ICH is taking Atorvastatin 10mg  daily. She has not had lipid testing documented in > 1 year.  - Will recheck lipid panel today

## 2020-01-15 ENCOUNTER — Telehealth: Payer: Self-pay | Admitting: Student

## 2020-01-15 LAB — LIPID PANEL
Chol/HDL Ratio: 4.2 ratio (ref 0.0–4.4)
Cholesterol, Total: 209 mg/dL — ABNORMAL HIGH (ref 100–199)
HDL: 50 mg/dL (ref >39)
LDL Chol Calc (NIH): 114 mg/dL — ABNORMAL HIGH (ref 0–99)
Triglycerides: 259 mg/dL — ABNORMAL HIGH (ref 0–149)
VLDL Cholesterol Cal: 45 mg/dL — ABNORMAL HIGH (ref 5–40)

## 2020-01-15 LAB — CMP14 + ANION GAP
ALT: 19 IU/L (ref 0–32)
AST: 19 IU/L (ref 0–40)
Albumin/Globulin Ratio: 1.1 — ABNORMAL LOW (ref 1.2–2.2)
Albumin: 3.9 g/dL (ref 3.8–4.8)
Alkaline Phosphatase: 155 IU/L — ABNORMAL HIGH (ref 44–121)
Anion Gap: 17 mmol/L (ref 10.0–18.0)
BUN/Creatinine Ratio: 26 (ref 12–28)
BUN: 15 mg/dL (ref 8–27)
Bilirubin Total: <0.2 mg/dL (ref 0.0–1.2)
CO2: 22 mmol/L (ref 20–29)
Calcium: 9.4 mg/dL (ref 8.7–10.3)
Chloride: 102 mmol/L (ref 96–106)
Creatinine, Ser: 0.58 mg/dL (ref 0.57–1.00)
GFR calc Af Amer: 110 mL/min/{1.73_m2} (ref >59)
GFR calc non Af Amer: 96 mL/min/{1.73_m2} (ref >59)
Globulin, Total: 3.4 g/dL (ref 1.5–4.5)
Glucose: 101 mg/dL — ABNORMAL HIGH (ref 65–99)
Potassium: 4.7 mmol/L (ref 3.5–5.2)
Sodium: 141 mmol/L (ref 134–144)
Total Protein: 7.3 g/dL (ref 6.0–8.5)

## 2020-01-15 LAB — MAGNESIUM: Magnesium: 2.1 mg/dL (ref 1.6–2.3)

## 2020-01-15 LAB — VITAMIN D 25 HYDROXY (VIT D DEFICIENCY, FRACTURES): Vit D, 25-Hydroxy: 26 ng/mL — ABNORMAL LOW (ref 30.0–100.0)

## 2020-01-15 LAB — VITAMIN B12: Vitamin B-12: 665 pg/mL (ref 232–1245)

## 2020-01-15 NOTE — Telephone Encounter (Signed)
Spanish Interpreter services were used to call [name] to discuss her lab results. Patient did not answer, although voicemail was left, informing her that her cholesterol levels are elevated and that I am prescribing Atorvastatin 40mg  daily to her pharmacy. Her vitamin D levels were also slightly low, and I informed her she should pick up over the counter vitamin D supplements and take 1056mcg daily. I informed her her liver function looked okay although slightly elevated and will have her schedule an appointment in 3 months to repeat bloodwork.   Jeralyn Bennett, PGY1  Internal Medicine [phone]

## 2020-01-15 NOTE — Progress Notes (Signed)
Internal Medicine Clinic Attending  I saw and evaluated the patient.  I personally confirmed the key portions of the history and exam documented by Dr. Konrad Penta and I reviewed pertinent patient test results.  The assessment, diagnosis, and plan were formulated together and I agree with the documentation in the resident's note.  On repeat exam and history, patient feels that her RUQ pain is in the abdominal wall rather than deep inside, which would make a biliary source less likely, though a hepatobiliary or GI source of discomfort would be more likely to be influenced by food intake, which her discomfort is described to be.  Because the discomfort is mild and infrequent, will monitor for now.

## 2020-01-16 ENCOUNTER — Encounter: Payer: Self-pay | Admitting: Adult Health

## 2020-01-22 ENCOUNTER — Encounter: Payer: Self-pay | Admitting: *Deleted

## 2020-01-30 ENCOUNTER — Encounter: Payer: Self-pay | Admitting: *Deleted

## 2020-02-18 ENCOUNTER — Ambulatory Visit: Payer: Medicare Other

## 2020-02-18 ENCOUNTER — Ambulatory Visit: Payer: Medicare Other | Attending: Adult Health | Admitting: Speech Pathology

## 2020-02-18 ENCOUNTER — Other Ambulatory Visit: Payer: Self-pay

## 2020-02-18 DIAGNOSIS — R2681 Unsteadiness on feet: Secondary | ICD-10-CM | POA: Diagnosis not present

## 2020-02-18 DIAGNOSIS — M6281 Muscle weakness (generalized): Secondary | ICD-10-CM

## 2020-02-18 DIAGNOSIS — R2689 Other abnormalities of gait and mobility: Secondary | ICD-10-CM | POA: Diagnosis not present

## 2020-02-18 DIAGNOSIS — R41841 Cognitive communication deficit: Secondary | ICD-10-CM

## 2020-02-18 NOTE — Therapy (Signed)
[facility] [address] [place], [place], [address] Phone: [phone]   Fax:  [phone]  Physical Therapy Evaluation  Patient Details  Name: Michelle Carrillo MRN: [id number] Date of Birth: April 24, 1950 Referring Provider (PT): Referred by Joni Reining, DO. Followed by Frann Rider, NP   Encounter Date: 02/18/2020   PT End of Session - 02/18/20 1447    Visit Number 1    Number of Visits 17    Date for PT Re-Evaluation 05/18/20   POC for 8 weeks, Cert for 90 days   Authorization Type Medicare (10th Visit PN)    Progress Note Due on Visit 10    PT Start Time 1403    PT Stop Time 1447    PT Time Calculation (min) 44 min    Equipment Utilized During Treatment Gait belt    Activity Tolerance Patient tolerated treatment well    Behavior During Therapy WFL for tasks assessed/performed           Past Medical History:  Diagnosis Date  . Anxiety   . Depression   . Herpes zoster kerstitis s/p corneal transplant 2015   . Hypertension   . Right knee osteoarthritis   . Strabismus    due to eye injury as a child  . Stroke [facility])     Past Surgical History:  Procedure Laterality Date  . CORNEAL TRANSPLANT Left    2006 and 2016  . CRANIOTOMY Left 08/09/2019   Procedure: CRANIOTOMY HEMATOMA EVACUATION SUBDURAL;  Surgeon: Ashok Pall, MD;  Location: [facility];  Service: Neurosurgery;  Laterality: Left;  left    There were no vitals filed for this visit.    Subjective Assessment - 02/18/20 1404    Subjective Presented to ED on 08/09/19 with altered mental status. CT scan of the head showed a large moderate size cortically based hematoma in the left parietal region with extension into the subdural space resulting in a fair sized subdural hematoma with left-to-right midline shift. Patient recieved inpatient rehab services and was discharged home. Daughter reports she does use RW/cart for balance when walking in  community/shopping. No AD use in the house. Daughter/patient reports that she has had no recent falls, but is very careful to avoid falling. Daughter reports changes to L visual field, as she often bumps into things or does not eat things on the L side of her plate.    Patient is accompained by: Family member   Daughter [name])   Pertinent History HTN, Anxiety, Depression, OA, Strabismus    Limitations Walking    Patient Stated Goals walk better    Currently in Pain? No/denies              [facility] PT Assessment - 02/18/20 1413      Assessment   Medical Diagnosis Nontraumatic L Cortical Hemorrhage    Referring Provider (PT) Referred by Joni Reining, DO. Followed by Frann Rider, NP    Onset Date/Surgical Date 08/09/19    Hand Dominance Right    Prior Therapy Inpatient Rehab, [facility] Therapy      Precautions   Precautions Fall    Precaution Comments Interpreter Needed.     Required Braces or Orthoses Other Brace/Splint    Other Brace/Splint Patient sometimes wears knee brace due to R Knee Pain      Balance Screen   Has the patient fallen in the past 6 months No    Has the patient had a decrease in activity level because  of a fear of falling?  Yes    Is the patient reluctant to leave their home because of a fear of falling?  No      Home Social worker Private residence    Living Arrangements Spouse/significant other;Children    Available Help at Discharge Family    Type of [place] to enter    Entrance Stairs-Number of Steps 3-4    Entrance Stairs-Rails None    Home Layout One level    [place] - single point;Walker - 4 wheels;Shower seat      Prior Function   Level of Independence Independent    Vocation --   Stay at [employer]   Overall Cognitive Status Impaired/Different from baseline    Memory Decreased recall of precautions;Decreased short-term memory    Following Commands Follows one step commands  consistently;Follows multi-step commands inconsistently    Following Command Comments with completion of Berg Balance dififculty with following multi step commands noted      Observation/Other Assessments   Observations Daughter reporting that patient only finished 3rd grade and needed assistance with some activites inlcuding medication management at baseline.       Sensation   Light Touch Appears Intact      Coordination   Gross Motor Movements are Fluid and Coordinated No    Coordination and Movement Description decreased coordination noted in RLE      Tone   Assessment Location Right Lower Extremity;Left Lower Extremity;Other (comment)      Tone Assessment - Other   Other Tone Location Comments Tone difficult to assess due to patient difficulty to relax BLE      ROM / Strength   AROM / PROM / Strength AROM;Strength      AROM   Overall AROM  Deficits    Overall AROM Comments due to decreased strength in BLE's      Strength   Overall Strength Deficits    Strength Assessment Site Hip;Knee;Ankle    Right/Left Hip Right;Left    Right Hip Flexion 3+/5    Right Hip ABduction 3+/5    Right Hip ADduction 3/5    Left Hip Flexion 4-/5    Left Hip ABduction 4-/5    Left Hip ADduction 4-/5    Right/Left Knee Right;Left    Right Knee Flexion 3+/5    Right Knee Extension 3+/5    Left Knee Flexion 4-/5    Left Knee Extension 3+/5    Right/Left Ankle Right;Left    Right Ankle Dorsiflexion 3-/5    Left Ankle Dorsiflexion 4-/5      Bed Mobility   Bed Mobility Rolling Right;Rolling Left;Supine to Sit;Sit to Supine    Rolling Right Independent    Rolling Left Independent    Supine to Sit Independent    Sit to Supine Independent      Transfers   Transfers Sit to Stand;Stand to Sit    Sit to Stand 5: Supervision    Sit to Stand Details Verbal cues for sequencing;Verbal cues for technique    Five time sit to stand comments  16.90 secs w/o UE support from standard height chair     Stand to Sit 5: Supervision      Ambulation/Gait   Ambulation/Gait Yes    Ambulation/Gait Assistance 5: Supervision;4: Min guard    Ambulation/Gait Assistance Details completed ambulation around therapy gym. Patient demo 1-2 instances of almost bumping into  objects on L side due to visual deficits requring CGA from PT to avoid obstacle. Patient demo decreased visual scanning to compensate for visual deficit    Ambulation Distance (Feet) 150 Feet    Assistive device None    Gait Pattern Within Functional Limits    Ambulation Surface Level;Indoor    Gait velocity 14.97 secs = 2.19 ft/sec    Stairs Yes    Stairs Assistance 5: Supervision;4: Min guard    Stairs Assistance Details (indicate cue type and reason) patient ascending without rail and step to pattern, descend stairs with single rail and step to pattern. increased balance challenge noted with descent    Stair Management Technique One rail Left;Step to pattern;Forwards    Number of Stairs 4    Height of Stairs 6      Standardized Balance Assessment   Standardized Balance Assessment Berg Balance Test;Timed Up and Go Test      Berg Balance Test   Sit to Stand Able to stand without using hands and stabilize independently    Standing Unsupported Able to stand safely 2 minutes    Sitting with Back Unsupported but Feet Supported on Floor or Stool Able to sit safely and securely 2 minutes    Stand to Sit Sits safely with minimal use of hands    Transfers Able to transfer safely, minor use of hands    Standing Unsupported with Eyes Closed Able to stand 10 seconds with supervision    Standing Unsupported with Feet Together Able to place feet together independently and stand for 1 minute with supervision    From Standing, Reach Forward with Outstretched Arm Can reach forward >12 cm safely (5")    From Standing Position, Pick up Object from Green to pick up shoe safely and easily    From Standing Position, Turn to Look Behind Over each  Shoulder Turn sideways only but maintains balance    Turn 360 Degrees Able to turn 360 degrees safely but slowly    Standing Unsupported, Alternately Place Feet on Step/Stool Able to stand independently and safely and complete 8 steps in 20 seconds    Standing Unsupported, One Foot in Front Needs help to step but can hold 15 seconds    Standing on One Leg Tries to lift leg/unable to hold 3 seconds but remains standing independently    Total Score 43    Berg comment: 43/56 = Significant Fall Risk      Timed Up and Go Test   TUG Normal TUG    Normal TUG (seconds) 14.32    TUG Comments w/o AD. required two attempts due to difficutly with  following commands/instructions of test.       RLE Tone   RLE Tone Within Functional Limits      LLE Tone   LLE Tone Within Functional Limits                      Objective measurements completed on examination: See above findings.               PT Education - 02/18/20 1509    Education Details Educated on POC/evaluation findings; Potential need for OT and Neuro Opthamalogy Referral    Person(s) Educated Patient;Child(ren)    Methods Explanation    Comprehension Verbalized understanding            PT Short Term Goals - 02/18/20 1811      PT SHORT TERM GOAL #1  Title patient will be independent with inital HEP for strengthening/balance (ALL STGS Due: 03/17/20)    Baseline no HEP established    Time 4    Period Weeks    Status New    Target Date 03/17/20      PT SHORT TERM GOAL #2   Title Patient will improve Berg Balance to >/= 45/56 to demonstrate reduced fall risk    Baseline 43/56    Time 4    Period Weeks    Status New      PT SHORT TERM GOAL #3   Title Patient will demonstrate ability to ambulate x 300 ft without AD and demo proper visual scanning to allow for improved safety with household/community mobility and compensate for visual deficits    Baseline no scanning; bumping into objects on L    Time 4      Period Weeks    Status New             PT Long Term Goals - 02/18/20 1814      PT LONG TERM GOAL #1   Title Patient will be independent with final HEP for strengthening/balance (ALL LTGS DUE: 04/14/2020)    Baseline no HEP established    Time 8    Period Weeks    Status New    Target Date 04/14/20      PT LONG TERM GOAL #2   Title Patient will improve TUG to </= 12 seconds without AD to demosntrate reduced fall risk    Baseline 14.32 secs    Time 8    Period Weeks    Status New      PT LONG TERM GOAL #3   Title Patient will improve gait speed to >/= 2.7 ft/sec to demonstrate improved community mobility    Baseline 2.14 ft/sec    Time 8    Period Weeks    Status New      PT LONG TERM GOAL #4   Title Patient will improve Berg Balance Score to >/= 50/56 to demonstrate reduced risk for falls and improved balance    Baseline 43/56    Time 8    Period Weeks    Status New      PT LONG TERM GOAL #5   Title Patient will be able to complete 5x sit <> stand in </= 12 seconds without UE support to demonstrate improved balance    Baseline 16.9 secs    Time 8    Period Weeks    Status New                  Plan - 02/18/20 1511    Clinical Impression Statement Patient is a 69 y.o. female that was referred to Neuro OPPT services for Nontraumatic L Cortical Hemorrhage that occured April 2021. Patient recieved inpatient rehab services and [facility] therapy. Patient's PMH is significant for the following: HTN, Anxiety, Depression, OA, Strabismus. Upon evaluation patient presents with the following impairments: decreased strength, impaired balance, impaired AROM, visual impairments, and difficulties with ambulation. Patient is currently ambulating at 2.14 ft/ sec demo limited community ambulator. With Berg Balance score of 43/56 and TUG time of 14.32 secs, patient is at an increased risk for falls. Patient demos difficulty with following multi step commands, but able to follow single  step commands consistently. Patient will benefit from skilled PT services to address impairments noted above and reduce risks for fall.    Personal Factors and Comorbidities Comorbidity 3+  Comorbidities HTN, Anxiety, Depression, OA, Strabismus    Examination-Activity Limitations Caring for Others;Locomotion Level;Stairs;Transfers    Examination-Participation Restrictions Cleaning;Community Activity;Medication Management    Stability/Clinical Decision Making Stable/Uncomplicated    Clinical Decision Making Low    Rehab Potential Good    PT Frequency 2x / week    PT Duration 8 weeks    PT Treatment/Interventions ADLs/Self Care Home Management;DME Instruction;Stair training;Gait training;Functional mobility training;Therapeutic activities;Balance training;Neuromuscular re-education;Therapeutic exercise;Patient/family education;Orthotic Fit/Training;Manual techniques;Vestibular;Passive range of motion    PT Next Visit Plan Did we recieve OT/Neuro Opthamalogy Referral? Schedule OT eval if possible. Initiate HEP for Strengthing/Balance.    Recommended Other Services Speech Therapy, Occupational Therapy    Consulted and Agree with Plan of Care Family member/caregiver;Patient    Family Member Consulted Daughter           Patient will benefit from skilled therapeutic intervention in order to improve the following deficits and impairments:  Abnormal gait, Decreased balance, Decreased mobility, Difficulty walking, Decreased endurance, Pain, Decreased strength, Decreased safety awareness, Decreased knowledge of use of DME, Decreased activity tolerance, Decreased range of motion, Impaired vision/preception  Visit Diagnosis: Other abnormalities of gait and mobility  Unsteadiness on feet  Muscle weakness (generalized)     Problem List Patient Active Problem List   Diagnosis Date Noted  . Hyperkalemia 01/14/2020  . Chronic RUQ pain 01/14/2020  . Sensation of fullness in ear, bilateral  01/11/2020  . Aphasia due to acute stroke ([place]) 08/23/2019  . Hyperlipemia 08/23/2019  . ICH (intracerebral hemorrhage) ([facility]) L parietal w/ L SDH s/p crani 08/09/2019  . Subdural hematoma, acute ([place]) 08/09/2019  . Healthcare maintenance 06/20/2019  . History of strabismus surgery 03/31/2019  . Moderate, recurrent MDD 06/06/2018  . 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 Osteoarthritis of right knee 06/05/2018  . Essential hypertension 06/05/2018  . Anxiety 06/05/2018  . Age-related nuclear cataract of both eyes 11/26/2014  . Diplopia 11/26/2014  . Monocular esotropia of left eye 11/26/2014  . Corneal transplant failure 10/27/2011    Jones Bales, PT, DPT 02/18/2020, 6:19 PM  [facility] [address] [place], [place], [address] Phone: [phone]   Fax:  [phone]  Name: Michelle Carrillo MRN: [id number] Date of Birth: [birth year]

## 2020-02-20 NOTE — Therapy (Signed)
[facility] [address] [place], [place], [address] Phone: [phone]   Fax:  [phone]  Speech Language Pathology Evaluation  Patient Details  Name: Michelle Carrillo MRN: [id number] Date of Birth: 1950-10-01 Referring Provider (SLP): Frann Rider, NP   Encounter Date: 02/18/2020    Past Medical History:  Diagnosis Date  . Anxiety   . Depression   . Herpes zoster kerstitis s/p corneal transplant 2015   . Hypertension   . Right knee osteoarthritis   . Strabismus    due to eye injury as a child  . Stroke [facility])     Past Surgical History:  Procedure Laterality Date  . CORNEAL TRANSPLANT Left    2006 and 2016  . CRANIOTOMY Left 08/09/2019   Procedure: CRANIOTOMY HEMATOMA EVACUATION SUBDURAL;  Surgeon: Ashok Pall, MD;  Location: [facility];  Service: Neurosurgery;  Laterality: Left;  left    There were no vitals filed for this visit.       SLP Evaluation [facility] - 02/20/20 1023      SLP Visit Information   SLP Received On 02/18/20    Referring Provider (SLP) Frann Rider, NP    Onset Date 08/09/19    Medical Diagnosis SDH      Subjective   Patient/Family Stated Goal "To get her memory better and processing faster"      General Information   HPI She has past medical history of hypertension, anxiety and depression who presented to [facility] emergency room on 08/09/2019 with altered mental status.  CT scan of the head showed a large moderate size cortically based hematoma in the left parietal region with extension into the subdural space resulting in a fair sized subdural hematoma with left-to-right midline shift.  She had neurological worsening on exam requiring emergent neurosurgical consultation and patient underwent craniotomy for evacuation of the subdural hematoma.  She was kept in the intensive care unit and blood pressure was tightly controlled.  She was started on hypertonic saline.   Follow-up CT scan showed gradual improvement of her rightward midline shift from 7 mm down.  2D echo showed normal ejection fraction without cardiac source of embolism.  EEG shows no seizure activity but she was started on Keppra for seizure prophylaxis.  Repeat CT on the day of discharge on 08/23/2019 showed reduction in midline shift from 11 to 8 mm.  Patient was found to have mild aphasia on exam but otherwise no focal deficits.  She was transferred to inpatient rehab to [facility] in [place] as there was no beds available at [facility] inpatient rehab. Some HHST.     Behavioral/Cognition WFL Prior to admission    Mobility Status walks independently      Balance Screen   Has the patient fallen in the past 6 months No    Has the patient had a decrease in activity level because of a fear of falling?  Yes    Is the patient reluctant to leave their home because of a fear of falling?  Yes      Prior Functional Status   Cognitive/Linguistic Baseline Within functional limits    Type of Home House     Lives With Family    Available Support Family    Education 3rd grade    Vocation Retired      Associate Professor   Overall Cognitive Status Impaired/Different from baseline    Area of Impairment Orientation;Attention;Memory;Following commands;Awareness;Problem solving    Current Attention Level Sustained  Memory Decreased recall of precautions;Decreased short-term memory    Following Commands Follows one step commands consistently;Follows multi-step commands inconsistently    Following Command Comments difficulty following directions on CLQT, repetitions and demonstrations required. Much difficulty following trail  making and symbol cancellation    Awareness Intellectual    Problem Solving Slow processing;Decreased initiation;Difficulty sequencing;Requires verbal cues    Executive Function Reasoning;Sequencing;Organizing;Decision Making;Self Monitoring      Auditory Comprehension   Overall Auditory  Comprehension Impaired    Yes/No Questions Not tested    Commands Impaired    One Step Basic Commands 75-100% accurate    Multistep Basic Commands 50-74% accurate    Complex Commands 25-49% accurate    Conversation Simple    Interfering Components Attention;Anxiety;Processing speed;Working Secondary school teacher;Slowed speech;Pausing      Visual Recognition/Discrimination   Discrimination Not tested      Reading Comprehension   Reading Status Unable to assess (comment)   pt with 3rd grade education     Expression   Primary Mode of Expression Verbal      Verbal Expression   Overall Verbal Expression Impaired    Initiation No impairment    Level of Generative/Spontaneous Verbalization Conversation    Repetition No impairment    Naming Impairment    Responsive Not tested    Confrontation 75-100% accurate    Convergent Not tested    Divergent 50-74% accurate    Pragmatics No impairment    Interfering Components Attention    Effective Techniques Open ended questions;Semantic cues;Sentence completion      Written Expression   Dominant Hand Right    Written Expression --   at baseline pt does not write     Oral Motor/Sensory Function   Overall Oral Motor/Sensory Function Appears within functional limits for tasks assessed      Motor Speech   Overall Motor Speech Appears within functional limits for tasks assessed   interpreter understood all utterances     Standardized Assessments   Standardized Assessments  Cognitive Linguistic Quick Test   subtests to be completed next session, score won't be valid                            SLP Short Term Goals - 02/18/20 1531      SLP SHORT TERM GOAL #1   Title Pt wil recall and request am meds with occasional min A from caregivers over 3 sessions    Time 4    Period Weeks    Status New      SLP SHORT TERM GOAL #2   Title Pt and family will carryover 3  compensations for auditory comprehension so pt can accurately follow directions and understand daily plan    Time 4    Period Weeks    Status New      SLP SHORT TERM GOAL #3   Title Pt will be oriented to day and time as measured by her verbalizing times she needs to get dressed and leave for her appointments with compensatory strategies and cues from family    Time 4    Period Weeks    Status New      SLP SHORT TERM GOAL #4   Title Complete CLQT    Time 2    Period Weeks    Status New            SLP Long Term Goals -  02/20/20 1025      SLP LONG TERM GOAL #1   Title Pt will use external aids to be oriented to date, time and daily plan with rare min A from family    Time 8    Period Weeks    Status New      SLP LONG TERM GOAL #2   Title Pt will get ready for appointments and daily tasks in timely manner with rare min A from family    Time 8    Period Weeks    Status New      SLP LONG TERM GOAL #3   Title Pt will ID and correct language errors in converation with occasional min A from family over 2 sessions    Time [place] - 02/20/20 1029    Clinical Impression Statement Marietta Sikkema is referred for outpt ST s/p SDH/craniotomy due to ongoing cognitive communication impairments. She is accompanied by her daughter, Sara Chu and interpreter. [name] is unaware of her impairments, however daughter reports that her mom's memory hs been affected, as well as her orientation to day and time. Daughter reports her mom is processing slower and it takes her a long time to complete daily tasks. Her daughter, Sara Chu gives example of her mom saying she spoke to someone in person, when she spoke with them over the phone.  Eva's daughters are managing her medications. At this time, it is difficulty to ID cognitive vs aphasic errors, The Cognitive Linguistic Quick Test was administered, however not completed due to time constraints and use of  interpreter. Of note, [name] had difficulty following directions on the CLQT, including symbol cancellation, symbol trails and design memory. These all required repetition, explanation and demonstration. When asked her age, [name] stated "14." I asked her to write her age (to compensate for possible aphasic error) and she wrote her birthday despite cues. She repeated/recalled 7/18 details on story recall, crossed out 5/12 symbols and 2 incorrectly which she realized after cues. With location of SDH, I would expect auditory comprehension deficits, impacted by working memory and short term memory. Will complete test next session, however, standard scores may not be valid due to pt with 3rd grade education. I recommend skilled ST to maximize cognitive linguistic skills for safety, QOL and to reduce caregiver burden    Speech Therapy Frequency 2x / week    Treatment/Interventions SLP instruction and feedback;Compensatory strategies;Functional tasks;Cognitive reorganization;Compensatory techniques;Cueing hierarchy;Environmental controls;Patient/family education;Multimodal communcation approach;Internal/external aids    Potential to Achieve Goals Good    Potential Considerations Ability to learn/carryover information    Consulted and Agree with Plan of Care Patient;Family member/caregiver    Family Member Consulted daughter Sara Chu           Patient will benefit from skilled therapeutic intervention in order to improve the following deficits and impairments:   Cognitive communication deficit    Problem List Patient Active Problem List   Diagnosis Date Noted  . Hyperkalemia 01/14/2020  . Chronic RUQ pain 01/14/2020  . Sensation of fullness in ear, bilateral 01/11/2020  . Aphasia due to acute stroke ([place]) 08/23/2019  . Hyperlipemia 08/23/2019  . ICH (intracerebral hemorrhage) (HCC) L parietal w/ L SDH s/p crani 08/09/2019  . Subdural hematoma, acute ([place]) 08/09/2019  . Healthcare maintenance 06/20/2019    . History of strabismus surgery 03/31/2019  . Moderate, recurrent MDD 06/06/2018  .  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 Osteoarthritis of right knee 06/05/2018  . Essential hypertension 06/05/2018  . Anxiety 06/05/2018  . Age-related nuclear cataract of both eyes 11/26/2014  . Diplopia 11/26/2014  . Monocular esotropia of left eye 11/26/2014  . Corneal transplant failure 10/27/2011    Zaakirah Kistner, Annye Rusk MS, CCC-SLP 02/20/2020, 10:30 AM  [facility] [address] [place], [place], [address] Phone: [phone]   Fax:  [phone]  Name: RACHAEL FERRIE MRN: [id number] Date of Birth: 01-05-1951

## 2020-02-27 ENCOUNTER — Ambulatory Visit: Payer: Medicare Other | Admitting: Speech Pathology

## 2020-02-27 ENCOUNTER — Ambulatory Visit: Payer: Medicare Other | Admitting: Physical Therapy

## 2020-02-27 ENCOUNTER — Encounter: Payer: Self-pay | Admitting: Speech Pathology

## 2020-02-27 ENCOUNTER — Encounter: Payer: Self-pay | Admitting: Physical Therapy

## 2020-02-27 ENCOUNTER — Other Ambulatory Visit: Payer: Self-pay

## 2020-02-27 DIAGNOSIS — R41841 Cognitive communication deficit: Secondary | ICD-10-CM | POA: Diagnosis not present

## 2020-02-27 DIAGNOSIS — R2689 Other abnormalities of gait and mobility: Secondary | ICD-10-CM | POA: Diagnosis not present

## 2020-02-27 DIAGNOSIS — M6281 Muscle weakness (generalized): Secondary | ICD-10-CM

## 2020-02-27 DIAGNOSIS — R2681 Unsteadiness on feet: Secondary | ICD-10-CM

## 2020-02-27 NOTE — Therapy (Signed)
[facility] [address] [place], [place], [address] Phone: [phone]   Fax:  [phone]  Speech Language Pathology Treatment  Patient Details  Name: Michelle Carrillo MRN: [id number] Date of Birth: 1950/12/22 Referring Provider (SLP): Michelle Rider, NP   Encounter Date: 02/27/2020   End of Session - 02/27/20 1557    Visit Number 2    Number of Visits 17    Date for SLP Re-Evaluation 04/14/20    SLP Start Time 6962    SLP Stop Time  9528    SLP Time Calculation (min) 42 min           Past Medical History:  Diagnosis Date  . Anxiety   . Depression   . Herpes zoster kerstitis s/p corneal transplant 2015   . Hypertension   . Right knee osteoarthritis   . Strabismus    due to eye injury as a child  . Stroke [facility])     Past Surgical History:  Procedure Laterality Date  . CORNEAL TRANSPLANT Left    2006 and 2016  . CRANIOTOMY Left 08/09/2019   Procedure: CRANIOTOMY HEMATOMA EVACUATION SUBDURAL;  Surgeon: Michelle Pall, MD;  Location: [facility];  Service: Neurosurgery;  Laterality: Left;  left    There were no vitals filed for this visit.   Subjective Assessment - 02/27/20 1500    Subjective "I'm good"    Patient is accompained by: Family member;Interpreter   daugher Michelle Carrillo                ADULT SLP TREATMENT - 02/27/20 1455      General Information   Behavior/Cognition Alert;Cooperative;Pleasant mood      Treatment Provided   Treatment provided Cognitive-Linquistic      Cognitive-Linquistic Treatment   Treatment focused on Cognition;Patient/family/caregiver education    Skilled Treatment Attempted to complete Cognitive Linguistic Quick Test, however Michelle Carrillo did not comprehend maze or design generation. Her daughter stated she has never done these before due to lack of education. Elected to defer CLQT. Today, we targeted medication management and trained Michelle Carrillo and Michelle Carrillo in compensations for  attention and memory, including limiting distractions, completing 1 task before starting another, keeping meals simple and not leaving the kitchen when she is cooking. Michelle Carrillo has noticed she forgets why she went into a room or what she is supposed to do. Trained in short term memory strategies. 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 We generated visual support of picture of microphone next to her tablet as cue for locating mute/unmute. as Michelle Carrillo continues to have difficulty being oriented to the day and her appointments, provided a calendar and demonstrated how to write times in. Instructed Michelle Carrillo to cross off each day as it is over, and Michelle Carrillo will cue her to look at her calendar to be aware of appointments.      Assessment / Recommendations / Plan   Plan Continue with current plan of care      Progression Toward Goals   Progression toward goals Progressing toward goals              SLP Short Term Goals - 02/27/20 1556      SLP SHORT TERM GOAL #1   Title Pt wil recall and request am meds with occasional min A from caregivers over 3 sessions    Time 4    Period Weeks    Status  On-going      SLP SHORT TERM GOAL #2   Title Pt and family will carryover 3 compensations for auditory comprehension so pt can accurately follow directions and understand daily plan    Time 4    Period Weeks    Status On-going      SLP SHORT TERM GOAL #3   Title Pt will be oriented to day and time as measured by her verbalizing times she needs to get dressed and leave for her appointments with compensatory strategies and cues from family    Time 4    Period Weeks    Status On-going      SLP SHORT TERM GOAL #4   Title Complete CLQT    Time 2    Period Weeks    Status On-going            SLP Long Term Goals - 02/27/20 1556      SLP LONG TERM GOAL #1   Title Pt will use external aids to be oriented to date, time and  daily plan with rare min A from family    Time 8    Period Weeks    Status On-going      SLP LONG TERM GOAL #2   Title Pt will get ready for appointments and daily tasks in timely manner with rare min A from family    Time 8    Period Weeks    Status On-going      SLP LONG TERM GOAL #3   Title Pt will ID and correct language errors in converation with occasional min A from family over 2 sessions    Time 8    Period Weeks    Status On-going            Plan - 02/27/20 1555    Clinical Impression Statement Michelle Carrillo is referred for outpt ST s/p SDH/craniotomy due to ongoing cognitive communication impairments. She is accompanied by her daughter, Michelle Carrillo and interpreter. Michelle Carrillo is unaware of her impairments, however daughter reports that her mom's memory hs been affected, as well as her orientation to day and time. Daughter reports her mom is processing slower and it takes her a long time to complete daily tasks. Her daughter, Michelle Carrillo gives example of her mom saying she spoke to someone in person, when she spoke with them over the phone.  Michelle Carrillo's daughters are managing her medications. At this time, it is difficulty to ID cognitive vs aphasic errors, The Cognitive Linguistic Quick Test was administered, however not completed due to time constraints and use of interpreter. Of note, Michelle Carrillo had difficulty following directions on the CLQT, including symbol cancellation, symbol trails and design memory. These all required repetition, explanation and demonstration. When asked her age, Michelle Carrillo stated "58." I asked her to write her age (to compensate for possible aphasic error) and she wrote her birthday despite cues. She repeated/recalled 7/18 details on story recall, crossed out 5/12 symbols and 2 incorrectly which she realized after cues. With location of SDH, I would expect auditory comprehension deficits, impacted by working memory and short term memory. Will complete test next session, however, standard  scores may not be valid due to pt with 3rd grade education. I recommend skilled ST to maximize cognitive linguistic skills for safety, QOL and to reduce caregiver burden    Speech Therapy Frequency 2x / week    Duration 8 weeks   or 17 visits   Treatment/Interventions SLP instruction and feedback;Compensatory strategies;Functional tasks;Cognitive  reorganization;Compensatory techniques;Cueing hierarchy;Environmental controls;Patient/family education;Multimodal communcation approach;Internal/external aids    Potential to Achieve Goals Good    Potential Considerations Ability to learn/carryover information           Patient will benefit from skilled therapeutic intervention in order to improve the following deficits and impairments:   Cognitive communication deficit    Problem List Patient Active Problem List   Diagnosis Date Noted  . Hyperkalemia 01/14/2020  . Chronic RUQ pain 01/14/2020  . Sensation of fullness in ear, bilateral 01/11/2020  . Aphasia due to acute stroke ([place]) 08/23/2019  . Hyperlipemia 08/23/2019  . ICH (intracerebral hemorrhage) (HCC) L parietal w/ L SDH s/p crani 08/09/2019  . Subdural hematoma, acute ([place]) 08/09/2019  . Healthcare maintenance 06/20/2019  . History of strabismus surgery 03/31/2019  . Moderate, recurrent MDD 06/06/2018  . 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 Osteoarthritis of right knee 06/05/2018  . Essential hypertension 06/05/2018  . Anxiety 06/05/2018  . Age-related nuclear cataract of both eyes 11/26/2014  . Diplopia 11/26/2014  . Monocular esotropia of left eye 11/26/2014  . Corneal transplant failure 10/27/2011    Emmitte Surgeon, Annye Rusk MS, CCC-SLP 02/27/2020, 3:57 PM  [facility] [address] [place], [place], [address] Phone: [phone]   Fax:  [phone]   Name: Michelle Carrillo MRN: [id number] Date of Birth:  [birth year]

## 2020-02-27 NOTE — Patient Instructions (Signed)
  Use a visual reminder - wall calendar and put times of appointments on the calendar and cross off the days - she may need reminders to do this   When you are going into a room for something, repeat it over and over in your head until you get it  Don't stop and do something else until you have finished a task  Have everything out you need before you start a task  Your husband shouldn't interrupt you while you are concentrating on a task  Limit distractions of talking on the phone or watching TV  Use visual support of picture of microphone and picture of it X'd out onside of computer

## 2020-02-27 NOTE — Patient Instructions (Signed)
Access Code: [email or web address] URL: [email or web address] Date: 02/27/2020 Prepared by: Willow Ora  Exercises Tandem Walking with Counter Support - 1 x daily - 5 x weekly - 1 sets - 6 reps Walking with Head Rotation - 1 x daily - 5 x weekly - 1 sets - 10 reps Romberg Stance with Eyes Closed - 1 x daily - 5 x weekly - 1 sets - 3 reps - 30 hold Standing Balance with Eyes Closed on Foam - 1 x daily - 5 x weekly - 1 sets - 3 reps - 30 hold

## 2020-02-29 NOTE — Therapy (Signed)
[facility] [address] [place], [place], [address] Phone: [phone]   Fax:  [phone]  Physical Therapy Treatment  Patient Details  Name: Michelle Carrillo MRN: [id number] Date of Birth: 1950/06/12 Referring Provider (PT): Referred by Joni Reining, DO. Followed by Frann Rider, NP   Encounter Date: 02/27/2020     02/27/20 1413  PT Visits / Re-Eval  Visit Number 2  Number of Visits 17  Date for PT Re-Evaluation 05/18/20 (POC for 8 weeks, Cert for 90 days)  Authorization  Authorization Type Medicare (10th Visit PN)  Progress Note Due on Visit 10  PT Time Calculation  PT Start Time 1408 (pt late for appt today)  PT Stop Time 1445  PT Time Calculation (min) 37 min  PT - End of Session  Equipment Utilized During Treatment Gait belt  Activity Tolerance Patient tolerated treatment well  Behavior During Therapy WFL for tasks assessed/performed    Past Medical History:  Diagnosis Date  . Anxiety   . Depression   . Herpes zoster kerstitis s/p corneal transplant 2015   . Hypertension   . Right knee osteoarthritis   . Strabismus    due to eye injury as a child  . Stroke [facility])     Past Surgical History:  Procedure Laterality Date  . CORNEAL TRANSPLANT Left    2006 and 2016  . CRANIOTOMY Left 08/09/2019   Procedure: CRANIOTOMY HEMATOMA EVACUATION SUBDURAL;  Surgeon: Ashok Pall, MD;  Location: [facility];  Service: Neurosurgery;  Laterality: Left;  left    There were no vitals filed for this visit.     02/27/20 1410  Symptoms/Limitations  Subjective No new complaints. Does report some back pain. Happened yesterday with mopping the floor.  Patient is accompained by: Family member;Interpreter (daughter)  Pertinent History HTN, Anxiety, Depression, OA, Strabismus  Patient Stated Goals walk better  Pain Assessment  Currently in Pain? Yes  Pain Score 3  Pain Location Back (and bil knees- chronic  pain)  Pain Orientation Right;Lower  Pain Descriptors / Indicators Aching;Sore  Pain Type Acute pain;Chronic pain  Pain Onset Yesterday  Pain Frequency Occasional  Aggravating Factors  poor mechanics with mopping the floor  Pain Relieving Factors resting,    Treatment: Focused on setting up pt's HEP for balance today. Mod to max cues needed (verbal, visual and tactile) for correct ex form and technique. All ex's provided in Spanish for home.   Issued the following to HEP today: Access Code: [email or web address] URL: [email or web address] Date: 02/27/2020 Prepared by: Willow Ora  Exercises Tandem Walking with Counter Support - 1 x daily - 5 x weekly - 1 sets - 6 reps Walking with Head Rotation - 1 x daily - 5 x weekly - 1 sets - 10 reps Romberg Stance with Eyes Closed - 1 x daily - 5 x weekly - 1 sets - 3 reps - 30 hold Standing Balance with Eyes Closed on Foam - 1 x daily - 5 x weekly - 1 sets - 3 reps - 30 hold        02/27/20 1700  PT Education  Education Details HEP for balance  Person(s) Educated Patient;Child(ren)  Methods Explanation;Demonstration;Verbal cues;Tactile cues;Handout  Comprehension Verbalized understanding;Returned demonstration;Verbal cues required;Tactile cues required;Need further instruction       PT Short Term Goals - 02/18/20 1811      PT SHORT TERM GOAL #1   Title patient will be independent with inital HEP for strengthening/balance (ALL STGS Due: 03/17/20)  Baseline no HEP established    Time 4    Period Weeks    Status New    Target Date 03/17/20      PT SHORT TERM GOAL #2   Title Patient will improve Berg Balance to >/= 45/56 to demonstrate reduced fall risk    Baseline 43/56    Time 4    Period Weeks    Status New      PT SHORT TERM GOAL #3   Title Patient will demonstrate ability to ambulate x 300 ft without AD and demo proper visual scanning to allow for improved safety with household/community mobility and compensate for  visual deficits    Baseline no scanning; bumping into objects on L    Time 4    Period Weeks    Status New             PT Long Term Goals - 02/18/20 1814      PT LONG TERM GOAL #1   Title Patient will be independent with final HEP for strengthening/balance (ALL LTGS DUE: 04/14/2020)    Baseline no HEP established    Time 8    Period Weeks    Status New    Target Date 04/14/20      PT LONG TERM GOAL #2   Title Patient will improve TUG to </= 12 seconds without AD to demosntrate reduced fall risk    Baseline 14.32 secs    Time 8    Period Weeks    Status New      PT LONG TERM GOAL #3   Title Patient will improve gait speed to >/= 2.7 ft/sec to demonstrate improved community mobility    Baseline 2.14 ft/sec    Time 8    Period Weeks    Status New      PT LONG TERM GOAL #4   Title Patient will improve Berg Balance Score to >/= 50/56 to demonstrate reduced risk for falls and improved balance    Baseline 43/56    Time 8    Period Weeks    Status New      PT LONG TERM GOAL #5   Title Patient will be able to complete 5x sit <> stand in </= 12 seconds without UE support to demonstrate improved balance    Baseline 16.9 secs    Time 8    Period Weeks    Status New             02/27/20 1415  Plan  Clinical Impression Statement Today's skilled session focused on establishment of an HEP to address balance at home. Multi modal cues needed with translator for correct technique in session. Pt will benefit from review at next session to ensure she is doing the ex's correctly. No issues noted or reported in session. The pt is progressing toward goals and should benefit from continued PT to progress toward unmet goals.  Personal Factors and Comorbidities Comorbidity 3+  Comorbidities HTN, Anxiety, Depression, OA, Strabismus  Examination-Activity Limitations Caring for Others;Locomotion Level;Stairs;Transfers  Examination-Participation Restrictions Cleaning;Community  Activity;Medication Management  Pt will benefit from skilled therapeutic intervention in order to improve on the following deficits Abnormal gait;Decreased balance;Decreased mobility;Difficulty walking;Decreased endurance;Pain;Decreased strength;Decreased safety awareness;Decreased knowledge of use of DME;Decreased activity tolerance;Decreased range of motion;Impaired vision/preception  Stability/Clinical Decision Making Stable/Uncomplicated  Rehab Potential Good  PT Frequency 2x / week  PT Duration 8 weeks  PT Treatment/Interventions ADLs/Self Care Home Management;DME Instruction;Stair training;Gait training;Functional mobility training;Therapeutic activities;Balance training;Neuromuscular re-education;Therapeutic  exercise;Patient/family education;Orthotic Fit/Training;Manual techniques;Vestibular;Passive range of motion  PT Next Visit Plan Did we recieve OT referral? Schedule OT eval if possible. Review HEP issued at last session; continue to work on high level balance activities  PT Home Exercise Plan Access Code: [place] and Agree with Plan of Care Family member/caregiver;Patient  Family Member Consulted Daughter          Patient will benefit from skilled therapeutic intervention in order to improve the following deficits and impairments:  Abnormal gait, Decreased balance, Decreased mobility, Difficulty walking, Decreased endurance, Pain, Decreased strength, Decreased safety awareness, Decreased knowledge of use of DME, Decreased activity tolerance, Decreased range of motion, Impaired vision/preception  Visit Diagnosis: Other abnormalities of gait and mobility  Unsteadiness on feet  Muscle weakness (generalized)     Problem List Patient Active Problem List   Diagnosis Date Noted  . Hyperkalemia 01/14/2020  . Chronic RUQ pain 01/14/2020  . Sensation of fullness in ear, bilateral 01/11/2020  . Aphasia due to acute stroke ([facility]) 08/23/2019  . Hyperlipemia 08/23/2019   . ICH (intracerebral hemorrhage) (HCC) L parietal w/ L SDH s/p crani 08/09/2019  . Subdural hematoma, acute ([facility]) 08/09/2019  . Healthcare maintenance 06/20/2019  . History of strabismus surgery 03/31/2019  . Moderate, recurrent MDD 06/06/2018  . 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 Osteoarthritis of right knee 06/05/2018  . Essential hypertension 06/05/2018  . Anxiety 06/05/2018  . Age-related nuclear cataract of both eyes 11/26/2014  . Diplopia 11/26/2014  . Monocular esotropia of left eye 11/26/2014  . Corneal transplant failure 10/27/2011    Willow Ora, PTA, [facility] [address], [place], [place] [address] [phone] 02/29/20, 9:12 AM   Name: Michelle Carrillo MRN: [id number] Date of Birth: October 24, 1950

## 2020-03-05 ENCOUNTER — Telehealth: Payer: Self-pay

## 2020-03-05 ENCOUNTER — Ambulatory Visit: Payer: Medicare Other

## 2020-03-05 ENCOUNTER — Other Ambulatory Visit: Payer: Self-pay

## 2020-03-05 DIAGNOSIS — H539 Unspecified visual disturbance: Secondary | ICD-10-CM

## 2020-03-05 DIAGNOSIS — M6281 Muscle weakness (generalized): Secondary | ICD-10-CM | POA: Diagnosis not present

## 2020-03-05 DIAGNOSIS — R2689 Other abnormalities of gait and mobility: Secondary | ICD-10-CM | POA: Diagnosis not present

## 2020-03-05 DIAGNOSIS — I69398 Other sequelae of cerebral infarction: Secondary | ICD-10-CM

## 2020-03-05 DIAGNOSIS — I69319 Unspecified symptoms and signs involving cognitive functions following cerebral infarction: Secondary | ICD-10-CM

## 2020-03-05 DIAGNOSIS — R2681 Unsteadiness on feet: Secondary | ICD-10-CM | POA: Diagnosis not present

## 2020-03-05 DIAGNOSIS — R41841 Cognitive communication deficit: Secondary | ICD-10-CM | POA: Diagnosis not present

## 2020-03-05 NOTE — Therapy (Signed)
[facility] [address] [place], [place], [address] Phone: [phone]   Fax:  [phone]  Physical Therapy Treatment  Patient Details  Name: Michelle Carrillo MRN: [id number] Date of Birth: 01/02/1951 Referring Provider (PT): Referred by Joni Reining, DO. Followed by Frann Rider, NP   Encounter Date: 03/05/2020   PT End of Session - 03/05/20 1319    Visit Number 3    Number of Visits 17    Date for PT Re-Evaluation 05/18/20   POC for 8 weeks, Cert for 90 days   Authorization Type Medicare (10th Visit PN)    Progress Note Due on Visit 10    PT Start Time 1317    PT Stop Time 1359    PT Time Calculation (min) 42 min    Equipment Utilized During Treatment Gait belt    Activity Tolerance Patient tolerated treatment well    Behavior During Therapy WFL for tasks assessed/performed           Past Medical History:  Diagnosis Date  . Anxiety   . Depression   . Herpes zoster kerstitis s/p corneal transplant 2015   . Hypertension   . Right knee osteoarthritis   . Strabismus    due to eye injury as a child  . Stroke [facility])     Past Surgical History:  Procedure Laterality Date  . CORNEAL TRANSPLANT Left    2006 and 2016  . CRANIOTOMY Left 08/09/2019   Procedure: CRANIOTOMY HEMATOMA EVACUATION SUBDURAL;  Surgeon: Ashok Pall, MD;  Location: [facility];  Service: Neurosurgery;  Laterality: Left;  left    There were no vitals filed for this visit.   Subjective Assessment - 03/05/20 1319    Subjective Patient reports no new changes since last viist. No pain today. No falls. Patient reports that she has not completed her HEP.    Patient is accompained by: Family member;Interpreter   daughter   Pertinent History HTN, Anxiety, Depression, OA, Strabismus    Patient Stated Goals walk better    Currently in Pain? No/denies    Pain Onset Yesterday               [facility] Adult PT Treatment/Exercise - 03/05/20  0001      Transfers   Transfers Sit to Stand;Stand to Sit    Sit to Stand 5: Supervision    Sit to Stand Details Verbal cues for sequencing;Verbal cues for technique    Stand to Sit 5: Supervision    Comments completed x 5 reps throughout session      Ambulation/Gait   Ambulation/Gait Yes    Ambulation/Gait Assistance 5: Supervision    Ambulation/Gait Assistance Details througohut gym with high level balance    Assistive device None    Gait Pattern Within Functional Limits    Ambulation Surface Level;Indoor      High Level Balance   High Level Balance Activities Head turns    High Level Balance Comments Completed ambulation with horizontal/vertical head turns x 115 ft each. Max verbal cues required for proper completion. Patient often moving just the eys, require visual and verbal cues to demo improved completion to promote improved scanning. PT educating on importance of this due to visual impairments. At countertop: completed forward marching x 3 laps, down and back with intermittent UE support.           Completed complete review of current HEP to ensure compliance. Patient has not completed the HEP at home. Patient  continues to require increased verbal/tactile cues. PT educating daughter on potential to have supervision with exercises to ensure proper completion and adherence.    Access Code: [email or web address] URL: [email or web address] Date: 03/05/2020 Prepared by: Baldomero Lamy  Exercises Tandem Walking with Counter Support - 1 x daily - 5 x weekly - 1 sets - 6 reps Walking with Head Rotation - 1 x daily - 5 x weekly - 1 sets - 10 reps Romberg Stance with Eyes Closed - 1 x daily - 5 x weekly - 1 sets - 3 reps - 30 hold Standing Balance with Eyes Closed on Foam - 1 x daily - 5 x weekly - 1 sets - 3 reps - 30 hold     Balance Exercises - 03/05/20 0001      Balance Exercises: Standing   Standing Eyes Opened Wide (BOA);Head turns;Foam/compliant surface;Limitations     Standing Eyes Opened Limitations completed horizontal/vertical head turns x 10 reps each direction. verbal cues for proper completion.             PT Education - 03/05/20 1347    Education Details Reviewed HEP and provided new handout    Person(s) Educated Patient;Child(ren)    Methods Explanation;Demonstration;Handout    Comprehension Verbalized understanding;Returned demonstration;Need further instruction;Verbal cues required;Tactile cues required            PT Short Term Goals - 02/18/20 1811      PT SHORT TERM GOAL #1   Title patient will be independent with inital HEP for strengthening/balance (ALL STGS Due: 03/17/20)    Baseline no HEP established    Time 4    Period Weeks    Status New    Target Date 03/17/20      PT SHORT TERM GOAL #2   Title Patient will improve Berg Balance to >/= 45/56 to demonstrate reduced fall risk    Baseline 43/56    Time 4    Period Weeks    Status New      PT SHORT TERM GOAL #3   Title Patient will demonstrate ability to ambulate x 300 ft without AD and demo proper visual scanning to allow for improved safety with household/community mobility and compensate for visual deficits    Baseline no scanning; bumping into objects on L    Time 4    Period Weeks    Status New             PT Long Term Goals - 02/18/20 1814      PT LONG TERM GOAL #1   Title Patient will be independent with final HEP for strengthening/balance (ALL LTGS DUE: 04/14/2020)    Baseline no HEP established    Time 8    Period Weeks    Status New    Target Date 04/14/20      PT LONG TERM GOAL #2   Title Patient will improve TUG to </= 12 seconds without AD to demosntrate reduced fall risk    Baseline 14.32 secs    Time 8    Period Weeks    Status New      PT LONG TERM GOAL #3   Title Patient will improve gait speed to >/= 2.7 ft/sec to demonstrate improved community mobility    Baseline 2.14 ft/sec    Time 8    Period Weeks    Status New      PT LONG  TERM GOAL #4   Title Patient will improve Berg Balance Score to >/= 50/56  to demonstrate reduced risk for falls and improved balance    Baseline 43/56    Time 8    Period Weeks    Status New      PT LONG TERM GOAL #5   Title Patient will be able to complete 5x sit <> stand in </= 12 seconds without UE support to demonstrate improved balance    Baseline 16.9 secs    Time 8    Period Weeks    Status New                 Plan - 03/05/20 1400    Clinical Impression Statement Today's skilled PT session included continue review of current HEP to ensure proper completion and compliance as patient has not remembered to complete it at home prior to session. Patient continues to require increased verbal/tactile cues for improved technique/form with completion. Rest of session spent working on high level balance exercises, with patient tolerating well. Will continue per POC.    Personal Factors and Comorbidities Comorbidity 3+    Comorbidities HTN, Anxiety, Depression, OA, Strabismus    Examination-Activity Limitations Caring for Others;Locomotion Level;Stairs;Transfers    Examination-Participation Restrictions Cleaning;Community Activity;Medication Management    Stability/Clinical Decision Making Stable/Uncomplicated    Rehab Potential Good    PT Frequency 2x / week    PT Duration 8 weeks    PT Treatment/Interventions ADLs/Self Care Home Management;DME Instruction;Stair training;Gait training;Functional mobility training;Therapeutic activities;Balance training;Neuromuscular re-education;Therapeutic exercise;Patient/family education;Orthotic Fit/Training;Manual techniques;Vestibular;Passive range of motion    PT Next Visit Plan Did we recieve OT referral? Schedule OT eval if possible. Did we complete out HEP? Review HEP again if needed; continue to work on high level balance activities    PT Home Exercise Plan Access Code: [place] and Agree with Plan of Care Family  member/caregiver;Patient    Family Member Consulted Daughter           Patient will benefit from skilled therapeutic intervention in order to improve the following deficits and impairments:  Abnormal gait, Decreased balance, Decreased mobility, Difficulty walking, Decreased endurance, Pain, Decreased strength, Decreased safety awareness, Decreased knowledge of use of DME, Decreased activity tolerance, Decreased range of motion, Impaired vision/preception  Visit Diagnosis: Other abnormalities of gait and mobility  Unsteadiness on feet  Muscle weakness (generalized)     Problem List Patient Active Problem List   Diagnosis Date Noted  . Hyperkalemia 01/14/2020  . Chronic RUQ pain 01/14/2020  . Sensation of fullness in ear, bilateral 01/11/2020  . Aphasia due to acute stroke ([facility]) 08/23/2019  . Hyperlipemia 08/23/2019  . ICH (intracerebral hemorrhage) (HCC) L parietal w/ L SDH s/p crani 08/09/2019  . Subdural hematoma, acute ([facility]) 08/09/2019  . Healthcare maintenance 06/20/2019  . History of strabismus surgery 03/31/2019  . Moderate, recurrent MDD 06/06/2018  . 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 Osteoarthritis of right knee 06/05/2018  . Essential hypertension 06/05/2018  . Anxiety 06/05/2018  . Age-related nuclear cataract of both eyes 11/26/2014  . Diplopia 11/26/2014  . Monocular esotropia of left eye 11/26/2014  . Corneal transplant failure 10/27/2011    Jones Bales, PT, DPT 03/05/2020, 3:48 PM  [facility] [address] [place], [place], [address] Phone: [phone]   Fax:  [phone]  Name: Michelle Carrillo MRN: [id number] Date of Birth: Mar 24, 1951

## 2020-03-05 NOTE — Telephone Encounter (Signed)
Ms. Michelle Rider, NP,  Michelle Carrillo is being treated by Physical and Speech Therapy for Nontraumatic L Cortical Hemorrhage. The patient would benefit from Occupational Therapy evaluation for decreased coordination, visual impairments, and cognitive deficits .   If you agree, please place an order in [facility] workque in [facility] or fax the order to [phone]. Thank you, Guillermina [place], PT, [facility] [address] [place], [place]  [address] Phone:  [phone] Fax:  [phone]

## 2020-03-05 NOTE — Patient Instructions (Signed)
Access Code: [email or web address] URL: [email or web address] Date: 03/05/2020 Prepared by: Baldomero Lamy  Exercises Tandem Walking with Counter Support - 1 x daily - 5 x weekly - 1 sets - 6 reps Walking with Head Rotation - 1 x daily - 5 x weekly - 1 sets - 10 reps Romberg Stance with Eyes Closed - 1 x daily - 5 x weekly - 1 sets - 3 reps - 30 hold Standing Balance with Eyes Closed on Foam - 1 x daily - 5 x weekly - 1 sets - 3 reps - 30 hold

## 2020-03-10 NOTE — Telephone Encounter (Signed)
No problem.  Please let me know if anything else is needed!

## 2020-03-11 ENCOUNTER — Encounter: Payer: Self-pay | Admitting: Physical Therapy

## 2020-03-11 ENCOUNTER — Ambulatory Visit: Payer: Medicare Other

## 2020-03-11 ENCOUNTER — Other Ambulatory Visit: Payer: Self-pay

## 2020-03-11 ENCOUNTER — Ambulatory Visit: Payer: Medicare Other | Admitting: Physical Therapy

## 2020-03-11 DIAGNOSIS — R2689 Other abnormalities of gait and mobility: Secondary | ICD-10-CM

## 2020-03-11 DIAGNOSIS — R2681 Unsteadiness on feet: Secondary | ICD-10-CM | POA: Diagnosis not present

## 2020-03-11 DIAGNOSIS — R41841 Cognitive communication deficit: Secondary | ICD-10-CM | POA: Diagnosis not present

## 2020-03-11 DIAGNOSIS — M6281 Muscle weakness (generalized): Secondary | ICD-10-CM | POA: Diagnosis not present

## 2020-03-11 NOTE — Patient Instructions (Signed)
  It would be very helpful for [name] to accompany you to each therapy session. Sera de gran ayuda que [place] te acompae a cada sesin de terapia.  Continue to work with [place] on understanding when to use "mute" and "unmute" during [employer] for church. Contine trabajando con Mertie Clause para comprender cundo usar "silenciar" y "reactivar" durante las reuniones de [employer] para la [place].  Continue to cross off the days in order to keep track of the days you have appointments. Contine tachando los das para Opt[facility] un seguimiento de los das en que tiene citas.

## 2020-03-11 NOTE — Therapy (Signed)
[facility] [address] [place], [place], [address] Phone: [phone]   Fax:  [phone]  Speech Language Pathology Treatment  Patient Details  Name: Michelle Carrillo MRN: [id number] Date of Birth: [birth year] Referring Provider (SLP): Frann Rider, NP   Encounter Date: 03/11/2020   End of Session - 03/11/20 2303    Visit Number 3    Number of Visits 17    Date for SLP Re-Evaluation 04/14/20    SLP Start Time 0945   13 minutes late   SLP Stop Time  47    SLP Time Calculation (min) 30 min    Activity Tolerance Patient tolerated treatment well           Past Medical History:  Diagnosis Date  . Anxiety   . Depression   . Herpes zoster kerstitis s/p corneal transplant 2015   . Hypertension   . Right knee osteoarthritis   . Strabismus    due to eye injury as a child  . Stroke [facility])     Past Surgical History:  Procedure Laterality Date  . CORNEAL TRANSPLANT Left    2006 and 2016  . CRANIOTOMY Left 08/09/2019   Procedure: CRANIOTOMY HEMATOMA EVACUATION SUBDURAL;  Surgeon: Ashok Pall, MD;  Location: [facility];  Service: Neurosurgery;  Laterality: Left;  left    There were no vitals filed for this visit.   Subjective Assessment - 03/11/20 0951    Subjective 14 minutes late today.    Patient is accompained by: Interpreter   Alis, in-person interpreter   Currently in Pain? No/denies                 ADULT SLP TREATMENT - 03/11/20 0952      General Information   Behavior/Cognition Alert;Cooperative;Pleasant mood      Treatment Provided   Treatment provided Cognitive-Linquistic      Cognitive-Linquistic Treatment   Treatment focused on Cognition    Skilled Treatment Pt reports she is taking her medicines independently. "I remember, 'Oh it's almost time for me to take the pills.'" Daughter not present to confirm. Pt also reported once a more depressed mood alerted her she needs to take her  antidepressant. Pt reports taking meds in the morning 0900-0930.  Pt was describing many different iterations of how she is taking her meds, leaving SLP questions about medication adminstration. Again, daughter not here to confirm - SLP to write in after visit summary that it will be beneficial to have daughter present in sessions. SLP followed up from last sesison regarding mute/unmute for [employer] - pt stated that she is learning again how to use her tablet. SLP also questioned pt how crossing off days was going so she can keep oriented to day. She reported she is crossing off days as they pass, "prior to doing that I could not know what today is and now I know." pt stated. Notably, pt sometimes answered a different question than what SLP asked and SLP or interpreter had to repeat question.       Assessment / Recommendations / Plan   Plan Continue with current plan of care      Progression Toward Goals   Progression toward goals Progressing toward goals            SLP Education - 03/11/20 2302    Education Details important to have daughter attend ST    Person(s) Educated Patient    Methods Explanation;Handout    Comprehension Verbalized  understanding            SLP Short Term Goals - 03/11/20 2306      SLP SHORT TERM GOAL #1   Title Pt wil recall and request am meds with occasional min A from caregivers over 3 sessions    Time 3    Period Weeks    Status On-going      SLP SHORT TERM GOAL #2   Title Pt and family will carryover 3 compensations for auditory comprehension so pt can accurately follow directions and understand daily plan    Time 3    Period Weeks    Status On-going      SLP SHORT TERM GOAL #3   Title Pt will be oriented to day and time as measured by her verbalizing times she needs to get dressed and leave for her appointments with compensatory strategies and cues from family    Time 3    Period Weeks    Status On-going      SLP SHORT TERM GOAL  #4   Title Complete CLQT    Time 1    Period Weeks    Status Partially Met            SLP Long Term Goals - 03/11/20 2307      SLP LONG TERM GOAL #1   Title Pt will use external aids to be oriented to date, time and daily plan with rare min A from family    Time 7    Period Weeks    Status On-going      SLP LONG TERM GOAL #2   Title Pt will get ready for appointments and daily tasks in timely manner with rare min A from family    Time 7    Period Weeks    Status On-going      SLP LONG TERM GOAL #3   Title Pt will ID and correct language errors in converation with occasional min A from family over 2 sessions    Time 7    Period Weeks    Status On-going            Plan - 03/11/20 2303    Clinical Impression Statement Michelle Carrillo is referred for outpt ST s/p SDH/craniotomy due to ongoing cognitive communication impairments. Daughter did not attend therapy with pt today (daughter stated she had an interpretation job and dropped pt off), so SLP could not verify anything pt stated during session. SLP sent note home with pt indicating that it is helpful for daughter to attend with her mother to [place]. I recommend cont'd skilled ST to maximize cognitive linguistic skills for safety, QOL and to reduce caregiver burden    Speech Therapy Frequency 2x / week    Duration 8 weeks   or 17 visits   Treatment/Interventions SLP instruction and feedback;Compensatory strategies;Functional tasks;Cognitive reorganization;Compensatory techniques;Cueing hierarchy;Environmental controls;Patient/family education;Multimodal communcation approach;Internal/external aids    Potential to Achieve Goals Good    Potential Considerations Ability to learn/carryover information           Patient will benefit from skilled therapeutic intervention in order to improve the following deficits and impairments:   Cognitive communication deficit    Problem List Patient Active Problem List   Diagnosis  Date Noted  . Hyperkalemia 01/14/2020  . Chronic RUQ pain 01/14/2020  . Sensation of fullness in ear, bilateral 01/11/2020  . Aphasia due to acute stroke ([facility]) 08/23/2019  . Hyperlipemia 08/23/2019  . ICH (intracerebral hemorrhage) (  [facility]) L parietal w/ L SDH s/p crani 08/09/2019  . Subdural hematoma, acute ([facility]) 08/09/2019  . Healthcare maintenance 06/20/2019  . History of strabismus surgery 03/31/2019  . Moderate, recurrent MDD 06/06/2018  . 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 Osteoarthritis of right knee 06/05/2018  . Essential hypertension 06/05/2018  . Anxiety 06/05/2018  . Age-related nuclear cataract of both eyes 11/26/2014  . Diplopia 11/26/2014  . Monocular esotropia of left eye 11/26/2014  . Corneal transplant failure 10/27/2011    [facility] ,Adrian, CCC-SLP  03/11/2020, 11:08 PM  [facility] [address] [place], [place], [address] Phone: [phone]   Fax:  [phone]   Name: Michelle Carrillo MRN: [id number] Date of Birth: Aug 12, 1950

## 2020-03-11 NOTE — Therapy (Signed)
[facility] [address] [place], [place], [address] Phone: [phone]   Fax:  [phone]  Physical Therapy Treatment  Patient Details  Name: Michelle Carrillo MRN: [id number] Date of Birth: [birth year] Referring Provider (PT): Referred by Joni Reining, DO. Followed by Frann Rider, NP   Encounter Date: 03/11/2020   PT End of Session - 03/11/20 1023    Visit Number 4    Number of Visits 17    Date for PT Re-Evaluation 05/18/20   POC for 8 weeks, Cert for 90 days   Authorization Type Medicare (10th Visit PN)    Progress Note Due on Visit 10    PT Start Time 1018    PT Stop Time 1059    PT Time Calculation (min) 41 min    Equipment Utilized During Treatment Gait belt    Activity Tolerance Patient tolerated treatment well    Behavior During Therapy WFL for tasks assessed/performed           Past Medical History:  Diagnosis Date  . Anxiety   . Depression   . Herpes zoster kerstitis s/p corneal transplant 2015   . Hypertension   . Right knee osteoarthritis   . Strabismus    due to eye injury as a child  . Stroke [facility])     Past Surgical History:  Procedure Laterality Date  . CORNEAL TRANSPLANT Left    2006 and 2016  . CRANIOTOMY Left 08/09/2019   Procedure: CRANIOTOMY HEMATOMA EVACUATION SUBDURAL;  Surgeon: Ashok Pall, MD;  Location: [facility];  Service: Neurosurgery;  Laterality: Left;  left    There were no vitals filed for this visit.   Subjective Assessment - 03/11/20 1021    Subjective No new complaints. No falls or pain to report. Pt reports doing "some" ex's at home with daughter assistance.    Patient is accompained by: Interpreter   Alis, with [facility] contracted interpreter   Pertinent History HTN, Anxiety, Depression, OA, Strabismus    Limitations Walking    Patient Stated Goals walk better    Currently in Pain? No/denies    Pain Score 0-No pain                [facility] Adult PT  Treatment/Exercise - 03/11/20 1024      Transfers   Transfers Sit to Stand;Stand to Sit    Sit to Stand 5: Supervision    Stand to Sit 5: Supervision      Ambulation/Gait   Ambulation/Gait Yes    Ambulation/Gait Assistance 5: Supervision    Ambulation/Gait Assistance Details around gym with session    Assistive device None    Gait Pattern Within Functional Limits    Ambulation Surface Level;Indoor      High Level Balance   High Level Balance Activities Side stepping;Marching forwards;Marching backwards;Tandem walking   tandem gait fwd/bwd   High Level Balance Comments on red/blue mats next to parallel bars: 3-4 laps each with min guard to min assist for balance with multimodal cues needed on task, posture and ex technique. occasional touch to bar for balance.                Balance Exercises - 03/11/20 1033      Balance Exercises: Standing   Standing Eyes Closed Narrow base of support (BOS);Wide (BOA);Head turns;Foam/compliant surface;Other reps (comment);30 secs;Limitations    Standing Eyes Closed Limitations on airex with no UE support: feet together for EC 30 sec's x 3 reps, then  with feet hip width apart for EC head movements left<>right, up<>down for ~10 reps each. cues for posture/weight shifting to assist with balance. pt noted to have increase postural sway.    SLS with Vectors Foam/compliant surface;Intermittent upper extremity assist;5 reps;Limitations    SLS with Vectors Limitations on 1 inch foam with 2 tall cones on floor in front:  alternating forward foot taps to cones for ~5 reps each side with up to min assist/HHA on one side. cues for stance position, weight shifting and for increased hip/knee flexion     Partial Tandem Stance Eyes closed;Foam/compliant surface;Intermittent upper extremity support;2 reps;30 secs;Limitations    Partial Tandem Stance Limitations on airex: 2 reps each foot forward with cues on posture, stance position and weight shifting for balance.  min assist needed.                PT Short Term Goals - 02/18/20 1811      PT SHORT TERM GOAL #1   Title patient will be independent with inital HEP for strengthening/balance (ALL STGS Due: 03/17/20)    Baseline no HEP established    Time 4    Period Weeks    Status New    Target Date 03/17/20      PT SHORT TERM GOAL #2   Title Patient will improve Berg Balance to >/= 45/56 to demonstrate reduced fall risk    Baseline 43/56    Time 4    Period Weeks    Status New      PT SHORT TERM GOAL #3   Title Patient will demonstrate ability to ambulate x 300 ft without AD and demo proper visual scanning to allow for improved safety with household/community mobility and compensate for visual deficits    Baseline no scanning; bumping into objects on L    Time 4    Period Weeks    Status New             PT Long Term Goals - 02/18/20 1814      PT LONG TERM GOAL #1   Title Patient will be independent with final HEP for strengthening/balance (ALL LTGS DUE: 04/14/2020)    Baseline no HEP established    Time 8    Period Weeks    Status New    Target Date 04/14/20      PT LONG TERM GOAL #2   Title Patient will improve TUG to </= 12 seconds without AD to demosntrate reduced fall risk    Baseline 14.32 secs    Time 8    Period Weeks    Status New      PT LONG TERM GOAL #3   Title Patient will improve gait speed to >/= 2.7 ft/sec to demonstrate improved community mobility    Baseline 2.14 ft/sec    Time 8    Period Weeks    Status New      PT LONG TERM GOAL #4   Title Patient will improve Berg Balance Score to >/= 50/56 to demonstrate reduced risk for falls and improved balance    Baseline 43/56    Time 8    Period Weeks    Status New      PT LONG TERM GOAL #5   Title Patient will be able to complete 5x sit <> stand in </= 12 seconds without UE support to demonstrate improved balance    Baseline 16.9 secs    Time 8    Period Weeks  Status New                  Plan - 03/11/20 1023    Clinical Impression Statement Today's skilled session continued to focus on balance training on compliant surfaces. Multi modal cues needed on ex form/technique via Optometrist and demonstration. No issues noted or reported in session. The pt is progressing toward goals and should benefit from continued PT to progress toward unmet goals.    Personal Factors and Comorbidities Comorbidity 3+    Comorbidities HTN, Anxiety, Depression, OA, Strabismus    Examination-Activity Limitations Caring for Others;Locomotion Level;Stairs;Transfers    Examination-Participation Restrictions Cleaning;Community Activity;Medication Management    Stability/Clinical Decision Making Stable/Uncomplicated    Rehab Potential Good    PT Frequency 2x / week    PT Duration 8 weeks    PT Treatment/Interventions ADLs/Self Care Home Management;DME Instruction;Stair training;Gait training;Functional mobility training;Therapeutic activities;Balance training;Neuromuscular re-education;Therapeutic exercise;Patient/family education;Orthotic Fit/Training;Manual techniques;Vestibular;Passive range of motion    PT Next Visit Plan review HEP when daughter attends (either verbally or have pt perform to ensure it's being done at home). continue to work on high level balance activities    PT Home Exercise Plan Access Code: [place] and Agree with Plan of Care Family member/caregiver;Patient    Family Member Consulted Daughter           Patient will benefit from skilled therapeutic intervention in order to improve the following deficits and impairments:  Abnormal gait, Decreased balance, Decreased mobility, Difficulty walking, Decreased endurance, Pain, Decreased strength, Decreased safety awareness, Decreased knowledge of use of DME, Decreased activity tolerance, Decreased range of motion, Impaired vision/preception  Visit Diagnosis: Other abnormalities of gait and  mobility  Unsteadiness on feet  Muscle weakness (generalized)     Problem List Patient Active Problem List   Diagnosis Date Noted  . Hyperkalemia 01/14/2020  . Chronic RUQ pain 01/14/2020  . Sensation of fullness in ear, bilateral 01/11/2020  . Aphasia due to acute stroke ([facility]) 08/23/2019  . Hyperlipemia 08/23/2019  . ICH (intracerebral hemorrhage) ([facility]) L parietal w/ L SDH s/p crani 08/09/2019  . Subdural hematoma, acute ([facility]) 08/09/2019  . Healthcare maintenance 06/20/2019  . History of strabismus surgery 03/31/2019  . Moderate, recurrent MDD 06/06/2018  . 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 Osteoarthritis of right knee 06/05/2018  . Essential hypertension 06/05/2018  . Anxiety 06/05/2018  . Age-related nuclear cataract of both eyes 11/26/2014  . Diplopia 11/26/2014  . Monocular esotropia of left eye 11/26/2014  . Corneal transplant failure 10/27/2011    Willow Ora, PTA, [facility] [address], [place], [place] [address] [phone] 03/11/20, 9:13 PM   Name: Michelle Carrillo MRN: [id number] Date of Birth: [birth year]

## 2020-03-12 ENCOUNTER — Encounter: Payer: Self-pay | Admitting: Internal Medicine

## 2020-03-13 ENCOUNTER — Other Ambulatory Visit: Payer: Self-pay

## 2020-03-13 ENCOUNTER — Encounter: Payer: Medicare Other | Admitting: Student

## 2020-03-13 ENCOUNTER — Encounter: Payer: Self-pay | Admitting: Internal Medicine

## 2020-03-13 ENCOUNTER — Ambulatory Visit (INDEPENDENT_AMBULATORY_CARE_PROVIDER_SITE_OTHER): Payer: Medicare Other | Admitting: Internal Medicine

## 2020-03-13 VITALS — BP 122/64 | HR 71 | Temp 97.6°F | Ht 59.0 in | Wt 160.3 lb

## 2020-03-13 DIAGNOSIS — B0233 Zoster keratitis: Secondary | ICD-10-CM

## 2020-03-13 DIAGNOSIS — H5710 Ocular pain, unspecified eye: Secondary | ICD-10-CM

## 2020-03-13 DIAGNOSIS — Z8619 Personal history of other infectious and parasitic diseases: Secondary | ICD-10-CM | POA: Diagnosis not present

## 2020-03-13 NOTE — Patient Instructions (Signed)
[name],   Thanks for seen today.  I have given you a referral to see an ophthalmologist and you should receive a call once that appointment has been set up.  In the meantime, if the pain in your eye continues to worsen and your vision worsens, please call us or report to the emergency department.  Take care! Dr. Eileen Stanford  Please call the internal medicine center clinic if you have any questions or concerns, we may be able to help and keep you from a long and expensive emergency room wait. Our clinic and after hours phone number is [phone], the best time to call is Monday through Friday 9 am to 4 pm but there is always someone available 24/7 if you have an emergency. If you need medication refills please notify your pharmacy one week in advance and they will send [place] a request.   If you have not gotten the COVID vaccine, I recommend doing so:  You may get it at your local [facility] or [facility] OR To schedule an appointment for a COVID vaccine or be added to the vaccine wait list: Go to [email or web address]   OR Go to [email or web address]                  OR Call [phone]                                     OR Call [phone] and select Option 2

## 2020-03-13 NOTE — Progress Notes (Signed)
Internal Medicine Clinic Attending  Case discussed with Dr. Agyei at the time of the visit.  We reviewed the resident's history and exam and pertinent patient test results.  I agree with the assessment, diagnosis, and plan of care documented in the resident's note.  Thatiana Renbarger, M.D., Ph.D.  

## 2020-03-13 NOTE — Progress Notes (Signed)
   CC: Eye pain  HPI:  [name] is a 69 y.o. with medical history significant for herpes zoster keratitis of the left eye status post corneal transplant in 2015, bilateral cataracts, history of strabismus surgery who presented to request for referral to see ophthalmologist in [place].  Please see problem based charting for further details.  Past Medical History:  Diagnosis Date  . Anxiety   . Depression   . Herpes zoster kerstitis s/p corneal transplant 2015   . Hypertension   . Right knee osteoarthritis   . Strabismus    due to eye injury as a child  . Stroke [facility])    Review of Systems: As per HPI  Physical Exam:  Vitals:   03/13/20 0856  BP: 122/64  Pulse: 71  Temp: 97.6 F (36.4 C)  TempSrc: Oral  SpO2: 100%  Weight: 160 lb 4.8 oz (72.7 kg)  Height: 4\' 11"  (1.499 m)   Physical Exam Constitutional:      Appearance: Normal appearance.  HENT:     Head: Normocephalic and atraumatic. No raccoon eyes, Battle's sign, abrasion, contusion, masses, right periorbital erythema, left periorbital erythema or laceration.  Eyes:     General: Lids are normal.        Right eye: No discharge or hordeolum.        Left eye: No discharge or hordeolum.     Extraocular Movements:     Right eye: Normal extraocular motion.     Left eye: Normal extraocular motion.     Conjunctiva/sclera: Conjunctivae normal.     Right eye: Right conjunctiva is not injected.     Left eye: Left conjunctiva is not injected.     Comments: Visual acuity difficulty to assess given her residual vision abnormalities with her recent stroke 5 to 6 months ago.  Neurological:     Mental Status: She is alert.     Assessment & Plan:   See Encounters Tab for problem based charting.  Patient discussed with Dr. Rebeca Alert

## 2020-03-13 NOTE — Assessment & Plan Note (Signed)
Eye pain: Michelle Carrillo has a medical history significant for herpes zoster keratitis of the left eye status post corneal transplant in 2015, bilateral cataracts, history of strabismus surgery for which she follows up with [facility] who presented to request for referral to see ophthalmologist in [place].  Her last follow-up with [facility] was in October 11, 2019.  Patient's daughter states that due to difficulty with transportation, they found it quite difficult having regular follow-up with [facility].  She states that for the past 3 weeks, she has noticed pain in her eye which is intermittent and at times would experience white discharge.  She rates the pain at 5/10.  She denies facial rash, fevers, chills, vision changes.  She does not report of pain with extraocular movement.  Daughter reports that mother had a stroke about 5 or 6 months ago and since then, she has experienced residual vision abnormalities and sometimes hemineglect.  On physical exams, extraocular movements were intact, pupils were equal round and reactive to light, no obvious lesions were appreciated  Assessment and plan: Referral has been placed to ophthalmology.  She was also given strict return precautions for which she agreed.

## 2020-03-14 ENCOUNTER — Ambulatory Visit: Payer: Medicare Other | Admitting: Physical Therapy

## 2020-03-14 DIAGNOSIS — I6609 Occlusion and stenosis of unspecified middle cerebral artery: Secondary | ICD-10-CM | POA: Diagnosis not present

## 2020-03-14 DIAGNOSIS — Z947 Corneal transplant status: Secondary | ICD-10-CM | POA: Diagnosis not present

## 2020-03-17 ENCOUNTER — Other Ambulatory Visit: Payer: Self-pay

## 2020-03-17 ENCOUNTER — Ambulatory Visit: Payer: Medicare Other

## 2020-03-17 ENCOUNTER — Encounter: Payer: Self-pay | Admitting: Speech Pathology

## 2020-03-17 ENCOUNTER — Ambulatory Visit: Payer: Medicare Other | Attending: Adult Health | Admitting: Speech Pathology

## 2020-03-17 DIAGNOSIS — R2689 Other abnormalities of gait and mobility: Secondary | ICD-10-CM | POA: Insufficient documentation

## 2020-03-17 DIAGNOSIS — R41842 Visuospatial deficit: Secondary | ICD-10-CM | POA: Diagnosis not present

## 2020-03-17 DIAGNOSIS — M6281 Muscle weakness (generalized): Secondary | ICD-10-CM | POA: Diagnosis not present

## 2020-03-17 DIAGNOSIS — R278 Other lack of coordination: Secondary | ICD-10-CM | POA: Diagnosis not present

## 2020-03-17 DIAGNOSIS — R41840 Attention and concentration deficit: Secondary | ICD-10-CM | POA: Diagnosis not present

## 2020-03-17 DIAGNOSIS — R2681 Unsteadiness on feet: Secondary | ICD-10-CM | POA: Diagnosis not present

## 2020-03-17 DIAGNOSIS — H539 Unspecified visual disturbance: Secondary | ICD-10-CM | POA: Insufficient documentation

## 2020-03-17 DIAGNOSIS — R41844 Frontal lobe and executive function deficit: Secondary | ICD-10-CM | POA: Insufficient documentation

## 2020-03-17 DIAGNOSIS — I69398 Other sequelae of cerebral infarction: Secondary | ICD-10-CM | POA: Insufficient documentation

## 2020-03-17 DIAGNOSIS — R41841 Cognitive communication deficit: Secondary | ICD-10-CM | POA: Insufficient documentation

## 2020-03-17 NOTE — Therapy (Signed)
[facility] [address] [place], [place], [address] Phone: [phone]   Fax:  [phone]  Physical Therapy Treatment  Patient Details  Name: Michelle Carrillo MRN: [id number] Date of Birth: 1951-03-14 Referring Provider (PT): Referred by Joni Reining, DO. Followed by Frann Rider, NP   Encounter Date: 03/17/2020   PT End of Session - 03/17/20 1402    Visit Number 5    Number of Visits 17    Date for PT Re-Evaluation 05/18/20   POC for 8 weeks, Cert for 90 days   Authorization Type Medicare (10th Visit PN)    Progress Note Due on Visit 10    PT Start Time 1401    PT Stop Time 1441    PT Time Calculation (min) 40 min    Equipment Utilized During Treatment Gait belt    Activity Tolerance Patient tolerated treatment well    Behavior During Therapy WFL for tasks assessed/performed           Past Medical History:  Diagnosis Date  . Anxiety   . Depression   . Herpes zoster kerstitis s/p corneal transplant 2015   . Hypertension   . Right knee osteoarthritis   . Strabismus    due to eye injury as a child  . Stroke [facility])     Past Surgical History:  Procedure Laterality Date  . CORNEAL TRANSPLANT Left    2006 and 2016  . CRANIOTOMY Left 08/09/2019   Procedure: CRANIOTOMY HEMATOMA EVACUATION SUBDURAL;  Surgeon: Ashok Pall, MD;  Location: [facility];  Service: Neurosurgery;  Laterality: Left;  left    There were no vitals filed for this visit.   Subjective Assessment - 03/17/20 1402    Subjective Patient reports no changes since last visit. Patient reports been doing well. No falls. No pain.    Patient is accompained by: Interpreter;Family member   Wilhemena Durie, [place] Contracted Therapy   Pertinent History HTN, Anxiety, Depression, OA, Strabismus    Limitations Walking    Patient Stated Goals walk better    Currently in Pain? No/denies              [facility] Adult PT Treatment/Exercise - 03/17/20 1407       Transfers   Transfers Sit to Stand;Stand to Sit    Sit to Stand 5: Supervision    Stand to Sit 5: Supervision      Ambulation/Gait   Ambulation/Gait Yes    Ambulation/Gait Assistance 5: Supervision    Ambulation/Gait Assistance Details completed ambulation around therapy gym visual scanning for numbered cards throughout therapy gym, completed one lap through with patient missing 5-6 cards throughout scanning task. Completed second lap, with PT providing intermittent cues for improved scanning and locating card. Patient able to locate all cards on second lap throughout gym. Supervision throughout with completion.     Ambulation Distance (Feet) 350 Feet    Assistive device None    Gait Pattern Within Functional Limits    Ambulation Surface Level;Indoor      Balance   Balance Assessed Yes      Standardized Balance Assessment   Standardized Balance Assessment Berg Balance Test      Berg Balance Test   Sit to Stand Able to stand without using hands and stabilize independently    Standing Unsupported Able to stand safely 2 minutes    Sitting with Back Unsupported but Feet Supported on Floor or Stool Able to sit safely and securely 2 minutes  Stand to Sit Sits safely with minimal use of hands    Transfers Able to transfer safely, minor use of hands    Standing Unsupported with Eyes Closed Able to stand 10 seconds safely    Standing Ubsupported with Feet Together Able to place feet together independently and stand 1 minute safely    From Standing, Reach Forward with Outstretched Arm Can reach forward >12 cm safely (5")    From Standing Position, Pick up Object from Floor Able to pick up shoe safely and easily    From Standing Position, Turn to Look Behind Over each Shoulder Looks behind one side only/other side shows less weight shift    Turn 360 Degrees Able to turn 360 degrees safely but slowly    Standing Unsupported, Alternately Place Feet on Step/Stool Able to stand independently and  complete 8 steps >20 seconds    Standing Unsupported, One Foot in Front Able to plae foot ahead of the other independently and hold 30 seconds    Standing on One Leg Tries to lift leg/unable to hold 3 seconds but remains standing independently    Total Score 47      Exercises   Exercises Other Exercises    Other Exercises  Reviewed current HEP for compliance. Patient and daughter explaining difficulty iwth compliance. PT educating on ways to promote compliance and provided HEP tracker to allow for improved techniques to help remember to complete exercises.             Access Code: [email or web address] URL: [email or web address] Date: 03/05/2020 Prepared by: Baldomero Lamy  Exercises Tandem Walking with Counter Support - 1 x daily - 5 x weekly - 1 sets - 6 reps Walking with Head Rotation - 1 x daily - 5 x weekly - 1 sets - 10 reps Romberg Stance with Eyes Closed - 1 x daily - 5 x weekly - 1 sets - 3 reps - 30 hold Standing Balance with Eyes Closed on Foam - 1 x daily - 5 x weekly - 1 sets - 3 reps - 30 hold       PT Education - 03/17/20 1832    Education Details HEP review; progress toward STGs    Person(s) Educated Patient;Child(ren)    Methods Explanation;Handout    Comprehension Verbalized understanding            PT Short Term Goals - 03/17/20 1411      PT SHORT TERM GOAL #1   Title patient will be independent with inital HEP for strengthening/balance (ALL STGS Due: 03/17/20)    Baseline decreased compliance, completing 2-3x/weekly    Time 4    Period Weeks    Status Partially Met    Target Date 03/17/20      PT SHORT TERM GOAL #2   Title Patient will improve Berg Balance to >/= 45/56 to demonstrate reduced fall risk    Baseline 43/56; 47/56    Time 4    Period Weeks    Status Achieved      PT SHORT TERM GOAL #3   Title Patient will demonstrate ability to ambulate x 300 ft without AD and demo proper visual scanning to allow for improved safety with  household/community mobility and compensate for visual deficits    Baseline decreased scanning noted, able to ambulate x 300 ft    Time 4    Period Weeks    Status Partially Met             PT Long  Term Goals - 02/18/20 1814      PT LONG TERM GOAL #1   Title Patient will be independent with final HEP for strengthening/balance (ALL LTGS DUE: 04/14/2020)    Baseline no HEP established    Time 8    Period Weeks    Status New    Target Date 04/14/20      PT LONG TERM GOAL #2   Title Patient will improve TUG to </= 12 seconds without AD to demosntrate reduced fall risk    Baseline 14.32 secs    Time 8    Period Weeks    Status New      PT LONG TERM GOAL #3   Title Patient will improve gait speed to >/= 2.7 ft/sec to demonstrate improved community mobility    Baseline 2.14 ft/sec    Time 8    Period Weeks    Status New      PT LONG TERM GOAL #4   Title Patient will improve Berg Balance Score to >/= 50/56 to demonstrate reduced risk for falls and improved balance    Baseline 43/56    Time 8    Period Weeks    Status New      PT LONG TERM GOAL #5   Title Patient will be able to complete 5x sit <> stand in </= 12 seconds without UE support to demonstrate improved balance    Baseline 16.9 secs    Time 8    Period Weeks    Status New                 Plan - 03/17/20 1836    Clinical Impression Statement Today's skilled PT session included assessment of patient's progress toward all STGs. Patient able to meet STG #2 and demonstrating progress toward STG #1 and #3. Patient is demonstrating improvements regarding balance but continues to require increased cueing for activities including visual scanning with ambulation. PT educating on HEP compliance and provided HEP tracker to compensate for impaired memory. Patient will continue to benefit from skilled PT services to progress toward all LTGs.    Personal Factors and Comorbidities Comorbidity 3+    Comorbidities HTN,  Anxiety, Depression, OA, Strabismus    Examination-Activity Limitations Caring for Others;Locomotion Level;Stairs;Transfers    Examination-Participation Restrictions Cleaning;Community Activity;Medication Management    Stability/Clinical Decision Making Stable/Uncomplicated    Rehab Potential Good    PT Frequency 2x / week    PT Duration 8 weeks    PT Treatment/Interventions ADLs/Self Care Home Management;DME Instruction;Stair training;Gait training;Functional mobility training;Therapeutic activities;Balance training;Neuromuscular re-education;Therapeutic exercise;Patient/family education;Orthotic Fit/Training;Manual techniques;Vestibular;Passive range of motion    PT Next Visit Plan how did HEP tracker work? continued balance exercises. visual scanning. review HEP when daughter attends (either verbally or have pt perform to ensure it's being done at home). continue to work on high level balance activities    PT Home Exercise Plan Access Code: [place] and Agree with Plan of Care Family member/caregiver;Patient    Family Member Consulted Daughter           Patient will benefit from skilled therapeutic intervention in order to improve the following deficits and impairments:  Abnormal gait, Decreased balance, Decreased mobility, Difficulty walking, Decreased endurance, Pain, Decreased strength, Decreased safety awareness, Decreased knowledge of use of DME, Decreased activity tolerance, Decreased range of motion, Impaired vision/preception  Visit Diagnosis: Other abnormalities of gait and mobility  Unsteadiness on feet  Muscle weakness (generalized)  Problem List Patient Active Problem List   Diagnosis Date Noted  . Eye pain 03/13/2020  . Hyperkalemia 01/14/2020  . Chronic RUQ pain 01/14/2020  . Sensation of fullness in ear, bilateral 01/11/2020  . Aphasia due to acute stroke ([place]) 08/23/2019  . Hyperlipemia 08/23/2019  . ICH (intracerebral hemorrhage) (HCC) L  parietal w/ L SDH s/p crani 08/09/2019  . Subdural hematoma, acute ([place]) 08/09/2019  . Healthcare maintenance 06/20/2019  . History of strabismus surgery 03/31/2019  . Moderate, recurrent MDD 06/06/2018  . 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 Osteoarthritis of right knee 06/05/2018  . Essential hypertension 06/05/2018  . Anxiety 06/05/2018  . Age-related nuclear cataract of both eyes 11/26/2014  . Diplopia 11/26/2014  . Monocular esotropia of left eye 11/26/2014  . Corneal transplant failure 10/27/2011    Jones Bales, PT, DPT 03/17/2020, 6:37 PM  [facility] [address] [place], [place], [address] Phone: [phone]   Fax:  [phone]  Name: Michelle Carrillo MRN: [id number] Date of Birth: December 29, 1950

## 2020-03-17 NOTE — Patient Instructions (Signed)
   Cook 1 item at a time  Have all of the ingredients and supplies out before you start   Make a mental plan for what you are going to do 1st, next and so on..  Start dinner a little earlier  Take breaks to rest in between jobs - rest 10 minutes every 30 minutes in quiet place to let your brain and body re-group  Rest before mopping and after mopping, in between cleaning bathrooms, or anything physically or mentally taxing  Use mindful breathing to relax (Breathe2Relax app)

## 2020-03-17 NOTE — Therapy (Signed)
[facility] [address] [place], [place], [address] Phone: [phone]   Fax:  [phone]  Speech Language Pathology Treatment  Patient Details  Name: Michelle Carrillo MRN: [id number] Date of Birth: 04/09/1951 Referring Provider (SLP): Frann Rider, NP   Encounter Date: 03/17/2020   End of Session - 03/17/20 1406    Visit Number 4    Number of Visits 17    Date for SLP Re-Evaluation 04/14/20    SLP Start Time 15    SLP Stop Time  1400    SLP Time Calculation (min) 42 min    Activity Tolerance Patient tolerated treatment well           Past Medical History:  Diagnosis Date  . Anxiety   . Depression   . Herpes zoster kerstitis s/p corneal transplant 2015   . Hypertension   . Right knee osteoarthritis   . Strabismus    due to eye injury as a child  . Stroke [facility])     Past Surgical History:  Procedure Laterality Date  . CORNEAL TRANSPLANT Left    2006 and 2016  . CRANIOTOMY Left 08/09/2019   Procedure: CRANIOTOMY HEMATOMA EVACUATION SUBDURAL;  Surgeon: Ashok Pall, MD;  Location: [facility];  Service: Neurosurgery;  Laterality: Left;  left    There were no vitals filed for this visit.   Subjective Assessment - 03/17/20 1322    Subjective "She is worried about my sister who is traveling"    Patient is accompained by: Interpreter;Family member   daughter, Michelle Carrillo, interpreter   Currently in Pain? No/denies                 ADULT SLP TREATMENT - 03/17/20 1324      General Information   Behavior/Cognition Alert;Cooperative;Pleasant mood      Treatment Provided   Treatment provided Cognitive-Linquistic      Cognitive-Linquistic Treatment   Treatment focused on Cognition;Patient/family/caregiver education    Skilled Treatment Pt and daughter report success using calendar for appointments and orientation. She continues to report slowness with processing cooking. Generated strategies to  maximize attention and working memory in kitchen (see pt instructions). Michelle Carrillo reports [name] is inconsistent with PT HEP. Generate exercise log to track HEP and for family to assist pt in completing HEP. Emphasized importance of HEP to gain endurance and balance. Energy conservation strategies iniitated today.  Pt is managing medications with rare min A from family, not missing any doses      Assessment / Recommendations / Plan   Plan Continue with current plan of care      Progression Toward Goals   Progression toward goals Progressing toward goals              SLP Short Term Goals - 03/17/20 1402      SLP SHORT TERM GOAL #1   Title Pt wil recall and request am meds with occasional min A from caregivers over 3 sessions    Time 3    Period Weeks    Status Achieved      SLP SHORT TERM GOAL #2   Title Pt and family will carryover 3 compensations for auditory comprehension so pt can accurately follow directions and understand daily plan    Time 2    Period Weeks    Status On-going      SLP SHORT TERM GOAL #3   Title Pt will be oriented to day and time as measured by her verbalizing times  she needs to get dressed and leave for her appointments with compensatory strategies and cues from family    Baseline 03/17/20    Time 2    Period Weeks    Status On-going      SLP SHORT TERM GOAL #4   Title Complete CLQT    Time 1    Period Weeks    Status Partially Met            SLP Long Term Goals - 03/17/20 1403      SLP LONG TERM GOAL #1   Title Pt will use external aids to be oriented to date, time and daily plan with rare min A from family    Time 6    Period Weeks    Status On-going      SLP LONG TERM GOAL #2   Title Pt will get ready for appointments and daily tasks in timely manner with rare min A from family    Time 6    Period Weeks    Status On-going      SLP LONG TERM GOAL #3   Title Pt will ID and correct language errors in converation with occasional min A from  family over 2 sessions    Time 6    Period Weeks    Status On-going            Plan - 03/17/20 1406    Clinical Impression Statement Michelle Carrillo is referred for outpt ST s/p SDH/craniotomy due to ongoing cognitive communication impairments. Daughter did not attend therapy with pt today (daughter stated she had an interpretation job and dropped pt off), so SLP could not verify anything pt stated during session. SLP sent note home with pt indicating that it is helpful for daughter to attend with her mother to [place]. I recommend cont'd skilled ST to maximize cognitive linguistic skills for safety, QOL and to reduce caregiver burden    Speech Therapy Frequency 2x / week    Treatment/Interventions SLP instruction and feedback;Compensatory strategies;Functional tasks;Cognitive reorganization;Compensatory techniques;Cueing hierarchy;Environmental controls;Patient/family education;Multimodal communcation approach;Internal/external aids    Potential to Achieve Goals Good    Potential Considerations Ability to learn/carryover information    Consulted and Agree with Plan of Care Patient;Family member/caregiver    Family Member Consulted daughter Michelle Carrillo           Patient will benefit from skilled therapeutic intervention in order to improve the following deficits and impairments:   Cognitive communication deficit    Problem List Patient Active Problem List   Diagnosis Date Noted  . Eye pain 03/13/2020  . Hyperkalemia 01/14/2020  . Chronic RUQ pain 01/14/2020  . Sensation of fullness in ear, bilateral 01/11/2020  . Aphasia due to acute stroke ([place]) 08/23/2019  . Hyperlipemia 08/23/2019  . ICH (intracerebral hemorrhage) (HCC) L parietal w/ L SDH s/p crani 08/09/2019  . Subdural hematoma, acute ([place]) 08/09/2019  . Healthcare maintenance 06/20/2019  . History of strabismus surgery 03/31/2019  . Moderate, recurrent MDD 06/06/2018  . Herpes zoster keratitis of left eye s/p corneal  transplant 2015 06/06/2018  . Osteoarthritis of right knee 06/05/2018  . Essential hypertension 06/05/2018  . Anxiety 06/05/2018  . Age-related nuclear cataract of both eyes 11/26/2014  . Diplopia 11/26/2014  . Monocular esotropia of left eye 11/26/2014  . Corneal transplant failure 10/27/2011    Durwin Davisson, Annye Rusk MS< CCC-SLP 03/17/2020, 2:07 PM  [facility] [address] [place], [place], [address] Phone: [phone]  Fax:  [phone]   Name: Michelle Carrillo MRN: [id number] Date of Birth: Jun 06, 1950

## 2020-03-19 ENCOUNTER — Other Ambulatory Visit: Payer: Self-pay

## 2020-03-19 ENCOUNTER — Encounter: Payer: Self-pay | Admitting: Speech Pathology

## 2020-03-19 ENCOUNTER — Ambulatory Visit: Payer: Medicare Other | Admitting: Speech Pathology

## 2020-03-19 ENCOUNTER — Ambulatory Visit: Payer: Medicare Other

## 2020-03-19 DIAGNOSIS — I69398 Other sequelae of cerebral infarction: Secondary | ICD-10-CM | POA: Diagnosis not present

## 2020-03-19 DIAGNOSIS — M6281 Muscle weakness (generalized): Secondary | ICD-10-CM | POA: Diagnosis not present

## 2020-03-19 DIAGNOSIS — R2689 Other abnormalities of gait and mobility: Secondary | ICD-10-CM

## 2020-03-19 DIAGNOSIS — R41841 Cognitive communication deficit: Secondary | ICD-10-CM

## 2020-03-19 DIAGNOSIS — R41842 Visuospatial deficit: Secondary | ICD-10-CM | POA: Diagnosis not present

## 2020-03-19 DIAGNOSIS — R41840 Attention and concentration deficit: Secondary | ICD-10-CM | POA: Diagnosis not present

## 2020-03-19 DIAGNOSIS — R41844 Frontal lobe and executive function deficit: Secondary | ICD-10-CM | POA: Diagnosis not present

## 2020-03-19 DIAGNOSIS — R2681 Unsteadiness on feet: Secondary | ICD-10-CM

## 2020-03-19 NOTE — Patient Instructions (Addendum)
    Get the persons attention before you speak  Use eye contact and face the person you are speaking to  Be in close proximity to the person you are speaking to  Turn down any noise in the environment such as the TV, walk away from loud appliances, air conditioners, fans, dish washers etc  Repeat back what you have heard  Avoid important conversations when you are doing a chore or task  Use shorter utterances and pauses, the check that she understood  Group conversations may be harder to process and cause fatigue  Use a relaxed calm voice and manner to talk to [facility] and give her time to process  Bring in your notebook with exercises and log  Help Francesca notice when she says the wrong word

## 2020-03-19 NOTE — Therapy (Signed)
[facility] [address] [place], [place], [address] Phone: [phone]   Fax:  [phone]  Speech Language Pathology Treatment  Patient Details  Name: Michelle Carrillo MRN: [id number] Date of Birth: 1950-09-15 Referring Provider (SLP): Frann Rider, NP   Encounter Date: 03/19/2020   End of Session - 03/19/20 1601    Visit Number 5    Number of Visits 17    Date for SLP Re-Evaluation 04/14/20    SLP Start Time 1320    SLP Stop Time  1400    SLP Time Calculation (min) 40 min           Past Medical History:  Diagnosis Date  . Anxiety   . Depression   . Herpes zoster kerstitis s/p corneal transplant 2015   . Hypertension   . Right knee osteoarthritis   . Strabismus    due to eye injury as a child  . Stroke [facility])     Past Surgical History:  Procedure Laterality Date  . CORNEAL TRANSPLANT Left    2006 and 2016  . CRANIOTOMY Left 08/09/2019   Procedure: CRANIOTOMY HEMATOMA EVACUATION SUBDURAL;  Surgeon: Ashok Pall, MD;  Location: [facility];  Service: Neurosurgery;  Laterality: Left;  left    There were no vitals filed for this visit.   Subjective Assessment - 03/19/20 1324    Subjective "I'll be the interpreter"    Patient is accompained by: Family member   daughter, [name]   Currently in Pain? No/denies                 ADULT SLP TREATMENT - 03/19/20 1329      General Information   Behavior/Cognition Alert;Cooperative;Pleasant mood      Treatment Provided   Treatment provided Cognitive-Linquistic      Cognitive-Linquistic Treatment   Treatment focused on Cognition;Patient/family/caregiver education    Skilled Treatment Pt and daughter report pt has used HEP log for PT but did not bring it back in. Today we targeted word fidning and processing conversation. Educated and train [name] and [name] is strategies to maximize auditory comprhension and processing. See pt instructions. [name]  endorses difficulty following group conversations and distractions from back ground noises. They are to educate other family members on the strategies to help Evoleth process and recall verbally presented information.  [name] is using a calender to recall appointments, but sometimes calls her daughter to remind her. [name] states that her mom did this before her stroke as well. Encouraged her to use calendar before calling [name].       Assessment / Recommendations / Plan   Plan Continue with current plan of care      Progression Toward Goals   Progression toward goals Progressing toward goals            SLP Education - 03/19/20 1559    Education Details compensatiosns for processing, attending and recalling verbal infomration/conversation    Person(s) Educated Patient    Methods Explanation;Verbal cues;Handout    Comprehension Verbalized understanding;Verbal cues required            SLP Short Term Goals - 03/19/20 1600      SLP SHORT TERM GOAL #1   Title Pt wil recall and request am meds with occasional min A from caregivers over 3 sessions    Time 3    Period Weeks    Status Achieved      SLP SHORT TERM GOAL #2   Title Pt and  family will carryover 3 compensations for auditory comprehension so pt can accurately follow directions and understand daily plan    Time 2    Period Weeks    Status On-going      SLP SHORT TERM GOAL #3   Title Pt will be oriented to day and time as measured by her verbalizing times she needs to get dressed and leave for her appointments with compensatory strategies and cues from family    Baseline 03/17/20; 03/19/20    Time 2    Period Weeks    Status Achieved      SLP SHORT TERM GOAL #4   Title Complete CLQT    Time 1    Period Weeks    Status Partially Met            SLP Long Term Goals - 03/19/20 1600      SLP LONG TERM GOAL #1   Title Pt will use external aids to be oriented to date, time and daily plan with rare min A from family     Time 6    Period Weeks    Status On-going      SLP LONG TERM GOAL #2   Title Pt will get ready for appointments and daily tasks in timely manner with rare min A from family    Time 6    Period Weeks    Status On-going      SLP LONG TERM GOAL #3   Title Pt will ID and correct language errors in converation with occasional min A from family over 2 sessions    Time 6    Period Weeks    Status On-going            Plan - 03/19/20 1559    Clinical Impression Statement [name] is referred for outpt ST s/p SDH/craniotomy due to ongoing cognitive communication impairments. Daughter did not attend therapy with pt today (daughter stated she had an interpretation job and dropped pt off), so SLP could not verify anything pt stated during session. SLP sent note home with pt indicating that it is helpful for daughter to attend with her mother to [place]. I recommend cont'd skilled ST to maximize cognitive linguistic skills for safety, QOL and to reduce caregiver burden    Speech Therapy Frequency 2x / week    Duration 8 weeks   17 visits   Treatment/Interventions SLP instruction and feedback;Compensatory strategies;Functional tasks;Cognitive reorganization;Compensatory techniques;Cueing hierarchy;Environmental controls;Patient/family education;Multimodal communcation approach;Internal/external aids    Potential to Achieve Goals Good           Patient will benefit from skilled therapeutic intervention in order to improve the following deficits and impairments:   Cognitive communication deficit    Problem List Patient Active Problem List   Diagnosis Date Noted  . Eye pain 03/13/2020  . Hyperkalemia 01/14/2020  . Chronic RUQ pain 01/14/2020  . Sensation of fullness in ear, bilateral 01/11/2020  . Aphasia due to acute stroke ([facility]) 08/23/2019  . Hyperlipemia 08/23/2019  . ICH (intracerebral hemorrhage) (HCC) L parietal w/ L SDH s/p crani 08/09/2019  . Subdural hematoma, acute ([facility])  08/09/2019  . Healthcare maintenance 06/20/2019  . History of strabismus surgery 03/31/2019  . Moderate, recurrent MDD 06/06/2018  . 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 Osteoarthritis of right knee 06/05/2018  . Essential hypertension 06/05/2018  . Anxiety 06/05/2018  . Age-related nuclear cataract of both eyes 11/26/2014  . Diplopia 11/26/2014  .  Monocular esotropia of left eye 11/26/2014  . Corneal transplant failure 10/27/2011    Noeli Lavery, Annye Rusk MS, CCC-SLP 03/19/2020, 4:02 PM  [facility] [address] [place], [place], [address] Phone: [phone]   Fax:  [phone]   Name: KEYLEIGH MANNINEN MRN: [id number] Date of Birth: Sep 08, 1950

## 2020-03-19 NOTE — Therapy (Signed)
[facility] [address] [place], [place], [address] Phone: [phone]   Fax:  [phone]  Physical Therapy Treatment  Patient Details  Name: Michelle Carrillo MRN: [id number] Date of Birth: 09-Oct-1950 Referring Provider (PT): Referred by Joni Reining, DO. Followed by Frann Rider, NP   Encounter Date: 03/19/2020   PT End of Session - 03/19/20 1404    Visit Number 6    Number of Visits 17    Date for PT Re-Evaluation 05/18/20   POC for 8 weeks, Cert for 90 days   Authorization Type Medicare (10th Visit PN)    Progress Note Due on Visit 10    PT Start Time 1402    PT Stop Time 1443    PT Time Calculation (min) 41 min    Equipment Utilized During Treatment Gait belt    Activity Tolerance Patient tolerated treatment well    Behavior During Therapy WFL for tasks assessed/performed           Past Medical History:  Diagnosis Date  . Anxiety   . Depression   . Herpes zoster kerstitis s/p corneal transplant 2015   . Hypertension   . Right knee osteoarthritis   . Strabismus    due to eye injury as a child  . Stroke [facility])     Past Surgical History:  Procedure Laterality Date  . CORNEAL TRANSPLANT Left    2006 and 2016  . CRANIOTOMY Left 08/09/2019   Procedure: CRANIOTOMY HEMATOMA EVACUATION SUBDURAL;  Surgeon: Ashok Pall, MD;  Location: [facility];  Service: Neurosurgery;  Laterality: Left;  left    There were no vitals filed for this visit.   Subjective Assessment - 03/19/20 1404    Subjective Patient reports no new changes. No pain/falls. Patient reports exercises are going well, but reports pillow she is using is too thin.    Patient is accompained by: Interpreter;Family member   Wilhemena Durie, [place] Contracted Therapy   Pertinent History HTN, Anxiety, Depression, OA, Strabismus    Limitations Walking    Patient Stated Goals walk better    Currently in Pain? No/denies                 [facility] Adult PT  Treatment/Exercise - 03/19/20 0001      Transfers   Transfers Sit to Stand;Stand to Sit    Sit to Stand 5: Supervision    Stand to Sit 5: Supervision    Number of Reps 10 reps;1 set    Comments completed x 10 reps with BLE placed on airex. increased verbal cues required for proper completion.       Ambulation/Gait   Ambulation/Gait Yes    Ambulation/Gait Assistance 5: Supervision    Ambulation/Gait Assistance Details throughout therapy activites with session    Ambulation Distance (Feet) --   clinic distances   Assistive device None    Gait Pattern Within Functional Limits    Ambulation Surface Level;Indoor      High Level Balance   High Level Balance Activities Backward walking;Tandem walking;Marching forwards;Other (comment)    High Level Balance Comments In hallway completed the following 2 x 50' each including: backwards walking, side stepping; marching forwards, and tandem walking. increased verbal cues required for side stepping as patient often compensating with hip flexion vs. hip abduction. With tandem walking increased verbal cues required for placing feet closer together, as patient demo increased distance apart. Increased balance challenge noted with tandem walking > all other high level balance.  Neuro Re-ed    Neuro Re-ed Details  Standing on airex with wide BOS completed overhead B shoulder flexion with 1# weighted ball x 10 reps, Progressed to completing diagonal motion x 10 reps to B directions. Increased tactle/verbal cues and hand over hand guiding required for proper completion. Verbal cues for tracking with vision with completion. Standing on airex with wide BOS completing stance and bean bag toss, completed x 2 reps. First set completed with wide BOS, second set progressed to narrow BOS. Intermittent CGA required with narrow BOS.       Exercises   Exercises Knee/Hip      Knee/Hip Exercises: Standing   Heel Raises Both;2 sets;10 reps;3 seconds;Limitations     Heel Raises Limitations completed with BUE support from countertop; 3 second hold    Hip Abduction Stengthening;Both;2 sets;10 reps;Knee straight;Limitations    Abduction Limitations completd with light UE support from counter, verbal cues for completion               Balance Exercises - 03/19/20 0001      Balance Exercises: Standing   Tandem Stance Eyes closed;2 reps;25 secs;Limitations    Tandem Stance Time alternating stance foot; completed 2 x 25 secs bilaterally iwth vision removed. increased challenge with vision removed.                PT Short Term Goals - 03/17/20 1411      PT SHORT TERM GOAL #1   Title patient will be independent with inital HEP for strengthening/balance (ALL STGS Due: 03/17/20)    Baseline decreased compliance, completing 2-3x/weekly    Time 4    Period Weeks    Status Partially Met    Target Date 03/17/20      PT SHORT TERM GOAL #2   Title Patient will improve Berg Balance to >/= 45/56 to demonstrate reduced fall risk    Baseline 43/56; 47/56    Time 4    Period Weeks    Status Achieved      PT SHORT TERM GOAL #3   Title Patient will demonstrate ability to ambulate x 300 ft without AD and demo proper visual scanning to allow for improved safety with household/community mobility and compensate for visual deficits    Baseline decreased scanning noted, able to ambulate x 300 ft    Time 4    Period Weeks    Status Partially Met             PT Long Term Goals - 02/18/20 1814      PT LONG TERM GOAL #1   Title Patient will be independent with final HEP for strengthening/balance (ALL LTGS DUE: 04/14/2020)    Baseline no HEP established    Time 8    Period Weeks    Status New    Target Date 04/14/20      PT LONG TERM GOAL #2   Title Patient will improve TUG to </= 12 seconds without AD to demosntrate reduced fall risk    Baseline 14.32 secs    Time 8    Period Weeks    Status New      PT LONG TERM GOAL #3   Title Patient will  improve gait speed to >/= 2.7 ft/sec to demonstrate improved community mobility    Baseline 2.14 ft/sec    Time 8    Period Weeks    Status New      PT LONG TERM GOAL #4   Title Patient will  improve Berg Balance Score to >/= 50/56 to demonstrate reduced risk for falls and improved balance    Baseline 43/56    Time 8    Period Weeks    Status New      PT LONG TERM GOAL #5   Title Patient will be able to complete 5x sit <> stand in </= 12 seconds without UE support to demonstrate improved balance    Baseline 16.9 secs    Time 8    Period Weeks    Status New                 Plan - 03/19/20 1445    Clinical Impression Statement Today's skilled PT session focused on continued high-level balance and strengthening exercises. Patient continue to require increased verbal/tactile cues for proper completion. Increased balance challenge with tandem stance activites noted today. Will continue to progress toward all goals.    Personal Factors and Comorbidities Comorbidity 3+    Comorbidities HTN, Anxiety, Depression, OA, Strabismus    Examination-Activity Limitations Caring for Others;Locomotion Level;Stairs;Transfers    Examination-Participation Restrictions Cleaning;Community Activity;Medication Management    Stability/Clinical Decision Making Stable/Uncomplicated    Rehab Potential Good    PT Frequency 2x / week    PT Duration 8 weeks    PT Treatment/Interventions ADLs/Self Care Home Management;DME Instruction;Stair training;Gait training;Functional mobility training;Therapeutic activities;Balance training;Neuromuscular re-education;Therapeutic exercise;Patient/family education;Orthotic Fit/Training;Manual techniques;Vestibular;Passive range of motion    PT Next Visit Plan continued balance exercises. visual scanning. review HEP when daughter attends (either verbally or have pt perform to ensure it's being done at home). continue to work on high level balance activities    PT Home  Exercise Plan Access Code: [place] and Agree with Plan of Care Family member/caregiver;Patient    Family Member Consulted Daughter           Patient will benefit from skilled therapeutic intervention in order to improve the following deficits and impairments:  Abnormal gait, Decreased balance, Decreased mobility, Difficulty walking, Decreased endurance, Pain, Decreased strength, Decreased safety awareness, Decreased knowledge of use of DME, Decreased activity tolerance, Decreased range of motion, Impaired vision/preception  Visit Diagnosis: Other abnormalities of gait and mobility  Unsteadiness on feet  Muscle weakness (generalized)     Problem List Patient Active Problem List   Diagnosis Date Noted  . Eye pain 03/13/2020  . Hyperkalemia 01/14/2020  . Chronic RUQ pain 01/14/2020  . Sensation of fullness in ear, bilateral 01/11/2020  . Aphasia due to acute stroke ([facility]) 08/23/2019  . Hyperlipemia 08/23/2019  . ICH (intracerebral hemorrhage) (HCC) L parietal w/ L SDH s/p crani 08/09/2019  . Subdural hematoma, acute ([facility]) 08/09/2019  . Healthcare maintenance 06/20/2019  . History of strabismus surgery 03/31/2019  . Moderate, recurrent MDD 06/06/2018  . 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 Osteoarthritis of right knee 06/05/2018  . Essential hypertension 06/05/2018  . Anxiety 06/05/2018  . Age-related nuclear cataract of both eyes 11/26/2014  . Diplopia 11/26/2014  . Monocular esotropia of left eye 11/26/2014  . Corneal transplant failure 10/27/2011    Jones Bales, PT, DPT 03/19/2020, 2:47 PM  [facility] [address] [place], [place], [address] Phone: [phone]   Fax:  [phone]  Name: Michelle Carrillo MRN: [id number] Date of Birth: 05/18/1950

## 2020-03-24 ENCOUNTER — Other Ambulatory Visit: Payer: Self-pay

## 2020-03-24 ENCOUNTER — Ambulatory Visit: Payer: Medicare Other

## 2020-03-24 DIAGNOSIS — R41842 Visuospatial deficit: Secondary | ICD-10-CM | POA: Diagnosis not present

## 2020-03-24 DIAGNOSIS — R41841 Cognitive communication deficit: Secondary | ICD-10-CM | POA: Diagnosis not present

## 2020-03-24 DIAGNOSIS — M6281 Muscle weakness (generalized): Secondary | ICD-10-CM

## 2020-03-24 DIAGNOSIS — I69398 Other sequelae of cerebral infarction: Secondary | ICD-10-CM | POA: Diagnosis not present

## 2020-03-24 DIAGNOSIS — R2689 Other abnormalities of gait and mobility: Secondary | ICD-10-CM

## 2020-03-24 DIAGNOSIS — R41840 Attention and concentration deficit: Secondary | ICD-10-CM | POA: Diagnosis not present

## 2020-03-24 DIAGNOSIS — R2681 Unsteadiness on feet: Secondary | ICD-10-CM

## 2020-03-24 DIAGNOSIS — R41844 Frontal lobe and executive function deficit: Secondary | ICD-10-CM | POA: Diagnosis not present

## 2020-03-24 NOTE — Patient Instructions (Signed)
Llame la atencin de las personas antes de hablar  Utilice el contacto visual y mire a la persona con la que est hablando.  Mantngase cerca de la persona con la que est hablando.  Baje cualquier ruido del entorno, como la televisin, [place] de electrodomsticos ruidosos, aires acondicionados, ventiladores, lavavajillas, etc.  Repite lo que has escuchado  Evite las conversaciones importantes cuando est haciendo una tarea o una tarea.  Use expresiones y pausas ms breves, la verificacin de que ella entendi  Las conversaciones grupales pueden ser ms difciles de procesar y causar fatiga  Use una voz tranquila y relajada para hablar con Lynn Ito y dle tiempo para procesar

## 2020-03-24 NOTE — Therapy (Signed)
[facility] [address] [place], [place], [address] Phone: [phone]   Fax:  [phone]  Physical Therapy Treatment  Patient Details  Name: Michelle Carrillo MRN: [id number] Date of Birth: [birth year] Referring Provider (PT): Referred by Joni Reining, DO. Followed by Frann Rider, NP   Encounter Date: 03/24/2020   PT End of Session - 03/24/20 1320    Visit Number 7    Number of Visits 17    Date for PT Re-Evaluation 05/18/20   POC for 8 weeks, Cert for 90 days   Authorization Type Medicare (10th Visit PN)    Progress Note Due on Visit 10    PT Start Time 9678    PT Stop Time 1400    PT Time Calculation (min) 43 min    Equipment Utilized During Treatment Gait belt    Activity Tolerance Patient tolerated treatment well    Behavior During Therapy WFL for tasks assessed/performed           Past Medical History:  Diagnosis Date  . Anxiety   . Depression   . Herpes zoster kerstitis s/p corneal transplant 2015   . Hypertension   . Right knee osteoarthritis   . Strabismus    due to eye injury as a child  . Stroke [facility])     Past Surgical History:  Procedure Laterality Date  . CORNEAL TRANSPLANT Left    2006 and 2016  . CRANIOTOMY Left 08/09/2019   Procedure: CRANIOTOMY HEMATOMA EVACUATION SUBDURAL;  Surgeon: Ashok Pall, MD;  Location: [facility];  Service: Neurosurgery;  Laterality: Left;  left    There were no vitals filed for this visit.   Subjective Assessment - 03/24/20 1320    Subjective No new changes. No falls. Patient reports still using one pillow for balance, two pillows feels unsafe.    Patient is accompained by: Interpreter;Family member   Wilhemena Durie, [place] Contracted Therapy   Pertinent History HTN, Anxiety, Depression, OA, Strabismus    Limitations Walking    Patient Stated Goals walk better    Currently in Pain? No/denies               [facility] Adult PT Treatment/Exercise - 03/24/20 0001       Transfers   Transfers Sit to Stand;Stand to Sit    Sit to Stand 6: Modified independent (Device/Increase time)    Stand to Sit 6: Modified independent (Device/Increase time)      Ambulation/Gait   Ambulation/Gait Yes    Ambulation/Gait Assistance 5: Supervision    Ambulation/Gait Assistance Details with high level balance    Assistive device None    Gait Pattern Within Functional Limits    Ambulation Surface Level;Indoor      High Level Balance   High Level Balance Activities Negotiating over obstacles    High Level Balance Comments Completed negotiating/stepping over orange hurdles 3 x 4 laps through, patient often leading with RLE. CGA throughout completion. Completed step over cones with added toe tap to further promote SLS. Max verbal cues required and demonstration for proper completion, as patient often completing toe tap and then stepping to side or circumducting to complete. completed x 2 laps down and back. Increased CGA due to increased balance challenge with SLS activity.      Exercises   Exercises Knee/Hip      Knee/Hip Exercises: Aerobic   Other Aerobic Completed SciFit with BLE Only on Level 2.0 x 6 mintues for improved BLE strengthening and endurance training.  Patient tolerating well. RPM > 55 bpm throughout completion.               Balance Exercises - 03/24/20 1336      Balance Exercises: Standing   Standing Eyes Opened Narrow base of support (BOS);Foam/compliant surface;Head turns;Limitations    Standing Eyes Opened Limitations completed standing horizontal/vertical head turns with eyes open, 2 x 10 reps each.    Standing Eyes Closed Narrow base of support (BOS);Foam/compliant surface;3 reps;30 secs    Standing Eyes Closed Limitations increased sway with vision removed    Tandem Stance Eyes open;Intermittent upper extremity support;3 reps;30 secs;Limitations    Tandem Stance Time alternating foot position with completion, intermittent UE support and CGA  from PT.    Tandem Gait Forward;4 reps;Limitations    Tandem Gait Limitations completed x 4 laps down and back. intermittent UE support due to imbalance.    Sidestepping Foam/compliant support;Upper extremity support;3 reps;Limitations    Sidestepping Limitations with blue balance beam, completed x 3 laps down and back with light UE support.               PT Short Term Goals - 03/17/20 1411      PT SHORT TERM GOAL #1   Title patient will be independent with inital HEP for strengthening/balance (ALL STGS Due: 03/17/20)    Baseline decreased compliance, completing 2-3x/weekly    Time 4    Period Weeks    Status Partially Met    Target Date 03/17/20      PT SHORT TERM GOAL #2   Title Patient will improve Berg Balance to >/= 45/56 to demonstrate reduced fall risk    Baseline 43/56; 47/56    Time 4    Period Weeks    Status Achieved      PT SHORT TERM GOAL #3   Title Patient will demonstrate ability to ambulate x 300 ft without AD and demo proper visual scanning to allow for improved safety with household/community mobility and compensate for visual deficits    Baseline decreased scanning noted, able to ambulate x 300 ft    Time 4    Period Weeks    Status Partially Met             PT Long Term Goals - 02/18/20 1814      PT LONG TERM GOAL #1   Title Patient will be independent with final HEP for strengthening/balance (ALL LTGS DUE: 04/14/2020)    Baseline no HEP established    Time 8    Period Weeks    Status New    Target Date 04/14/20      PT LONG TERM GOAL #2   Title Patient will improve TUG to </= 12 seconds without AD to demosntrate reduced fall risk    Baseline 14.32 secs    Time 8    Period Weeks    Status New      PT LONG TERM GOAL #3   Title Patient will improve gait speed to >/= 2.7 ft/sec to demonstrate improved community mobility    Baseline 2.14 ft/sec    Time 8    Period Weeks    Status New      PT LONG TERM GOAL #4   Title Patient will improve  Berg Balance Score to >/= 50/56 to demonstrate reduced risk for falls and improved balance    Baseline 43/56    Time 8    Period Weeks    Status New  PT LONG TERM GOAL #5   Title Patient will be able to complete 5x sit <> stand in </= 12 seconds without UE support to demonstrate improved balance    Baseline 16.9 secs    Time 8    Period Weeks    Status New                 Plan - 03/24/20 1413    Clinical Impression Statement Today's skilled PT session focused on continued high-level balance and strengthening exercises, continue to progress as tolerated. Patients continue to demo increased balance challenge on complaint surfaces especially with vision removed. Increased verbal cues continue to be required with activities, especially with SLS toe tap activity during ambulation. Will continue per POC and progress toward all LTGs.    Personal Factors and Comorbidities Comorbidity 3+    Comorbidities HTN, Anxiety, Depression, OA, Strabismus    Examination-Activity Limitations Caring for Others;Locomotion Level;Stairs;Transfers    Examination-Participation Restrictions Cleaning;Community Activity;Medication Management    Stability/Clinical Decision Making Stable/Uncomplicated    Rehab Potential Good    PT Frequency 2x / week    PT Duration 8 weeks    PT Treatment/Interventions ADLs/Self Care Home Management;DME Instruction;Stair training;Gait training;Functional mobility training;Therapeutic activities;Balance training;Neuromuscular re-education;Therapeutic exercise;Patient/family education;Orthotic Fit/Training;Manual techniques;Vestibular;Passive range of motion    PT Next Visit Plan continued balance exercises. visual scanning. continue to progress HEP as tolerated. SciFit for Endurance. BLE strengthening. SLS activites    PT Home Exercise Plan Access Code: [email or web address]    Consulted and Agree with Plan of Care Family member/caregiver;Patient    Family Member Consulted Daughter            Patient will benefit from skilled therapeutic intervention in order to improve the following deficits and impairments:  Abnormal gait,Decreased balance,Decreased mobility,Difficulty walking,Decreased endurance,Pain,Decreased strength,Decreased safety awareness,Decreased knowledge of use of DME,Decreased activity tolerance,Decreased range of motion,Impaired vision/preception  Visit Diagnosis: Other abnormalities of gait and mobility  Unsteadiness on feet  Muscle weakness (generalized)     Problem List Patient Active Problem List   Diagnosis Date Noted  . Eye pain 03/13/2020  . Hyperkalemia 01/14/2020  . Chronic RUQ pain 01/14/2020  . Sensation of fullness in ear, bilateral 01/11/2020  . Aphasia due to acute stroke ([place]) 08/23/2019  . Hyperlipemia 08/23/2019  . ICH (intracerebral hemorrhage) (HCC) L parietal w/ L SDH s/p crani 08/09/2019  . Subdural hematoma, acute ([place]) 08/09/2019  . Healthcare maintenance 06/20/2019  . History of strabismus surgery 03/31/2019  . Moderate, recurrent MDD 06/06/2018  . 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 Osteoarthritis of right knee 06/05/2018  . Essential hypertension 06/05/2018  . Anxiety 06/05/2018  . Age-related nuclear cataract of both eyes 11/26/2014  . Diplopia 11/26/2014  . Monocular esotropia of left eye 11/26/2014  . Corneal transplant failure 10/27/2011    Jones Bales, PT, DPT 03/24/2020, 2:15 PM  [facility] [address] [place], [place], [address] Phone: [phone]   Fax:  [phone]  Name: SHIELA BRUNS MRN: [id number] Date of Birth: March 31, 1951

## 2020-03-25 NOTE — Therapy (Signed)
[facility] [address] [place], [place], [address] Phone: [phone]   Fax:  [phone]  Speech Language Pathology Treatment  Patient Details  Name: Michelle Carrillo MRN: [id number] Date of Birth: 23-Oct-1950 Referring Provider (SLP): Frann Rider, NP   Encounter Date: 03/24/2020   End of Session - 03/25/20 1696    Visit Number 6    Number of Visits 17    Date for SLP Re-Evaluation 04/14/20    SLP Start Time 7893    SLP Stop Time  1445    SLP Time Calculation (min) 40 min    Activity Tolerance Patient tolerated treatment well           Past Medical History:  Diagnosis Date  . Anxiety   . Depression   . Herpes zoster kerstitis s/p corneal transplant 2015   . Hypertension   . Right knee osteoarthritis   . Strabismus    due to eye injury as a child  . Stroke [facility])     Past Surgical History:  Procedure Laterality Date  . CORNEAL TRANSPLANT Left    2006 and 2016  . CRANIOTOMY Left 08/09/2019   Procedure: CRANIOTOMY HEMATOMA EVACUATION SUBDURAL;  Surgeon: Ashok Pall, MD;  Location: [facility];  Service: Neurosurgery;  Laterality: Left;  left    There were no vitals filed for this visit.   Subjective Assessment - 03/25/20 0925    Subjective pt OK'd daughter interpreting.    Patient is accompained by: Family member   daughter, Michelle Carrillo   Currently in Pain? No/denies                 ADULT SLP TREATMENT - 03/25/20 0001      General Information   Behavior/Cognition Alert;Cooperative;Pleasant mood      Treatment Provided   Treatment provided Cognitive-Linquistic      Cognitive-Linquistic Treatment   Treatment focused on Cognition;Patient/family/caregiver education    Skilled Treatment SLP reviewed attention tips with pt and daughter. SLP printed list from previous session into Spanish. Daughter ?s pt's familiarity with calendars premorbidly. After SLP asked pt basic questions about  appointments on a calendar, SLP agrees possible pt decr'd familiarity wiht calendars. SLP and daughter brainstormed how to assist pt with appointmotne management without also teaching her how to read a calendar post - CVA. SLP thought of listing appointments by date instead of placing on calendar. Daughter [name]) to complete this with her mother for next appointment.      Assessment / Recommendations / Plan   Plan Continue with current plan of care      Progression Toward Goals   Progression toward goals Progressing toward goals            SLP Education - 03/25/20 0926    Education Details compensations for appointment management due to pt's likely limited pre-knowledge of calendar format, how to modify for home    Person(s) Educated Patient;Child(ren)    Methods Explanation;Demonstration;Handout;Verbal cues    Comprehension Verbalized understanding;Returned demonstration;Verbal cues required;Need further instruction            SLP Short Term Goals - 03/25/20 8101      SLP SHORT TERM GOAL #1   Title Pt wil recall and request am meds with occasional min A from caregivers over 3 sessions    Time 2    Period Weeks    Status Achieved      SLP SHORT TERM GOAL #2   Title Pt and family will  carryover 3 compensations for auditory comprehension so pt can accurately follow directions and understand daily plan    Time 1    Period Weeks    Status On-going      SLP SHORT TERM GOAL #3   Title Pt will be oriented to day and time as measured by her verbalizing times she needs to get dressed and leave for her appointments with compensatory strategies and cues from family    Baseline 03/17/20; 03/19/20    Time 1    Period Weeks    Status Achieved      SLP SHORT TERM GOAL #4   Title Complete CLQT    Status Partially Met            SLP Long Term Goals - 03/25/20 0929      SLP LONG TERM GOAL #1   Title Pt will use external aids to be oriented to date, time and daily plan with rare min  A from family    Time 5    Period Weeks    Status On-going      SLP LONG TERM GOAL #2   Title Pt will get ready for appointments and daily tasks in timely manner with rare min A from family    Time 5    Period Weeks    Status On-going      SLP LONG TERM GOAL #3   Title Pt will ID and correct language errors in converation with occasional min A from family over 2 sessions    Time Newborn - 03/25/20 0927    Clinical Impression Statement Anjalee Cope is referred for outpt ST s/p SDH/craniotomy due to ongoing cognitive communication impairments. Daughter attended therapy with pt today and pt gave auth for her to interpret. See 'skilled intervention" for moer details on today's session.  I recommend cont'd skilled ST to maximize cognitive linguistic skills for safety, QOL and to reduce caregiver burden    Speech Therapy Frequency 2x / week    Duration 8 weeks   17 visits   Treatment/Interventions SLP instruction and feedback;Compensatory strategies;Functional tasks;Cognitive reorganization;Compensatory techniques;Cueing hierarchy;Environmental controls;Patient/family education;Multimodal communcation approach;Internal/external aids    Potential to Achieve Goals Good           Patient will benefit from skilled therapeutic intervention in order to improve the following deficits and impairments:   Cognitive communication deficit    Problem List Patient Active Problem List   Diagnosis Date Noted  . Eye pain 03/13/2020  . Hyperkalemia 01/14/2020  . Chronic RUQ pain 01/14/2020  . Sensation of fullness in ear, bilateral 01/11/2020  . Aphasia due to acute stroke ([place]) 08/23/2019  . Hyperlipemia 08/23/2019  . ICH (intracerebral hemorrhage) (HCC) L parietal w/ L SDH s/p crani 08/09/2019  . Subdural hematoma, acute ([place]) 08/09/2019  . Healthcare maintenance 06/20/2019  . History of strabismus surgery 03/31/2019  . Moderate,  recurrent MDD 06/06/2018  . 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 Osteoarthritis of right knee 06/05/2018  . Essential hypertension 06/05/2018  . Anxiety 06/05/2018  . Age-related nuclear cataract of both eyes 11/26/2014  . Diplopia 11/26/2014  . Monocular esotropia of left eye 11/26/2014  . Corneal transplant failure 10/27/2011    [facility] ,MS, CCC-SLP  03/25/2020, 9:30 AM  [facility] [address] [place], [place],  [address] Phone: [phone]   Fax:  [phone]   Name: KESHANNA RISO MRN: [id number] Date of Birth: 1950-05-13

## 2020-03-26 ENCOUNTER — Ambulatory Visit: Payer: Medicare Other

## 2020-03-26 ENCOUNTER — Other Ambulatory Visit: Payer: Self-pay

## 2020-03-26 ENCOUNTER — Ambulatory Visit: Payer: Medicare Other | Admitting: Occupational Therapy

## 2020-03-26 ENCOUNTER — Encounter: Payer: Self-pay | Admitting: Occupational Therapy

## 2020-03-26 DIAGNOSIS — R41844 Frontal lobe and executive function deficit: Secondary | ICD-10-CM

## 2020-03-26 DIAGNOSIS — R2681 Unsteadiness on feet: Secondary | ICD-10-CM

## 2020-03-26 DIAGNOSIS — R2689 Other abnormalities of gait and mobility: Secondary | ICD-10-CM

## 2020-03-26 DIAGNOSIS — R41842 Visuospatial deficit: Secondary | ICD-10-CM

## 2020-03-26 DIAGNOSIS — R41841 Cognitive communication deficit: Secondary | ICD-10-CM | POA: Diagnosis not present

## 2020-03-26 DIAGNOSIS — R278 Other lack of coordination: Secondary | ICD-10-CM

## 2020-03-26 DIAGNOSIS — M6281 Muscle weakness (generalized): Secondary | ICD-10-CM | POA: Diagnosis not present

## 2020-03-26 DIAGNOSIS — I69398 Other sequelae of cerebral infarction: Secondary | ICD-10-CM | POA: Diagnosis not present

## 2020-03-26 DIAGNOSIS — R41840 Attention and concentration deficit: Secondary | ICD-10-CM

## 2020-03-26 NOTE — Therapy (Signed)
[facility] [address] [place], [place], [address] Phone: [phone]   Fax:  [phone]  Physical Therapy Treatment  Patient Details  Name: Michelle Carrillo MRN: [id number] Date of Birth: 01/06/1951 Referring Provider (PT): Referred by Joni Reining, DO. Followed by Frann Rider, NP   Encounter Date: 03/26/2020   PT End of Session - 03/26/20 1407    Visit Number 8    Number of Visits 17    Date for PT Re-Evaluation 05/18/20   POC for 8 weeks, Cert for 90 days   Authorization Type Medicare (10th Visit PN)    Progress Note Due on Visit 10    PT Start Time 1405   patient finishing up with OT   PT Stop Time 1445    PT Time Calculation (min) 40 min    Equipment Utilized During Treatment Gait belt    Activity Tolerance Patient tolerated treatment well    Behavior During Therapy WFL for tasks assessed/performed           Past Medical History:  Diagnosis Date  . Anxiety   . Depression   . Herpes zoster kerstitis s/p corneal transplant 2015   . Hypertension   . Right knee osteoarthritis   . Strabismus    due to eye injury as a child  . Stroke [facility])     Past Surgical History:  Procedure Laterality Date  . CORNEAL TRANSPLANT Left    2006 and 2016  . CRANIOTOMY Left 08/09/2019   Procedure: CRANIOTOMY HEMATOMA EVACUATION SUBDURAL;  Surgeon: Ashok Pall, MD;  Location: [facility];  Service: Neurosurgery;  Laterality: Left;  left    There were no vitals filed for this visit.   Subjective Assessment - 03/26/20 1407    Subjective No new changes/falls to report. No pain.    Patient is accompained by: Interpreter;Family member   Wilhemena Durie, [place] Contracted Therapy   Pertinent History HTN, Anxiety, Depression, OA, Strabismus    Limitations Walking    Patient Stated Goals walk better    Currently in Pain? No/denies    Pain Onset Yesterday               [facility] Adult PT Treatment/Exercise - 03/26/20 0001       Transfers   Transfers Sit to Stand;Stand to Sit    Sit to Stand 6: Modified independent (Device/Increase time)    Stand to Sit 6: Modified independent (Device/Increase time)      Ambulation/Gait   Ambulation/Gait Yes    Ambulation/Gait Assistance 5: Supervision    Ambulation/Gait Assistance Details completed ambulation througohut gym x 400 ft with visual scanning for cones placed around therapy gym. Patient missing 3 out of 10 cones with first walk through, patient require min cueing to locate cones with second walk through.    Assistive device None    Gait Pattern Within Functional Limits    Ambulation Surface Level;Indoor      Exercises   Exercises Knee/Hip      Knee/Hip Exercises: Aerobic   Other Aerobic Completed SciFit with BLE Only on Level 2.5 x 5 mintues for improved BLE strengthening and endurance training.               Balance Exercises - 03/26/20 1427      Balance Exercises: Standing   Standing Eyes Opened Narrow base of support (BOS);Foam/compliant surface;Head turns;Limitations    Standing Eyes Opened Limitations completed horizontal/vertical head turns x 10 reps with eyes open, progressed to completing  with eyes closed with wide BOS. increased challenge with vision remvoed    Standing Eyes Closed Narrow base of support (BOS);Foam/compliant surface;3 reps;30 secs    Standing Eyes Closed Limitations 3 x 30 seconds, with vision removed    Tandem Stance Eyes open;Intermittent upper extremity support;2 reps;30 secs    Tandem Stance Time 3/4th tandem position with eyes open, 2 x 30 seconds each on airex. intermittent UE and CGA    Rockerboard Anterior/posterior;Lateral;EO;EC;Intermittent UE support;Limitations    Rockerboard Limitations standing on rockerboard positioned A/P, completd standing with board steady 2 x 1 minute each with eyes open, then progressed to eyes closed 3 x 30 seconds. intermittent UE support required due to increased sway with vision removed.  Progressed to board positioned laterally, completed 3 x 1 minute with eyes open. Did not complete lateral with eyes closed due to increased challenge with eyes open.               PT Short Term Goals - 03/17/20 1411      PT SHORT TERM GOAL #1   Title patient will be independent with inital HEP for strengthening/balance (ALL STGS Due: 03/17/20)    Baseline decreased compliance, completing 2-3x/weekly    Time 4    Period Weeks    Status Partially Met    Target Date 03/17/20      PT SHORT TERM GOAL #2   Title Patient will improve Berg Balance to >/= 45/56 to demonstrate reduced fall risk    Baseline 43/56; 47/56    Time 4    Period Weeks    Status Achieved      PT SHORT TERM GOAL #3   Title Patient will demonstrate ability to ambulate x 300 ft without AD and demo proper visual scanning to allow for improved safety with household/community mobility and compensate for visual deficits    Baseline decreased scanning noted, able to ambulate x 300 ft    Time 4    Period Weeks    Status Partially Met             PT Long Term Goals - 02/18/20 1814      PT LONG TERM GOAL #1   Title Patient will be independent with final HEP for strengthening/balance (ALL LTGS DUE: 04/14/2020)    Baseline no HEP established    Time 8    Period Weeks    Status New    Target Date 04/14/20      PT LONG TERM GOAL #2   Title Patient will improve TUG to </= 12 seconds without AD to demosntrate reduced fall risk    Baseline 14.32 secs    Time 8    Period Weeks    Status New      PT LONG TERM GOAL #3   Title Patient will improve gait speed to >/= 2.7 ft/sec to demonstrate improved community mobility    Baseline 2.14 ft/sec    Time 8    Period Weeks    Status New      PT LONG TERM GOAL #4   Title Patient will improve Berg Balance Score to >/= 50/56 to demonstrate reduced risk for falls and improved balance    Baseline 43/56    Time 8    Period Weeks    Status New      PT LONG TERM GOAL #5    Title Patient will be able to complete 5x sit <> stand in </= 12 seconds without UE support to  demonstrate improved balance    Baseline 16.9 secs    Time 8    Period Weeks    Status New                 Plan - 03/26/20 1513    Clinical Impression Statement Today's skilled session focused on continued balance activities on complaint surfaces as tolerated by patient. Patients require intermittent CGA with high level balance, especially rocker board positioned lateral > A/P. Continued visual scanning activity for cones throughout session, increased verbal cues require due to decreased visual scanning. Will continue to progress toward all LTGs and continue per POC.    Personal Factors and Comorbidities Comorbidity 3+    Comorbidities HTN, Anxiety, Depression, OA, Strabismus    Examination-Activity Limitations Caring for Others;Locomotion Level;Stairs;Transfers    Examination-Participation Restrictions Cleaning;Community Activity;Medication Management    Stability/Clinical Decision Making Stable/Uncomplicated    Rehab Potential Good    PT Frequency 2x / week    PT Duration 8 weeks    PT Treatment/Interventions ADLs/Self Care Home Management;DME Instruction;Stair training;Gait training;Functional mobility training;Therapeutic activities;Balance training;Neuromuscular re-education;Therapeutic exercise;Patient/family education;Orthotic Fit/Training;Manual techniques;Vestibular;Passive range of motion    PT Next Visit Plan continued balance exercises. visual scanning. continue to progress HEP as tolerated. SciFit for Endurance. BLE strengthening. SLS activites    PT Home Exercise Plan Access Code: [email or web address]    Consulted and Agree with Plan of Care Family member/caregiver;Patient    Family Member Consulted Daughter           Patient will benefit from skilled therapeutic intervention in order to improve the following deficits and impairments:  Abnormal gait,Decreased balance,Decreased  mobility,Difficulty walking,Decreased endurance,Pain,Decreased strength,Decreased safety awareness,Decreased knowledge of use of DME,Decreased activity tolerance,Decreased range of motion,Impaired vision/preception  Visit Diagnosis: Other abnormalities of gait and mobility  Unsteadiness on feet  Muscle weakness (generalized)     Problem List Patient Active Problem List   Diagnosis Date Noted  . Eye pain 03/13/2020  . Hyperkalemia 01/14/2020  . Chronic RUQ pain 01/14/2020  . Sensation of fullness in ear, bilateral 01/11/2020  . Aphasia due to acute stroke ([place]) 08/23/2019  . Hyperlipemia 08/23/2019  . ICH (intracerebral hemorrhage) (HCC) L parietal w/ L SDH s/p crani 08/09/2019  . Subdural hematoma, acute ([place]) 08/09/2019  . Healthcare maintenance 06/20/2019  . History of strabismus surgery 03/31/2019  . Moderate, recurrent MDD 06/06/2018  . 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 Osteoarthritis of right knee 06/05/2018  . Essential hypertension 06/05/2018  . Anxiety 06/05/2018  . Age-related nuclear cataract of both eyes 11/26/2014  . Diplopia 11/26/2014  . Monocular esotropia of left eye 11/26/2014  . Corneal transplant failure 10/27/2011    Jones Bales, PT, DPT 03/26/2020, 3:15 PM  [facility] [address] [place], [place], [address] Phone: [phone]   Fax:  [phone]  Name: ALLIENE KLUGH MRN: [id number] Date of Birth: 1950-11-11

## 2020-03-26 NOTE — Therapy (Signed)
[facility] [address] [place], [place], [address] Phone: [phone]   Fax:  [phone]  Occupational Therapy Evaluation  Patient Details  Name: Michelle Carrillo MRN: [id number] Date of Birth: [birth year] Referring Provider (OT): Frann Rider, NP   Encounter Date: 03/26/2020   OT End of Session - 03/26/20 1615    Visit Number 1    Number of Visits 17    Date for OT Re-Evaluation 05/21/20    Authorization Type Medicare    Authorization - Visit Number 1    Authorization - Number of Visits 10    OT Start Time 1320    OT Stop Time 1400    OT Time Calculation (min) 40 min           Past Medical History:  Diagnosis Date  . Anxiety   . Depression   . Herpes zoster kerstitis s/p corneal transplant 2015   . Hypertension   . Right knee osteoarthritis   . Strabismus    due to eye injury as a child  . Stroke [facility])     Past Surgical History:  Procedure Laterality Date  . CORNEAL TRANSPLANT Left    2006 and 2016  . CRANIOTOMY Left 08/09/2019   Procedure: CRANIOTOMY HEMATOMA EVACUATION SUBDURAL;  Surgeon: Ashok Pall, MD;  Location: [facility];  Service: Neurosurgery;  Laterality: Left;  left    There were no vitals filed for this visit.   Subjective Assessment - 03/26/20 1619    Subjective  Denies pain    Pertinent History PMH: HTN, Anxiety, Depression, OA, Strabismus   Presented to ED on 08/09/19 with altered mental status. CT scan of the head showed a large moderate size cortically based hematoma in the left parietal region with extension into the subdural space resulting in a fair sized subdural hematoma with left-to-right midline shift. Patient recieved inpatient rehab services and was discharged home    Patient Stated Goals I want to get better and better    Currently in Pain? No/denies             [facility] OT Assessment - 03/26/20 1328      Assessment   Medical Diagnosis Nontraumatic L Cortical  Hemorrhage    Referring Provider (OT) Frann Rider, NP    Onset Date/Surgical Date 08/09/19    Hand Dominance Right    Prior Therapy Inpatient Rehab, [facility] Therapy      Precautions   Precautions Fall    Precaution Comments Interpreter Needed.       Balance Screen   Has the patient fallen in the past 6 months No    Has the patient had a decrease in activity level because of a fear of falling?  Yes    Is the patient reluctant to leave their home because of a fear of falling?  Yes      Home  Environment   Family/patient expects to be discharged to: Private residence    Type of [place] Access Stairs    Alternate Level Stairs - Number of Steps 4    Lives With Spouse   and 2 daughters     Prior Function   Level of Independence Independent    Vocation --   did not work   Leisure enjoys sewing      ADL   Eating/Feeding Modified independent    Grooming Modified independent    Upper Body Bathing Modified independent    Lower Body Bathing  Modified independent   walk in shower with seat   Upper Body Dressing Independent    Lower Body Dressing Modified independent    Toilet Transfer Modified independent    Tub/Shower Transfer Modified independent      IADL   Shopping Needs to be accompanied on any shopping trip    Light Housekeeping Performs light daily tasks such as dishwashing, bed making    Meal Prep Able to complete simple warm meal prep   pt's dtr reports she requires increased time for completing all tasks and has difficulty with sequencing.   Medication Management Takes responsibility if medication is prepared in advance in seperate dosage    Financial Management Dependent      Mobility   Mobility Status --   modified I     Written Expression   Dominant Hand Right      Vision - History   Patient Visual Report --   Blurry vision     Vision Assessment   Vision Assessment Vision impaired  _ to be further tested in functional context    Tracking/Visual Pursuits  --   decreased ease of  tracking to left   Visual Fields Right visual field deficit    Comment Pt reports blurriness of vision in right eye      Cognition   Overall Cognitive Status Impaired/Different from baseline    Area of Impairment Attention;Memory;Following commands;Awareness;Problem solving    Current Attention Level Sustained    Memory Decreased recall of precautions;Decreased short-term memory    Following Commands Follows one step commands inconsistently    Following Command Comments difficulty following directions    Awareness --   impaired awareness   Awareness Comments impaired awareness of cognitve deficits    Problem Solving Slow processing;Decreased initiation;Difficulty sequencing;Requires verbal cues    Executive Function Reasoning;Sequencing;Organizing;Decision Making      Sensation   Light Touch Not tested      Coordination   Fine Motor Movements are Fluid and Coordinated No    9 Hole Peg Test Right;Left    Right 9 Hole Peg Test 53.19   time appears to be slowed by vision as well as dexterity,   Left 9 Hole Peg Test 45.47 secs      ROM / Strength   AROM / PROM / Strength AROM;Strength      AROM   Overall AROM  Within functional limits for tasks performed    Overall AROM Comments for UE's      Strength   Overall Strength Deficits    Overall Strength Comments RUE proximal strength is grossly 4/5, distal grossly 4+/5, LUE 4+/5      Hand Function   Right Hand Grip (lbs) 42.9    Left Hand Grip (lbs) 39.9                             OT Short Term Goals - 03/26/20 1625      OT SHORT TERM GOAL #1   Title I with inital HEP.    Time 4    Period Weeks    Status New    Target Date 04/23/20      OT SHORT TERM GOAL #2   Title Pt/ caregiever will verbalize understanding of compensatory strategies for visual deficits.    Time 4    Period Weeks    Status New      OT SHORT TERM GOAL #3   Title Pt will  demonstrate ability to sequence a  simple functional task/ home management task with no more than min v.c    Time 4    Period Weeks    Status New      OT SHORT TERM GOAL #4   Title Pt will perform environmental scanning with 80% or better accuracy in a min distracting envireonment.    Time 4    Period Weeks    Status New      OT SHORT TERM GOAL #5   Title Pt will complete table top scanning tasks with 80% accuracy or better    Time 4    Period Weeks    Status New             OT Long Term Goals - 03/26/20 1628      OT LONG TERM GOAL #1   Title Pt will complete a simple cooking task including locating items in a resonable amount of time modified independently.    Time 8    Period Weeks    Status New      OT LONG TERM GOAL #2   Title Pt. will demonstrate improved bilateral UE coordination as eveidneced by decreasing 9 hole peg test score by 5 secs.    Time 8    Period Weeks    Status New      OT LONG TERM GOAL #3   Title Pt will complete tabletop scanning activities with 90% or better accuracy.    Time 8    Period Weeks    Status New      OT LONG TERM GOAL #4   Title Pt will locate items in a mod distracting environment with 90% or better accuracy.    Time 8    Period Weeks    Status New                 Plan - 03/26/20 1422    Clinical Impression Statement Pt is a 69 y.o spanish speaking female who presented to ED on 08/09/19 with altered mental status. CT scan of the head showed a large moderate size cortically based hematoma in the left parietal region with extension into the subdural space resulting in a fair sized subdural hematoma with left-to-right midline shift. Patient recieved inpatient rehab services and was discharged home.PMH: HTN, Anxiety, Depression, OA, Strabismus,corneal transplant L eye. Pt presents to occupational therapy with the following deficits: cogntive deficits, decreased balance, visual impairment, decreased coordination, decreased RUE strength which impdeds performance  of ADLS/IADLs. Pt can benefit from skilled occupational therapy to address these deficits in order to maximize pt's safety and I with daily activities. Pt lives with her husband and 2 daughters. Pt is accompanied today by her dtr who interprets for her.    OT Occupational Profile and History Detailed Assessment- Review of Records and additional review of physical, cognitive, psychosocial history related to current functional performance    Occupational performance deficits (Please refer to evaluation for details): ADL's;IADL's;Leisure;Social Participation    Body Structure / Function / Physical Skills ADL;Balance;Mobility;Strength;UE functional use;FMC;Gait;Coordination;Decreased knowledge of precautions;GMC;ROM;IADL;Dexterity;Decreased knowledge of use of DME    Cognitive Skills Attention;Memory;Problem Solve;Safety Awareness;Sequencing;Thought;Understand    Rehab Potential Good    Clinical Decision Making Several treatment options, min-mod task modification necessary    Comorbidities Affecting Occupational Performance: May have comorbidities impacting occupational performance    Modification or Assistance to Complete Evaluation  Min-Moderate modification of tasks or assist with assess necessary to complete eval    OT Frequency  2x / week    OT Duration 8 weeks   OT Treatment/Interventions Self-care/ADL training;Therapeutic exercise;Balance training;Aquatic Therapy;Ultrasound;Neuromuscular education;Manual Therapy;Splinting;Therapeutic activities;Cryotherapy;Paraffin;DME and/or AE instruction;Cognitive remediation/compensation;Visual/perceptual remediation/compensation;Fluidtherapy;Gait Training;Moist Heat;Contrast Bath;Passive range of motion;Patient/family education    Plan futher assess vision in a functional context, RUE proximal shoulder strengthening, coordination activities    Consulted and Agree with Plan of Care Patient;Family member/caregiver    Family Member Consulted daughter who interprets  for her           Patient will benefit from skilled therapeutic intervention in order to improve the following deficits and impairments:   Body Structure / Function / Physical Skills: ADL,Balance,Mobility,Strength,UE functional use,FMC,Gait,Coordination,Decreased knowledge of precautions,GMC,ROM,IADL,Dexterity,Decreased knowledge of use of DME Cognitive Skills: Attention,Memory,Problem Solve,Safety Awareness,Sequencing,Thought,Understand     Visit Diagnosis: Visuospatial deficit  Other lack of coordination  Muscle weakness (generalized)  Unsteadiness on feet  Other abnormalities of gait and mobility  Frontal lobe and executive function deficit  Attention and concentration deficit    Problem List Patient Active Problem List   Diagnosis Date Noted  . Eye pain 03/13/2020  . Hyperkalemia 01/14/2020  . Chronic RUQ pain 01/14/2020  . Sensation of fullness in ear, bilateral 01/11/2020  . Aphasia due to acute stroke ([place]) 08/23/2019  . Hyperlipemia 08/23/2019  . ICH (intracerebral hemorrhage) (HCC) L parietal w/ L SDH s/p crani 08/09/2019  . Subdural hematoma, acute ([place]) 08/09/2019  . Healthcare maintenance 06/20/2019  . History of strabismus surgery 03/31/2019  . Moderate, recurrent MDD 06/06/2018  . 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 Osteoarthritis of right knee 06/05/2018  . Essential hypertension 06/05/2018  . Anxiety 06/05/2018  . Age-related nuclear cataract of both eyes 11/26/2014  . Diplopia 11/26/2014  . Monocular esotropia of left eye 11/26/2014  . Corneal transplant failure 10/27/2011    Adilene Areola 03/26/2020, 4:42 PM Theone Murdoch, OTR/L [phone] Phone: [phone] 4:45 PM 03/26/20 [facility] [address] [place], [place], [address] Phone: [phone]   Fax:  [phone]  Name: Michelle Carrillo MRN: [id number] Date of  Birth: 03-05-1951

## 2020-03-26 NOTE — Patient Instructions (Signed)
Have Michelle Carrillo work with [name] at home exactly like we did today - amounts under one dollar.

## 2020-03-26 NOTE — Therapy (Signed)
Michelle Carrillo [address] [place], [place], [address] Phone: [phone]   Fax:  [phone]  Speech Language Pathology Treatment  Patient Details  Name: Michelle Carrillo MRN: [id number] Date of Birth: [birth year] Referring Provider (SLP): Frann Rider, NP   Encounter Date: 03/26/2020   End of Session - 03/26/20 1600    Visit Number 7    Number of Visits 17    Date for SLP Re-Evaluation 04/14/20    SLP Start Time 1450    SLP Stop Time  1530    SLP Time Calculation (min) 40 min    Activity Tolerance Patient tolerated treatment well           Past Medical History:  Diagnosis Date  . Anxiety   . Depression   . Herpes zoster kerstitis s/p corneal transplant 2015   . Hypertension   . Right knee osteoarthritis   . Strabismus    due to eye injury as a child  . Stroke [facility])     Past Surgical History:  Procedure Laterality Date  . CORNEAL TRANSPLANT Left    2006 and 2016  . CRANIOTOMY Left 08/09/2019   Procedure: CRANIOTOMY HEMATOMA EVACUATION SUBDURAL;  Surgeon: Ashok Pall, MD;  Location: [place];  Service: Neurosurgery;  Laterality: Left;  left    There were no vitals filed for this visit.   Subjective Assessment - 03/26/20 1553    Subjective pt OK'd daughter interpreting    Patient is accompained by: Family member   [name] - dtr   Currently in Pain? No/denies                 ADULT SLP TREATMENT - 03/26/20 0001      General Information   Behavior/Cognition Alert;Cooperative;Pleasant mood      Treatment Provided   Treatment provided Cognitive-Linquistic      Cognitive-Linquistic Treatment   Treatment focused on Cognition    Skilled Treatment SLP targeted attention and simple reasoning with pt generating change amounts today. Pt req'd cues at three different times pennies were not nickels. Consistent max cues for amounts under $1.00 for attention and reasoning (e.g., Michelle Carrillo has to use one  quarter to make 65 cents), Michelle Carrillo req'd usual max cues at the end of the task - it appeared she became slightly more adept at the task as it progressed. SLP ascertained pt does not work with [employer]. SLP explained to dtr how other daughter can work with pt at home with money.      Assessment / Recommendations / Plan   Plan Continue with current plan of care      Progression Toward Goals   Progression toward goals Progressing toward goals            SLP Education - 03/26/20 1559    Education Details home tasks    Person(s) Educated Patient;Child(ren)    Methods Explanation;Demonstration    Comprehension Verbalized understanding            SLP Short Term Goals - 03/26/20 1600      SLP SHORT TERM GOAL #1   Title Pt wil recall and request am meds with occasional min A from caregivers over 3 sessions    Time 2    Period Weeks    Status Achieved      SLP SHORT TERM GOAL #2   Title Pt and family will carryover 3 compensations for auditory comprehension so pt can accurately follow directions and understand daily plan  Time 1    Period Weeks    Status On-going      SLP SHORT TERM GOAL #3   Title Pt will be oriented to day and time as measured by her verbalizing times she needs to get dressed and leave for her appointments with compensatory strategies and cues from family    Baseline 03/17/20; 03/19/20    Time 1    Period Weeks    Status Achieved      SLP SHORT TERM GOAL #4   Title Complete CLQT    Status Deferred            SLP Long Term Goals - 03/26/20 1601      SLP LONG TERM GOAL #1   Title Pt will use external aids to be oriented to date, time and daily plan with rare min A from family    Time 5    Period Weeks    Status On-going      SLP LONG TERM GOAL #2   Title Pt will get ready for appointments and daily tasks in timely manner with rare min A from family    Time 5    Period Weeks    Status On-going      SLP LONG TERM GOAL #3   Title Pt will ID and  correct language errors in converation with occasional min A from family over 2 sessions    Time [place] - 03/26/20 1600    Clinical Impression Statement Michelle Carrillo is referred for outpt ST s/p SDH/craniotomy due to ongoing cognitive communication impairments. Daughter attended therapy with pt today and pt gave auth for her to interpret. Still questionable if pt has any degree of aphasia. See 'skilled intervention" for further details on today's session.  I recommend cont'd skilled ST to maximize cognitive linguistic skills for safety, QOL and to reduce caregiver burden    Speech Therapy Frequency 2x / week    Duration 8 weeks   17 visits   Treatment/Interventions SLP instruction and feedback;Compensatory strategies;Functional tasks;Cognitive reorganization;Compensatory techniques;Cueing hierarchy;Environmental controls;Patient/family education;Multimodal communcation approach;Internal/external aids    Potential to Achieve Goals Good    Potential Considerations Ability to learn/carryover information           Patient will benefit from skilled therapeutic intervention in order to improve the following deficits and impairments:   Cognitive communication deficit    Problem List Patient Active Problem List   Diagnosis Date Noted  . Eye pain 03/13/2020  . Hyperkalemia 01/14/2020  . Chronic RUQ pain 01/14/2020  . Sensation of fullness in ear, bilateral 01/11/2020  . Aphasia due to acute stroke ([place]) 08/23/2019  . Hyperlipemia 08/23/2019  . ICH (intracerebral hemorrhage) (HCC) L parietal w/ L SDH s/p crani 08/09/2019  . Subdural hematoma, acute ([place]) 08/09/2019  . Healthcare maintenance 06/20/2019  . History of strabismus surgery 03/31/2019  . Moderate, recurrent MDD 06/06/2018  . 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 Osteoarthritis of right knee 06/05/2018  . Essential hypertension  06/05/2018  . Anxiety 06/05/2018  . Age-related nuclear cataract of both eyes 11/26/2014  . Diplopia 11/26/2014  . Monocular esotropia of left eye 11/26/2014  . Corneal transplant failure 10/27/2011    [facility] ,MS, CCC-SLP  03/26/2020, 4:02 PM  [facility] [address] [place], [place], [address] Phone:  [phone]   Fax:  [phone]   Name: Michelle Carrillo MRN: [id number] Date of Birth: April 16, 1950

## 2020-03-31 ENCOUNTER — Other Ambulatory Visit: Payer: Self-pay

## 2020-03-31 ENCOUNTER — Ambulatory Visit: Payer: Medicare Other

## 2020-03-31 DIAGNOSIS — R41841 Cognitive communication deficit: Secondary | ICD-10-CM

## 2020-03-31 DIAGNOSIS — R41844 Frontal lobe and executive function deficit: Secondary | ICD-10-CM | POA: Diagnosis not present

## 2020-03-31 DIAGNOSIS — M6281 Muscle weakness (generalized): Secondary | ICD-10-CM | POA: Diagnosis not present

## 2020-03-31 DIAGNOSIS — I69398 Other sequelae of cerebral infarction: Secondary | ICD-10-CM | POA: Diagnosis not present

## 2020-03-31 DIAGNOSIS — R2681 Unsteadiness on feet: Secondary | ICD-10-CM

## 2020-03-31 DIAGNOSIS — R2689 Other abnormalities of gait and mobility: Secondary | ICD-10-CM

## 2020-03-31 DIAGNOSIS — R41842 Visuospatial deficit: Secondary | ICD-10-CM | POA: Diagnosis not present

## 2020-03-31 DIAGNOSIS — R41840 Attention and concentration deficit: Secondary | ICD-10-CM | POA: Diagnosis not present

## 2020-03-31 NOTE — Therapy (Signed)
[facility] [address] [place], [place], [address] Phone: [phone]   Fax:  [phone]  Physical Therapy Treatment  Patient Details  Name: Michelle Carrillo MRN: [id number] Date of Birth: 05-27-1950 Referring Provider (PT): Referred by Joni Reining, DO. Followed by Frann Rider, NP   Encounter Date: 03/31/2020   PT End of Session - 03/31/20 1409    Visit Number 9    Number of Visits 17    Date for PT Re-Evaluation 05/18/20   POC for 8 weeks, Cert for 90 days   Authorization Type Medicare (10th Visit PN)    Progress Note Due on Visit 10    PT Start Time 1405   patient finishing up with speech   PT Stop Time 1444    PT Time Calculation (min) 39 min    Equipment Utilized During Treatment Gait belt    Activity Tolerance Patient tolerated treatment well    Behavior During Therapy WFL for tasks assessed/performed           Past Medical History:  Diagnosis Date  . Anxiety   . Depression   . Herpes zoster kerstitis s/p corneal transplant 2015   . Hypertension   . Right knee osteoarthritis   . Strabismus    due to eye injury as a child  . Stroke [facility])     Past Surgical History:  Procedure Laterality Date  . CORNEAL TRANSPLANT Left    2006 and 2016  . CRANIOTOMY Left 08/09/2019   Procedure: CRANIOTOMY HEMATOMA EVACUATION SUBDURAL;  Surgeon: Ashok Pall, MD;  Location: [place];  Service: Neurosurgery;  Laterality: Left;  left    There were no vitals filed for this visit.   Subjective Assessment - 03/31/20 1408    Subjective No new changes/complaints. No falls to report. No pain.    Patient is accompained by: Interpreter;Family member   Wilhemena Durie, [place] Contracted Therapy   Pertinent History HTN, Anxiety, Depression, OA, Strabismus    Limitations Walking    Patient Stated Goals walk better    Currently in Pain? No/denies    Pain Onset Efrain Sella Adult PT Treatment/Exercise -  03/31/20 0001      Ambulation/Gait   Ambulation/Gait Yes    Ambulation/Gait Assistance 5: Supervision    Ambulation/Gait Assistance Details throughout therapy gym with activities    Assistive device None    Gait Pattern Within Functional Limits    Ambulation Surface Level;Indoor      High Level Balance   High Level Balance Activities Head turns    High Level Balance Comments Completed ambulation with horizontal/vertical head turns, completed x 100 ft each. increased verbal cues required for improved scanning and head turns.      Knee/Hip Exercises: Aerobic   Other Aerobic Completed SciFit with BLE only on level 3.0 x 6 minutes for improved BLE strengthening and endurance.               Balance Exercises - 03/31/20 0001      Balance Exercises: Standing   Tandem Stance Eyes open;2 reps;Limitations    Tandem Stance Time completed 3/4th tandem with eyes open and addition of horiozntal/vertical head turns x 10 reps. increased challenge with addition of horizontal/vertical head turns.    SLS with Vectors Foam/compliant surface;Limitations    SLS with Vectors Limitations standing on incline with blue mat completed forward toe taps to colored pebbles alternating  1 x 10 reps, progressed to crossover toe taps x 10 reps bilaterally increased verbal cues required for proper completion.    Balance Beam standing across blue balance beam: completed standing wide BOS and eyes closed 3 x 30 seconds. Completed horizontal/vertical head turns1 x 15 reps in bilateral directions.    Tandem Gait Forward;Intermittent upper extremity support;4 reps;Limitations    Tandem Gait Limitations completed tandem gait x 4 laps in // bars with down and back. intermittent UE support. verbal cues for narrow BOS and full tandem.    Other Standing Exercises standing on blue mat on upward incline: completed staggered stance alternating foot position, completd with EC 2 x 30 seconds bilaterally. Intermittent CGA. Completed  standing on blue mat with alternating marching with eyes open, patietn continue to require verbal cues and negotiates forward. completed 1 x 15 reps bilaterally.               PT Short Term Goals - 03/17/20 1411      PT SHORT TERM GOAL #1   Title patient will be independent with inital HEP for strengthening/balance (ALL STGS Due: 03/17/20)    Baseline decreased compliance, completing 2-3x/weekly    Time 4    Period Weeks    Status Partially Met    Target Date 03/17/20      PT SHORT TERM GOAL #2   Title Patient will improve Berg Balance to >/= 45/56 to demonstrate reduced fall risk    Baseline 43/56; 47/56    Time 4    Period Weeks    Status Achieved      PT SHORT TERM GOAL #3   Title Patient will demonstrate ability to ambulate x 300 ft without AD and demo proper visual scanning to allow for improved safety with household/community mobility and compensate for visual deficits    Baseline decreased scanning noted, able to ambulate x 300 ft    Time 4    Period Weeks    Status Partially Met             PT Long Term Goals - 02/18/20 1814      PT LONG TERM GOAL #1   Title Patient will be independent with final HEP for strengthening/balance (ALL LTGS DUE: 04/14/2020)    Baseline no HEP established    Time 8    Period Weeks    Status New    Target Date 04/14/20      PT LONG TERM GOAL #2   Title Patient will improve TUG to </= 12 seconds without AD to demosntrate reduced fall risk    Baseline 14.32 secs    Time 8    Period Weeks    Status New      PT LONG TERM GOAL #3   Title Patient will improve gait speed to >/= 2.7 ft/sec to demonstrate improved community mobility    Baseline 2.14 ft/sec    Time 8    Period Weeks    Status New      PT LONG TERM GOAL #4   Title Patient will improve Berg Balance Score to >/= 50/56 to demonstrate reduced risk for falls and improved balance    Baseline 43/56    Time 8    Period Weeks    Status New      PT LONG TERM GOAL #5    Title Patient will be able to complete 5x sit <> stand in </= 12 seconds without UE support to demonstrate improved balance  Baseline 16.9 secs    Time 8    Period Weeks    Status New                 Plan - 03/31/20 1449    Clinical Impression Statement Continued today'ss ession focused on continued balance activites continue to work on narrow BOS, vision removed, and complaint surfaces. Patient demo increased challenge with addition of head turns to tandem stance activites. Continued endurance training as well with patient tolerating well. will continue to progress toward all goals.    Personal Factors and Comorbidities Comorbidity 3+    Comorbidities HTN, Anxiety, Depression, OA, Strabismus    Examination-Activity Limitations Caring for Others;Locomotion Level;Stairs;Transfers    Examination-Participation Restrictions Cleaning;Community Activity;Medication Management    Stability/Clinical Decision Making Stable/Uncomplicated    Rehab Potential Good    PT Frequency 2x / week    PT Duration 8 weeks    PT Treatment/Interventions ADLs/Self Care Home Management;DME Instruction;Stair training;Gait training;Functional mobility training;Therapeutic activities;Balance training;Neuromuscular re-education;Therapeutic exercise;Patient/family education;Orthotic Fit/Training;Manual techniques;Vestibular;Passive range of motion    PT Next Visit Plan continued balance exercises. visual scanning. continue to progress HEP as tolerated. SciFit for Endurance. BLE strengthening. SLS activites    PT Home Exercise Plan Access Code: [email or web address]    Consulted and Agree with Plan of Care Family member/caregiver;Patient    Family Member Consulted Daughter           Patient will benefit from skilled therapeutic intervention in order to improve the following deficits and impairments:  Abnormal gait,Decreased balance,Decreased mobility,Difficulty walking,Decreased endurance,Pain,Decreased strength,Decreased  safety awareness,Decreased knowledge of use of DME,Decreased activity tolerance,Decreased range of motion,Impaired vision/preception  Visit Diagnosis: Muscle weakness (generalized)  Unsteadiness on feet  Other abnormalities of gait and mobility     Problem List Patient Active Problem List   Diagnosis Date Noted  . Eye pain 03/13/2020  . Hyperkalemia 01/14/2020  . Chronic RUQ pain 01/14/2020  . Sensation of fullness in ear, bilateral 01/11/2020  . Aphasia due to acute stroke ([place]) 08/23/2019  . Hyperlipemia 08/23/2019  . ICH (intracerebral hemorrhage) (HCC) L parietal w/ L SDH s/p crani 08/09/2019  . Subdural hematoma, acute ([place]) 08/09/2019  . Healthcare maintenance 06/20/2019  . History of strabismus surgery 03/31/2019  . Moderate, recurrent MDD 06/06/2018  . 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 Osteoarthritis of right knee 06/05/2018  . Essential hypertension 06/05/2018  . Anxiety 06/05/2018  . Age-related nuclear cataract of both eyes 11/26/2014  . Diplopia 11/26/2014  . Monocular esotropia of left eye 11/26/2014  . Corneal transplant failure 10/27/2011    Jones Bales, PT, DPT 03/31/2020, 2:54 PM  [facility] [address] [place], [place], [address] Phone: [phone]   Fax:  [phone]  Name: KATELAND LEISINGER MRN: [id number] Date of Birth: 05/16/1950

## 2020-04-01 NOTE — Therapy (Signed)
[facility] [address] [place], [place], [address] Phone: [phone]   Fax:  [phone]  Speech Language Pathology Treatment  Patient Details  Name: Michelle Carrillo MRN: [id number] Date of Birth: [birth year] Referring Provider (SLP): Frann Rider, NP   Encounter Date: 03/31/2020   End of Session - 04/01/20 0845    Visit Number 8    Number of Visits 17    Date for SLP Re-Evaluation 04/14/20    SLP Start Time 81    SLP Stop Time  1400    SLP Time Calculation (min) 42 min    Activity Tolerance Patient tolerated treatment well           Past Medical History:  Diagnosis Date  . Anxiety   . Depression   . Herpes zoster kerstitis s/p corneal transplant 2015   . Hypertension   . Right knee osteoarthritis   . Strabismus    due to eye injury as a child  . Stroke [facility])     Past Surgical History:  Procedure Laterality Date  . CORNEAL TRANSPLANT Left    2006 and 2016  . CRANIOTOMY Left 08/09/2019   Procedure: CRANIOTOMY HEMATOMA EVACUATION SUBDURAL;  Surgeon: Ashok Pall, MD;  Location: [place];  Service: Neurosurgery;  Laterality: Left;  left    There were no vitals filed for this visit.   Subjective Assessment - 03/31/20 1325    Subjective pt OK'd daughter interpreting    Patient is accompained by: Family member   [place]                ADULT SLP TREATMENT - 04/01/20 0001      General Information   Behavior/Cognition Alert;Cooperative;Pleasant mood      Treatment Provided   Treatment provided Cognitive-Linquistic      Cognitive-Linquistic Treatment   Treatment focused on Cognition    Skilled Treatment To target pt's attention and organization, SLP asked pt about certain dishes she cooks as well as some divergent and convergent naming- daughger agreed with SLP that pt's organization and attention wseemed less focused as she repeated ingredients she already told SLP and had conversation  between telling SLP items in simple categories. SLP educated how to make change task more difficult at home should pt master the coin combination homework (makingn change for an item under $1).      Assessment / Recommendations / Plan   Plan Continue with current plan of care      Progression Toward Goals   Progression toward goals Progressing toward goals            SLP Education - 04/01/20 0844    Education Details home tasks    Person(s) Educated Patient;Child(ren)    Methods Explanation    Comprehension Verbalized understanding            SLP Short Term Goals - 04/01/20 0845      SLP SHORT TERM GOAL #1   Title Pt wil recall and request am meds with occasional min A from caregivers over 3 sessions    Time 1    Period Weeks    Status Achieved      SLP SHORT TERM GOAL #2   Title Pt and family will carryover 3 compensations for auditory comprehension so pt can accurately follow directions and understand daily plan    Time 1    Period Weeks    Status On-going      SLP SHORT TERM GOAL #3  Title Pt will be oriented to day and time as measured by her verbalizing times she needs to get dressed and leave for her appointments with compensatory strategies and cues from family    Baseline 03/17/20; 03/19/20    Status Achieved      SLP SHORT TERM GOAL #4   Title Complete CLQT    Status Deferred            SLP Long Term Goals - 04/01/20 0846      SLP LONG TERM GOAL #1   Title Pt will use external aids to be oriented to date, time and daily plan with rare min A from family    Time 4    Period Weeks    Status On-going      SLP LONG TERM GOAL #2   Title Pt will get ready for appointments and daily tasks in timely manner with rare min A from family    Time 4    Period Weeks    Status On-going      SLP LONG TERM GOAL #3   Title Pt will ID and correct language errors in converation with occasional min A from family over 2 sessions    Time 4    Period Weeks    Status  On-going            Plan - 04/01/20 0845    Clinical Impression Statement Brenlynn Fake is referred for outpt ST s/p SDH/craniotomy due to ongoing cognitive communication impairments. Daughter attended therapy with pt today and pt gave auth for her to interpret. Still questionable if pt has any degree of aphasia. See 'skilled intervention" for further details on today's session.  I recommend cont'd skilled ST to maximize cognitive linguistic skills for safety, QOL and to reduce caregiver burden    Speech Therapy Frequency 2x / week    Duration 8 weeks   17 visits   Treatment/Interventions SLP instruction and feedback;Compensatory strategies;Functional tasks;Cognitive reorganization;Compensatory techniques;Cueing hierarchy;Environmental controls;Patient/family education;Multimodal communcation approach;Internal/external aids    Potential to Achieve Goals Good    Potential Considerations Ability to learn/carryover information           Patient will benefit from skilled therapeutic intervention in order to improve the following deficits and impairments:   Cognitive communication deficit    Problem List Patient Active Problem List   Diagnosis Date Noted  . Eye pain 03/13/2020  . Hyperkalemia 01/14/2020  . Chronic RUQ pain 01/14/2020  . Sensation of fullness in ear, bilateral 01/11/2020  . Aphasia due to acute stroke ([place]) 08/23/2019  . Hyperlipemia 08/23/2019  . ICH (intracerebral hemorrhage) (HCC) L parietal w/ L SDH s/p crani 08/09/2019  . Subdural hematoma, acute ([place]) 08/09/2019  . Healthcare maintenance 06/20/2019  . History of strabismus surgery 03/31/2019  . Moderate, recurrent MDD 06/06/2018  . 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 Osteoarthritis of right knee 06/05/2018  . Essential hypertension 06/05/2018  . Anxiety 06/05/2018  . Age-related nuclear cataract of both eyes 11/26/2014  . Diplopia 11/26/2014  . Monocular  esotropia of left eye 11/26/2014  . Corneal transplant failure 10/27/2011    [facility] ,MS, CCC-SLP  04/01/2020, 8:47 AM  [facility] [address] [place], [place], [address] Phone: [phone]   Fax:  [phone]   Name: DENNISSE SWADER MRN: [id number] Date of Birth: [birth year]

## 2020-04-02 ENCOUNTER — Encounter: Payer: Self-pay | Admitting: Occupational Therapy

## 2020-04-02 ENCOUNTER — Other Ambulatory Visit: Payer: Self-pay

## 2020-04-02 ENCOUNTER — Ambulatory Visit: Payer: Medicare Other

## 2020-04-02 ENCOUNTER — Ambulatory Visit: Payer: Medicare Other | Admitting: Occupational Therapy

## 2020-04-02 DIAGNOSIS — M6281 Muscle weakness (generalized): Secondary | ICD-10-CM | POA: Diagnosis not present

## 2020-04-02 DIAGNOSIS — R2689 Other abnormalities of gait and mobility: Secondary | ICD-10-CM

## 2020-04-02 DIAGNOSIS — R2681 Unsteadiness on feet: Secondary | ICD-10-CM

## 2020-04-02 DIAGNOSIS — R41840 Attention and concentration deficit: Secondary | ICD-10-CM | POA: Diagnosis not present

## 2020-04-02 DIAGNOSIS — R41844 Frontal lobe and executive function deficit: Secondary | ICD-10-CM

## 2020-04-02 DIAGNOSIS — R278 Other lack of coordination: Secondary | ICD-10-CM

## 2020-04-02 DIAGNOSIS — I69398 Other sequelae of cerebral infarction: Secondary | ICD-10-CM | POA: Diagnosis not present

## 2020-04-02 DIAGNOSIS — R41841 Cognitive communication deficit: Secondary | ICD-10-CM | POA: Diagnosis not present

## 2020-04-02 DIAGNOSIS — H539 Unspecified visual disturbance: Secondary | ICD-10-CM

## 2020-04-02 DIAGNOSIS — R41842 Visuospatial deficit: Secondary | ICD-10-CM | POA: Diagnosis not present

## 2020-04-02 NOTE — Therapy (Signed)
[facility] [address] [place], [place], [address] Phone: [phone]   Fax:  [phone]  Speech Language Pathology Treatment  Patient Details  Name: Michelle Carrillo MRN: [id number] Date of Birth: 1950-06-22 Referring Provider (SLP): Frann Rider, NP   Encounter Date: 04/02/2020   End of Session - 04/02/20 1639    Visit Number 9    Number of Visits 17    Date for SLP Re-Evaluation 04/14/20    SLP Start Time 1103    SLP Stop Time  1145    SLP Time Calculation (min) 42 min    Activity Tolerance Patient tolerated treatment well           Past Medical History:  Diagnosis Date  . Anxiety   . Depression   . Herpes zoster kerstitis s/p corneal transplant 2015   . Hypertension   . Right knee osteoarthritis   . Strabismus    due to eye injury as a child  . Stroke [facility])     Past Surgical History:  Procedure Laterality Date  . CORNEAL TRANSPLANT Left    2006 and 2016  . CRANIOTOMY Left 08/09/2019   Procedure: CRANIOTOMY HEMATOMA EVACUATION SUBDURAL;  Surgeon: Ashok Pall, MD;  Location: [facility];  Service: Neurosurgery;  Laterality: Left;  left    There were no vitals filed for this visit.   Subjective Assessment - 04/02/20 1111    Subjective Pt arrives alone    Patient is accompained by: Interpreter    Currently in Pain? No/denies                 ADULT SLP TREATMENT - 04/02/20 1112      General Information   Behavior/Cognition Alert;Cooperative;Pleasant mood      Treatment Provided   Treatment provided Cognitive-Linquistic      Cognitive-Linquistic Treatment   Treatment focused on Cognition    Skilled Treatment Pt with explanation of her children and grandchildren - pt demonstrated WNL attention during this 5-6 minute conversation. SLP asked pt about her Zoom meetings for church - is she still having difficulty with mute/unmute - pt answered question thikning SLP was asing about  taking notes - now she does this wihtout difficulty, reportedly. [name] tells SLP that she is not talking during the meetings as much now because she is not as confident in what she would say. Pt tells SLP that she has been working with change at home with [name]. SLP had pt separate cards into two piles (in a frig and not in a frig) to target attention/simple reasoning. With this task pt demonstrated reduced attention as she put items into the incorrect pile 5/13 times. When SLP repeated the question and provided pt extra time pt self corrected 3/5 times. Mod-max cues necessary for last two items.      Assessment / Recommendations / Plan   Plan Continue with current plan of care      Progression Toward Goals   Progression toward goals Progressing toward goals              SLP Short Term Goals - 04/02/20 1640      SLP SHORT TERM GOAL #1   Title Pt wil recall and request am meds with occasional min A from caregivers over 3 sessions    Status Achieved      SLP SHORT TERM GOAL #2   Title Pt and family will carryover 3 compensations for auditory comprehension so pt can accurately follow directions  and understand daily plan    Status Partially Met      SLP SHORT TERM GOAL #3   Title Pt will be oriented to day and time as measured by her verbalizing times she needs to get dressed and leave for her appointments with compensatory strategies and cues from family    Baseline 03/17/20; 03/19/20    Status Achieved      SLP SHORT TERM GOAL #4   Title Complete CLQT    Status Deferred            SLP Long Term Goals - 04/02/20 1640      SLP LONG TERM GOAL #1   Title Pt will use external aids to be oriented to date, time and daily plan with rare min A from family    Time 4    Period Weeks    Status On-going      SLP LONG TERM GOAL #2   Title Pt will get ready for appointments and daily tasks in timely manner with rare min A from family    Time 4    Period Weeks    Status On-going       SLP LONG TERM GOAL #3   Title Pt will ID and correct language errors in converation with occasional min A from family over 2 sessions    Time 4    Period Weeks    Status On-going            Plan - 04/02/20 1639    Clinical Impression Statement Michelle Carrillo is referred for outpt ST s/p SDH/craniotomy due to ongoing cognitive communication impairments. Pt arrived alone today to tx. Still questionable if pt has any degree of aphasia. See 'skilled intervention" for further details on today's session.  I recommend cont'd skilled ST to maximize cognitive linguistic skills for safety, QOL and to reduce caregiver burden    Speech Therapy Frequency 2x / week    Duration 8 weeks   17 visits   Treatment/Interventions SLP instruction and feedback;Compensatory strategies;Functional tasks;Cognitive reorganization;Compensatory techniques;Cueing hierarchy;Environmental controls;Patient/family education;Multimodal communcation approach;Internal/external aids    Potential to Achieve Goals Good    Potential Considerations Ability to learn/carryover information           Patient will benefit from skilled therapeutic intervention in order to improve the following deficits and impairments:   Cognitive communication deficit    Problem List Patient Active Problem List   Diagnosis Date Noted  . Eye pain 03/13/2020  . Hyperkalemia 01/14/2020  . Chronic RUQ pain 01/14/2020  . Sensation of fullness in ear, bilateral 01/11/2020  . Aphasia due to acute stroke ([place]) 08/23/2019  . Hyperlipemia 08/23/2019  . ICH (intracerebral hemorrhage) (HCC) L parietal w/ L SDH s/p crani 08/09/2019  . Subdural hematoma, acute ([place]) 08/09/2019  . Healthcare maintenance 06/20/2019  . History of strabismus surgery 03/31/2019  . Moderate, recurrent MDD 06/06/2018  . 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 Osteoarthritis of right knee 06/05/2018  . Essential hypertension 06/05/2018   . Anxiety 06/05/2018  . Age-related nuclear cataract of both eyes 11/26/2014  . Diplopia 11/26/2014  . Monocular esotropia of left eye 11/26/2014  . Corneal transplant failure 10/27/2011    Jourdain Guay [facility] ,[place], CCC-SLP  04/02/2020, 4:41 PM  [facility] [address] [place], [place], [address] Phone: [phone]   Fax:  [phone]   Name: Michelle Carrillo MRN: [id number] Date of Birth: Jul 14, 1950

## 2020-04-02 NOTE — Therapy (Signed)
[facility] [address] [place], [place], [address] Phone: [phone]   Fax:  [phone]  Occupational Therapy Treatment  Patient Details  Name: Michelle Carrillo MRN: [id number] Date of Birth: 1950/07/27 Referring Provider (OT): Frann Rider, NP   Encounter Date: 04/02/2020   OT End of Session - 04/02/20 1511    Visit Number 2    Number of Visits 17    Date for OT Re-Evaluation 05/21/20    Authorization Type Medicare    Authorization - Visit Number 2    Authorization - Number of Visits 10    OT Start Time [id number]    OT Stop Time 1315    OT Time Calculation (min) 45 min    Activity Tolerance Patient tolerated treatment well    Behavior During Therapy [facility] for tasks assessed/performed           Past Medical History:  Diagnosis Date  . Anxiety   . Depression   . Herpes zoster kerstitis s/p corneal transplant 2015   . Hypertension   . Right knee osteoarthritis   . Strabismus    due to eye injury as a child  . Stroke [facility])     Past Surgical History:  Procedure Laterality Date  . CORNEAL TRANSPLANT Left    2006 and 2016  . CRANIOTOMY Left 08/09/2019   Procedure: CRANIOTOMY HEMATOMA EVACUATION SUBDURAL;  Surgeon: Ashok Pall, MD;  Location: [facility];  Service: Neurosurgery;  Laterality: Left;  left    There were no vitals filed for this visit.   Subjective Assessment - 04/02/20 1236    Subjective  Reports pain in right shoulder 5/10 with all efforts to move    Patient is accompanied by: Interpreter   [employer]   Pertinent History PMH: HTN, Anxiety, Depression, OA, Strabismus   Presented to ED on 08/09/19 with altered mental status. CT scan of the head showed a large moderate size cortically based hematoma in the left parietal region with extension into the subdural space resulting in a fair sized subdural hematoma with left-to-right midline shift. Patient recieved inpatient rehab services and was  discharged home    Patient Stated Goals I want to get better and better    Currently in Pain? Yes    Pain Score 5     Pain Location Shoulder    Pain Orientation Right    Pain Descriptors / Indicators Aching                        OT Treatments/Exercises (OP) - 04/02/20 1502      ADLs   Cooking Patient did indicate that she was cooking on stovetop at home without supervision.  Encouraged patient to not cook without assistance until more resolution of concentration and visual skills.  Patient in agreement - no family member present today.      Visual/Perceptual Exercises   Other Exercises Patient reports difficulty seeing this in left peripheral fields and describes over looking to find objects.  She does report bumping into obstacles on left, and having difficulty finding things in the refrigerator.  Patient with difficulty following instructions to move eyes separate from head especially to left.  Little volitional eye movement to left of midline noted this session.  Patient also having trouble processing verbal instructions - due to language and/or cognition.      Neurological Re-education Exercises   Other Exercises 1 Neuromuscular reeducation to ascertain cause for Right shoulder pain.  Patient with repeated fast, strong motions in right arm which produce pain.  With attempts to passively move patient's arm - no pain reported.  Patient had difficulty allowing passive movement.  Patient with increased tension throughout right shoulder, and proximal lateral humerus.  With all attempts for active range of motion patient with excessive co-activation and pain.  Encouraged relaxed, gentle motion in pain free ranges until next visit.      Manual Therapy   Manual Therapy Soft tissue mobilization    Soft tissue mobilization Patient with knot at lateral proximal humerus (deltoid insertion region)  Provided soft tissue mobilization and followed with heat for relief of symptoms.                   OT Education - 04/02/20 1511    Education Details do not lift with right arm, only gentle movement for now to reduce pain    Person(s) Educated Patient;Other (comment)   interpreter   Methods Explanation;Demonstration    Comprehension Need further instruction            OT Short Term Goals - 04/02/20 1239      OT SHORT TERM GOAL #1   Title I with inital HEP.    Time 4    Status On-going    Target Date 04/23/20      OT SHORT TERM GOAL #2   Title Pt/ caregiever will verbalize understanding of compensatory strategies for visual deficits.    Time 4    Period Weeks    Status On-going      OT SHORT TERM GOAL #3   Title Pt will demonstrate ability to sequence a simple functional task/ home management task with no more than min v.c    Time 4    Period Weeks    Status On-going      OT SHORT TERM GOAL #4   Title Pt will perform environmental scanning with 80% or better accuracy in a min distracting envireonment.    Time 4    Period Weeks    Status On-going      OT SHORT TERM GOAL #5   Title Pt will complete table top scanning tasks with 80% accuracy or better    Time 4    Period Weeks    Status On-going             OT Long Term Goals - 04/02/20 1241      OT LONG TERM GOAL #1   Title Pt will complete a simple cooking task including locating items in a resonable amount of time modified independently.    Time 8    Status On-going      OT LONG TERM GOAL #2   Title Pt. will demonstrate improved bilateral UE coordination as evidenced by decreasing 9 hole peg test score by 5 secs.    Time 8    Period Weeks    Status On-going      OT LONG TERM GOAL #3   Title Pt will complete tabletop scanning activities with 90% or better accuracy.    Time 8    Period Weeks    Status On-going      OT LONG TERM GOAL #4   Title Pt will locate items in a mod distracting environment with 90% or better accuracy.    Time 8    Period Weeks    Status On-going  Plan - 04/02/20 1512    Clinical Impression Statement Patient reports today with right shoulder pain over past 5 days.  Patient with overactivity in right shoulder girdle, and fast moving actions leading to pain.    OT Occupational Profile and History Detailed Assessment- Review of Records and additional review of physical, cognitive, psychosocial history related to current functional performance    Occupational performance deficits (Please refer to evaluation for details): ADL's;IADL's;Leisure;Social Participation    Body Structure / Function / Physical Skills ADL;Balance;Mobility;Strength;UE functional use;FMC;Gait;Coordination;Decreased knowledge of precautions;GMC;ROM;IADL;Dexterity;Decreased knowledge of use of DME    Cognitive Skills Attention;Memory;Problem Solve;Safety Awareness;Sequencing;Thought;Understand    Rehab Potential Good    Clinical Decision Making Several treatment options, min-mod task modification necessary    Comorbidities Affecting Occupational Performance: May have comorbidities impacting occupational performance    Modification or Assistance to Complete Evaluation  Min-Moderate modification of tasks or assist with assess necessary to complete eval    OT Frequency 2x / week    OT Duration 8 weeks    OT Treatment/Interventions Self-care/ADL training;Therapeutic exercise;Balance training;Aquatic Therapy;Ultrasound;Neuromuscular education;Manual Therapy;Splinting;Therapeutic activities;Cryotherapy;Paraffin;DME and/or AE instruction;Cognitive remediation/compensation;Visual/perceptual remediation/compensation;Fluidtherapy;Gait Training;Moist Heat;Contrast Bath;Passive range of motion;Patient/family education    Plan futher assess vision in a functional context, RUE proximal shoulder strengthening, coordination activities    Consulted and Agree with Plan of Care Patient;Family member/caregiver    Family Member Consulted daughter who interprets for her            Patient will benefit from skilled therapeutic intervention in order to improve the following deficits and impairments:   Body Structure / Function / Physical Skills: ADL,Balance,Mobility,Strength,UE functional use,FMC,Gait,Coordination,Decreased knowledge of precautions,GMC,ROM,IADL,Dexterity,Decreased knowledge of use of DME Cognitive Skills: Attention,Memory,Problem Solve,Safety Awareness,Sequencing,Thought,Understand     Visit Diagnosis: Visuospatial deficit  Frontal lobe and executive function deficit  Attention and concentration deficit  Visual disturbance as complication of stroke  Other lack of coordination  Unsteadiness on feet  Muscle weakness (generalized)    Problem List Patient Active Problem List   Diagnosis Date Noted  . Eye pain 03/13/2020  . Hyperkalemia 01/14/2020  . Chronic RUQ pain 01/14/2020  . Sensation of fullness in ear, bilateral 01/11/2020  . Aphasia due to acute stroke ([place]) 08/23/2019  . Hyperlipemia 08/23/2019  . ICH (intracerebral hemorrhage) (HCC) L parietal w/ L SDH s/p crani 08/09/2019  . Subdural hematoma, acute ([place]) 08/09/2019  . Healthcare maintenance 06/20/2019  . History of strabismus surgery 03/31/2019  . Moderate, recurrent MDD 06/06/2018  . 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 Osteoarthritis of right knee 06/05/2018  . Essential hypertension 06/05/2018  . Anxiety 06/05/2018  . Age-related nuclear cataract of both eyes 11/26/2014  . Diplopia 11/26/2014  . Monocular esotropia of left eye 11/26/2014  . Corneal transplant failure 10/27/2011    Mariah Milling, OTR/L 04/02/2020, 3:25 PM  [facility] [address] [place], [place], [address] Phone: [phone]   Fax:  [phone]  Name: Michelle Carrillo MRN: [id number] Date of Birth: May 03, 1950

## 2020-04-02 NOTE — Therapy (Signed)
[facility] [address] [place], [place], [address] Phone: [phone]   Fax:  [phone]  Physical Therapy Treatment/10th Visit PN  Patient Details  Name: Michelle Carrillo MRN: [id number] Date of Birth: 11-15-1950 Referring Provider (PT): Referred by Joni Reining, DO. Followed by Frann Rider, NP  Physical Therapy Progress Note   Dates of Reporting Period: 02/18/20 - 04/02/20  See Note below for Objective Data and Assessment of Progress/Goals.  Thank you for the referral of this patient. Guillermina [place], PT, DPT   Encounter Date: 04/02/2020   PT End of Session - 04/02/20 1020    Visit Number 10    Number of Visits 17    Date for PT Re-Evaluation 05/18/20   POC for 8 weeks, Cert for 90 days   Authorization Type Medicare (10th Visit PN)    Progress Note Due on Visit 10    PT Start Time 1019    PT Stop Time 1100    PT Time Calculation (min) 41 min    Equipment Utilized During Treatment Gait belt    Activity Tolerance Patient tolerated treatment well    Behavior During Therapy WFL for tasks assessed/performed           Past Medical History:  Diagnosis Date  . Anxiety   . Depression   . Herpes zoster kerstitis s/p corneal transplant 2015   . Hypertension   . Right knee osteoarthritis   . Strabismus    due to eye injury as a child  . Stroke [facility])     Past Surgical History:  Procedure Laterality Date  . CORNEAL TRANSPLANT Left    2006 and 2016  . CRANIOTOMY Left 08/09/2019   Procedure: CRANIOTOMY HEMATOMA EVACUATION SUBDURAL;  Surgeon: Ashok Pall, MD;  Location: [facility];  Service: Neurosurgery;  Laterality: Left;  left    There were no vitals filed for this visit.   Subjective Assessment - 04/02/20 1022    Subjective Patient reports no new changes/complaints since last visit. No falls or pain.    Patient is accompained by: Interpreter;Family member   Wilhemena Durie, [place] Contracted Therapy   Pertinent  History HTN, Anxiety, Depression, OA, Strabismus    Limitations Walking    Patient Stated Goals walk better    Currently in Pain? No/denies    Pain Onset Yesterday              [facility] Adult PT Treatment/Exercise - 04/02/20 0001      Transfers   Transfers Sit to Stand;Stand to Sit    Sit to Stand 6: Modified independent (Device/Increase time)    Five time sit to stand comments  11.2 secs without UE from mat    Stand to Sit 6: Modified independent (Device/Increase time)    Comments completed sit <> stand 2 x 10 reps with BLE placed on airex. close supervision, no imbalance noted.      Ambulation/Gait   Ambulation/Gait Yes    Ambulation/Gait Assistance 5: Supervision    Ambulation/Gait Assistance Details completed ambulation throughout therapy gym    Ambulation Distance (Feet) --   clinic distances   Assistive device None    Gait Pattern Within Functional Limits    Ambulation Surface Level;Indoor    Gait velocity 9.63 secs = 3.4 ft/sec      Standardized Balance Assessment   Standardized Balance Assessment Timed Up and Go Test      Timed Up and Go Test   TUG Normal TUG  Normal TUG (seconds) 11.37      Exercises   Exercises Knee/Hip      Knee/Hip Exercises: Aerobic   Other Aerobic Completed SciFit with BLE only Level 2.5 x 5 mintues for improved BLE strengthening and endurance training.               Balance Exercises - 04/02/20 0001      Balance Exercises: Standing   Tandem Stance Eyes open;Foam/compliant surface;3 reps;20 secs    Tandem Stance Time completed standing with tandem on blue balance beam, completed 3 x 30 seconds. intemrittent UE support and CGA from PT.    Rockerboard Anterior/posterior;Lateral;Head turns;EO;EC;Limitations    Rockerboard Limitations standing on rockerboard positioned A/P, completed eyes closed 3 x 30 seconds, horizontal and vertical head turns 1 x 10 reps each. intermittent UE support required, CGA. With board positioned lateral  completed eyes closed 2 x 30 second, horizontal/vertical head turns 1 x 10 reps each. increased challenge with board positioned laterally.               PT Short Term Goals - 03/17/20 1411      PT SHORT TERM GOAL #1   Title patient will be independent with inital HEP for strengthening/balance (ALL STGS Due: 03/17/20)    Baseline decreased compliance, completing 2-3x/weekly    Time 4    Period Weeks    Status Partially Met    Target Date 03/17/20      PT SHORT TERM GOAL #2   Title Patient will improve Berg Balance to >/= 45/56 to demonstrate reduced fall risk    Baseline 43/56; 47/56    Time 4    Period Weeks    Status Achieved      PT SHORT TERM GOAL #3   Title Patient will demonstrate ability to ambulate x 300 ft without AD and demo proper visual scanning to allow for improved safety with household/community mobility and compensate for visual deficits    Baseline decreased scanning noted, able to ambulate x 300 ft    Time 4    Period Weeks    Status Partially Met             PT Long Term Goals - 04/02/20 1153      PT LONG TERM GOAL #1   Title Patient will be independent with final HEP for strengthening/balance (ALL LTGS DUE: 04/14/2020)    Baseline no HEP established    Time 8    Period Weeks    Status New      PT LONG TERM GOAL #2   Title Patient will improve TUG to </= 12 seconds without AD to demosntrate reduced fall risk    Baseline 14.32 secs; 11.37 secs    Time 8    Period Weeks    Status Achieved      PT LONG TERM GOAL #3   Title Patient will improve gait speed to >/= 2.7 ft/sec to demonstrate improved community mobility    Baseline 2.14 ft/sec; 3.4 ft/sec    Time 8    Period Weeks    Status Achieved      PT LONG TERM GOAL #4   Title Patient will improve Berg Balance Score to >/= 50/56 to demonstrate reduced risk for falls and improved balance    Baseline 43/56    Time 8    Period Weeks    Status New      PT LONG TERM GOAL #5   Title Patient  will be  able to complete 5x sit <> stand in </= 12 seconds without UE support to demonstrate improved balance    Baseline 16.9 secs; 11.2 secs    Time 8    Period Weeks    Status Achieved                 Plan - 04/02/20 1056    Clinical Impression Statement Completed assesment of progress toward LTG for 10th Visit PN. Patient improved 5 sit <> stand to 11.2 seconds, and TUG to 11.37 secs demonstrating reduced fall risk. Patient is making significant progress with PT services. Rest of session focused on balance exercises and continued endurance/BLE strengthening. Will continue to progress toward all LTGs.    Personal Factors and Comorbidities Comorbidity 3+    Comorbidities HTN, Anxiety, Depression, OA, Strabismus    Examination-Activity Limitations Caring for Others;Locomotion Level;Stairs;Transfers    Examination-Participation Restrictions Cleaning;Community Activity;Medication Management    Stability/Clinical Decision Making Stable/Uncomplicated    Rehab Potential Good    PT Frequency 2x / week    PT Duration 8 weeks    PT Treatment/Interventions ADLs/Self Care Home Management;DME Instruction;Stair training;Gait training;Functional mobility training;Therapeutic activities;Balance training;Neuromuscular re-education;Therapeutic exercise;Patient/family education;Orthotic Fit/Training;Manual techniques;Vestibular;Passive range of motion    PT Next Visit Plan continued balance exercises. visual scanning. continue to progress HEP as tolerated. SciFit for Endurance. BLE strengthening. SLS activites    PT Home Exercise Plan Access Code: [email or web address]    Consulted and Agree with Plan of Care Family member/caregiver;Patient    Family Member Consulted Daughter           Patient will benefit from skilled therapeutic intervention in order to improve the following deficits and impairments:  Abnormal gait,Decreased balance,Decreased mobility,Difficulty walking,Decreased endurance,Pain,Decreased  strength,Decreased safety awareness,Decreased knowledge of use of DME,Decreased activity tolerance,Decreased range of motion,Impaired vision/preception  Visit Diagnosis: Muscle weakness (generalized)  Unsteadiness on feet  Other abnormalities of gait and mobility     Problem List Patient Active Problem List   Diagnosis Date Noted  . Eye pain 03/13/2020  . Hyperkalemia 01/14/2020  . Chronic RUQ pain 01/14/2020  . Sensation of fullness in ear, bilateral 01/11/2020  . Aphasia due to acute stroke ([place]) 08/23/2019  . Hyperlipemia 08/23/2019  . ICH (intracerebral hemorrhage) (HCC) L parietal w/ L SDH s/p crani 08/09/2019  . Subdural hematoma, acute ([place]) 08/09/2019  . Healthcare maintenance 06/20/2019  . History of strabismus surgery 03/31/2019  . Moderate, recurrent MDD 06/06/2018  . 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 Osteoarthritis of right knee 06/05/2018  . Essential hypertension 06/05/2018  . Anxiety 06/05/2018  . Age-related nuclear cataract of both eyes 11/26/2014  . Diplopia 11/26/2014  . Monocular esotropia of left eye 11/26/2014  . Corneal transplant failure 10/27/2011    Jones Bales, PT, DPT 04/02/2020, 11:54 AM  [facility] [address] [place], [place], [address] Phone: [phone]   Fax:  [phone]  Name: Michelle Carrillo MRN: [id number] Date of Birth: [birth year]

## 2020-04-03 ENCOUNTER — Ambulatory Visit (AMBULATORY_SURGERY_CENTER): Payer: Self-pay

## 2020-04-03 VITALS — Ht 59.0 in | Wt 158.0 lb

## 2020-04-03 DIAGNOSIS — Z1211 Encounter for screening for malignant neoplasm of colon: Secondary | ICD-10-CM

## 2020-04-03 NOTE — Progress Notes (Signed)
Spanish interpreter/daughter present for pre visit appt; No egg or soy allergy known to patient  No issues with past sedation with any surgeries or procedures No intubation problems in the past  No FH of Malignant Hyperthermia No diet pills per patient No home 02 use per patient  No blood thinners per patient  Pt denies issues with constipation  No A fib or A flutter  EMMI video via [place] 19 guidelines implemented in PV today with Pt and RN  Pt is fully vaccinated  for Covid x 2;  Coupon given to pt in PV today , Code to Pharmacy   Due to the COVID-19 pandemic we are asking patients to follow certain guidelines.  Pt aware of COVID protocols and [facility] guidelines

## 2020-04-08 ENCOUNTER — Ambulatory Visit: Payer: Medicare Other

## 2020-04-08 ENCOUNTER — Ambulatory Visit: Payer: Medicare Other | Admitting: Occupational Therapy

## 2020-04-08 ENCOUNTER — Other Ambulatory Visit: Payer: Self-pay

## 2020-04-08 DIAGNOSIS — R41840 Attention and concentration deficit: Secondary | ICD-10-CM

## 2020-04-08 DIAGNOSIS — R41844 Frontal lobe and executive function deficit: Secondary | ICD-10-CM

## 2020-04-08 DIAGNOSIS — R41841 Cognitive communication deficit: Secondary | ICD-10-CM

## 2020-04-08 DIAGNOSIS — R41842 Visuospatial deficit: Secondary | ICD-10-CM | POA: Diagnosis not present

## 2020-04-08 DIAGNOSIS — M6281 Muscle weakness (generalized): Secondary | ICD-10-CM | POA: Diagnosis not present

## 2020-04-08 DIAGNOSIS — I69398 Other sequelae of cerebral infarction: Secondary | ICD-10-CM

## 2020-04-08 DIAGNOSIS — H539 Unspecified visual disturbance: Secondary | ICD-10-CM

## 2020-04-08 DIAGNOSIS — R2689 Other abnormalities of gait and mobility: Secondary | ICD-10-CM

## 2020-04-08 DIAGNOSIS — R278 Other lack of coordination: Secondary | ICD-10-CM

## 2020-04-08 DIAGNOSIS — R2681 Unsteadiness on feet: Secondary | ICD-10-CM

## 2020-04-08 NOTE — Therapy (Signed)
[name], [place], [address] Phone: [phone]   Fax:  [phone]  Speech Language Pathology Treatment/Progress note  Patient Details  Name: Michelle Carrillo MRN: [id number] Date of Birth: 1950/06/14 Referring Provider (SLP): Frann Rider, NP   Encounter Date: 04/08/2020   End of Session - 04/08/20 1607    Visit Number 10    Number of Visits 17    Date for SLP Re-Evaluation 04/14/20    SLP Start Time 1450    SLP Stop Time  1530    SLP Time Calculation (min) 40 min    Activity Tolerance Patient tolerated treatment well           Past Medical History:  Diagnosis Date  . Anxiety    on meds  . Arthritis    bilateral knees  . Cataract    RIGHT eye  . Depression    on meds  . Herpes zoster kerstitis s/p corneal transplant 2015   . Hypertension    on meds  . Right knee osteoarthritis   . Strabismus    due to eye injury as a child  . Stroke [facility]) 07/2019   stroke with craniotomy    Past Surgical History:  Procedure Laterality Date  . CESAREAN SECTION      x 2   . CHOLECYSTECTOMY    . CORNEAL TRANSPLANT Left    2006 and 2016  . CRANIOTOMY Left 08/09/2019   Procedure: CRANIOTOMY HEMATOMA EVACUATION SUBDURAL;  Surgeon: Ashok Pall, MD;  Location: [facility];  Service: Neurosurgery;  Laterality: Left;  left    There were no vitals filed for this visit.   Subjective Assessment - 04/08/20 1505    Subjective Pt here with Michelle Carrillo-daughter    Patient is accompained by: Family member   Michelle Carrillo   Currently in Pain? Yes    Pain Score 3     Pain Location Knee    Pain Orientation Right    Pain Descriptors / Indicators Aching    Pain Type Chronic pain    Pain Onset 1 to 4 weeks ago    Pain Frequency Occasional                 ADULT SLP TREATMENT - 04/08/20 1506      General Information   Behavior/Cognition Alert;Cooperative;Pleasant mood      Treatment Provided    Treatment provided Cognitive-Linquistic      Cognitive-Linquistic Treatment   Treatment focused on Cognition    Skilled Treatment Daughter arrived with pt has questions about therapy course duration and about tasks at home to assist. SLP told daughter the household tasks that would be good for concentration/attention. SLP engaged pt in attention tasks separating items into two categories and pt req'd mod A usually faded to occasionally for placing into correct set.      Assessment / Recommendations / Plan   Plan Continue with current plan of care      Progression Toward Goals   Progression toward goals Progressing toward goals            SLP Education - 04/08/20 1606    Education Details home tasks, factors pertaining to therapy course    Person(s) Educated Patient;Child(ren)   Michelle Carrillo   Methods Explanation    Comprehension Verbalized understanding            SLP Short Term Goals - 04/08/20 1608      SLP SHORT TERM GOAL #1  Title Pt wil recall and request am meds with occasional min A from caregivers over 3 sessions    Status Achieved      SLP SHORT TERM GOAL #2   Title Pt and family will carryover 3 compensations for auditory comprehension so pt can accurately follow directions and understand daily plan    Status Partially Met      SLP SHORT TERM GOAL #3   Title Pt will be oriented to day and time as measured by her verbalizing times she needs to get dressed and leave for her appointments with compensatory strategies and cues from family    Baseline 03/17/20; 03/19/20    Status Achieved      SLP SHORT TERM GOAL #4   Title Complete CLQT    Status Deferred            SLP Long Term Goals - 04/08/20 1608      SLP LONG TERM GOAL #1   Title Pt will use external aids to be oriented to date, time and daily plan with rare min A from family    Status Achieved   list of appointmetns and dates     SLP LONG TERM GOAL #2   Title Pt will get ready for appointments and daily  tasks in timely manner with rare min A from family    Time 3    Period Weeks    Status On-going      SLP LONG TERM GOAL #3   Title Pt will ID and correct language errors in converation with occasional min A from family over 2 sessions    Time 3    Period Weeks    Status On-going            Plan - 04/08/20 1607    Clinical Impression Statement Michelle Carrillo is referred for outpt ST s/p SDH/craniotomy due to ongoing cognitive communication impairments. Pt arrived alone with daugher Michelle Carrillo to therapy today and SLP took opportunity to answer questions related to Michelle Carrillo's therapy. SLP believes pt may have some mild expressive aphasia but today mentioned she gets "lost" in conversation - SLP will have to evaluate furhter - may be attenitonal-based.  See 'skilled intervention" for further details on today's session.  I recommend cont'd skilled ST to maximize cognitive linguistic skills for safety, QOL and to reduce caregiver burden    Speech Therapy Frequency 2x / week    Duration 8 weeks   17 visits   Treatment/Interventions SLP instruction and feedback;Compensatory strategies;Functional tasks;Cognitive reorganization;Compensatory techniques;Cueing hierarchy;Environmental controls;Patient/family education;Multimodal communcation approach;Internal/external aids    Potential to Achieve Goals Good    Potential Considerations Ability to learn/carryover information           Patient will benefit from skilled therapeutic intervention in order to improve the following deficits and impairments:   Cognitive communication deficit   Speech Therapy Progress Note  Dates of Reporting Period: 02-18-20 to present  Subjective Statement: Pt has had 10 ST visits targeting cognitive linguisitcs - mostly attention.  Objective: See above  Goal Update: See above  Plan: Pt to cont to be seen for ST services with goals above.  Reason Skilled Services are Required: Pt has not reached max potential and  family needs to cont to be trained how to assist pt at home.   Problem List Patient Active Problem List   Diagnosis Date Noted  . Eye pain 03/13/2020  . Hyperkalemia 01/14/2020  . Chronic RUQ pain 01/14/2020  . Sensation of fullness  in ear, bilateral 01/11/2020  . Aphasia due to acute stroke ([place]) 08/23/2019  . Hyperlipemia 08/23/2019  . ICH (intracerebral hemorrhage) (HCC) L parietal w/ L SDH s/p crani 08/09/2019  . Subdural hematoma, acute ([place]) 08/09/2019  . Healthcare maintenance 06/20/2019  . History of strabismus surgery 03/31/2019  . Moderate, recurrent MDD 06/06/2018  . 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 Osteoarthritis of right knee 06/05/2018  . Essential hypertension 06/05/2018  . Anxiety 06/05/2018  . Age-related nuclear cataract of both eyes 11/26/2014  . Diplopia 11/26/2014  . Monocular esotropia of left eye 11/26/2014  . Corneal transplant failure 10/27/2011    [facility] ,MS, CCC-SLP  04/08/2020, 4:13 PM  [facility] [address] [place], [place], [address] Phone: [phone]   Fax:  [phone]   Name: PEGAH SEGEL MRN: [id number] Date of Birth: 10-01-1950

## 2020-04-08 NOTE — Therapy (Signed)
[facility] [address] [place], [place], [address] Phone: [phone]   Fax:  [phone]  Physical Therapy Treatment/Re-Certification  Patient Details  Name: Michelle Carrillo MRN: [id number] Date of Birth: August 06, 1950 Referring Provider (PT): Referred by Joni Reining, DO. Followed by Frann Rider, NP   Encounter Date: 04/08/2020   PT End of Session - 04/08/20 1400    Visit Number 11    Number of Visits 16    Date for PT Re-Evaluation 06/07/20   POC for 8 weeks, Cert for 90 days   Authorization Type Medicare (10th Visit PN)    Progress Note Due on Visit 10    PT Start Time 1324   finishing up with OT   PT Stop Time 1445    PT Time Calculation (min) 42 min    Equipment Utilized During Treatment Gait belt    Activity Tolerance Patient tolerated treatment well    Behavior During Therapy WFL for tasks assessed/performed           Past Medical History:  Diagnosis Date  . Anxiety    on meds  . Arthritis    bilateral knees  . Cataract    RIGHT eye  . Depression    on meds  . Herpes zoster kerstitis s/p corneal transplant 2015   . Hypertension    on meds  . Right knee osteoarthritis   . Strabismus    due to eye injury as a child  . Stroke [facility]) 07/2019   stroke with craniotomy    Past Surgical History:  Procedure Laterality Date  . CESAREAN SECTION      x 2   . CHOLECYSTECTOMY    . CORNEAL TRANSPLANT Left    2006 and 2016  . CRANIOTOMY Left 08/09/2019   Procedure: CRANIOTOMY HEMATOMA EVACUATION SUBDURAL;  Surgeon: Ashok Pall, MD;  Location: [facility];  Service: Neurosurgery;  Laterality: Left;  left    There were no vitals filed for this visit.   Subjective Assessment - 04/08/20 1401    Subjective Patient reports shoulder is feeling better. No new changes/complaints since last visit. No falls.    Patient is accompained by: Interpreter;Family member   Wilhemena Durie, [place] Contracted Therapy   Pertinent  History HTN, Anxiety, Depression, OA, Strabismus    Limitations Walking    Patient Stated Goals walk better    Currently in Pain? Yes    Pain Score 3     Pain Location Knee    Pain Orientation Right    Pain Descriptors / Indicators Aching    Pain Type Chronic pain    Pain Onset 1 to 4 weeks ago              [facility] PT Assessment - 04/08/20 1420      Assessment   Medical Diagnosis Nontraumatic L Cortical Hemorrhage    Referring Provider (PT) Referred by Joni Reining, DO. Followed by Frann Rider, NP      Functional Gait  Assessment   Gait assessed  Yes    Gait Level Surface Walks 20 ft in less than 7 sec but greater than 5.5 sec, uses assistive device, slower speed, mild gait deviations, or deviates 6-10 in outside of the 12 in walkway width.    Change in Gait Speed Able to change speed, demonstrates mild gait deviations, deviates 6-10 in outside of the 12 in walkway width, or no gait deviations, unable to achieve a major change in velocity, or uses a change  in velocity, or uses an assistive device.    Gait with Horizontal Head Turns Performs head turns smoothly with slight change in gait velocity (eg, minor disruption to smooth gait path), deviates 6-10 in outside 12 in walkway width, or uses an assistive device.    Gait with Vertical Head Turns Performs task with slight change in gait velocity (eg, minor disruption to smooth gait path), deviates 6 - 10 in outside 12 in walkway width or uses assistive device    Gait and Pivot Turn Pivot turns safely within 3 sec and stops quickly with no loss of balance.    Step Over Obstacle Is able to step over one shoe box (4.5 in total height) without changing gait speed. No evidence of imbalance.    Gait with Narrow Base of Support Ambulates 4-7 steps.    Gait with Eyes Closed Walks 20 ft, slow speed, abnormal gait pattern, evidence for imbalance, deviates 10-15 in outside 12 in walkway width. Requires more than 9 sec to ambulate 20 ft.     Ambulating Backwards Walks 20 ft, uses assistive device, slower speed, mild gait deviations, deviates 6-10 in outside 12 in walkway width.    Steps Alternating feet, must use rail.    Total Score 19    FGA comment: 19/30               [facility] Adult PT Treatment/Exercise - 04/08/20 0001      Transfers   Transfers Sit to Stand;Stand to Sit    Sit to Stand 6: Modified independent (Device/Increase time)    Stand to Sit 6: Modified independent (Device/Increase time)      Ambulation/Gait   Ambulation/Gait Yes    Ambulation/Gait Assistance 5: Supervision    Ambulation/Gait Assistance Details throughout therapy gym with activities    Assistive device None    Gait Pattern Within Functional Limits    Ambulation Surface Level;Indoor      Standardized Balance Assessment   Standardized Balance Assessment Berg Balance Test      Berg Balance Test   Sit to Stand Able to stand without using hands and stabilize independently    Standing Unsupported Able to stand safely 2 minutes    Sitting with Back Unsupported but Feet Supported on Floor or Stool Able to sit safely and securely 2 minutes    Stand to Sit Sits safely with minimal use of hands    Transfers Able to transfer safely, minor use of hands    Standing Unsupported with Eyes Closed Able to stand 10 seconds safely    Standing Ubsupported with Feet Together Able to place feet together independently and stand 1 minute safely    From Standing, Reach Forward with Outstretched Arm Can reach forward >12 cm safely (5")    From Standing Position, Pick up Object from Floor Able to pick up shoe safely and easily    From Standing Position, Turn to Look Behind Over each Shoulder Looks behind from both sides and weight shifts well    Turn 360 Degrees Able to turn 360 degrees safely in 4 seconds or less    Standing Unsupported, Alternately Place Feet on Step/Stool Able to stand independently and safely and complete 8 steps in 20 seconds    Standing  Unsupported, One Foot in Front Able to place foot tandem independently and hold 30 seconds    Standing on One Leg Tries to lift leg/unable to hold 3 seconds but remains standing independently    Total Score 52  Self-Care   Self-Care Other Self-Care Comments    Other Self-Care Comments  PT educating on reduced freq and updated POC; went through with patient and daughter and adjusted patietn schedule to ensure no gaps between therapy appointments at there request. Printed new calender for patient.      Exercises   Exercises Other Exercises    Other Exercises  Verbal review of current HEP to ensure compliance/completion. Educated on trying to complete at least 4x/week for improved balance.                      PT Short Term Goals - 03/17/20 1411      PT SHORT TERM GOAL #1   Title patient will be independent with inital HEP for strengthening/balance (ALL STGS Due: 03/17/20)    Baseline decreased compliance, completing 2-3x/weekly    Time 4    Period Weeks    Status Partially Met    Target Date 03/17/20      PT SHORT TERM GOAL #2   Title Patient will improve Berg Balance to >/= 45/56 to demonstrate reduced fall risk    Baseline 43/56; 47/56    Time 4    Period Weeks    Status Achieved      PT SHORT TERM GOAL #3   Title Patient will demonstrate ability to ambulate x 300 ft without AD and demo proper visual scanning to allow for improved safety with household/community mobility and compensate for visual deficits    Baseline decreased scanning noted, able to ambulate x 300 ft    Time 4    Period Weeks    Status Partially Met             PT Long Term Goals - 04/08/20 1419      PT LONG TERM GOAL #1   Title Patient will be independent with final HEP for strengthening/balance (ALL LTGS DUE: 04/14/2020)    Baseline reports completing 3x/week    Time 8    Period Weeks    Status Partially Met      PT LONG TERM GOAL #2   Title Patient will improve TUG to </= 12 seconds  without AD to demosntrate reduced fall risk    Baseline 14.32 secs; 11.37 secs    Time 8    Period Weeks    Status Achieved      PT LONG TERM GOAL #3   Title Patient will improve gait speed to >/= 2.7 ft/sec to demonstrate improved community mobility    Baseline 2.14 ft/sec; 3.4 ft/sec    Time 8    Period Weeks    Status Achieved      PT LONG TERM GOAL #4   Title Patient will improve Berg Balance Score to >/= 50/56 to demonstrate reduced risk for falls and improved balance    Baseline 43/56; 52/56    Time 8    Period Weeks    Status Achieved      PT LONG TERM GOAL #5   Title Patient will be able to complete 5x sit <> stand in </= 12 seconds without UE support to demonstrate improved balance    Baseline 16.9 secs; 11.2 secs    Time 8    Period Weeks    Status Achieved            Updated Short Term Goals:   PT Short Term Goals - 04/08/20 1627      PT SHORT TERM GOAL #1  Title Patient will be independent with progres balance HEP and report compliance of at least 4x/week (All STGS Due: 04/22/20)    Baseline decreased compliance, completing 2-3x/weekly    Time 2    Period Weeks    Status Revised    Target Date 04/22/20          Updated Long Term Goals:     PT Long Term Goals - 04/08/20 1629      PT LONG TERM GOAL #1   Title Patient will be independent with final progressive HEP for strengthening/balance (ALL LTGS DUE: 05/13/20)    Baseline reports completing 3x/week    Time 5    Period Weeks    Status Revised    Target Date 05/13/20      PT LONG TERM GOAL #2   Title Patient will improve TUG to </= 10 seconds without AD to demonstrate further reduced fall risk    Baseline 14.32 secs; 11.37 secs    Time 5    Period Weeks    Status Revised      PT LONG TERM GOAL #3   Title Patient will improve FGA to >/= 23/30 to demosntrate improved balance and reduced fall risk.    Baseline 19/30    Time 5    Period Weeks    Status New               Plan -  04/08/20 1536    Clinical Impression Statement Today's skilled PT session included finishing assesment of progress toward LTG. Patient has met LTG #2-5 demosntrating improved standing balance and functional mobility. Complted high level balance test today including FGA with patient scoring 19/30 demonstrating medium fall risk. Patient will continue to benefit from skilled PT services to address balance impairments and continue to reduce fall risk. Educated patient and daughter on updated POC, visit adjustment and progress toward all LTGs. patient and daughter verbalizing understanding.    Personal Factors and Comorbidities Comorbidity 3+    Comorbidities HTN, Anxiety, Depression, OA, Strabismus    Examination-Activity Limitations Caring for Others;Locomotion Level;Stairs;Transfers    Examination-Participation Restrictions Cleaning;Community Activity;Medication Management    Stability/Clinical Decision Making Stable/Uncomplicated    Rehab Potential Good    PT Frequency 1x / week    PT Duration --   5 weeks   PT Treatment/Interventions ADLs/Self Care Home Management;DME Instruction;Stair training;Gait training;Functional mobility training;Therapeutic activities;Balance training;Neuromuscular re-education;Therapeutic exercise;Patient/family education;Orthotic Fit/Training;Manual techniques;Vestibular;Passive range of motion    PT Next Visit Plan review entire HEP and update. visual scanning. SciFit for Endurance. BLE strengthening. SLS activites    PT Home Exercise Plan Access Code: [email or web address]    Consulted and Agree with Plan of Care Family member/caregiver;Patient    Family Member Consulted Daughter           Patient will benefit from skilled therapeutic intervention in order to improve the following deficits and impairments:  Abnormal gait,Decreased balance,Decreased mobility,Difficulty walking,Decreased endurance,Pain,Decreased strength,Decreased safety awareness,Decreased knowledge of use of  DME,Decreased activity tolerance,Decreased range of motion,Impaired vision/preception  Visit Diagnosis: Muscle weakness (generalized)  Other abnormalities of gait and mobility  Unsteadiness on feet     Problem List Patient Active Problem List   Diagnosis Date Noted  . Eye pain 03/13/2020  . Hyperkalemia 01/14/2020  . Chronic RUQ pain 01/14/2020  . Sensation of fullness in ear, bilateral 01/11/2020  . Aphasia due to acute stroke ([place]) 08/23/2019  . Hyperlipemia 08/23/2019  . ICH (intracerebral hemorrhage) (HCC) L parietal w/ L SDH s/p crani 08/09/2019  .  Subdural hematoma, acute ([facility]) 08/09/2019  . Healthcare maintenance 06/20/2019  . History of strabismus surgery 03/31/2019  . Moderate, recurrent MDD 06/06/2018  . 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 Osteoarthritis of right knee 06/05/2018  . Essential hypertension 06/05/2018  . Anxiety 06/05/2018  . Age-related nuclear cataract of both eyes 11/26/2014  . Diplopia 11/26/2014  . Monocular esotropia of left eye 11/26/2014  . Corneal transplant failure 10/27/2011    Jones Bales, PT, DPT 04/08/2020, 4:27 PM  [facility] [address] [place], [place], [address] Phone: [phone]   Fax:  [phone]  Name: WAFA MARTES MRN: [id number] Date of Birth: 09-23-1950

## 2020-04-08 NOTE — Therapy (Signed)
[facility] [address] [place], [place], [address] Phone: [phone]   Fax:  [phone]  Occupational Therapy Treatment  Patient Details  Name: Michelle Carrillo MRN: [id number] Date of Birth: [birth year] Referring Provider (OT): Frann Rider, NP   Encounter Date: 04/08/2020   OT End of Session - 04/08/20 1357    Visit Number 3    Number of Visits 17    Date for OT Re-Evaluation 05/21/20    Authorization Type Medicare    Authorization - Visit Number 3    Authorization - Number of Visits 10    OT Start Time [id number]    OT Stop Time 1400    OT Time Calculation (min) 43 min           Past Medical History:  Diagnosis Date  . Anxiety    on meds  . Arthritis    bilateral knees  . Cataract    RIGHT eye  . Depression    on meds  . Herpes zoster kerstitis s/p corneal transplant 2015   . Hypertension    on meds  . Right knee osteoarthritis   . Strabismus    due to eye injury as a child  . Stroke [facility]) 07/2019   stroke with craniotomy    Past Surgical History:  Procedure Laterality Date  . CESAREAN SECTION      x 2   . CHOLECYSTECTOMY    . CORNEAL TRANSPLANT Left    2006 and 2016  . CRANIOTOMY Left 08/09/2019   Procedure: CRANIOTOMY HEMATOMA EVACUATION SUBDURAL;  Surgeon: Ashok Pall, MD;  Location: [facility];  Service: Neurosurgery;  Laterality: Left;  left    There were no vitals filed for this visit.   Subjective Assessment - 04/08/20 1337    Subjective  Pt reports shoulder pain is a little better now    Pertinent History PMH: HTN, Anxiety, Depression, OA, Strabismus   Presented to ED on 08/09/19 with altered mental status. CT scan of the head showed a large moderate size cortically based hematoma in the left parietal region with extension into the subdural space resulting in a fair sized subdural hematoma with left-to-right midline shift. Patient recieved inpatient rehab services and was discharged  home    Patient Stated Goals I want to get better and better    Currently in Pain? Yes    Pain Score 3     Pain Location Shoulder    Pain Orientation Right    Pain Descriptors / Indicators Aching    Pain Type Acute pain;Chronic pain    Pain Onset 1 to 4 weeks ago    Pain Frequency Occasional    Aggravating Factors  mopping    Pain Relieving Factors rest, repositioning                   Treatment: Supine hotpack applied to right shoulder while therapist performed gentle joint mobs to shoulder, followed by AA/ROM scapular and shoulder retraction, then pt transitioned to closed chain chest press, and shoulder flexion AA/ROM closed chain, mod facilitation and v.c initially then pt performed without assist. Pt required v.c to avoid internal rotation and shoulder hike. Environmental scanning 3/15 missed first pass, (almost 4, however pt went back after passing 1 card) min v.c to locate remainder). Tabletop scanning 62M, 2/16 items missed increased time required for task.(almost 10 mins to scan approximately 1/4 of page.)             OT  Education - 04/08/20 1400    Education Details shoulder flexion in supine closed with ball, gentle movement only stop if pain, dtr present and translated.    Person(s) Educated Patient;Child(ren)   dter interpreted for patient   Methods Explanation;Demonstration;Verbal cues    Comprehension Verbalized understanding;Returned demonstration;Verbal cues required;Need further instruction            OT Short Term Goals - 04/02/20 1239      OT SHORT TERM GOAL #1   Title I with inital HEP.    Time 4    Status On-going    Target Date 04/23/20      OT SHORT TERM GOAL #2   Title Pt/ caregiever will verbalize understanding of compensatory strategies for visual deficits.    Time 4    Period Weeks    Status On-going      OT SHORT TERM GOAL #3   Title Pt will demonstrate ability to sequence a simple functional task/ home management task with  no more than min v.c    Time 4    Period Weeks    Status On-going      OT SHORT TERM GOAL #4   Title Pt will perform environmental scanning with 80% or better accuracy in a min distracting envireonment.    Time 4    Period Weeks    Status On-going      OT SHORT TERM GOAL #5   Title Pt will complete table top scanning tasks with 80% accuracy or better    Time 4    Period Weeks    Status On-going             OT Long Term Goals - 04/02/20 1241      OT LONG TERM GOAL #1   Title Pt will complete a simple cooking task including locating items in a resonable amount of time modified independently.    Time 8    Status On-going      OT LONG TERM GOAL #2   Title Pt. will demonstrate improved bilateral UE coordination as evidenced by decreasing 9 hole peg test score by 5 secs.    Time 8    Period Weeks    Status On-going      OT LONG TERM GOAL #3   Title Pt will complete tabletop scanning activities with 90% or better accuracy.    Time 8    Period Weeks    Status On-going      OT LONG TERM GOAL #4   Title Pt will locate items in a mod distracting environment with 90% or better accuracy.    Time 8    Period Weeks    Status On-going                 Plan - 04/08/20 1357    Clinical Impression Statement Pt reports her pain is better today. Pt's dtr reports pt started mopping. Therapist recommends pt hold off on mopping at this time to minimize risk of pain    OT Occupational Profile and History Detailed Assessment- Review of Records and additional review of physical, cognitive, psychosocial history related to current functional performance    Occupational performance deficits (Please refer to evaluation for details): ADL's;IADL's;Leisure;Social Participation    Body Structure / Function / Physical Skills ADL;Balance;Mobility;Strength;UE functional use;FMC;Gait;Coordination;Decreased knowledge of precautions;GMC;ROM;IADL;Dexterity;Decreased knowledge of use of DME     Cognitive Skills Attention;Memory;Problem Solve;Safety Awareness;Sequencing;Thought;Understand    Rehab Potential Good    Clinical Decision Making Several  treatment options, min-mod task modification necessary    Comorbidities Affecting Occupational Performance: May have comorbidities impacting occupational performance    Modification or Assistance to Complete Evaluation  Min-Moderate modification of tasks or assist with assess necessary to complete eval    OT Frequency 2x / week    OT Duration 8 weeks    OT Treatment/Interventions Self-care/ADL training;Therapeutic exercise;Balance training;Aquatic Therapy;Ultrasound;Neuromuscular education;Manual Therapy;Splinting;Therapeutic activities;Cryotherapy;Paraffin;DME and/or AE instruction;Cognitive remediation/compensation;Visual/perceptual remediation/compensation;Fluidtherapy;Gait Training;Moist Heat;Contrast Bath;Passive range of motion;Patient/family education    Plan RUE proximal shoulder strengthening, coordination HEP    Consulted and Agree with Plan of Care Patient;Family member/caregiver    Family Member Consulted daughter who interprets for her           Patient will benefit from skilled therapeutic intervention in order to improve the following deficits and impairments:   Body Structure / Function / Physical Skills: ADL,Balance,Mobility,Strength,UE functional use,FMC,Gait,Coordination,Decreased knowledge of precautions,GMC,ROM,IADL,Dexterity,Decreased knowledge of use of DME Cognitive Skills: Attention,Memory,Problem Solve,Safety Awareness,Sequencing,Thought,Understand     Visit Diagnosis: Visuospatial deficit  Frontal lobe and executive function deficit  Attention and concentration deficit  Visual disturbance as complication of stroke  Other lack of coordination    Problem List Patient Active Problem List   Diagnosis Date Noted  . Eye pain 03/13/2020  . Hyperkalemia 01/14/2020  . Chronic RUQ pain 01/14/2020  .  Sensation of fullness in ear, bilateral 01/11/2020  . Aphasia due to acute stroke ([facility]CC) 08/23/2019  . Hyperlipemia 08/23/2019  . ICH (intracerebral hemorrhage) ([facility]CC) L parietal w/ L SDH s/p crani 08/09/2019  . Subdural hematoma, acute ([facility]) 08/09/2019  . Healthcare maintenance 06/20/2019  . History of strabismus surgery 03/31/2019  . Moderate, recurrent MDD 06/06/2018  . 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 Osteoarthritis of right knee 06/05/2018  . Essential hypertension 06/05/2018  . Anxiety 06/05/2018  . Age-related nuclear cataract of both eyes 11/26/2014  . Diplopia 11/26/2014  . Monocular esotropia of left eye 11/26/2014  . Corneal transplant failure 10/27/2011    Michelle Carrillo 04/08/2020, 2:54 PM  [facility] [address] [place], [place], [address] Phone: [phone]   Fax:  [phone]  Name: Michelle Carrillo MRN: [id number] Date of Birth: 1950-09-07

## 2020-04-10 ENCOUNTER — Encounter: Payer: Self-pay | Admitting: Occupational Therapy

## 2020-04-10 ENCOUNTER — Ambulatory Visit: Payer: Medicare Other | Admitting: Occupational Therapy

## 2020-04-10 ENCOUNTER — Ambulatory Visit: Payer: Medicare Other

## 2020-04-10 ENCOUNTER — Other Ambulatory Visit: Payer: Self-pay

## 2020-04-10 DIAGNOSIS — R41842 Visuospatial deficit: Secondary | ICD-10-CM

## 2020-04-10 DIAGNOSIS — R41844 Frontal lobe and executive function deficit: Secondary | ICD-10-CM

## 2020-04-10 DIAGNOSIS — H539 Unspecified visual disturbance: Secondary | ICD-10-CM

## 2020-04-10 DIAGNOSIS — M6281 Muscle weakness (generalized): Secondary | ICD-10-CM

## 2020-04-10 DIAGNOSIS — R2681 Unsteadiness on feet: Secondary | ICD-10-CM

## 2020-04-10 DIAGNOSIS — R41840 Attention and concentration deficit: Secondary | ICD-10-CM | POA: Diagnosis not present

## 2020-04-10 DIAGNOSIS — R2689 Other abnormalities of gait and mobility: Secondary | ICD-10-CM

## 2020-04-10 DIAGNOSIS — R278 Other lack of coordination: Secondary | ICD-10-CM

## 2020-04-10 DIAGNOSIS — I69398 Other sequelae of cerebral infarction: Secondary | ICD-10-CM | POA: Diagnosis not present

## 2020-04-10 DIAGNOSIS — R41841 Cognitive communication deficit: Secondary | ICD-10-CM | POA: Diagnosis not present

## 2020-04-10 NOTE — Therapy (Signed)
Michelle Carrillo [address] Michelle Carrillo, [place], [address] Phone: [phone]   Fax:  [phone]  Speech Language Pathology Treatment  Patient Details  Name: Michelle Carrillo MRN: [id number] Date of Birth: 04/05/1951 Referring Provider (SLP): Michelle Rider, NP   Encounter Date: 04/10/2020   End of Session - 04/10/20 1432    Visit Number 11    Number of Visits 17    Date for SLP Re-Evaluation 04/14/20    SLP Start Time 61    SLP Stop Time  1400    SLP Time Calculation (min) 42 min    Activity Tolerance Patient tolerated treatment well           Past Medical History:  Diagnosis Date  . Anxiety    on meds  . Arthritis    bilateral knees  . Cataract    RIGHT eye  . Depression    on meds  . Herpes zoster kerstitis s/p corneal transplant 2015   . Hypertension    on meds  . Right knee osteoarthritis   . Strabismus    due to eye injury as a child  . Stroke [facility]) 07/2019   stroke with craniotomy    Past Surgical History:  Procedure Laterality Date  . CESAREAN SECTION      x 2   . CHOLECYSTECTOMY    . CORNEAL TRANSPLANT Left    2006 and 2016  . CRANIOTOMY Left 08/09/2019   Procedure: CRANIOTOMY HEMATOMA EVACUATION SUBDURAL;  Surgeon: Michelle Pall, MD;  Location: [facility];  Service: Neurosurgery;  Laterality: Left;  left    There were no vitals filed for this visit.   Subjective Assessment - 04/10/20 1329    Subjective Michelle Carrillo here and so is interpreter.    Patient is accompained by: Interpreter;Family member    Currently in Pain? No/denies                 ADULT SLP TREATMENT - 04/10/20 1330      General Information   Behavior/Cognition Alert;Cooperative;Pleasant mood      Treatment Provided   Treatment provided Cognitive-Linquistic      Cognitive-Linquistic Treatment   Treatment focused on Cognition    Skilled Treatment SLP spent this session talking with daughter and Michelle Carrillo about  difficulty pt has with doing things during the day - pt stated parly motivational but also due to day passing by and not doing much. SLP suggested pt sit with daughter Michelle Carrillo and pt plan one task pt will do the next day and the time she will complete it, then either daughter can call pt to remind her. SLP suggested writing steps for dishwasher emptying or clothes folding but stated a checklist of steps could be used for any household task to assist pt staying focused for task completion. Pt appeared amenable to this plan. Daughter Michelle Carrillo to communicate this plan with daughter Michelle Carrillo who is at home with pt.      Assessment / Recommendations / Plan   Plan Continue with current plan of care      Progression Toward Goals   Progression toward goals Progressing toward goals            SLP Education - 04/10/20 1431    Education Details how to assist pt at home with task completion, checklist may help    Person(s) Educated Patient;Child(ren)    Methods Explanation;Demonstration;Verbal cues    Comprehension Verbalized understanding;Verbal cues required;Need further instruction  SLP Short Term Goals - 04/08/20 1608      SLP SHORT TERM GOAL #1   Title Pt wil recall and request am meds with occasional min A from caregivers over 3 sessions    Status Achieved      SLP SHORT TERM GOAL #2   Title Pt and family will carryover 3 compensations for auditory comprehension so pt can accurately follow directions and understand daily plan    Status Partially Met      SLP SHORT TERM GOAL #3   Title Pt will be oriented to day and time as measured by her verbalizing times she needs to get dressed and leave for her appointments with compensatory strategies and cues from family    Baseline 03/17/20; 03/19/20    Status Achieved      SLP SHORT TERM GOAL #4   Title Complete CLQT    Status Deferred            SLP Long Term Goals - 04/10/20 1432      SLP LONG TERM GOAL #1   Title Pt will use  external aids to be oriented to date, time and daily plan with rare min A from family    Status Achieved   list of appointmetns and dates     SLP LONG TERM GOAL #2   Title Pt will get ready for appointments and daily tasks in timely manner with rare min A from family    Time 3    Period Weeks    Status On-going      SLP LONG TERM GOAL #3   Title Pt will ID and correct language errors in converation with occasional min A from family over 2 sessions    Time 3    Period Weeks    Status On-going            Plan - 04/10/20 1432    Clinical Impression Statement Michelle Carrillo is referred for outpt ST s/p SDH/craniotomy due to ongoing cognitive communication impairments.  See 'skilled intervention" for further details on today's session.  I recommend cont'd skilled ST to maximize cognitive linguistic skills for safety, QOL and to reduce caregiver burden    Speech Therapy Frequency 2x / week    Duration 8 weeks   17 visits   Treatment/Interventions SLP instruction and feedback;Compensatory strategies;Functional tasks;Cognitive reorganization;Compensatory techniques;Cueing hierarchy;Environmental controls;Patient/family education;Multimodal communcation approach;Internal/external aids    Potential to Achieve Goals Good    Potential Considerations Ability to learn/carryover information           Patient will benefit from skilled therapeutic intervention in order to improve the following deficits and impairments:   Cognitive communication deficit    Problem List Patient Active Problem List   Diagnosis Date Noted  . Eye pain 03/13/2020  . Hyperkalemia 01/14/2020  . Chronic RUQ pain 01/14/2020  . Sensation of fullness in ear, bilateral 01/11/2020  . Aphasia due to acute stroke ([place]) 08/23/2019  . Hyperlipemia 08/23/2019  . ICH (intracerebral hemorrhage) (HCC) L parietal w/ L SDH s/p crani 08/09/2019  . Subdural hematoma, acute ([place]) 08/09/2019  . Healthcare maintenance  06/20/2019  . History of strabismus surgery 03/31/2019  . Moderate, recurrent MDD 06/06/2018  . 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 Osteoarthritis of right knee 06/05/2018  . Essential hypertension 06/05/2018  . Anxiety 06/05/2018  . Age-related nuclear cataract of both eyes 11/26/2014  . Diplopia 11/26/2014  . Monocular esotropia of left eye 11/26/2014  .  Corneal transplant failure 10/27/2011    [facility] ,[place], CCC-SLP  04/10/2020, 2:33 PM  [facility] [address] [place], [place], [address] Phone: [phone]   Fax:  [phone]   Name: Michelle Carrillo MRN: [id number] Date of Birth: 1950-10-19

## 2020-04-10 NOTE — Patient Instructions (Addendum)
Access Code: [email or web address] URL: [email or web address] Date: 04/10/2020 Prepared by: Jethro Bastos  Exercises Tandem Walking with Counter Support - 1 x daily - 5 x weekly - 1 sets - 5 reps Walking with Head Rotation - 1 x daily - 5 x weekly - 1 sets - 10 reps Romberg Stance Eyes Closed on Foam Pad - 1 x daily - 5 x weekly - 1 sets - 3 reps - 30 hold Romberg Stance on Foam Pad with Head Rotation - 1 x daily - 5 x weekly - 2 sets - 10 reps Romberg Stance with Head Nods on Foam Pad - 1 x daily - 5 x weekly - 2 sets - 10 reps Tandem Stance - 1 x daily - 5 x weekly - 1 sets - 3 reps - 30 hold  (Spanish Translation Provided)

## 2020-04-10 NOTE — Patient Instructions (Signed)
   Plan one task the next day and then have Carley Hammed plan what time she will do it.  Making a checklist for the task is a good idea to help keep Carley Hammed focused for the whole task.

## 2020-04-10 NOTE — Therapy (Signed)
[facility] [address] [place], [place], [address] Phone: [phone]   Fax:  [phone]  Occupational Therapy Treatment  Patient Details  Name: Michelle Carrillo MRN: [id number] Date of Birth: 08-Mar-1951 Referring Provider (OT): Ihor Austin, NP   Encounter Date: 04/10/2020   OT End of Session - 04/10/20 1506    Visit Number 4    Number of Visits 17    Date for OT Re-Evaluation 05/21/20    Authorization Type Medicare    Authorization - Visit Number 4    Authorization - Number of Visits 10           Past Medical History:  Diagnosis Date  . Anxiety    on meds  . Arthritis    bilateral knees  . Cataract    RIGHT eye  . Depression    on meds  . Herpes zoster kerstitis s/p corneal transplant 2015   . Hypertension    on meds  . Right knee osteoarthritis   . Strabismus    due to eye injury as a child  . Stroke [facility]) 07/2019   stroke with craniotomy    Past Surgical History:  Procedure Laterality Date  . CESAREAN SECTION      x 2   . CHOLECYSTECTOMY    . CORNEAL TRANSPLANT Left    2006 and 2016  . CRANIOTOMY Left 08/09/2019   Procedure: CRANIOTOMY HEMATOMA EVACUATION SUBDURAL;  Surgeon: Coletta Memos, MD;  [facility];  Service: Neurosurgery;  Laterality: Left;  left    There were no vitals filed for this visit.   Subjective Assessment - 04/10/20 1545    Subjective  Pt reports shoulder pain is a little better now    Pertinent History PMH: HTN, Anxiety, Depression, OA, Strabismus   Presented to ED on 08/09/19 with altered mental status. CT scan of the head showed a large moderate size cortically based hematoma in the left parietal region with extension into the subdural space resulting in a fair sized subdural hematoma with left-to-right midline shift. Patient recieved inpatient rehab services and was discharged home    Patient Stated Goals I want to get better and better    Currently in  Pain? No/denies                Treatment:Closed chain shoulder flexion x 10 reps seated min v.c Seated at table number copying task 64M print, mod difficulty and increased time, line guide uses. Copying small peg design with left and right UE's, max difficulty and v.c to follow instructions. Completing 12 piece puzzle for visual and cognitive  skills, max difficulty/ v.c interpreter present.                  OT Short Term Goals - 04/02/20 1239      OT SHORT TERM GOAL #1   Title I with inital HEP.    Time 4    Status On-going    Target Date 04/23/20      OT SHORT TERM GOAL #2   Title Pt/ caregiever will verbalize understanding of compensatory strategies for visual deficits.    Time 4    Period Weeks    Status On-going      OT SHORT TERM GOAL #3   Title Pt will demonstrate ability to sequence a simple functional task/ home management task with no more than min v.c    Time 4    Period Weeks    Status On-going  OT SHORT TERM GOAL #4   Title Pt will perform environmental scanning with 80% or better accuracy in a min distracting envireonment.    Time 4    Period Weeks    Status On-going      OT SHORT TERM GOAL #5   Title Pt will complete table top scanning tasks with 80% accuracy or better    Time 4    Period Weeks    Status On-going             OT Long Term Goals - 04/02/20 1241      OT LONG TERM GOAL #1   Title Pt will complete a simple cooking task including locating items in a resonable amount of time modified independently.    Time 8    Status On-going      OT LONG TERM GOAL #2   Title Pt. will demonstrate improved bilateral UE coordination as evidenced by decreasing 9 hole peg test score by 5 secs.    Time 8    Period Weeks    Status On-going      OT LONG TERM GOAL #3   Title Pt will complete tabletop scanning activities with 90% or better accuracy.    Time 8    Period Weeks    Status On-going      OT LONG TERM GOAL #4   Title  Pt will locate items in a mod distracting environment with 90% or better accuracy.    Time 8    Period Weeks    Status On-going                 Plan - 04/10/20 1547    Clinical Impression Statement Pt's pain is better today. She demonstrates improved shoulder A/ROM.    OT Occupational Profile and History Detailed Assessment- Review of Records and additional review of physical, cognitive, psychosocial history related to current functional performance    Occupational performance deficits (Please refer to evaluation for details): ADL's;IADL's;Leisure;Social Participation    Body Structure / Function / Physical Skills ADL;Balance;Mobility;Strength;UE functional use;FMC;Gait;Coordination;Decreased knowledge of precautions;GMC;ROM;IADL;Dexterity;Decreased knowledge of use of DME    Cognitive Skills Attention;Memory;Problem Solve;Safety Awareness;Sequencing;Thought;Understand    Rehab Potential Good    Clinical Decision Making Several treatment options, min-mod task modification necessary    Comorbidities Affecting Occupational Performance: May have comorbidities impacting occupational performance    Modification or Assistance to Complete Evaluation  Min-Moderate modification of tasks or assist with assess necessary to complete eval    OT Frequency 2x / week    OT Duration 8 weeks    OT Treatment/Interventions Self-care/ADL training;Therapeutic exercise;Balance training;Aquatic Therapy;Ultrasound;Neuromuscular education;Manual Therapy;Splinting;Therapeutic activities;Cryotherapy;Paraffin;DME and/or AE instruction;Cognitive remediation/compensation;Visual/perceptual remediation/compensation;Fluidtherapy;Gait Training;Moist Heat;Contrast Bath;Passive range of motion;Patient/family education    Plan coordination HEP    Consulted and Agree with Plan of Care Patient;Family member/caregiver    Family Member Consulted daughter who interprets for her           Patient will benefit from skilled  therapeutic intervention in order to improve the following deficits and impairments:   Body Structure / Function / Physical Skills: ADL,Balance,Mobility,Strength,UE functional use,FMC,Gait,Coordination,Decreased knowledge of precautions,GMC,ROM,IADL,Dexterity,Decreased knowledge of use of DME Cognitive Skills: Attention,Memory,Problem Solve,Safety Awareness,Sequencing,Thought,Understand     Visit Diagnosis: Muscle weakness (generalized)  Visuospatial deficit  Frontal lobe and executive function deficit  Attention and concentration deficit  Visual disturbance as complication of stroke  Other lack of coordination  Other abnormalities of gait and mobility    Problem List Patient Active Problem List  Diagnosis Date Noted  . Eye pain 03/13/2020  . Hyperkalemia 01/14/2020  . Chronic RUQ pain 01/14/2020  . Sensation of fullness in ear, bilateral 01/11/2020  . Aphasia due to acute stroke ([place]) 08/23/2019  . Hyperlipemia 08/23/2019  . ICH (intracerebral hemorrhage) (HCC) L parietal w/ L SDH s/p crani 08/09/2019  . Subdural hematoma, acute ([place]) 08/09/2019  . Healthcare maintenance 06/20/2019  . History of strabismus surgery 03/31/2019  . Moderate, recurrent MDD 06/06/2018  . 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 Osteoarthritis of right knee 06/05/2018  . Essential hypertension 06/05/2018  . Anxiety 06/05/2018  . Age-related nuclear cataract of both eyes 11/26/2014  . Diplopia 11/26/2014  . Monocular esotropia of left eye 11/26/2014  . Corneal transplant failure 10/27/2011    Makhi Muzquiz 04/10/2020, 3:54 PM  [facility] [address] [place], [place], [address] Phone: [phone]   Fax:  [phone]  Name: Michelle Carrillo MRN: [id number] Date of Birth: 06/09/1950

## 2020-04-10 NOTE — Therapy (Signed)
[facility] [address] [place], [place], [address] Phone: [phone]   Fax:  [phone]  Physical Therapy Treatment  Patient Details  Name: Michelle Carrillo MRN: [id number] Date of Birth: 10-14-1950 Referring Provider (PT): Referred by Carlynn Purl, DO. Followed by Ihor Austin, NP   Encounter Date: 04/10/2020   PT End of Session - 04/10/20 1400    Visit Number 12    Number of Visits 16    Date for PT Re-Evaluation 06/07/20   updated POC for 5 weeks, cert for 60 days   Authorization Type Medicare (10th Visit PN)    Progress Note Due on Visit 10    PT Start Time 1402    PT Stop Time 1445    PT Time Calculation (min) 43 min    Equipment Utilized During Treatment Gait belt    Activity Tolerance Patient tolerated treatment well    Behavior During Therapy WFL for tasks assessed/performed           Past Medical History:  Diagnosis Date  . Anxiety    on meds  . Arthritis    bilateral knees  . Cataract    RIGHT eye  . Depression    on meds  . Herpes zoster kerstitis s/p corneal transplant 2015   . Hypertension    on meds  . Right knee osteoarthritis   . Strabismus    due to eye injury as a child  . Stroke [facility]) 07/2019   stroke with craniotomy    Past Surgical History:  Procedure Laterality Date  . CESAREAN SECTION      x 2   . CHOLECYSTECTOMY    . CORNEAL TRANSPLANT Left    2006 and 2016  . CRANIOTOMY Left 08/09/2019   Procedure: CRANIOTOMY HEMATOMA EVACUATION SUBDURAL;  Surgeon: Coletta Memos, MD;  [facility];  Service: Neurosurgery;  Laterality: Left;  left    There were no vitals filed for this visit.   Subjective Assessment - 04/10/20 1403    Subjective Continue to report shoulder is feeling better. No new changes. No falls.    Patient is accompained by: Interpreter;Family member   Darien Ramus, [place] Contracted Therapy   Pertinent History HTN, Anxiety, Depression, OA, Strabismus     Limitations Walking    Patient Stated Goals walk better    Currently in Pain? No/denies    Pain Onset 1 to 4 weeks ago               [facility] Adult PT Treatment/Exercise - 04/10/20 0001      Ambulation/Gait   Ambulation/Gait Yes    Ambulation/Gait Assistance 5: Supervision    Ambulation/Gait Assistance Details throughout therapy session with activities    Assistive device None    Gait Pattern Within Functional Limits    Ambulation Surface Level;Indoor      Neuro Re-ed    Neuro Re-ed Details  Standing on airex at stairs: completed alternating toe taps to 1st step x 10 reps, then progressed to second step x 10 reps. increased verbal cues required for proper completion for 2nd step as patient often bringing foot back to 1st step vs. airex. CGA throughout      Knee/Hip Exercises: Aerobic   Other Aerobic Completed SciFit with BLE only, Level 3.0 x 5 minutes for improved strengthening/endurance.            Completed all of the following exercises as entire review/update of current HEP. Bolded exercises include new additions. Verbal  cues required for proper completion.   Access Code: [email or web address] URL: [email or web address] Date: 04/10/2020 Prepared by: Jethro Bastos  Exercises Tandem Walking with Counter Support - 1 x daily - 5 x weekly - 1 sets - 5 reps Walking with Head Rotation - 1 x daily - 5 x weekly - 1 sets - 10 reps Romberg Stance Eyes Closed on Foam Pad - 1 x daily - 5 x weekly - 1 sets - 3 reps - 30 hold Romberg Stance on Foam Pad with Head Rotation - 1 x daily - 5 x weekly - 2 sets - 10 reps Romberg Stance with Head Nods on Foam Pad - 1 x daily - 5 x weekly - 2 sets - 10 reps Tandem Stance - 1 x daily - 5 x weekly - 1 sets - 3 reps - 30 hold  Spanish Translation of exercises provided.  Patient also requesting information on airex to allow for improved challenge with exercises at home vs. Pillow. PT provided information and nameo fbalance pad as option for  purchase.       PT Education - 04/10/20 1401    Education Details HEP review/update; Option for purchase for balance pad per patient request    Person(s) Educated Patient;Child(ren)    Methods Explanation;Handout;Demonstration    Comprehension Verbalized understanding;Returned demonstration;Verbal cues required            PT Short Term Goals - 04/08/20 1627      PT SHORT TERM GOAL #1   Title Patient will be independent with progres balance HEP and report compliance of at least 4x/week (All STGS Due: 04/22/20)    Baseline decreased compliance, completing 2-3x/weekly    Time 2    Period Weeks    Status Revised    Target Date 04/22/20             PT Long Term Goals - 04/08/20 1629      PT LONG TERM GOAL #1   Title Patient will be independent with final progressive HEP for strengthening/balance (ALL LTGS DUE: 05/13/20)    Baseline reports completing 3x/week    Time 5    Period Weeks    Status Revised    Target Date 05/13/20      PT LONG TERM GOAL #2   Title Patient will improve TUG to </= 10 seconds without AD to demonstrate further reduced fall risk    Baseline 14.32 secs; 11.37 secs    Time 5    Period Weeks    Status Revised      PT LONG TERM GOAL #3   Title Patient will improve FGA to >/= 23/30 to demosntrate improved balance and reduced fall risk.    Baseline 19/30    Time 5    Period Weeks    Status New                 Plan - 04/10/20 1445    Clinical Impression Statement Today's skilled PT session focused on review of current HEP and progressing as tolerated by patient. Still require verbal cues intermittently for proper completion. Will continue to progress toward all LTGs.    Personal Factors and Comorbidities Comorbidity 3+    Comorbidities HTN, Anxiety, Depression, OA, Strabismus    Examination-Activity Limitations Caring for Others;Locomotion Level;Stairs;Transfers    Examination-Participation Restrictions Cleaning;Community Activity;Medication  Management    Stability/Clinical Decision Making Stable/Uncomplicated    Rehab Potential Good    PT Frequency 1x / week    PT Duration --  5 weeks   PT Treatment/Interventions ADLs/Self Care Home Management;DME Instruction;Stair training;Gait training;Functional mobility training;Therapeutic activities;Balance training;Neuromuscular re-education;Therapeutic exercise;Patient/family education;Orthotic Fit/Training;Manual techniques;Vestibular;Passive range of motion    PT Next Visit Plan how is update to exercises? continue high level balance. dynamic gait.  visual scanning. SciFit for Endurance. BLE strengthening. SLS activites    PT Home Exercise Plan Access Code: [email or web address]    Consulted and Agree with Plan of Care Family member/caregiver;Patient    Family Member Consulted Daughter           Patient will benefit from skilled therapeutic intervention in order to improve the following deficits and impairments:  Abnormal gait,Decreased balance,Decreased mobility,Difficulty walking,Decreased endurance,Pain,Decreased strength,Decreased safety awareness,Decreased knowledge of use of DME,Decreased activity tolerance,Decreased range of motion,Impaired vision/preception  Visit Diagnosis: Muscle weakness (generalized)  Other abnormalities of gait and mobility  Unsteadiness on feet     Problem List Patient Active Problem List   Diagnosis Date Noted  . Eye pain 03/13/2020  . Hyperkalemia 01/14/2020  . Chronic RUQ pain 01/14/2020  . Sensation of fullness in ear, bilateral 01/11/2020  . Aphasia due to acute stroke ([place]) 08/23/2019  . Hyperlipemia 08/23/2019  . ICH (intracerebral hemorrhage) (HCC) L parietal w/ L SDH s/p crani 08/09/2019  . Subdural hematoma, acute ([place]) 08/09/2019  . Healthcare maintenance 06/20/2019  . History of strabismus surgery 03/31/2019  . Moderate, recurrent MDD 06/06/2018  . 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  Osteoarthritis of right knee 06/05/2018  . Essential hypertension 06/05/2018  . Anxiety 06/05/2018  . Age-related nuclear cataract of both eyes 11/26/2014  . Diplopia 11/26/2014  . Monocular esotropia of left eye 11/26/2014  . Corneal transplant failure 10/27/2011    Jones Bales, PT, DPT 04/10/2020, 3:32 PM  [facility] [address] [place], [place], [address] Phone: [phone]   Fax:  [phone]  Name: SAMANTHAN DUGO MRN: [id number] Date of Birth: 1950/05/25

## 2020-04-15 ENCOUNTER — Ambulatory Visit: Payer: Medicare Other

## 2020-04-15 ENCOUNTER — Ambulatory Visit: Payer: Medicare Other | Attending: Adult Health | Admitting: Occupational Therapy

## 2020-04-15 ENCOUNTER — Other Ambulatory Visit: Payer: Self-pay

## 2020-04-15 ENCOUNTER — Encounter: Payer: Self-pay | Admitting: Occupational Therapy

## 2020-04-15 DIAGNOSIS — M6281 Muscle weakness (generalized): Secondary | ICD-10-CM

## 2020-04-15 DIAGNOSIS — R2681 Unsteadiness on feet: Secondary | ICD-10-CM | POA: Diagnosis not present

## 2020-04-15 DIAGNOSIS — I69398 Other sequelae of cerebral infarction: Secondary | ICD-10-CM | POA: Insufficient documentation

## 2020-04-15 DIAGNOSIS — R41840 Attention and concentration deficit: Secondary | ICD-10-CM

## 2020-04-15 DIAGNOSIS — R41842 Visuospatial deficit: Secondary | ICD-10-CM

## 2020-04-15 DIAGNOSIS — R2689 Other abnormalities of gait and mobility: Secondary | ICD-10-CM

## 2020-04-15 DIAGNOSIS — H539 Unspecified visual disturbance: Secondary | ICD-10-CM | POA: Diagnosis not present

## 2020-04-15 DIAGNOSIS — R278 Other lack of coordination: Secondary | ICD-10-CM | POA: Diagnosis not present

## 2020-04-15 DIAGNOSIS — R41841 Cognitive communication deficit: Secondary | ICD-10-CM | POA: Insufficient documentation

## 2020-04-15 NOTE — Patient Instructions (Signed)
   Please sit down with Michelle Carrillo and have her think of one task the next day,  and then have Michelle Carrillo plan what time she will do it.  Put a note on the refrigerator reminding her of the task she chose.  Making a checklist for the task is a good idea to help keep Michelle Carrillo focused for the whole task, and ensure that she doesn't forget anything.

## 2020-04-15 NOTE — Therapy (Signed)
[facility] [address] [place], [place], [address] Phone: [phone]   Fax:  [phone]  Physical Therapy Treatment  Patient Details  Name: Michelle Carrillo MRN: [id number] Date of Birth: Nov 21, 1950 Referring Provider (PT): Referred by Joni Reining, DO. Followed by Frann Rider, NP   Encounter Date: 04/15/2020   PT End of Session - 04/15/20 1400    Visit Number 13    Number of Visits 16    Date for PT Re-Evaluation 06/07/20   updated POC for 5 weeks, cert for 60 days   Authorization Type Medicare (10th Visit PN)    Progress Note Due on Visit 10    PT Start Time 1401    PT Stop Time 1444    PT Time Calculation (min) 43 min    Equipment Utilized During Treatment Gait belt    Activity Tolerance Patient tolerated treatment well    Behavior During Therapy WFL for tasks assessed/performed           Past Medical History:  Diagnosis Date  . Anxiety    on meds  . Arthritis    bilateral knees  . Cataract    RIGHT eye  . Depression    on meds  . Herpes zoster kerstitis s/p corneal transplant 2015   . Hypertension    on meds  . Right knee osteoarthritis   . Strabismus    due to eye injury as a child  . Stroke [facility]) 07/2019   stroke with craniotomy    Past Surgical History:  Procedure Laterality Date  . CESAREAN SECTION      x 2   . CHOLECYSTECTOMY    . CORNEAL TRANSPLANT Left    2006 and 2016  . CRANIOTOMY Left 08/09/2019   Procedure: CRANIOTOMY HEMATOMA EVACUATION SUBDURAL;  Surgeon: Ashok Pall, MD;  Location: [place];  Service: Neurosurgery;  Laterality: Left;  left    There were no vitals filed for this visit.   Subjective Assessment - 04/15/20 1401    Subjective No new changes/falls since last visit. Patient reports updated exercises are going well at home.    Patient is accompained by: Interpreter;Family member   Wilhemena Durie, [place] Contracted Therapy   Pertinent History HTN, Anxiety, Depression,  OA, Strabismus    Limitations Walking    Patient Stated Goals walk better    Currently in Pain? No/denies    Pain Onset 1 to 4 weeks ago               [facility] Adult PT Treatment/Exercise - 04/15/20 0001      Ambulation/Gait   Ambulation/Gait Yes    Ambulation/Gait Assistance 5: Supervision    Ambulation/Gait Assistance Details througout therapy gym with activities    Assistive device None    Gait Pattern Within Functional Limits    Ambulation Surface Level;Indoor      High Level Balance   High Level Balance Activities Negotiating over obstacles    High Level Balance Comments Completed ambulation with negotiation over orange hurdles x 3 laps down and backk. Verbal cues required to avoid circumduction along with shadowing PT standing beside patient to block circumduction. 3rd lap PT added in addition of dual task: including counting upwards by 1's.      Neuro Re-ed    Neuro Re-ed Details  Completed standing on rockerboard positioned ant/post completed bean bag toss x 10 bags without UE support. Improved balance noted. Second set completed standing on thick dense blue foam completed x  10 bags. Increased challenge and use of hip strategy on thick blue foam required. CGA throughout with completion.      Exercises   Exercises Knee/Hip      Knee/Hip Exercises: Aerobic   Other Aerobic Completed SciFit with BLE only, Level 3 x 7 minutes with BLE for improved endurance/strengthening.               Balance Exercises - 04/15/20 0001      Balance Exercises: Standing   Standing Eyes Opened Narrow base of support (BOS);Foam/compliant surface;Head turns;Limitations    Standing Eyes Opened Limitations completed horizontal/vertical head turns x 10 reps each direction. progressed to completing with eyes closed x 10 reps each direction.    Standing Eyes Closed Narrow base of support (BOS);Foam/compliant surface;3 reps;30 secs    Standing Eyes Closed Limitations completed standing eyes  closed 3 x 30 seconds.    Tandem Stance Eyes open;Foam/compliant surface;2 reps;30 secs;Limitations    Tandem Stance Time completed tandem stance on airex, completed first rep bilaterally with eyes open, second rep addition of horizontal/vertical head turns x 10 reps each direction.    Tandem Gait Forward;Intermittent upper extremity support;Foam/compliant surface;3 reps;Limitations    Tandem Gait Limitations completed tandem gait x 3 laps forward in // bars with intermittent UE support required.    Sidestepping Foam/compliant support;3 reps;Limitations    Sidestepping Limitations completed side stepping down and back blue balance beam x 3 laps, no UE support required but CGA from PT               PT Short Term Goals - 04/08/20 1627      PT SHORT TERM GOAL #1   Title Patient will be independent with progres balance HEP and report compliance of at least 4x/week (All STGS Due: 04/22/20)    Baseline decreased compliance, completing 2-3x/weekly    Time 2    Period Weeks    Status Revised    Target Date 04/22/20             PT Long Term Goals - 04/08/20 1629      PT LONG TERM GOAL #1   Title Patient will be independent with final progressive HEP for strengthening/balance (ALL LTGS DUE: 05/13/20)    Baseline reports completing 3x/week    Time 5    Period Weeks    Status Revised    Target Date 05/13/20      PT LONG TERM GOAL #2   Title Patient will improve TUG to </= 10 seconds without AD to demonstrate further reduced fall risk    Baseline 14.32 secs; 11.37 secs    Time 5    Period Weeks    Status Revised      PT LONG TERM GOAL #3   Title Patient will improve FGA to >/= 23/30 to demosntrate improved balance and reduced fall risk.    Baseline 19/30    Time 5    Period Weeks    Status New                 Plan - 04/15/20 1500    Clinical Impression Statement Continued progression of balance activites today, progressing to dense thick lbue foam with patient  tolerating well. Continued activites working on negotiating over obstacles with addition of cognitive task. CGA still required and increased verbal cues. Will continue to progress toward all LTGs.    Personal Factors and Comorbidities Comorbidity 3+    Comorbidities HTN, Anxiety, Depression, OA, Strabismus  Examination-Activity Limitations Caring for Others;Locomotion Level;Stairs;Transfers    Examination-Participation Restrictions Cleaning;Community Activity;Medication Management    Stability/Clinical Decision Making Stable/Uncomplicated    Rehab Potential Good    PT Frequency 1x / week    PT Duration --   5 weeks   PT Treatment/Interventions ADLs/Self Care Home Management;DME Instruction;Stair training;Gait training;Functional mobility training;Therapeutic activities;Balance training;Neuromuscular re-education;Therapeutic exercise;Patient/family education;Orthotic Fit/Training;Manual techniques;Vestibular;Passive range of motion    PT Next Visit Plan continue high level balance. dynamic gait.  visual scanning. SciFit for Endurance. BLE strengthening. SLS activites    PT Home Exercise Plan Access Code: [email or web address]    Consulted and Agree with Plan of Care Family member/caregiver;Patient    Family Member Consulted Daughter           Patient will benefit from skilled therapeutic intervention in order to improve the following deficits and impairments:  Abnormal gait,Decreased balance,Decreased mobility,Difficulty walking,Decreased endurance,Pain,Decreased strength,Decreased safety awareness,Decreased knowledge of use of DME,Decreased activity tolerance,Decreased range of motion,Impaired vision/preception  Visit Diagnosis: Muscle weakness (generalized)  Other abnormalities of gait and mobility  Unsteadiness on feet     Problem List Patient Active Problem List   Diagnosis Date Noted  . Eye pain 03/13/2020  . Hyperkalemia 01/14/2020  . Chronic RUQ pain 01/14/2020  . Sensation of  fullness in ear, bilateral 01/11/2020  . Aphasia due to acute stroke ([place]) 08/23/2019  . Hyperlipemia 08/23/2019  . ICH (intracerebral hemorrhage) (HCC) L parietal w/ L SDH s/p crani 08/09/2019  . Subdural hematoma, acute ([place]) 08/09/2019  . Healthcare maintenance 06/20/2019  . History of strabismus surgery 03/31/2019  . Moderate, recurrent MDD 06/06/2018  . 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 Osteoarthritis of right knee 06/05/2018  . Essential hypertension 06/05/2018  . Anxiety 06/05/2018  . Age-related nuclear cataract of both eyes 11/26/2014  . Diplopia 11/26/2014  . Monocular esotropia of left eye 11/26/2014  . Corneal transplant failure 10/27/2011    Jones Bales, PT, DPT 04/15/2020, 3:02 PM  [facility] [address] [place], [place], [address] Phone: [phone]   Fax:  [phone]  Name: Michelle Carrillo MRN: [id number] Date of Birth: 01-27-1951

## 2020-04-15 NOTE — Therapy (Signed)
[facility] [address] [place], [place], [address] Phone: [phone]   Fax:  [phone]  Occupational Therapy Treatment  Patient Details  Name: Michelle Carrillo MRN: [id number] Date of Birth: May 07, 1950 Referring Provider (OT): Ihor Austin, NP   Encounter Date: 04/15/2020   OT End of Session - 04/15/20 1553    Visit Number 5    Number of Visits 17    Date for OT Re-Evaluation 05/21/20    Authorization Type Medicare    Authorization - Visit Number 5    Authorization - Number of Visits 10    OT Start Time 1449    OT Stop Time 1530    OT Time Calculation (min) 41 min    Activity Tolerance Patient tolerated treatment well    Behavior During Therapy [facility] for tasks assessed/performed           Past Medical History:  Diagnosis Date  . Anxiety    on meds  . Arthritis    bilateral knees  . Cataract    RIGHT eye  . Depression    on meds  . Herpes zoster kerstitis s/p corneal transplant 2015   . Hypertension    on meds  . Right knee osteoarthritis   . Strabismus    due to eye injury as a child  . Stroke [facility]) 07/2019   stroke with craniotomy    Past Surgical History:  Procedure Laterality Date  . CESAREAN SECTION      x 2   . CHOLECYSTECTOMY    . CORNEAL TRANSPLANT Left    2006 and 2016  . CRANIOTOMY Left 08/09/2019   Procedure: CRANIOTOMY HEMATOMA EVACUATION SUBDURAL;  Surgeon: Coletta Memos, MD;  [facility];  Service: Neurosurgery;  Laterality: Left;  left    There were no vitals filed for this visit.                 OT Treatments/Exercises (OP) - 04/15/20 0001      Cognitive Exercises   Other Cognitive Exercises 1 Worked on simple card sorting task to address focused attention, and visual scanning. Placed cards in order from lowest to highest to foster organized visual scanning.  Patient having difficulty with organization of information, and switching mental set from  task just completed to new task.  Daughter present and explained rationale behind this activity,and encouraged to try at home.  Patient also given homework to bring back next session - transposing 7 digit numbers from left side of page to right.      Neurological Re-education Exercises   Other Exercises 1 Patient without shoulder pain this session.  She reports pain overall much better, but a general ache.  Patient is not able to accurately report pain, but movement in proximal shoulder appears to be more balanced this session.    Other Exercises 2 Initiated coordiantion exercise program today.  Patient having diffiuculty following verbal instruction or demonstrattion (question apraxia in addition to attention)                  OT Education - 04/15/20 1552    Education Details sorting playing cards    Person(s) Educated Patient;Child(ren)    Methods Explanation;Demonstration;Tactile cues    Comprehension Verbalized understanding;Returned demonstration;Need further instruction            OT Short Term Goals - 04/15/20 1555      OT SHORT TERM GOAL #1   Title I with inital HEP.  Time 4    Status On-going    Target Date 04/23/20      OT SHORT TERM GOAL #2   Title Pt/ caregiever will verbalize understanding of compensatory strategies for visual deficits.    Time 4    Period Weeks    Status On-going      OT SHORT TERM GOAL #3   Title Pt will demonstrate ability to sequence a simple functional task/ home management task with no more than min v.c    Time 4    Period Weeks    Status On-going      OT SHORT TERM GOAL #4   Title Pt will perform environmental scanning with 80% or better accuracy in a min distracting envireonment.    Time 4    Period Weeks    Status On-going      OT SHORT TERM GOAL #5   Title Pt will complete table top scanning tasks with 80% accuracy or better    Time 4    Period Weeks    Status On-going             OT Long Term Goals - 04/15/20  1555      OT LONG TERM GOAL #1   Title Pt will complete a simple cooking task including locating items in a resonable amount of time modified independently.    Time 8    Status On-going      OT LONG TERM GOAL #2   Title Pt. will demonstrate improved bilateral UE coordination as evidenced by decreasing 9 hole peg test score by 5 secs.    Time 8    Period Weeks    Status On-going      OT LONG TERM GOAL #3   Title Pt will complete tabletop scanning activities with 90% or better accuracy.    Time 8    Period Weeks    Status On-going      OT LONG TERM GOAL #4   Title Pt will locate items in a mod distracting environment with 90% or better accuracy.    Time 8    Period Weeks    Status On-going                 Plan - 04/15/20 1553    Clinical Impression Statement Pt has shown significant improvement in physical skills in RUE, still limited by visual and cognitive impairments    OT Occupational Profile and History Detailed Assessment- Review of Records and additional review of physical, cognitive, psychosocial history related to current functional performance    Occupational performance deficits (Please refer to evaluation for details): ADL's;IADL's;Leisure;Social Participation    Body Structure / Function / Physical Skills ADL;Balance;Mobility;Strength;UE functional use;FMC;Gait;Coordination;Decreased knowledge of precautions;GMC;ROM;IADL;Dexterity;Decreased knowledge of use of DME    Cognitive Skills Attention;Memory;Problem Solve;Safety Awareness;Sequencing;Thought;Understand    Rehab Potential Good    Clinical Decision Making Several treatment options, min-mod task modification necessary    Comorbidities Affecting Occupational Performance: May have comorbidities impacting occupational performance    Modification or Assistance to Complete Evaluation  Min-Moderate modification of tasks or assist with assess necessary to complete eval    OT Frequency 2x / week    OT Duration 8  weeks    OT Treatment/Interventions Self-care/ADL training;Therapeutic exercise;Balance training;Aquatic Therapy;Ultrasound;Neuromuscular education;Manual Therapy;Splinting;Therapeutic activities;Cryotherapy;Paraffin;DME and/or AE instruction;Cognitive remediation/compensation;Visual/perceptual remediation/compensation;Fluidtherapy;Gait Training;Moist Heat;Contrast Bath;Passive range of motion;Patient/family education    Plan coordination HEP, environmental scanning, sequencing familiar functional task - e.g. cooking task    Consulted and Agree  with Plan of Care Patient;Family member/caregiver    Family Member Consulted daughter who interprets for her           Patient will benefit from skilled therapeutic intervention in order to improve the following deficits and impairments:   Body Structure / Function / Physical Skills: ADL,Balance,Mobility,Strength,UE functional use,FMC,Gait,Coordination,Decreased knowledge of precautions,GMC,ROM,IADL,Dexterity,Decreased knowledge of use of DME Cognitive Skills: Attention,Memory,Problem Solve,Safety Awareness,Sequencing,Thought,Understand     Visit Diagnosis: Visuospatial deficit  Attention and concentration deficit  Visual disturbance as complication of stroke  Other lack of coordination  Muscle weakness (generalized)  Unsteadiness on feet    Problem List Patient Active Problem List   Diagnosis Date Noted  . Eye pain 03/13/2020  . Hyperkalemia 01/14/2020  . Chronic RUQ pain 01/14/2020  . Sensation of fullness in ear, bilateral 01/11/2020  . Aphasia due to acute stroke ([place]) 08/23/2019  . Hyperlipemia 08/23/2019  . ICH (intracerebral hemorrhage) (HCC) L parietal w/ L SDH s/p crani 08/09/2019  . Subdural hematoma, acute ([place]) 08/09/2019  . Healthcare maintenance 06/20/2019  . History of strabismus surgery 03/31/2019  . Moderate, recurrent MDD 06/06/2018  . 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  Osteoarthritis of right knee 06/05/2018  . Essential hypertension 06/05/2018  . Anxiety 06/05/2018  . Age-related nuclear cataract of both eyes 11/26/2014  . Diplopia 11/26/2014  . Monocular esotropia of left eye 11/26/2014  . Corneal transplant failure 10/27/2011    Mariah Milling, OTR/L 04/15/2020, 3:56 PM  [facility] [address] [place], [place], [address] Phone: [phone]   Fax:  [phone]  Name: Michelle Carrillo MRN: [id number] Date of Birth: 12/15/1950

## 2020-04-15 NOTE — Therapy (Signed)
[facility] [address] [place], [place], [address] Phone: [phone]   Fax:  [phone]  Speech Language Pathology Treatment  Patient Details  Name: Michelle Carrillo MRN: [id number] Date of Birth: 1950/05/09 Referring Provider (SLP): Frann Rider, NP   Encounter Date: 04/15/2020   End of Session - 04/15/20 1735    Visit Number 12    Number of Visits 17    Date for SLP Re-Evaluation 04/14/20    SLP Start Time 1533    SLP Stop Time  1615    SLP Time Calculation (min) 42 min    Activity Tolerance Patient tolerated treatment well           Past Medical History:  Diagnosis Date  . Anxiety    on meds  . Arthritis    bilateral knees  . Cataract    RIGHT eye  . Depression    on meds  . Herpes zoster kerstitis s/p corneal transplant 2015   . Hypertension    on meds  . Right knee osteoarthritis   . Strabismus    due to eye injury as a child  . Stroke [facility]) 07/2019   stroke with craniotomy    Past Surgical History:  Procedure Laterality Date  . CESAREAN SECTION      x 2   . CHOLECYSTECTOMY    . CORNEAL TRANSPLANT Left    2006 and 2016  . CRANIOTOMY Left 08/09/2019   Procedure: CRANIOTOMY HEMATOMA EVACUATION SUBDURAL;  Surgeon: Ashok Pall, MD;  Location: [place];  Service: Neurosurgery;  Laterality: Left;  left    There were no vitals filed for this visit.          ADULT SLP TREATMENT - 04/15/20 1644      General Information   Behavior/Cognition Alert;Cooperative;Pleasant mood      Treatment Provided   Treatment provided Cognitive-Linquistic      Cognitive-Linquistic Treatment   Treatment focused on Cognition    Skilled Treatment Pt gave permisison for daughter [name] to translate for this session. SLP spent this session talking with daughter [name], and [name] about pt's homework from last session. Pt did not complete homework as suggested. She did activities as she saw them needing  to be completed. SLP explained rationale for homework as given - pt to sit with daughter [name] and plan a task for pt to do the next day and then to complete the task. SLP reiterated and explalined again  writing steps for dishwasher emptying or clothes folding but stated a checklist of steps could be used for any household task to assist pt staying focused for task completion. Pt appeared amenable to this plan. Daughter [name] again stated she would communicate this plan with daughter [name] who is at home with pt.SLP also strongly suggested [place] arrive with pt once a week for therapies.      Assessment / Recommendations / Plan   Plan Continue with current plan of care      Progression Toward Goals   Progression toward goals Not progressing toward goals (comment)   ? ST communication to home after last session           SLP Education - 04/15/20 1731    Education Details rationale for homework for planning/executing one task/day    Person(s) Educated Patient;Child(ren)    Methods Explanation;Demonstration    Comprehension Verbalized understanding            SLP Short Term Goals - 04/08/20 1608  SLP SHORT TERM GOAL #1   Title Pt wil recall and request am meds with occasional min A from caregivers over 3 sessions    Status Achieved      SLP SHORT TERM GOAL #2   Title Pt and family will carryover 3 compensations for auditory comprehension so pt can accurately follow directions and understand daily plan    Status Partially Met      SLP SHORT TERM GOAL #3   Title Pt will be oriented to day and time as measured by her verbalizing times she needs to get dressed and leave for her appointments with compensatory strategies and cues from family    Baseline 03/17/20; 03/19/20    Status Achieved      SLP SHORT TERM GOAL #4   Title Complete CLQT    Status Deferred            SLP Long Term Goals - 04/15/20 1735      SLP LONG TERM GOAL #1   Title Pt will use external aids to be  oriented to date, time and daily plan with rare min A from family    Status Achieved   list of appointmetns and dates     SLP LONG TERM GOAL #2   Title Pt will get ready for appointments and daily tasks in timely manner with rare min A from family    Time 2    Period Weeks    Status On-going      SLP LONG TERM GOAL #3   Title Pt will ID and correct language errors in converation with occasional min A from family over 2 sessions    Time 2    Period Weeks    Status On-going            Plan - 04/15/20 1735    Clinical Impression Statement Celestina Gironda is referred for outpt ST s/p SDH/craniotomy due to ongoing cognitive communication impairments.  See 'skilled intervention" for further details on today's session.  I recommend cont'd skilled ST to maximize cognitive linguistic skills for safety, QOL and to reduce caregiver burden    Speech Therapy Frequency 2x / week    Duration 8 weeks   17 visits   Treatment/Interventions SLP instruction and feedback;Compensatory strategies;Functional tasks;Cognitive reorganization;Compensatory techniques;Cueing hierarchy;Environmental controls;Patient/family education;Multimodal communcation approach;Internal/external aids    Potential to Achieve Goals Good    Potential Considerations Ability to learn/carryover information           Patient will benefit from skilled therapeutic intervention in order to improve the following deficits and impairments:   Cognitive communication deficit    Problem List Patient Active Problem List   Diagnosis Date Noted  . Eye pain 03/13/2020  . Hyperkalemia 01/14/2020  . Chronic RUQ pain 01/14/2020  . Sensation of fullness in ear, bilateral 01/11/2020  . Aphasia due to acute stroke ([place]) 08/23/2019  . Hyperlipemia 08/23/2019  . ICH (intracerebral hemorrhage) (HCC) L parietal w/ L SDH s/p crani 08/09/2019  . Subdural hematoma, acute ([place]) 08/09/2019  . Healthcare maintenance 06/20/2019  . History of  strabismus surgery 03/31/2019  . Moderate, recurrent MDD 06/06/2018  . 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 Osteoarthritis of right knee 06/05/2018  . Essential hypertension 06/05/2018  . Anxiety 06/05/2018  . Age-related nuclear cataract of both eyes 11/26/2014  . Diplopia 11/26/2014  . Monocular esotropia of left eye 11/26/2014  . Corneal transplant failure 10/27/2011    [facility] ,MS, CCC-SLP  04/15/2020, 5:36 PM  [facility] [address] [place], [place], [address] Phone: [phone]   Fax:  [phone]   Name: JANI MORONTA MRN: [id number] Date of Birth: 15-Feb-1951

## 2020-04-16 ENCOUNTER — Encounter: Payer: Medicare Other | Admitting: Student

## 2020-04-17 ENCOUNTER — Ambulatory Visit: Payer: Medicare Other | Admitting: Physical Therapy

## 2020-04-17 ENCOUNTER — Ambulatory Visit: Payer: Medicare Other | Admitting: Occupational Therapy

## 2020-04-17 DIAGNOSIS — I639 Cerebral infarction, unspecified: Secondary | ICD-10-CM | POA: Diagnosis not present

## 2020-04-17 DIAGNOSIS — H2511 Age-related nuclear cataract, right eye: Secondary | ICD-10-CM | POA: Diagnosis not present

## 2020-04-17 DIAGNOSIS — Z947 Corneal transplant status: Secondary | ICD-10-CM | POA: Diagnosis not present

## 2020-04-17 DIAGNOSIS — H52212 Irregular astigmatism, left eye: Secondary | ICD-10-CM | POA: Diagnosis not present

## 2020-04-20 ENCOUNTER — Telehealth: Payer: Self-pay | Admitting: Physician Assistant

## 2020-04-20 NOTE — Telephone Encounter (Signed)
Telephone Call  04/20/20  Patient's daughter called on call service to ask if her mother needed covid testing prior to Colonoscopy in [place] on Tuesday 04/22/20- she has had both her vaccines longer than three weeks ago, so I reassured her that she did not need to be tested.  Ellouise Newer, PA-C

## 2020-04-22 ENCOUNTER — Ambulatory Visit (AMBULATORY_SURGERY_CENTER): Payer: Medicare Other | Admitting: Gastroenterology

## 2020-04-22 ENCOUNTER — Encounter: Payer: Self-pay | Admitting: Gastroenterology

## 2020-04-22 ENCOUNTER — Other Ambulatory Visit: Payer: Self-pay

## 2020-04-22 VITALS — BP 104/58 | HR 68 | Temp 98.7°F | Resp 10 | Ht 59.0 in | Wt 158.0 lb

## 2020-04-22 DIAGNOSIS — Z1211 Encounter for screening for malignant neoplasm of colon: Secondary | ICD-10-CM | POA: Diagnosis not present

## 2020-04-22 DIAGNOSIS — D123 Benign neoplasm of transverse colon: Secondary | ICD-10-CM | POA: Diagnosis not present

## 2020-04-22 HISTORY — PX: COLONOSCOPY: SHX174

## 2020-04-22 MED ORDER — SODIUM CHLORIDE 0.9 % IV SOLN: 500.0000 mL | Freq: Once | INTRAVENOUS | Status: DC

## 2020-04-22 NOTE — Progress Notes (Signed)
Called to room to assist during endoscopic procedure.  Patient ID and intended procedure confirmed with present staff. Received instructions for my participation in the procedure from the performing physician.  

## 2020-04-22 NOTE — Progress Notes (Signed)
Pt's states no medical or surgical changes since previsit or office visit.  ° °Vitals CW °

## 2020-04-22 NOTE — Progress Notes (Signed)
pt tolerated well. VSS. awake and to recovery. Report given to RN.  

## 2020-04-22 NOTE — Op Note (Signed)
[facility] Patient Name: Michelle Carrillo Procedure Date: 04/22/2020 10:25 AM MRN: [id number] Endoscopist: Ladene Artist , MD Age: 70 Referring MD:  Date of Birth: 10/06/1950 Gender: Female Account #: [id number] Procedure:                Colonoscopy Indications:              Screening for colorectal malignant neoplasm Medicines:                Monitored Anesthesia Care Procedure:                Pre-Anesthesia Assessment:                           - Prior to the procedure, a History and Physical                            was performed, and patient medications and                            allergies were reviewed. The patient's tolerance of                            previous anesthesia was also reviewed. The risks                            and benefits of the procedure and the sedation                            options and risks were discussed with the patient.                            All questions were answered, and informed consent                            was obtained. Prior Anticoagulants: The patient has                            taken no previous anticoagulant or antiplatelet                            agents. ASA Grade Assessment: III - A patient with                            severe systemic disease. After reviewing the risks                            and benefits, the patient was deemed in                            satisfactory condition to undergo the procedure.                           After obtaining informed consent, the colonoscope  was passed under direct vision. Throughout the                            procedure, the patient's blood pressure, pulse, and                            oxygen saturations were monitored continuously. The                            Olympus [id number]) Colonoscope was                            introduced through the anus and advanced to the the                            cecum,  identified by appendiceal orifice and                            ileocecal valve. The ileocecal valve, appendiceal                            orifice, and rectum were photographed. The quality                            of the bowel preparation was good. The colonoscopy                            was performed without difficulty. The patient                            tolerated the procedure well. Scope In: 10:42:36 AM Scope Out: 11:00:37 AM Scope Withdrawal Time: 0 hours 13 minutes 38 seconds  Total Procedure Duration: 0 hours 18 minutes 1 second  Findings:                 The perianal and digital rectal examinations were                            normal.                           Four sessile polyps were found in the transverse                            colon. The polyps were 5 to 7 mm in size. These                            polyps were removed with a cold snare. Resection                            and retrieval were complete.                           A few medium-mouthed diverticula were found in the  left colon.                           The exam was otherwise without abnormality on                            direct and retroflexion views. Complications:            No immediate complications. Estimated blood loss:                            None. Estimated Blood Loss:     Estimated blood loss: none. Impression:               - Four 5 to 7 mm polyps in the transverse colon,                            removed with a cold snare. Resected and retrieved.                           - Diverticulosis in the left colon.                           - The examination was otherwise normal on direct                            and retroflexion views. Recommendation:           - Repeat colonoscopy after studies are complete for                            surveillance based on pathology results.                           - Patient has a contact number available for                             emergencies. The signs and symptoms of potential                            delayed complications were discussed with the                            patient. Return to normal activities tomorrow.                            Written discharge instructions were provided to the                            patient.                           - High fiber diet.                           - Continue present medications.                           -  Await pathology results. Ladene Artist, MD 04/22/2020 11:05:22 AM This report has been signed electronically.

## 2020-04-22 NOTE — Patient Instructions (Signed)
Read all of the handouts given to you by your recovery room nurse. ? ?YOU HAD AN ENDOSCOPIC PROCEDURE TODAY AT THE [facility]:   Refer to the procedure report that was given to you for any specific questions about what was found during the examination.  If the procedure report does not answer your questions, please call your gastroenterologist to clarify.  If you requested that your care partner not be given the details of your procedure findings, then the procedure report has been included in a sealed envelope for you to review at your convenience later. ? ?YOU SHOULD EXPECT: Some feelings of bloating in the abdomen. Passage of more gas than usual.  Walking can help get rid of the air that was put into your GI tract during the procedure and reduce the bloating. If you had a lower endoscopy (such as a colonoscopy or flexible sigmoidoscopy) you may notice spotting of blood in your stool or on the toilet paper. If you underwent a bowel prep for your procedure, you may not have a normal bowel movement for a few days. ? ?Please Note:  You might notice some irritation and congestion in your nose or some drainage.  This is from the oxygen used during your procedure.  There is no need for concern and it should clear up in a day or so. ? ?SYMPTOMS TO REPORT IMMEDIATELY: ? ?Following lower endoscopy (colonoscopy or flexible sigmoidoscopy): ? Excessive amounts of blood in the stool ? Significant tenderness or worsening of abdominal pains ? Swelling of the abdomen that is new, acute ? Fever of 100?F or higher ? ?  ?For urgent or emergent issues, a gastroenterologist can be reached at any hour by calling [phone]. ?Do not use MyChart messaging for urgent concerns.  ? ? ?DIET:  We do recommend a small meal at first, but then you may proceed to your regular diet.  Drink plenty of fluids but you should avoid alcoholic beverages for 24 hours.  Try to eat a high fiber diet, and drink plenty of water. ? ?ACTIVITY:   You should plan to take it easy for the rest of today and you should NOT DRIVE or use heavy machinery until tomorrow (because of the sedation medicines used during the test).   ? ?FOLLOW UP: ?Our staff will call the number listed on your records 48-72 hours following your procedure to check on you and address any questions or concerns that you may have regarding the information given to you following your procedure. If we do not reach you, we will leave a message.  We will attempt to reach you two times.  During this call, we will ask if you have developed any symptoms of COVID 19. If you develop any symptoms (ie: fever, flu-like symptoms, shortness of breath, cough etc.) before then, please call [phone].  If you test positive for Covid 19 in the 2 weeks post procedure, please call and report this information to us.   ? ?If any biopsies were taken you will be contacted by phone or by letter within the next 1-3 weeks.  Please call us at [phone] if you have not heard about the biopsies in 3 weeks.  ? ? ?SIGNATURES/CONFIDENTIALITY: ?You and/or your care partner have signed paperwork which will be entered into your electronic medical record.  These signatures attest to the fact that that the information above on your After Visit Summary has been reviewed and is understood.  Full responsibility of the confidentiality of   discharge information lies with you and/or your care-partner. 

## 2020-04-23 ENCOUNTER — Ambulatory Visit: Payer: Medicare Other

## 2020-04-23 ENCOUNTER — Ambulatory Visit (INDEPENDENT_AMBULATORY_CARE_PROVIDER_SITE_OTHER): Payer: Medicare Other | Admitting: Internal Medicine

## 2020-04-23 ENCOUNTER — Ambulatory Visit: Payer: Medicare Other | Admitting: Occupational Therapy

## 2020-04-23 VITALS — BP 111/55 | HR 76 | Wt 162.8 lb

## 2020-04-23 DIAGNOSIS — R4701 Aphasia: Secondary | ICD-10-CM

## 2020-04-23 DIAGNOSIS — E559 Vitamin D deficiency, unspecified: Secondary | ICD-10-CM | POA: Diagnosis not present

## 2020-04-23 DIAGNOSIS — H539 Unspecified visual disturbance: Secondary | ICD-10-CM | POA: Diagnosis not present

## 2020-04-23 DIAGNOSIS — R41840 Attention and concentration deficit: Secondary | ICD-10-CM | POA: Diagnosis not present

## 2020-04-23 DIAGNOSIS — Z0000 Encounter for general adult medical examination without abnormal findings: Secondary | ICD-10-CM

## 2020-04-23 DIAGNOSIS — R41842 Visuospatial deficit: Secondary | ICD-10-CM | POA: Diagnosis not present

## 2020-04-23 DIAGNOSIS — R278 Other lack of coordination: Secondary | ICD-10-CM | POA: Diagnosis not present

## 2020-04-23 DIAGNOSIS — M6281 Muscle weakness (generalized): Secondary | ICD-10-CM

## 2020-04-23 DIAGNOSIS — I69398 Other sequelae of cerebral infarction: Secondary | ICD-10-CM

## 2020-04-23 DIAGNOSIS — I639 Cerebral infarction, unspecified: Secondary | ICD-10-CM

## 2020-04-23 DIAGNOSIS — R2689 Other abnormalities of gait and mobility: Secondary | ICD-10-CM

## 2020-04-23 DIAGNOSIS — Z23 Encounter for immunization: Secondary | ICD-10-CM | POA: Diagnosis not present

## 2020-04-23 DIAGNOSIS — R2681 Unsteadiness on feet: Secondary | ICD-10-CM

## 2020-04-23 MED ORDER — TETANUS-DIPHTH-ACELL PERTUSSIS 5-2.5-18.5 LF-MCG/0.5 IM SUSP
0.5000 mL | Freq: Once | INTRAMUSCULAR | 0 refills | Status: AC
Start: 2020-04-23 — End: 2020-04-23

## 2020-04-23 NOTE — Patient Instructions (Signed)
[name]  Fue un placer verte en la clnica hoy. Hoy discutimos: - Contine tomando su suplemento de vitamina D. Har un seguimiento de su programacin de exploracin DEXA. - Usted recibi la vacuna contra la neumona hoy. - Por favor programe su vacuna de refuerzo COVID. Tambin le dar una receta para su vacuna contra el ttanos para obtener en su farmacia.  Si tiene alguna pregunta o inquietud, llame a Cleotis Nipper clnica al [phone] [employer] 9 a. m. y las 5 p. m. y despus del horario de atencin llame al [phone] y pregunte por el residente de [place] interna de [place]. Si cree que tiene Engineering geologist, llame al [phone].  Gracias, esperamos poder ayudarlo a mantenerse saludable!

## 2020-04-23 NOTE — Therapy (Signed)
[facility] [address] [place], [place], [address] Phone: [phone]   Fax:  [phone]  Occupational Therapy Treatment  Patient Details  Name: Michelle Carrillo MRN: [id number] Date of Birth: [birth year] Referring Provider (OT): Frann Rider, NP   Encounter Date: 04/23/2020   OT End of Session - 04/23/20 1604    Visit Number 6    Number of Visits 17    Date for OT Re-Evaluation 05/21/20    Authorization Type Medicare    Authorization - Visit Number 6    Authorization - Number of Visits 10    OT Start Time 1500    OT Stop Time 1535    OT Time Calculation (min) 35 min    Activity Tolerance Patient tolerated treatment well    Behavior During Therapy [facility] for tasks assessed/performed           Past Medical History:  Diagnosis Date  . Anxiety    on meds  . Arthritis    bilateral knees  . Cataract    RIGHT eye  . Depression    on meds  . Herpes zoster kerstitis s/p corneal transplant 2015   . Hypertension    on meds  . Right knee osteoarthritis   . Strabismus    due to eye injury as a child  . Stroke [facility]) 07/2019   stroke with craniotomy    Past Surgical History:  Procedure Laterality Date  . CESAREAN SECTION      x 2   . CHOLECYSTECTOMY    . COLONOSCOPY  04/22/2020  . CORNEAL TRANSPLANT Left    2006 and 2016  . CRANIOTOMY Left 08/09/2019   Procedure: CRANIOTOMY HEMATOMA EVACUATION SUBDURAL;  Surgeon: Ashok Pall, MD;  Location: [facility];  Service: Neurosurgery;  Laterality: Left;  left    There were no vitals filed for this visit.   Subjective Assessment - 04/23/20 1608    Subjective  Pt's dtr reports pt is doing a little more at home now    Pertinent History PMH: HTN, Anxiety, Depression, OA, Strabismus   Presented to ED on 08/09/19 with altered mental status. CT scan of the head showed a large moderate size cortically based hematoma in the left parietal region with extension into the  subdural space resulting in a fair sized subdural hematoma with left-to-right midline shift. Patient recieved inpatient rehab services and was discharged home    Patient Stated Goals I want to get better and better    Currently in Pain? No/denies                Treatment: Card sorting task with min-mod difficulty and min v.c -Pt's dtr reports improved performance compaired to last visit. 12 piece puzzle with increased time and mod-max v.c for visual scanning and problem solving  Environmental scanning in mod distracting environment, min-mod difficulty and pt nearly tripped on several items on floor due to visual deficits.                   OT Short Term Goals - 04/23/20 1605      OT SHORT TERM GOAL #1   Title I with inital HEP.    Time 4    Status On-going    Target Date 04/23/20      OT SHORT TERM GOAL #2   Title Pt/ caregiever will verbalize understanding of compensatory strategies for visual deficits.    Time 4    Period Weeks  Status On-going      OT SHORT TERM GOAL #3   Title Pt will demonstrate ability to sequence a simple functional task/ home management task with no more than min v.c    Time 4    Period Weeks    Status On-going      OT SHORT TERM GOAL #4   Title Pt will perform environmental scanning with 80% or better accuracy in a min distracting envireonment.    Time 4    Period Weeks    Status On-going      OT SHORT TERM GOAL #5   Title Pt will complete table top scanning tasks with 80% accuracy or better    Time 4    Period Weeks    Status On-going             OT Long Term Goals - 04/15/20 1555      OT LONG TERM GOAL #1   Title Pt will complete a simple cooking task including locating items in a resonable amount of time modified independently.    Time 8    Status On-going      OT LONG TERM GOAL #2   Title Pt. will demonstrate improved bilateral UE coordination as evidenced by decreasing 9 hole peg test score by 5 secs.     Time 8    Period Weeks    Status On-going      OT LONG TERM GOAL #3   Title Pt will complete tabletop scanning activities with 90% or better accuracy.    Time 8    Period Weeks    Status On-going      OT LONG TERM GOAL #4   Title Pt will locate items in a mod distracting environment with 90% or better accuracy.    Time 8    Period Weeks    Status On-going                 Plan - 04/23/20 1602    Clinical Impression Statement Pt is progressing but is still limited by visual and cognitive impairments    OT Occupational Profile and History Detailed Assessment- Review of Records and additional review of physical, cognitive, psychosocial history related to current functional performance    Occupational performance deficits (Please refer to evaluation for details): ADL's;IADL's;Leisure;Social Participation    Body Structure / Function / Physical Skills ADL;Balance;Mobility;Strength;UE functional use;FMC;Gait;Coordination;Decreased knowledge of precautions;GMC;ROM;IADL;Dexterity;Decreased knowledge of use of DME    Cognitive Skills Attention;Memory;Problem Solve;Safety Awareness;Sequencing;Thought;Understand    Rehab Potential Good    Clinical Decision Making Several treatment options, min-mod task modification necessary    Comorbidities Affecting Occupational Performance: May have comorbidities impacting occupational performance    Modification or Assistance to Complete Evaluation  Min-Moderate modification of tasks or assist with assess necessary to complete eval    OT Frequency 2x / week    OT Duration 8 weeks    OT Treatment/Interventions Self-care/ADL training;Therapeutic exercise;Balance training;Aquatic Therapy;Ultrasound;Neuromuscular education;Manual Therapy;Splinting;Therapeutic activities;Cryotherapy;Paraffin;DME and/or AE instruction;Cognitive remediation/compensation;Visual/perceptual remediation/compensation;Fluidtherapy;Gait Training;Moist Heat;Contrast Bath;Passive range  of motion;Patient/family education    Plan sequencing familiar functional task - e.g. cooking task, tabletop and environmental scanning    Consulted and Agree with Plan of Care Patient;Family member/caregiver    Family Member Consulted daughter who interprets for her           Patient will benefit from skilled therapeutic intervention in order to improve the following deficits and impairments:   Body Structure / Function / Physical Skills: ADL,Balance,Mobility,Strength,UE functional use,FMC,Gait,Coordination,Decreased  knowledge of precautions,GMC,ROM,IADL,Dexterity,Decreased knowledge of use of DME Cognitive Skills: Attention,Memory,Problem Solve,Safety Awareness,Sequencing,Thought,Understand     Visit Diagnosis: Muscle weakness (generalized)  Other abnormalities of gait and mobility  Unsteadiness on feet  Visuospatial deficit  Attention and concentration deficit  Visual disturbance as complication of stroke  Other lack of coordination    Problem List Patient Active Problem List   Diagnosis Date Noted  . Eye pain 03/13/2020  . Hyperkalemia 01/14/2020  . Chronic RUQ pain 01/14/2020  . Sensation of fullness in ear, bilateral 01/11/2020  . Aphasia due to acute stroke ([facility]) 08/23/2019  . Hyperlipemia 08/23/2019  . ICH (intracerebral hemorrhage) ([facility]) L parietal w/ L SDH s/p crani 08/09/2019  . Subdural hematoma, acute ([facility]) 08/09/2019  . Healthcare maintenance 06/20/2019  . History of strabismus surgery 03/31/2019  . Moderate, recurrent MDD 06/06/2018  . 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 Osteoarthritis of right knee 06/05/2018  . Essential hypertension 06/05/2018  . Anxiety 06/05/2018  . Age-related nuclear cataract of both eyes 11/26/2014  . Diplopia 11/26/2014  . Monocular esotropia of left eye 11/26/2014  . Corneal transplant failure 10/27/2011    Jaimon Bugaj 04/23/2020, 4:09 PM  [facility] [address] [place], [place], [address] Phone: [phone]   Fax:  [phone]  Name: Michelle Carrillo MRN: [id number] Date of Birth: [birth year]

## 2020-04-24 ENCOUNTER — Telehealth: Payer: Self-pay

## 2020-04-24 ENCOUNTER — Encounter: Payer: Medicare Other | Admitting: Student

## 2020-04-24 DIAGNOSIS — E559 Vitamin D deficiency, unspecified: Secondary | ICD-10-CM | POA: Insufficient documentation

## 2020-04-24 NOTE — Assessment & Plan Note (Signed)
DEXA scan from prior visit pending. Patient given information regarding scheduling this.  PCV-13 administered during this visit. Patient advised to schedule appointment for COVID-19 booster. Rx for Tdap given.

## 2020-04-24 NOTE — Telephone Encounter (Signed)
  Follow up Call-  Call back number 04/22/2020  Post procedure Call Back phone  # [name] - daughter--[phone]  Permission to leave phone message Yes  Some recent data might be hidden     1st follow up call made.  NALM

## 2020-04-24 NOTE — Assessment & Plan Note (Signed)
This is improved. Patient is recovering well from her stroke. Speech is back to normal and patient has 5/5 strength in all extremities.   Plan: Continue atorvastatin 10mg  daily, amlodipine 10mg  daily, aspirin 81mg  daily, and lisinopril 20mg  daily

## 2020-04-24 NOTE — Progress Notes (Signed)
   CC: f/u vitamin d deficiency   HPI:  Michelle Carrillo is a 70 y.o. female with PMhx as listed below presenting for follow up of her vitamin D deficiency and other chronic medical issues. Please see problem based charting for complete assessment and plan.   Past Medical History:  Diagnosis Date  . Anxiety    on meds  . Arthritis    bilateral knees  . Cataract    RIGHT eye  . Depression    on meds  . Herpes zoster kerstitis s/p corneal transplant 2015   . Hypertension    on meds  . Right knee osteoarthritis   . Strabismus    due to eye injury as a child  . Stroke [facility]) 07/2019   stroke with craniotomy   Review of Systems:  Negative except as stated in HPI.  Physical Exam:  Vitals:   04/23/20 1409  BP: (!) 111/55  Pulse: 76  SpO2: 98%  Weight: 162 lb 12.8 oz (73.8 kg)   Physical Exam  Constitutional: Appears well-developed and well-nourished. No distress.  HENT: Normocephalic and atraumatic, EOMI, conjunctiva normal, moist mucous membranes Cardiovascular: Normal rate, regular rhythm, S1 and S2 present, no murmurs, rubs, gallops.  Distal pulses intact Respiratory: No respiratory distress, no accessory muscle use.  Effort is normal.  Lungs are clear to auscultation bilaterally. Musculoskeletal: Normal bulk and tone.  No peripheral edema noted. Neurological: Is alert and oriented x4, strength 5/5 in bilateral upper and lower extremities Skin: Warm and dry.  No rash, erythema, lesions noted.  Assessment & Plan:   See Encounters Tab for problem based charting.  Patient discussed with Dr. Dareen Piano

## 2020-04-24 NOTE — Assessment & Plan Note (Addendum)
Patient has history of osteoarthritis in right knee with plans for eventual total knee replacement with orthopedics once she completes physical therapy and regains strength from recent stroke. Most recent vitamin D levels of 26 for which patient is taking Vitamin D supplement - 5000U daily. She does not have history of pathologic fractures. DEXA scan ordered at last visit and patient given scheduling instructions at this time.   Plan; Continue vitamin D supplementation

## 2020-04-24 NOTE — Telephone Encounter (Signed)
  Follow up Call-  Call back number 04/22/2020  Post procedure Call Back phone  # [name] - daughter--[phone]  Permission to leave phone message Yes  Some recent data might be hidden     Patient questions:  Do you have a fever, pain , or abdominal swelling? No. Pain Score  0 *  Have you tolerated food without any problems? Yes.    Have you been able to return to your normal activities? Yes.    Do you have any questions about your discharge instructions: Diet   No. Medications  No. Follow up visit  No.  Do you have questions or concerns about your Care? No.  Actions: * If pain score is 4 or above: No action needed, pain <4. 1. Have you developed a fever since your procedure? no  2.   Have you had an respiratory symptoms (SOB or cough) since your procedure? no  3.   Have you tested positive for COVID 19 since your procedure no  4.   Have you had any family members/close contacts diagnosed with the COVID 19 since your procedure?  no   If yes to any of these questions please route to Joylene John, RN and Joella Prince, RN

## 2020-04-25 ENCOUNTER — Ambulatory Visit: Payer: Medicare Other

## 2020-04-25 ENCOUNTER — Ambulatory Visit: Payer: Medicare Other | Admitting: Occupational Therapy

## 2020-04-25 ENCOUNTER — Other Ambulatory Visit: Payer: Self-pay

## 2020-04-25 ENCOUNTER — Encounter: Payer: Self-pay | Admitting: Occupational Therapy

## 2020-04-25 DIAGNOSIS — I69398 Other sequelae of cerebral infarction: Secondary | ICD-10-CM | POA: Diagnosis not present

## 2020-04-25 DIAGNOSIS — R41842 Visuospatial deficit: Secondary | ICD-10-CM

## 2020-04-25 DIAGNOSIS — R41840 Attention and concentration deficit: Secondary | ICD-10-CM | POA: Diagnosis not present

## 2020-04-25 DIAGNOSIS — M6281 Muscle weakness (generalized): Secondary | ICD-10-CM | POA: Diagnosis not present

## 2020-04-25 DIAGNOSIS — R2681 Unsteadiness on feet: Secondary | ICD-10-CM

## 2020-04-25 DIAGNOSIS — R278 Other lack of coordination: Secondary | ICD-10-CM

## 2020-04-25 DIAGNOSIS — H539 Unspecified visual disturbance: Secondary | ICD-10-CM

## 2020-04-25 DIAGNOSIS — R2689 Other abnormalities of gait and mobility: Secondary | ICD-10-CM

## 2020-04-25 NOTE — Therapy (Signed)
[facility] [address] [place], [place], [address] Phone: [phone]   Fax:  [phone]  Physical Therapy Treatment  Patient Details  Name: Michelle Carrillo MRN: [id number] Date of Birth: [birth year] Referring Provider (PT): Referred by Joni Reining, DO. Followed by Frann Rider, NP   Encounter Date: 04/25/2020   PT End of Session - 04/25/20 1410    Visit Number 14    Number of Visits 16    Date for PT Re-Evaluation 06/07/20    Authorization Type Medicare (10th Visit PN)    Progress Note Due on Visit 10    PT Start Time 1320    PT Stop Time 1405    PT Time Calculation (min) 45 min    Equipment Utilized During Treatment Gait belt    Activity Tolerance Patient tolerated treatment well    Behavior During Therapy WFL for tasks assessed/performed           Past Medical History:  Diagnosis Date  . Anxiety    on meds  . Arthritis    bilateral knees  . Cataract    RIGHT eye  . Depression    on meds  . Herpes zoster kerstitis s/p corneal transplant 2015   . Hypertension    on meds  . Right knee osteoarthritis   . Strabismus    due to eye injury as a child  . Stroke [facility]) 07/2019   stroke with craniotomy    Past Surgical History:  Procedure Laterality Date  . CESAREAN SECTION      x 2   . CHOLECYSTECTOMY    . COLONOSCOPY  04/22/2020  . CORNEAL TRANSPLANT Left    2006 and 2016  . CRANIOTOMY Left 08/09/2019   Procedure: CRANIOTOMY HEMATOMA EVACUATION SUBDURAL;  Surgeon: Ashok Pall, MD;  Location: [place];  Service: Neurosurgery;  Laterality: Left;  left    There were no vitals filed for this visit.   Subjective Assessment - 04/25/20 1328    Subjective No new changes/falls since last visit, meds same, no falls, LOB or dizziness reported    Patient is accompained by: Interpreter;Family member   Wilhemena Durie, [place] Contracted Therapy   Pertinent History HTN, Anxiety, Depression, OA, Strabismus     Limitations Walking    Patient Stated Goals walk better    Currently in Pain? No/denies    Pain Onset 1 to 4 weeks ago                             [facility] Adult PT Treatment/Exercise - 04/25/20 0001      Ambulation/Gait   Ambulation/Gait Yes    Ambulation/Gait Assistance 5: Supervision;4: Min guard    Ambulation/Gait Assistance Details completed laps around track, both directions with PT providing TCs for increased cadence, added backwards walking and sidestepping 29ft x4 ea. activity.  Added challneg of head turns during task as well    Assistive device None      Neuro Re-ed    Neuro Re-ed Details  --      Exercises   Exercises Knee/Hip      Knee/Hip Exercises: Aerobic   Other Aerobic level 3 x7 min               Balance Exercises - 04/25/20 0001      Balance Exercises: Standing   Standing Eyes Opened Narrow base of support (BOS);Foam/compliant surface    Standing Eyes Opened Limitations --  perfromed OH reach with 2lb ball follwed by chops and trunk rotation all for 2x10, squats on airex while holding ball out front   Rockerboard Anterior/posterior   attempted functional activities with UEs but patient unable to comprehend task and reversion to airex improved tolerance              PT Short Term Goals - 04/08/20 1627      PT SHORT TERM GOAL #1   Title Patient will be independent with progres balance HEP and report compliance of at least 4x/week (All STGS Due: 04/22/20)    Baseline decreased compliance, completing 2-3x/weekly    Time 2    Period Weeks    Status Revised    Target Date 04/22/20             PT Long Term Goals - 04/08/20 1629      PT LONG TERM GOAL #1   Title Patient will be independent with final progressive HEP for strengthening/balance (ALL LTGS DUE: 05/13/20)    Baseline reports completing 3x/week    Time 5    Period Weeks    Status Revised    Target Date 05/13/20      PT LONG TERM GOAL #2   Title Patient will  improve TUG to </= 10 seconds without AD to demonstrate further reduced fall risk    Baseline 14.32 secs; 11.37 secs    Time 5    Period Weeks    Status Revised      PT LONG TERM GOAL #3   Title Patient will improve FGA to >/= 23/30 to demosntrate improved balance and reduced fall risk.    Baseline 19/30    Time 5    Period Weeks    Status New                 Plan - 04/25/20 1417    Clinical Impression Statement Patient performed high level balance activities with challenges to asses/improve balance and function, she cotinues to demonstrate cognitive issues whisc affect her ability to participate in multitasking events requiring demo as well as PT guidance through activties.  No LOB or dizziness reported and patient very appreciative of care from primary PTs as she notes good carryover of function when ouside of PT sessions    Personal Factors and Comorbidities Comorbidity 3+    Comorbidities HTN, Anxiety, Depression, OA, Strabismus    Examination-Activity Limitations Caring for Others;Locomotion Level;Stairs;Transfers    Examination-Participation Restrictions Cleaning;Community Activity;Medication Management    Stability/Clinical Decision Making Stable/Uncomplicated    Rehab Potential Good    PT Frequency 1x / week    PT Duration --   5 weeks   PT Treatment/Interventions ADLs/Self Care Home Management;DME Instruction;Stair training;Gait training;Functional mobility training;Therapeutic activities;Balance training;Neuromuscular re-education;Therapeutic exercise;Patient/family education;Orthotic Fit/Training;Manual techniques;Vestibular;Passive range of motion    PT Next Visit Plan continue high level balance. dynamic gait.  visual scanning. SciFit for Endurance. BLE strengthening. SLS activites    PT Home Exercise Plan Access Code: [email or web address]    Consulted and Agree with Plan of Care Family member/caregiver;Patient    Family Member Consulted Daughter           Patient will  benefit from skilled therapeutic intervention in order to improve the following deficits and impairments:  Abnormal gait,Decreased balance,Decreased mobility,Difficulty walking,Decreased endurance,Pain,Decreased strength,Decreased safety awareness,Decreased knowledge of use of DME,Decreased activity tolerance,Decreased range of motion,Impaired vision/preception  Visit Diagnosis: Muscle weakness (generalized)  Other abnormalities of gait and mobility  Unsteadiness on feet  Problem List Patient Active Problem List   Diagnosis Date Noted  . Vitamin D deficiency 04/24/2020  . Eye pain 03/13/2020  . Hyperkalemia 01/14/2020  . Chronic RUQ pain 01/14/2020  . Sensation of fullness in ear, bilateral 01/11/2020  . Aphasia due to acute stroke ([place]) 08/23/2019  . Hyperlipemia 08/23/2019  . ICH (intracerebral hemorrhage) (HCC) L parietal w/ L SDH s/p crani 08/09/2019  . Subdural hematoma, acute ([place]) 08/09/2019  . Healthcare maintenance 06/20/2019  . History of strabismus surgery 03/31/2019  . Moderate, recurrent MDD 06/06/2018  . 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 Osteoarthritis of right knee 06/05/2018  . Essential hypertension 06/05/2018  . Anxiety 06/05/2018  . Age-related nuclear cataract of both eyes 11/26/2014  . Diplopia 11/26/2014  . Monocular esotropia of left eye 11/26/2014  . Corneal transplant failure 10/27/2011    Lanice Shirts  PT 04/25/2020, 2:42 PM  [facility] [address] [place], [place], [address] Phone: [phone]   Fax:  [phone]  Name: MESHELL ABDULAZIZ MRN: [id number] Date of Birth: 09-18-1950

## 2020-04-25 NOTE — Therapy (Signed)
[facility] [address] [place], [place], [address] Phone: [phone]   Fax:  [phone]  Occupational Therapy Treatment  Patient Details  Name: Michelle Carrillo MRN: [id number] Date of Birth: 1950-08-12 Referring Provider (OT): Frann Rider, NP   Encounter Date: 04/25/2020   OT End of Session - 04/25/20 1433    Visit Number 7    Number of Visits 17    Date for OT Re-Evaluation 05/21/20    Authorization Type Medicare    Authorization - Visit Number 7    Authorization - Number of Visits 10    OT Start Time 6789    OT Stop Time 1315    OT Time Calculation (min) 40 min    Activity Tolerance Patient tolerated treatment well    Behavior During Therapy [facility] for tasks assessed/performed           Past Medical History:  Diagnosis Date  . Anxiety    on meds  . Arthritis    bilateral knees  . Cataract    RIGHT eye  . Depression    on meds  . Herpes zoster kerstitis s/p corneal transplant 2015   . Hypertension    on meds  . Right knee osteoarthritis   . Strabismus    due to eye injury as a child  . Stroke [facility]) 07/2019   stroke with craniotomy    Past Surgical History:  Procedure Laterality Date  . CESAREAN SECTION      x 2   . CHOLECYSTECTOMY    . COLONOSCOPY  04/22/2020  . CORNEAL TRANSPLANT Left    2006 and 2016  . CRANIOTOMY Left 08/09/2019   Procedure: CRANIOTOMY HEMATOMA EVACUATION SUBDURAL;  Surgeon: Ashok Pall, MD;  Location: [facility];  Service: Neurosurgery;  Laterality: Left;  left    There were no vitals filed for this visit.   Subjective Assessment - 04/25/20 1433    Subjective  Denies pain    Pertinent History PMH: HTN, Anxiety, Depression, OA, Strabismus   Presented to ED on 08/09/19 with altered mental status. CT scan of the head showed a large moderate size cortically based hematoma in the left parietal region with extension into the subdural space resulting in a fair sized  subdural hematoma with left-to-right midline shift. Patient recieved inpatient rehab services and was discharged home    Patient Stated Goals I want to get better and better    Currently in Pain? No/denies                 Treatment: Basic cooking task to fry and egg, min v.c to locate items with increased time. Pt performed task with min questioning cues. Pt accidentally turned on wrong burner. Pt/ dtr attribute this to unfamiliar stove.  Pt fried an egg, min difficulty with flipping egg due to decreased dexterity with RUE. Pt remembered to turn off stove independently. Therapist discussed safety with pt/ dtr. Pt's dtr reports pt has been performing at home in a familiar environment independently Flipping and dealing cards with RUE for increased fine motor coordination, min difficulty/ v.c              OT Short Term Goals - 04/25/20 1434      OT SHORT TERM GOAL #1   Title I with inital HEP.    Time 4    Status On-going    Target Date 04/23/20      OT SHORT TERM GOAL #2   Title Pt/  caregiever will verbalize understanding of compensatory strategies for visual deficits.    Time 4    Period Weeks    Status Achieved   Discussed strategies with pt/ dtr.     OT SHORT TERM GOAL #3   Title Pt will demonstrate ability to sequence a simple functional task/ home management task with no more than min v.c    Time 4    Period Weeks    Status On-going      OT SHORT TERM GOAL #4   Title Pt will perform environmental scanning with 80% or better accuracy in a min distracting envireonment.    Time 4    Period Weeks    Status On-going      OT SHORT TERM GOAL #5   Title Pt will complete table top scanning tasks with 80% accuracy or better    Time 4    Period Weeks    Status On-going             OT Long Term Goals - 04/25/20 1434      OT LONG TERM GOAL #1   Title Pt will complete a simple cooking task including locating items in a resonable amount of time modified  independently.    Time 8    Status On-going   min v.c for unfamiliar environment     OT LONG TERM GOAL #2   Title Pt. will demonstrate improved bilateral UE coordination as evidenced by decreasing 9 hole peg test score by 5 secs.    Time 8    Period Weeks    Status On-going      OT LONG TERM GOAL #3   Title Pt will complete tabletop scanning activities with 90% or better accuracy.    Time 8    Period Weeks    Status On-going      OT LONG TERM GOAL #4   Title Pt will locate items in a mod distracting environment with 90% or better accuracy.    Time 8    Period Weeks    Status On-going                  Patient will benefit from skilled therapeutic intervention in order to improve the following deficits and impairments:           Visit Diagnosis: Muscle weakness (generalized)  Other abnormalities of gait and mobility  Unsteadiness on feet  Visuospatial deficit  Attention and concentration deficit  Visual disturbance as complication of stroke  Other lack of coordination    Problem List Patient Active Problem List   Diagnosis Date Noted  . Vitamin D deficiency 04/24/2020  . Eye pain 03/13/2020  . Hyperkalemia 01/14/2020  . Chronic RUQ pain 01/14/2020  . Sensation of fullness in ear, bilateral 01/11/2020  . Aphasia due to acute stroke ([place]) 08/23/2019  . Hyperlipemia 08/23/2019  . ICH (intracerebral hemorrhage) (HCC) L parietal w/ L SDH s/p crani 08/09/2019  . Subdural hematoma, acute ([place]) 08/09/2019  . Healthcare maintenance 06/20/2019  . History of strabismus surgery 03/31/2019  . Moderate, recurrent MDD 06/06/2018  . 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 Osteoarthritis of right knee 06/05/2018  . Essential hypertension 06/05/2018  . Anxiety 06/05/2018  . Age-related nuclear cataract of both eyes 11/26/2014  . Diplopia 11/26/2014  . Monocular esotropia of left eye 11/26/2014  . Corneal transplant  failure 10/27/2011    Lekeshia Kram 04/25/2020, 2:35 PM  [facility]  [place], [place], [address] Phone: [phone]   Fax:  [phone]  Name: KJIRSTEN BLOODGOOD MRN: [id number] Date of Birth: 11-Jun-1950

## 2020-04-25 NOTE — Progress Notes (Signed)
Internal Medicine Clinic Attending ° °Case discussed with Dr. Aslam  At the time of the visit.  We reviewed the resident’s history and exam and pertinent patient test results.  I agree with the assessment, diagnosis, and plan of care documented in the resident’s note.  °

## 2020-04-28 ENCOUNTER — Encounter: Payer: Self-pay | Admitting: Gastroenterology

## 2020-04-30 ENCOUNTER — Ambulatory Visit: Payer: Medicare Other | Admitting: Physical Therapy

## 2020-04-30 ENCOUNTER — Other Ambulatory Visit: Payer: Self-pay

## 2020-04-30 ENCOUNTER — Encounter: Payer: Self-pay | Admitting: Physical Therapy

## 2020-04-30 ENCOUNTER — Ambulatory Visit: Payer: Medicare Other | Admitting: Occupational Therapy

## 2020-04-30 ENCOUNTER — Ambulatory Visit: Payer: Medicare Other

## 2020-04-30 DIAGNOSIS — R41842 Visuospatial deficit: Secondary | ICD-10-CM | POA: Diagnosis not present

## 2020-04-30 DIAGNOSIS — R2681 Unsteadiness on feet: Secondary | ICD-10-CM

## 2020-04-30 DIAGNOSIS — H539 Unspecified visual disturbance: Secondary | ICD-10-CM | POA: Diagnosis not present

## 2020-04-30 DIAGNOSIS — R41840 Attention and concentration deficit: Secondary | ICD-10-CM

## 2020-04-30 DIAGNOSIS — R278 Other lack of coordination: Secondary | ICD-10-CM | POA: Diagnosis not present

## 2020-04-30 DIAGNOSIS — M6281 Muscle weakness (generalized): Secondary | ICD-10-CM | POA: Diagnosis not present

## 2020-04-30 DIAGNOSIS — R2689 Other abnormalities of gait and mobility: Secondary | ICD-10-CM

## 2020-04-30 DIAGNOSIS — I69398 Other sequelae of cerebral infarction: Secondary | ICD-10-CM | POA: Diagnosis not present

## 2020-04-30 NOTE — Therapy (Signed)
[facility] [address] [place], [place], [address] Phone: [phone]   Fax:  [phone]  Occupational Therapy Treatment  Patient Details  Name: Michelle Carrillo MRN: [id number] Date of Birth: 03/26/1951 Referring Provider (OT): Frann Rider, NP   Encounter Date: 04/30/2020   OT End of Session - 04/30/20 1413    Visit Number 8    Number of Visits 17    Date for OT Re-Evaluation 05/21/20    Authorization Type Medicare    Authorization - Visit Number 8    Authorization - Number of Visits 10    OT Start Time 1409    OT Stop Time 1445    OT Time Calculation (min) 36 min    Activity Tolerance Patient tolerated treatment well    Behavior During Therapy [facility] for tasks assessed/performed           Past Medical History:  Diagnosis Date  . Anxiety    on meds  . Arthritis    bilateral knees  . Cataract    RIGHT eye  . Depression    on meds  . Herpes zoster kerstitis s/p corneal transplant 2015   . Hypertension    on meds  . Right knee osteoarthritis   . Strabismus    due to eye injury as a child  . Stroke [facility]) 07/2019   stroke with craniotomy    Past Surgical History:  Procedure Laterality Date  . CESAREAN SECTION      x 2   . CHOLECYSTECTOMY    . COLONOSCOPY  04/22/2020  . CORNEAL TRANSPLANT Left    2006 and 2016  . CRANIOTOMY Left 08/09/2019   Procedure: CRANIOTOMY HEMATOMA EVACUATION SUBDURAL;  Surgeon: Ashok Pall, MD;  Location: [facility];  Service: Neurosurgery;  Laterality: Left;  left    There were no vitals filed for this visit.   Subjective Assessment - 04/30/20 1416    Subjective  Denies pain    Pertinent History PMH: HTN, Anxiety, Depression, OA, Strabismus   Presented to ED on 08/09/19 with altered mental status. CT scan of the head showed a large moderate size cortically based hematoma in the left parietal region with extension into the subdural space resulting in a fair sized  subdural hematoma with left-to-right midline shift. Patient recieved inpatient rehab services and was discharged home    Patient Stated Goals I want to get better and better    Currently in Pain? No/denies                     Treatment: 37M number cancellation task, increased time and mod v.c for performance. Pt completed with a line guide and 4/5 correct 80% Flower patch garden patch design copying task, max difficulty/ v.c for visual perceptual and cognitive skills               OT Short Term Goals - 04/30/20 1410      OT SHORT TERM GOAL #1   Title I with inital HEP.    Time 4    Status On-going    Target Date 04/23/20      OT SHORT TERM GOAL #2   Title Pt/ caregiever will verbalize understanding of compensatory strategies for visual deficits.    Time 4    Period Weeks    Status Achieved   Discussed strategies with pt/ dtr.     OT SHORT TERM GOAL #3   Title Pt will demonstrate ability to sequence  a simple functional task/ home management task with no more than min v.c    Time 4    Period Weeks    Status On-going      OT SHORT TERM GOAL #4   Title Pt will perform environmental scanning with 80% or better accuracy in a min distracting envireonment.    Time 4    Period Weeks    Status On-going      OT SHORT TERM GOAL #5   Title Pt will complete table top scanning tasks with 80% accuracy or better    Time 4    Period Weeks    Status On-going             OT Long Term Goals - 04/25/20 1434      OT LONG TERM GOAL #1   Title Pt will complete a simple cooking task including locating items in a resonable amount of time modified independently.    Time 8    Status On-going   min v.c for unfamiliar environment     OT LONG TERM GOAL #2   Title Pt. will demonstrate improved bilateral UE coordination as evidenced by decreasing 9 hole peg test score by 5 secs.    Time 8    Period Weeks    Status On-going      OT LONG TERM GOAL #3   Title Pt will  complete tabletop scanning activities with 90% or better accuracy.    Time 8    Period Weeks    Status On-going      OT LONG TERM GOAL #4   Title Pt will locate items in a mod distracting environment with 90% or better accuracy.    Time 8    Period Weeks    Status On-going                  Patient will benefit from skilled therapeutic intervention in order to improve the following deficits and impairments:           Visit Diagnosis: Muscle weakness (generalized)  Visuospatial deficit  Attention and concentration deficit  Visual disturbance as complication of stroke    Problem List Patient Active Problem List   Diagnosis Date Noted  . Vitamin D deficiency 04/24/2020  . Eye pain 03/13/2020  . Hyperkalemia 01/14/2020  . Chronic RUQ pain 01/14/2020  . Sensation of fullness in ear, bilateral 01/11/2020  . Aphasia due to acute stroke ([place]) 08/23/2019  . Hyperlipemia 08/23/2019  . ICH (intracerebral hemorrhage) (HCC) L parietal w/ L SDH s/p crani 08/09/2019  . Subdural hematoma, acute ([place]) 08/09/2019  . Healthcare maintenance 06/20/2019  . History of strabismus surgery 03/31/2019  . Moderate, recurrent MDD 06/06/2018  . 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 Osteoarthritis of right knee 06/05/2018  . Essential hypertension 06/05/2018  . Anxiety 06/05/2018  . Age-related nuclear cataract of both eyes 11/26/2014  . Diplopia 11/26/2014  . Monocular esotropia of left eye 11/26/2014  . Corneal transplant failure 10/27/2011    Ahna Konkle 04/30/2020, 2:20 PM  [facility] [address] [place], [place], [address] Phone: [phone]   Fax:  [phone]  Name: Michelle Carrillo MRN: [id number] Date of Birth: Oct 20, 1950

## 2020-05-01 NOTE — Therapy (Signed)
[facility] [address] [place], [place], [address] Phone: [phone]   Fax:  [phone]  Physical Therapy Treatment  Patient Details  Name: Michelle Carrillo MRN: [id number] Date of Birth: 05-24-1950 Referring Provider (PT): Referred by Joni Reining, DO. Followed by Frann Rider, NP   Encounter Date: 04/30/2020   PT End of Session - 04/30/20 1451    Visit Number 15    Number of Visits 16    Date for PT Re-Evaluation 06/07/20    Authorization Type Medicare (10th Visit PN)    Progress Note Due on Visit 20    PT Start Time 1447    PT Stop Time 1530    PT Time Calculation (min) 43 min    Equipment Utilized During Treatment Gait belt    Activity Tolerance Patient tolerated treatment well    Behavior During Therapy WFL for tasks assessed/performed           Past Medical History:  Diagnosis Date  . Anxiety    on meds  . Arthritis    bilateral knees  . Cataract    RIGHT eye  . Depression    on meds  . Herpes zoster kerstitis s/p corneal transplant 2015   . Hypertension    on meds  . Right knee osteoarthritis   . Strabismus    due to eye injury as a child  . Stroke [facility]) 07/2019   stroke with craniotomy    Past Surgical History:  Procedure Laterality Date  . CESAREAN SECTION      x 2   . CHOLECYSTECTOMY    . COLONOSCOPY  04/22/2020  . CORNEAL TRANSPLANT Left    2006 and 2016  . CRANIOTOMY Left 08/09/2019   Procedure: CRANIOTOMY HEMATOMA EVACUATION SUBDURAL;  Surgeon: Ashok Pall, MD;  Location: [facility];  Service: Neurosurgery;  Laterality: Left;  left    There were no vitals filed for this visit.   Subjective Assessment - 04/30/20 1450    Subjective No new complaints. No falls or pain to report. Has not recieved her foam pad for home as of yet.    Patient is accompained by: Interpreter    Pertinent History HTN, Anxiety, Depression, OA, Strabismus    Limitations Walking    Patient Stated  Goals walk better    Currently in Pain? No/denies    Pain Score 0-No pain                [facility] Adult PT Treatment/Exercise - 04/30/20 1451      Transfers   Transfers Sit to Stand;Stand to Sit    Sit to Stand 6: Modified independent (Device/Increase time)    Stand to Sit 6: Modified independent (Device/Increase time)      Ambulation/Gait   Ambulation/Gait Yes    Ambulation/Gait Assistance 5: Supervision    Ambulation/Gait Assistance Details around the gym with session    Assistive device None    Gait Pattern Within Functional Limits    Ambulation Surface Level;Indoor      Knee/Hip Exercises: Aerobic   Other Aerobic Scifit level 3.0 with UE/LE's for 8 minutes with goal >/=60 rpm for stengthening and activity tolerance.               Balance Exercises - 04/30/20 1459      Balance Exercises: Standing   SLS with Vectors Foam/compliant surface;Other reps (comment);Intermittent upper extremity assist;Limitations    SLS with Vectors Limitations on airex with 2 tall cones in front:  alternating forward foot taps, cross foot taps, forward double foot taps, cross double foot taps for ~10 reps each with no UE support, occasional touch to sturdy surface with min guard to min assist. cues to slow down, maintain wider base of support for improved balance.    Rockerboard Anterior/posterior;Lateral;Head turns;EO;EC;30 seconds;10 reps;Intermittent UE support;Limitations    Rockerboard Limitations performed both ways on balance board with no UE support, occasionall touch to sturdy surdace as needed- rocking the board with EO, progressing to EC. then holding the board steady for EC 30 sec's x 3 reps, progressing to EC head movements left<>right, up<>down for ~10 reps each. min guard to min assist for balance. cues on posture/weight shifitng to assist with balance.    Partial Tandem Stance Eyes closed;Foam/compliant surface;2 reps;30 secs;Limitations    Partial Tandem Stance Limitations on  airex with no UE support: performed 2 reps with each foot forward with cues on technique, posture and weight shifitng to assist with balance.               PT Short Term Goals - 04/08/20 1627      PT SHORT TERM GOAL #1   Title Patient will be independent with progres balance HEP and report compliance of at least 4x/week (All STGS Due: 04/22/20)    Baseline decreased compliance, completing 2-3x/weekly    Time 2    Period Weeks    Status Revised    Target Date 04/22/20             PT Long Term Goals - 04/08/20 1629      PT LONG TERM GOAL #1   Title Patient will be independent with final progressive HEP for strengthening/balance (ALL LTGS DUE: 05/13/20)    Baseline reports completing 3x/week    Time 5    Period Weeks    Status Revised    Target Date 05/13/20      PT LONG TERM GOAL #2   Title Patient will improve TUG to </= 10 seconds without AD to demonstrate further reduced fall risk    Baseline 14.32 secs; 11.37 secs    Time 5    Period Weeks    Status Revised      PT LONG TERM GOAL #3   Title Patient will improve FGA to >/= 23/30 to demosntrate improved balance and reduced fall risk.    Baseline 19/30    Time 5    Period Weeks    Status New                 Plan - 04/30/20 1451    Clinical Impression Statement Today's skilled session continued to focus on activity tolerance, strengthening and balance training. Multmodal cues needed due to language barrier along with her cognitive issues. The pt is progressing well toward goals and should benefit from continued PT to progress toward unmet goals.    Personal Factors and Comorbidities Comorbidity 3+    Comorbidities HTN, Anxiety, Depression, OA, Strabismus    Examination-Activity Limitations Caring for Others;Locomotion Level;Stairs;Transfers    Examination-Participation Restrictions Cleaning;Community Activity;Medication Management    Stability/Clinical Decision Making Stable/Uncomplicated    Rehab Potential  Good    PT Frequency 1x / week    PT Duration --   5 weeks   PT Treatment/Interventions ADLs/Self Care Home Management;DME Instruction;Stair training;Gait training;Functional mobility training;Therapeutic activities;Balance training;Neuromuscular re-education;Therapeutic exercise;Patient/family education;Orthotic Fit/Training;Manual techniques;Vestibular;Passive range of motion    PT Next Visit Plan ? discharge as not more PT appts set; continue high  level balance. dynamic gait.  visual scanning. SciFit for Endurance. BLE strengthening. SLS activites    PT Home Exercise Plan Access Code: [email or web address]    Consulted and Agree with Plan of Care Family member/caregiver;Patient    Family Member Consulted Daughter           Patient will benefit from skilled therapeutic intervention in order to improve the following deficits and impairments:  Abnormal gait,Decreased balance,Decreased mobility,Difficulty walking,Decreased endurance,Pain,Decreased strength,Decreased safety awareness,Decreased knowledge of use of DME,Decreased activity tolerance,Decreased range of motion,Impaired vision/preception  Visit Diagnosis: Other abnormalities of gait and mobility  Unsteadiness on feet     Problem List Patient Active Problem List   Diagnosis Date Noted  . Vitamin D deficiency 04/24/2020  . Eye pain 03/13/2020  . Hyperkalemia 01/14/2020  . Chronic RUQ pain 01/14/2020  . Sensation of fullness in ear, bilateral 01/11/2020  . Aphasia due to acute stroke ([place]) 08/23/2019  . Hyperlipemia 08/23/2019  . ICH (intracerebral hemorrhage) (HCC) L parietal w/ L SDH s/p crani 08/09/2019  . Subdural hematoma, acute ([place]) 08/09/2019  . Healthcare maintenance 06/20/2019  . History of strabismus surgery 03/31/2019  . Moderate, recurrent MDD 06/06/2018  . 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 Osteoarthritis of right knee 06/05/2018  . Essential hypertension 06/05/2018  . Anxiety  06/05/2018  . Age-related nuclear cataract of both eyes 11/26/2014  . Diplopia 11/26/2014  . Monocular esotropia of left eye 11/26/2014  . Corneal transplant failure 10/27/2011    Willow Ora, PTA, [facility] [address], [place], [place] [address] [phone] 05/01/20, 9:09 AM   Name: Michelle Carrillo MRN: [id number] Date of Birth: Sep 11, 1950

## 2020-05-02 ENCOUNTER — Ambulatory Visit: Payer: Medicare Other

## 2020-05-02 ENCOUNTER — Other Ambulatory Visit: Payer: Self-pay

## 2020-05-02 ENCOUNTER — Encounter: Payer: Self-pay | Admitting: Speech Pathology

## 2020-05-02 ENCOUNTER — Ambulatory Visit: Payer: Medicare Other | Admitting: Speech Pathology

## 2020-05-02 ENCOUNTER — Ambulatory Visit: Payer: Medicare Other | Admitting: Occupational Therapy

## 2020-05-02 DIAGNOSIS — R278 Other lack of coordination: Secondary | ICD-10-CM

## 2020-05-02 DIAGNOSIS — R41840 Attention and concentration deficit: Secondary | ICD-10-CM

## 2020-05-02 DIAGNOSIS — R41842 Visuospatial deficit: Secondary | ICD-10-CM

## 2020-05-02 DIAGNOSIS — H539 Unspecified visual disturbance: Secondary | ICD-10-CM | POA: Diagnosis not present

## 2020-05-02 DIAGNOSIS — M6281 Muscle weakness (generalized): Secondary | ICD-10-CM

## 2020-05-02 DIAGNOSIS — I69398 Other sequelae of cerebral infarction: Secondary | ICD-10-CM | POA: Diagnosis not present

## 2020-05-02 DIAGNOSIS — R41841 Cognitive communication deficit: Secondary | ICD-10-CM

## 2020-05-02 NOTE — Therapy (Addendum)
[facility] [address] [place], [place], [address] Phone: [phone]   Fax:  [phone]  Occupational Therapy Treatment  Patient Details  Name: Michelle Carrillo MRN: [id number] Date of Birth: [birth year] Referring Provider (OT): Frann Rider, NP   Encounter Date: 05/02/2020   OT End of Session - 05/02/20 1243    Number of Visits 17    Date for OT Re-Evaluation 05/21/20    Authorization Type Medicare    Authorization - Visit Number 8    Authorization - Number of Visits 10    Activity Tolerance Patient tolerated treatment well    Behavior During Therapy [facility] for tasks assessed/performed           Past Medical History:  Diagnosis Date  . Anxiety    on meds  . Arthritis    bilateral knees  . Cataract    RIGHT eye  . Depression    on meds  . Herpes zoster kerstitis s/p corneal transplant 2015   . Hypertension    on meds  . Right knee osteoarthritis   . Strabismus    due to eye injury as a child  . Stroke [facility]) 07/2019   stroke with craniotomy    Past Surgical History:  Procedure Laterality Date  . CESAREAN SECTION      x 2   . CHOLECYSTECTOMY    . COLONOSCOPY  04/22/2020  . CORNEAL TRANSPLANT Left    2006 and 2016  . CRANIOTOMY Left 08/09/2019   Procedure: CRANIOTOMY HEMATOMA EVACUATION SUBDURAL;  Surgeon: Ashok Pall, MD;  Location: [facility];  Service: Neurosurgery;  Laterality: Left;  left    There were no vitals filed for this visit.   Subjective Assessment - 05/02/20 1247    Subjective  Denies pain    Pertinent History PMH: HTN, Anxiety, Depression, OA, Strabismus   Presented to ED on 08/09/19 with altered mental status. CT scan of the head showed a large moderate size cortically based hematoma in the left parietal region with extension into the subdural space resulting in a fair sized subdural hematoma with left-to-right midline shift. Patient recieved inpatient rehab services and was  discharged home    Patient Stated Goals I want to get better and better    Currently in Pain? No/denies                      Treatment: copying small peg design for fine motor coordination, visual perceptual skills and problem solving, mod v.c yet improved performance compared to last time pt performed this task. Environmental scanning basic in min distracting environment 100% 12 piece puzzle mod v.c yet improving problem solving, to address vision and problem solving.            OT Short Term Goals - 05/02/20 1252      OT SHORT TERM GOAL #1   Title I with inital HEP.    Time 4    Status On-going   need to add to HEP   Target Date 04/23/20      OT SHORT TERM GOAL #2   Title Pt/ caregiever will verbalize understanding of compensatory strategies for visual deficits.    Time 4    Period Weeks    Status Achieved   Discussed strategies with pt/ dtr.     OT SHORT TERM GOAL #3   Title Pt will demonstrate ability to sequence a simple functional task/ home management task with no more than min  v.c    Time 4    Period Weeks    Status Achieved   met per pt family for simple cooking at home     OT SHORT TERM GOAL #4   Title Pt will perform environmental scanning with 80% or better accuracy in a min distracting envireonment.    Time 4    Period Weeks    Status Achieved   100% today     OT SHORT TERM GOAL #5   Title Pt will complete table top scanning tasks with 80% accuracy or better    Time 4    Period Weeks    Status On-going   not consistent            OT Long Term Goals - 04/25/20 1434      OT LONG TERM GOAL #1   Title Pt will complete a simple cooking task including locating items in a resonable amount of time modified independently.    Time 8    Status On-going   min v.c for unfamiliar environment     OT LONG TERM GOAL #2   Title Pt. will demonstrate improved bilateral UE coordination as evidenced by decreasing 9 hole peg test score by 5 secs.     Time 8    Period Weeks    Status On-going      OT LONG TERM GOAL #3   Title Pt will complete tabletop scanning activities with 90% or better accuracy.    Time 8    Period Weeks    Status On-going      OT LONG TERM GOAL #4   Title Pt will locate items in a mod distracting environment with 90% or better accuracy.    Time 8    Period Weeks    Status On-going                Plan - 05/06/20 0740    Clinical Impression Statement Pt is progressing towards goals. She demonstrates improvement with visual percptual and thinking skills.    OT Occupational Profile and History Detailed Assessment- Review of Records and additional review of physical, cognitive, psychosocial history related to current functional performance    Occupational performance deficits (Please refer to evaluation for details): ADL's;IADL's;Leisure;Social Participation    Body Structure / Function / Physical Skills ADL;Balance;Mobility;Strength;UE functional use;FMC;Gait;Coordination;Decreased knowledge of precautions;GMC;ROM;IADL;Dexterity;Decreased knowledge of use of DME    Cognitive Skills Attention;Memory;Problem Solve;Safety Awareness;Sequencing;Thought;Understand    Rehab Potential Good    OT Frequency 2x / week    OT Duration 8 weeks    OT Treatment/Interventions Self-care/ADL training;Therapeutic exercise;Balance training;Aquatic Therapy;Ultrasound;Neuromuscular education;Manual Therapy;Splinting;Therapeutic activities;Cryotherapy;Paraffin;DME and/or AE instruction;Cognitive remediation/compensation;Visual/perceptual remediation/compensation;Fluidtherapy;Gait Training;Moist Heat;Contrast Bath;Passive range of motion;Patient/family education    Plan check goals, visual perceptual and simple functional tasks    Consulted and Agree with Plan of Care Patient;Family member/caregiver    Family Member Consulted --            Patient will benefit from skilled therapeutic intervention in order to improve the  following deficits and impairments: visual perceptual and cognitive skills, coordination         Visit Diagnosis: Muscle weakness (generalized)  Visuospatial deficit  Attention and concentration deficit  Visual disturbance as complication of stroke  Other lack of coordination    Problem List Patient Active Problem List   Diagnosis Date Noted  . Vitamin D deficiency 04/24/2020  . Eye pain 03/13/2020  . Hyperkalemia 01/14/2020  . Chronic RUQ pain 01/14/2020  . Sensation  of fullness in ear, bilateral 01/11/2020  . Aphasia due to acute stroke ([place]) 08/23/2019  . Hyperlipemia 08/23/2019  . ICH (intracerebral hemorrhage) (HCC) L parietal w/ L SDH s/p crani 08/09/2019  . Subdural hematoma, acute ([place]) 08/09/2019  . Healthcare maintenance 06/20/2019  . History of strabismus surgery 03/31/2019  . Moderate, recurrent MDD 06/06/2018  . 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 Osteoarthritis of right knee 06/05/2018  . Essential hypertension 06/05/2018  . Anxiety 06/05/2018  . Age-related nuclear cataract of both eyes 11/26/2014  . Diplopia 11/26/2014  . Monocular esotropia of left eye 11/26/2014  . Corneal transplant failure 10/27/2011    Ashliegh Parekh 05/02/2020, 1:31 PM Theone Murdoch, OTR/L [phone] Phone: [phone] 7:42 AM 05/06/20 [facility] [address] [place], [place], [address] Phone: [phone]   Fax:  [phone]  Name: JOSALYNN JOHNDROW MRN: [id number] Date of Birth: 09/21/1950

## 2020-05-02 NOTE — Therapy (Signed)
[facility] [address] [place], [place], [address] Phone: [phone]   Fax:  [phone]  Speech Language Pathology Treatment  Patient Details  Name: Michelle Carrillo MRN: [id number] Date of Birth: [birth year] Referring Provider (SLP): Frann Rider, NP   Encounter Date: 05/02/2020   End of Session - 05/02/20 1415    Visit Number 13    Number of Visits 17    Date for SLP Re-Evaluation 05/16/20   extended as Michelle Carrillo missed 2 weeks of ST   SLP Start Time 7062    SLP Stop Time  3762    SLP Time Calculation (min) 48 min    Activity Tolerance Patient tolerated treatment well           Past Medical History:  Diagnosis Date  . Anxiety    on meds  . Arthritis    bilateral knees  . Cataract    RIGHT eye  . Depression    on meds  . Herpes zoster kerstitis s/p corneal transplant 2015   . Hypertension    on meds  . Right knee osteoarthritis   . Strabismus    due to eye injury as a child  . Stroke [facility]) 07/2019   stroke with craniotomy    Past Surgical History:  Procedure Laterality Date  . CESAREAN SECTION      x 2   . CHOLECYSTECTOMY    . COLONOSCOPY  04/22/2020  . CORNEAL TRANSPLANT Left    2006 and 2016  . CRANIOTOMY Left 08/09/2019   Procedure: CRANIOTOMY HEMATOMA EVACUATION SUBDURAL;  Surgeon: Ashok Pall, MD;  Location: [facility];  Service: Neurosurgery;  Laterality: Left;  left    There were no vitals filed for this visit.   Subjective Assessment - 05/02/20 1318    Subjective "She has not had ST for 2 weeks"    Patient is accompained by: Family member   Michelle Carrillo   Currently in Pain? No/denies                 ADULT SLP TREATMENT - 05/02/20 1319      General Information   Behavior/Cognition Alert;Cooperative;Pleasant mood      Treatment Provided   Treatment provided Cognitive-Linquistic      Cognitive-Linquistic Treatment   Treatment focused on Cognition;Patient/family/caregiver  education    Skilled Treatment Michelle Carrillo verbalizes what she and ST Glendell Docker) worked on last session - she has not used lists, but has initiated Research officer, political party putting most used items in the front. She reports success with laundry and dish washing with success. She reports forgetting indgredient last evening, and is slower trying to find items. We generated strategy of getting all items and ingredients out and prepped prior to beginning to cook, and scan the kitchen counters using head scan to make sure all items were included. Meal planning is also difficult. We discussed having family assist with a weekly meal plan to be on rotation to reduce cognitive burden and provide routine for meals. Lincoln Brigham agree that word finding has mostly resolved. Michelle Carrillo rates her speech and word finding at 90% back to normal.      Assessment / Recommendations / Plan   Plan Continue with current plan of care      Progression Toward Goals   Progression toward goals Progressing toward goals            SLP Education - 05/02/20 1412    Education Details compensationsn for attention, memory and vision with  meal prep and planning    Person(s) Educated Patient;Child(ren)    Methods Explanation;Demonstration;Verbal cues;Handout    Comprehension Verbalized understanding;Verbal cues required;Need further instruction            SLP Short Term Goals - 05/02/20 1413      SLP SHORT TERM GOAL #1   Title Pt wil recall and request am meds with occasional min A from caregivers over 3 sessions    Status Achieved      SLP SHORT TERM GOAL #2   Title Pt and family will carryover 3 compensations for auditory comprehension so pt can accurately follow directions and understand daily plan    Status Partially Met      SLP SHORT TERM GOAL #3   Title Pt will be oriented to day and time as measured by her verbalizing times she needs to get dressed and leave for her appointments with compensatory strategies and cues from family     Baseline 03/17/20; 03/19/20    Status Achieved      SLP SHORT TERM GOAL #4   Title Complete CLQT    Status Deferred            SLP Long Term Goals - 05/02/20 1413      SLP LONG TERM GOAL #1   Title Pt will use external aids to be oriented to date, time and daily plan with rare min A from family    Status Achieved   list of appointmetns and dates     SLP LONG TERM GOAL #2   Title Pt will get ready for appointments and daily tasks in timely manner with rare min A from family    Time 2    Period Weeks    Status On-going      SLP LONG TERM GOAL #3   Title Pt will ID and correct language errors in converation with occasional min A from family over 2 sessions    Time 2    Period Weeks    Status Achieved            Plan - 05/02/20 1413    Clinical Impression Statement Michelle Carrillo is referred for outpt ST s/p SDH/craniotomy due to ongoing cognitive communication impairments.  See 'skilled intervention" for further details on today's session.  I recommend cont'd skilled ST to maximize cognitive linguistic skills for safety, QOL and to reduce caregiver burden    Speech Therapy Frequency 2x / week    Duration 8 weeks   17 visits   Treatment/Interventions SLP instruction and feedback;Compensatory strategies;Functional tasks;Cognitive reorganization;Compensatory techniques;Cueing hierarchy;Environmental controls;Patient/family education;Multimodal communcation approach;Internal/external aids    Potential to Achieve Goals Good           Patient will benefit from skilled therapeutic intervention in order to improve the following deficits and impairments:   Cognitive communication deficit    Problem List Patient Active Problem List   Diagnosis Date Noted  . Vitamin D deficiency 04/24/2020  . Eye pain 03/13/2020  . Hyperkalemia 01/14/2020  . Chronic RUQ pain 01/14/2020  . Sensation of fullness in ear, bilateral 01/11/2020  . Aphasia due to acute stroke ([place]) 08/23/2019   . Hyperlipemia 08/23/2019  . ICH (intracerebral hemorrhage) (HCC) L parietal w/ L SDH s/p crani 08/09/2019  . Subdural hematoma, acute ([place]) 08/09/2019  . Healthcare maintenance 06/20/2019  . History of strabismus surgery 03/31/2019  . Moderate, recurrent MDD 06/06/2018  . 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 Osteoarthritis  of right knee 06/05/2018  . Essential hypertension 06/05/2018  . Anxiety 06/05/2018  . Age-related nuclear cataract of both eyes 11/26/2014  . Diplopia 11/26/2014  . Monocular esotropia of left eye 11/26/2014  . Corneal transplant failure 10/27/2011    Arlen Legendre, Annye Rusk MS, CCC-SLP 05/02/2020, 2:16 PM  [facility] [address] [place], [place], [address] Phone: [phone]   Fax:  [phone]   Name: JENAYA SAAR MRN: [id number] Date of Birth: [birth year]

## 2020-05-02 NOTE — Patient Instructions (Signed)
  Dinner meal plans:   Sunday:  Monday:  Tuesday:  Wednesday:  Thursday:  Friday:  Saturday:   Sunday:  Monday:  Tuesday:  Wednesday:  Thursday:  Friday:  Saturday:   Have all ingredients and utensils out and prepped before you start cooking Scan kitchen throughout cooking and before you serve the meal.  Include some days to have leftovers - plan to cook double amount for left overs  Plan meals ahead for the week - it may make it easier on brain

## 2020-05-06 ENCOUNTER — Ambulatory Visit: Payer: Medicare Other

## 2020-05-06 ENCOUNTER — Other Ambulatory Visit: Payer: Self-pay

## 2020-05-06 ENCOUNTER — Ambulatory Visit: Payer: Medicare Other | Admitting: Occupational Therapy

## 2020-05-06 DIAGNOSIS — H539 Unspecified visual disturbance: Secondary | ICD-10-CM

## 2020-05-06 DIAGNOSIS — R41840 Attention and concentration deficit: Secondary | ICD-10-CM | POA: Diagnosis not present

## 2020-05-06 DIAGNOSIS — R41841 Cognitive communication deficit: Secondary | ICD-10-CM

## 2020-05-06 DIAGNOSIS — R278 Other lack of coordination: Secondary | ICD-10-CM

## 2020-05-06 DIAGNOSIS — I69398 Other sequelae of cerebral infarction: Secondary | ICD-10-CM

## 2020-05-06 DIAGNOSIS — R2689 Other abnormalities of gait and mobility: Secondary | ICD-10-CM

## 2020-05-06 DIAGNOSIS — R2681 Unsteadiness on feet: Secondary | ICD-10-CM

## 2020-05-06 DIAGNOSIS — M6281 Muscle weakness (generalized): Secondary | ICD-10-CM

## 2020-05-06 DIAGNOSIS — R41842 Visuospatial deficit: Secondary | ICD-10-CM

## 2020-05-06 NOTE — Patient Instructions (Signed)
   Meal plan on Sundays for the upcoming week

## 2020-05-06 NOTE — Patient Instructions (Signed)
Access Code: [email or web address] URL: [email or web address] Date: 05/06/2020 Prepared by: Baldomero Lamy  Exercises Tandem Walking with Counter Support - 1 x daily - 5 x weekly - 1 sets - 5 reps Walking with Head Rotation - 1 x daily - 5 x weekly - 1 sets - 10 reps Romberg Stance Eyes Closed on Foam Pad - 1 x daily - 5 x weekly - 1 sets - 3 reps - 30 hold Romberg Stance on Foam Pad with Head Rotation - 1 x daily - 5 x weekly - 2 sets - 10 reps Romberg Stance with Head Nods on Foam Pad - 1 x daily - 5 x weekly - 2 sets - 10 reps Half Tandem Stance Balance with Head Rotation - 1 x daily - 5 x weekly - 1 sets - 3 reps - 30 hold

## 2020-05-06 NOTE — Therapy (Signed)
[facility] [address] [place], [place], [address] Phone: [phone]   Fax:  [phone]  Physical Therapy Treatment/Discharge Summary  Patient Details  Name: Michelle Carrillo MRN: [id number] Date of Birth: 1950-04-16 Referring Provider (PT): Referred by Joni Reining, DO. Followed by Frann Rider, NP  PHYSICAL THERAPY DISCHARGE SUMMARY  Visits from Start of Care: 16  Current functional level related to goals / functional outcomes: See Clinical Impression Statement for Details   Remaining deficits: Decreased Dual Task, Vision Impairments   Education / Equipment: HEP Provided  Plan: Patient agrees to discharge.  Patient goals were met. Patient is being discharged due to meeting the stated rehab goals.  ?????          Encounter Date: 05/06/2020   PT End of Session - 05/06/20 1449    Visit Number 16    Number of Visits 16    Date for PT Re-Evaluation 06/07/20    Authorization Type Medicare (10th Visit PN)    Progress Note Due on Visit 20    PT Start Time 1448    PT Stop Time 1528    PT Time Calculation (min) 40 min    Equipment Utilized During Treatment Gait belt    Activity Tolerance Patient tolerated treatment well    Behavior During Therapy WFL for tasks assessed/performed           Past Medical History:  Diagnosis Date  . Anxiety    on meds  . Arthritis    bilateral knees  . Cataract    RIGHT eye  . Depression    on meds  . Herpes zoster kerstitis s/p corneal transplant 2015   . Hypertension    on meds  . Right knee osteoarthritis   . Strabismus    due to eye injury as a child  . Stroke [facility]) 07/2019   stroke with craniotomy    Past Surgical History:  Procedure Laterality Date  . CESAREAN SECTION      x 2   . CHOLECYSTECTOMY    . COLONOSCOPY  04/22/2020  . CORNEAL TRANSPLANT Left    2006 and 2016  . CRANIOTOMY Left 08/09/2019   Procedure: CRANIOTOMY HEMATOMA  EVACUATION SUBDURAL;  Surgeon: Ashok Pall, MD;  Location: [facility];  Service: Neurosurgery;  Laterality: Left;  left    There were no vitals filed for this visit.   Subjective Assessment - 05/06/20 1449    Subjective No new changes or falls to report. Reports exercises are still going well. Reports that she still has not recieved her balance pad.    Patient is accompained by: Interpreter    Pertinent History HTN, Anxiety, Depression, OA, Strabismus    Limitations Walking    Patient Stated Goals walk better    Currently in Pain? No/denies              [facility] PT Assessment - 05/06/20 0001      Functional Gait  Assessment   Gait assessed  Yes    Gait Level Surface Walks 20 ft in less than 5.5 sec, no assistive devices, good speed, no evidence for imbalance, normal gait pattern, deviates no more than 6 in outside of the 12 in walkway width.    Change in Gait Speed Able to smoothly change walking speed without loss of balance or gait deviation. Deviate no more than 6 in outside of the 12 in walkway width.    Gait with Horizontal Head Turns Performs head turns  smoothly with slight change in gait velocity (eg, minor disruption to smooth gait path), deviates 6-10 in outside 12 in walkway width, or uses an assistive device.    Gait with Vertical Head Turns Performs head turns with no change in gait. Deviates no more than 6 in outside 12 in walkway width.    Gait and Pivot Turn Pivot turns safely within 3 sec and stops quickly with no loss of balance.    Step Over Obstacle Is able to step over one shoe box (4.5 in total height) without changing gait speed. No evidence of imbalance.    Gait with Narrow Base of Support Ambulates 7-9 steps.    Gait with Eyes Closed Walks 20 ft, uses assistive device, slower speed, mild gait deviations, deviates 6-10 in outside 12 in walkway width. Ambulates 20 ft in less than 9 sec but greater than 7 sec.    Ambulating Backwards Walks 20 ft, uses assistive device,  slower speed, mild gait deviations, deviates 6-10 in outside 12 in walkway width.    Steps Alternating feet, must use rail.    Total Score 24    FGA comment: 24/30               [facility] Adult PT Treatment/Exercise - 05/06/20 0001      Ambulation/Gait   Ambulation/Gait Yes    Ambulation/Gait Assistance 5: Supervision    Ambulation/Gait Assistance Details throughout therapy gym with activites    Assistive device None    Gait Pattern Within Functional Limits    Ambulation Surface Level;Indoor      Standardized Balance Assessment   Standardized Balance Assessment Timed Up and Go Test      Timed Up and Go Test   TUG Normal TUG    Normal TUG (seconds) 9.92      Neuro Re-ed    Neuro Re-ed Details  Completed entire review of current balance HEP and progressed as tolerated. Continue to encourage compliance with HEP.          Completed all of the following exercises as review of HEP. Patient able to tolerate addition of head turns to tandem stance for increased challenge. Verbal cues provided to patient and advice to family members for proper completion. Educated to complete on balance pad once it is delivered, until then continue to utilize pillows at home.     Access Code: [email or web address] URL: [email or web address] Date: 05/06/2020 Prepared by: Baldomero Lamy  Exercises Tandem Walking with Counter Support - 1 x daily - 5 x weekly - 1 sets - 5 reps Walking with Head Rotation - 1 x daily - 5 x weekly - 1 sets - 10 reps Romberg Stance Eyes Closed on Foam Pad - 1 x daily - 5 x weekly - 1 sets - 3 reps - 30 hold Romberg Stance on Foam Pad with Head Rotation - 1 x daily - 5 x weekly - 2 sets - 10 reps Romberg Stance with Head Nods on Foam Pad - 1 x daily - 5 x weekly - 2 sets - 10 reps Half Tandem Stance Balance with Head Rotation - 1 x daily - 5 x weekly - 1 sets - 3 reps - 30 hold        PT Education - 05/06/20 1519    Education Details progress toward LTG; HEP Update     Person(s) Educated Patient;Child(ren)    Methods Explanation;Demonstration;Handout    Comprehension Verbalized understanding;Returned demonstration;Verbal cues required  PT Short Term Goals - 04/08/20 1627      PT SHORT TERM GOAL #1   Title Patient will be independent with progres balance HEP and report compliance of at least 4x/week (All STGS Due: 04/22/20)    Baseline decreased compliance, completing 2-3x/weekly    Time 2    Period Weeks    Status Revised    Target Date 04/22/20             PT Long Term Goals - 05/06/20 1503      PT LONG TERM GOAL #1   Title Patient will be independent with final progressive HEP for strengthening/balance (ALL LTGS DUE: 05/13/20)    Baseline reports independence with family member assistance    Time 5    Period Weeks    Status Achieved      PT LONG TERM GOAL #2   Title Patient will improve TUG to </= 10 seconds without AD to demonstrate further reduced fall risk    Baseline 14.32 secs; 11.37 secs; 9.92 secs    Time 5    Period Weeks    Status Achieved      PT LONG TERM GOAL #3   Title Patient will improve FGA to >/= 23/30 to demosntrate improved balance and reduced fall risk.    Baseline 19/30; 24/30    Time 5    Period Weeks    Status Achieved                 Plan - 05/06/20 1535    Clinical Impression Statement Assessed patient's progress toward LTG. Patient able to meet all LTG today during session demonstrating improved balance, activity tolerance, and reduced fall risk. Patient scored 24/30 on FGA, and completed TUG in 9.92 secs both demonstrating reduced fall risk. Reviewed and updated HEP. Patient has made significant progress with PT services. PT stating readiness for discharge at this time, with patient and family member verbalizing agreeement.    Personal Factors and Comorbidities Comorbidity 3+    Comorbidities HTN, Anxiety, Depression, OA, Strabismus    Examination-Activity Limitations Caring for  Others;Locomotion Level;Stairs;Transfers    Examination-Participation Restrictions Cleaning;Community Activity;Medication Management    Stability/Clinical Decision Making Stable/Uncomplicated    Rehab Potential Good    PT Frequency 1x / week    PT Duration --   5 weeks   PT Treatment/Interventions ADLs/Self Care Home Management;DME Instruction;Stair training;Gait training;Functional mobility training;Therapeutic activities;Balance training;Neuromuscular re-education;Therapeutic exercise;Patient/family education;Orthotic Fit/Training;Manual techniques;Vestibular;Passive range of motion    PT Home Exercise Plan Access Code: [email or web address]    Consulted and Agree with Plan of Care Family member/caregiver;Patient    Family Member Consulted Daughter           Patient will benefit from skilled therapeutic intervention in order to improve the following deficits and impairments:  Abnormal gait,Decreased balance,Decreased mobility,Difficulty walking,Decreased endurance,Pain,Decreased strength,Decreased safety awareness,Decreased knowledge of use of DME,Decreased activity tolerance,Decreased range of motion,Impaired vision/preception  Visit Diagnosis: Muscle weakness (generalized)  Other abnormalities of gait and mobility  Unsteadiness on feet     Problem List Patient Active Problem List   Diagnosis Date Noted  . Vitamin D deficiency 04/24/2020  . Eye pain 03/13/2020  . Hyperkalemia 01/14/2020  . Chronic RUQ pain 01/14/2020  . Sensation of fullness in ear, bilateral 01/11/2020  . Aphasia due to acute stroke ([place]) 08/23/2019  . Hyperlipemia 08/23/2019  . ICH (intracerebral hemorrhage) (HCC) L parietal w/ L SDH s/p crani 08/09/2019  . Subdural hematoma, acute ([place]) 08/09/2019  . Healthcare maintenance  06/20/2019  . History of strabismus surgery 03/31/2019  . Moderate, recurrent MDD 06/06/2018  . 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 Osteoarthritis of  right knee 06/05/2018  . Essential hypertension 06/05/2018  . Anxiety 06/05/2018  . Age-related nuclear cataract of both eyes 11/26/2014  . Diplopia 11/26/2014  . Monocular esotropia of left eye 11/26/2014  . Corneal transplant failure 10/27/2011    Jones Bales, PT, DPT 05/06/2020, 3:37 PM  [facility] [address] [place], [place], [address] Phone: [phone]   Fax:  [phone]  Name: Michelle Carrillo MRN: [id number] Date of Birth: [birth year]

## 2020-05-06 NOTE — Therapy (Signed)
Michelle Carrillo [address] [place], [place], [address] Phone: [phone]   Fax:  [phone]  Speech Language Pathology Treatment  Patient Details  Name: Michelle Carrillo MRN: [id number] Date of Birth: June 24, 1950 Referring Provider (SLP): Frann Rider, NP   Encounter Date: 05/06/2020   End of Session - 05/06/20 1631    Visit Number 14    Number of Visits 17    Date for SLP Re-Evaluation 05/16/20   extended as Michelle Carrillo missed 2 weeks of ST   SLP Start Time 3143    SLP Stop Time  8887    SLP Time Calculation (min) 41 min    Activity Tolerance Patient tolerated treatment well           Past Medical History:  Diagnosis Date  . Anxiety    on meds  . Arthritis    bilateral knees  . Cataract    RIGHT eye  . Depression    on meds  . Herpes zoster kerstitis s/p corneal transplant 2015   . Hypertension    on meds  . Right knee osteoarthritis   . Strabismus    due to eye injury as a child  . Stroke [facility]) 07/2019   stroke with craniotomy    Past Surgical History:  Procedure Laterality Date  . CESAREAN SECTION      x 2   . CHOLECYSTECTOMY    . COLONOSCOPY  04/22/2020  . CORNEAL TRANSPLANT Left    2006 and 2016  . CRANIOTOMY Left 08/09/2019   Procedure: CRANIOTOMY HEMATOMA EVACUATION SUBDURAL;  Surgeon: Ashok Pall, MD;  Location: [facility];  Service: Neurosurgery;  Laterality: Left;  left    There were no vitals filed for this visit.   Subjective Assessment - 05/06/20 1556    Subjective Michelle Carrillo attended with pt today. "It's good I'm here because a lot of the time I don't know what my mom needs to do for therapy."    Currently in Pain? No/denies                 ADULT SLP TREATMENT - 05/06/20 1600      General Information   Behavior/Cognition Alert;Cooperative;Pleasant mood      Treatment Provided   Treatment provided Cognitive-Linquistic      Cognitive-Linquistic Treatment   Treatment focused  on Cognition;Patient/family/caregiver education    Skilled Treatment Pt recalled what was covered in last ST session with occasional min cues. Pt did not do any meal planning since last session, so SLP assisted pt and daughter in meal planning for the remainder of the week - SLP educated daughter about cueing pt for missing ingredients and that a list of entrees and sides may be helpful as SLP skillfully noted pt was having difficulty generating side items for each entree.      Assessment / Recommendations / Plan   Plan Continue with current plan of care      Progression Toward Goals   Progression toward goals Progressing toward goals            SLP Education - 05/06/20 1631    Education Details how to cue Michelle Carrillo for ingredients missed, may want to make a list of entrees and sides to assist pt initiation for meals    Person(s) Educated Patient;Child(ren)    Methods Explanation;Demonstration;Verbal cues    Comprehension Verbalized understanding;Returned demonstration;Verbal cues required;Need further instruction            SLP Short Term Goals -  05/02/20 1413      SLP SHORT TERM GOAL #1   Title Pt wil recall and request am meds with occasional min A from caregivers over 3 sessions    Status Achieved      SLP SHORT TERM GOAL #2   Title Pt and family will carryover 3 compensations for auditory comprehension so pt can accurately follow directions and understand daily plan    Status Partially Met      SLP SHORT TERM GOAL #3   Title Pt will be oriented to day and time as measured by her verbalizing times she needs to get dressed and leave for her appointments with compensatory strategies and cues from family    Baseline 03/17/20; 03/19/20    Status Achieved      SLP SHORT TERM GOAL #4   Title Complete CLQT    Status Deferred            SLP Long Term Goals - 05/06/20 1632      SLP LONG TERM GOAL #1   Title Pt will use external aids to be oriented to date, time and daily plan with  rare min A from family    Status Achieved   list of appointmetns and dates     SLP LONG TERM GOAL #2   Title Pt will get ready for appointments and daily tasks in timely manner with occasional min A from family    Time 2    Period Weeks    Status Revised      SLP LONG TERM GOAL #3   Title Pt will ID and correct language errors in converation with occasional min A from family over 2 sessions    Time 2    Period Weeks    Status Achieved            Plan - 05/06/20 1632    Clinical Impression Statement Michelle Carrillo is referred for outpt ST s/p SDH/craniotomy due to ongoing cognitive communication impairments.  See 'skilled intervention" for further details on today's session.  I recommend cont'd skilled ST to maximize cognitive linguistic skills for safety, QOL and to reduce caregiver burden    Speech Therapy Frequency 2x / week    Duration 8 weeks   17 visits   Treatment/Interventions SLP instruction and feedback;Compensatory strategies;Functional tasks;Cognitive reorganization;Compensatory techniques;Cueing hierarchy;Environmental controls;Patient/family education;Multimodal communcation approach;Internal/external aids    Potential to Achieve Goals Good           Patient will benefit from skilled therapeutic intervention in order to improve the following deficits and impairments:   Cognitive communication deficit    Problem List Patient Active Problem List   Diagnosis Date Noted  . Vitamin D deficiency 04/24/2020  . Eye pain 03/13/2020  . Hyperkalemia 01/14/2020  . Chronic RUQ pain 01/14/2020  . Sensation of fullness in ear, bilateral 01/11/2020  . Aphasia due to acute stroke ([place]) 08/23/2019  . Hyperlipemia 08/23/2019  . ICH (intracerebral hemorrhage) (HCC) L parietal w/ L SDH s/p crani 08/09/2019  . Subdural hematoma, acute ([place]) 08/09/2019  . Healthcare maintenance 06/20/2019  . History of strabismus surgery 03/31/2019  . Moderate, recurrent MDD 06/06/2018  .  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 Osteoarthritis of right knee 06/05/2018  . Essential hypertension 06/05/2018  . Anxiety 06/05/2018  . Age-related nuclear cataract of both eyes 11/26/2014  . Diplopia 11/26/2014  . Monocular esotropia of left eye 11/26/2014  . Corneal transplant failure 10/27/2011  [facility] ,[place], [place]  05/06/2020, 4:41 PM  [facility] [address] [place], [place], [address] Phone: [phone]   Fax:  [phone]   Name: KOULA VENIER MRN: [id number] Date of Birth: 02/25/1951

## 2020-05-07 NOTE — Therapy (Signed)
[facility] [address] [place], [place], [address] Phone: [phone]   Fax:  [phone]  Occupational Therapy Treatment  Patient Details  Name: Michelle Carrillo MRN: [id number] Date of Birth: 03-12-1951 Referring Provider (OT): Frann Rider, NP   Encounter Date: 05/06/2020   OT End of Session - 05/06/20 1427    Visit Number 10    Number of Visits 17    Date for OT Re-Evaluation 05/21/20    Authorization Type Medicare    Authorization - Visit Number 10    Authorization - Number of Visits 10    OT Start Time 1406    OT Stop Time 1445    OT Time Calculation (min) 39 min    Activity Tolerance Patient tolerated treatment well    Behavior During Therapy [facility] for tasks assessed/performed           Past Medical History:  Diagnosis Date  . Anxiety    on meds  . Arthritis    bilateral knees  . Cataract    RIGHT eye  . Depression    on meds  . Herpes zoster kerstitis s/p corneal transplant 2015   . Hypertension    on meds  . Right knee osteoarthritis   . Strabismus    due to eye injury as a child  . Stroke [facility]) 07/2019   stroke with craniotomy    Past Surgical History:  Procedure Laterality Date  . CESAREAN SECTION      x 2   . CHOLECYSTECTOMY    . COLONOSCOPY  04/22/2020  . CORNEAL TRANSPLANT Left    2006 and 2016  . CRANIOTOMY Left 08/09/2019   Procedure: CRANIOTOMY HEMATOMA EVACUATION SUBDURAL;  Surgeon: Ashok Pall, MD;  Location: [facility];  Service: Neurosurgery;  Laterality: Left;  left    There were no vitals filed for this visit.   Subjective Assessment - 05/07/20 1529    Subjective  Pt was accompanied by her dtr who translates for her    Pertinent History PMH: HTN, Anxiety, Depression, OA, Strabismus   Presented to ED on 08/09/19 with altered mental status. CT scan of the head showed a large moderate size cortically based hematoma in the left parietal region with extension into the  subdural space resulting in a fair sized subdural hematoma with left-to-right midline shift. Patient recieved inpatient rehab services and was discharged home    Patient Stated Goals I want to get better and better    Currently in Pain? No/denies                Treatment: Pt attempted to match analong times with clock faces, however pt was unfamiliar with this before her CVA so task was discontinued. Card matching task, min-mod v.c to match by number or face card, pt was instructed to perform at home . Scanning and coordination task to place pieces in concentraction board, max difficulty/ v.c to locate items and place correctly.                  OT Short Term Goals - 05/02/20 1252      OT SHORT TERM GOAL #1   Title I with inital HEP.    Time 4    Status On-going   need to add to HEP   Target Date 04/23/20      OT SHORT TERM GOAL #2   Title Pt/ caregiever will verbalize understanding of compensatory strategies for visual deficits.    Time  4    Period Weeks    Status Achieved   Discussed strategies with pt/ dtr.     OT SHORT TERM GOAL #3   Title Pt will demonstrate ability to sequence a simple functional task/ home management task with no more than min v.c    Time 4    Period Weeks    Status Achieved   met per pt family for simple cooking at home     OT SHORT TERM GOAL #4   Title Pt will perform environmental scanning with 80% or better accuracy in a min distracting envireonment.    Time 4    Period Weeks    Status Achieved   100% today     OT SHORT TERM GOAL #5   Title Pt will complete table top scanning tasks with 80% accuracy or better    Time 4    Period Weeks    Status On-going   not consistent            OT Long Term Goals - 05/06/20 1437      OT LONG TERM GOAL #1   Title Pt will complete a simple cooking task including locating items in a resonable amount of time modified independently.    Status On-going      OT LONG TERM GOAL #2    Title Pt. will demonstrate improved bilateral UE coordination as evidenced by decreasing 9 hole peg test score by 5 secs.      OT LONG TERM GOAL #3   Title Pt will complete tabletop scanning activities with 90% or better accuracy.    Status On-going      OT LONG TERM GOAL #4   Status On-going                 Plan - 05/07/20 1526    Clinical Impression Statement for the reporting perriod of 03/26/21-05/06/20 pt is progressing towards goals. She remains limited by visual deficits, aphasia and apraxia. Pt can benefit from skilled occupational therapy to address her deficits in order to maximize pt's safety and I with ADLs/IADLS see goals for progress.    OT Occupational Profile and History Detailed Assessment- Review of Records and additional review of physical, cognitive, psychosocial history related to current functional performance    Occupational performance deficits (Please refer to evaluation for details): ADL's;IADL's;Leisure;Social Participation    Body Structure / Function / Physical Skills ADL;Balance;Mobility;Strength;UE functional use;FMC;Gait;Coordination;Decreased knowledge of precautions;GMC;ROM;IADL;Dexterity;Decreased knowledge of use of DME    Cognitive Skills Attention;Memory;Problem Solve;Safety Awareness;Sequencing;Thought;Understand    Rehab Potential Good    OT Frequency 2x / week    OT Duration 8 weeks    OT Treatment/Interventions Self-care/ADL training;Therapeutic exercise;Balance training;Aquatic Therapy;Ultrasound;Neuromuscular education;Manual Therapy;Splinting;Therapeutic activities;Cryotherapy;Paraffin;DME and/or AE instruction;Cognitive remediation/compensation;Visual/perceptual remediation/compensation;Fluidtherapy;Gait Training;Moist Heat;Contrast Bath;Passive range of motion;Patient/family education    Plan visual perceptual and simple functional tasks    Consulted and Agree with Plan of Care Patient;Family member/caregiver           Patient will  benefit from skilled therapeutic intervention in order to improve the following deficits and impairments:   Body Structure / Function / Physical Skills: ADL,Balance,Mobility,Strength,UE functional use,FMC,Gait,Coordination,Decreased knowledge of precautions,GMC,ROM,IADL,Dexterity,Decreased knowledge of use of DME Cognitive Skills: Attention,Memory,Problem Solve,Safety Awareness,Sequencing,Thought,Understand     Visit Diagnosis: Muscle weakness (generalized)  Visuospatial deficit  Attention and concentration deficit  Visual disturbance as complication of stroke  Other lack of coordination    Problem List Patient Active Problem List   Diagnosis Date Noted  . Vitamin  D deficiency 04/24/2020  . Eye pain 03/13/2020  . Hyperkalemia 01/14/2020  . Chronic RUQ pain 01/14/2020  . Sensation of fullness in ear, bilateral 01/11/2020  . Aphasia due to acute stroke ([place]) 08/23/2019  . Hyperlipemia 08/23/2019  . ICH (intracerebral hemorrhage) (HCC) L parietal w/ L SDH s/p crani 08/09/2019  . Subdural hematoma, acute ([place]) 08/09/2019  . Healthcare maintenance 06/20/2019  . History of strabismus surgery 03/31/2019  . Moderate, recurrent MDD 06/06/2018  . 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 Osteoarthritis of right knee 06/05/2018  . Essential hypertension 06/05/2018  . Anxiety 06/05/2018  . Age-related nuclear cataract of both eyes 11/26/2014  . Diplopia 11/26/2014  . Monocular esotropia of left eye 11/26/2014  . Corneal transplant failure 10/27/2011    [facility] 05/07/2020, 3:30 PM Theone Murdoch, OTR/L [phone] Phone: [phone] 3:36 PM 05/07/20 [facility] [address] [place], [place], [address] Phone: [phone]   Fax:  [phone]  Name: JALEEN GRUPP MRN: [id number] Date of Birth: Aug 20, 1950

## 2020-05-08 ENCOUNTER — Ambulatory Visit: Payer: Medicare Other | Admitting: Physical Therapy

## 2020-05-08 ENCOUNTER — Encounter: Payer: Medicare Other | Admitting: Occupational Therapy

## 2020-05-08 ENCOUNTER — Ambulatory Visit: Payer: Medicare Other

## 2020-05-16 DIAGNOSIS — Z23 Encounter for immunization: Secondary | ICD-10-CM | POA: Diagnosis not present

## 2020-06-10 ENCOUNTER — Ambulatory Visit: Payer: Medicare Other | Attending: Adult Health

## 2020-06-10 ENCOUNTER — Other Ambulatory Visit: Payer: Self-pay

## 2020-06-10 DIAGNOSIS — R41844 Frontal lobe and executive function deficit: Secondary | ICD-10-CM | POA: Insufficient documentation

## 2020-06-10 DIAGNOSIS — I69398 Other sequelae of cerebral infarction: Secondary | ICD-10-CM | POA: Insufficient documentation

## 2020-06-10 DIAGNOSIS — R41840 Attention and concentration deficit: Secondary | ICD-10-CM | POA: Insufficient documentation

## 2020-06-10 DIAGNOSIS — R2681 Unsteadiness on feet: Secondary | ICD-10-CM | POA: Diagnosis not present

## 2020-06-10 DIAGNOSIS — H539 Unspecified visual disturbance: Secondary | ICD-10-CM | POA: Diagnosis not present

## 2020-06-10 DIAGNOSIS — R2689 Other abnormalities of gait and mobility: Secondary | ICD-10-CM | POA: Diagnosis not present

## 2020-06-10 DIAGNOSIS — R41841 Cognitive communication deficit: Secondary | ICD-10-CM | POA: Diagnosis not present

## 2020-06-10 DIAGNOSIS — R278 Other lack of coordination: Secondary | ICD-10-CM | POA: Insufficient documentation

## 2020-06-10 DIAGNOSIS — R41842 Visuospatial deficit: Secondary | ICD-10-CM | POA: Diagnosis not present

## 2020-06-10 DIAGNOSIS — M6281 Muscle weakness (generalized): Secondary | ICD-10-CM | POA: Diagnosis not present

## 2020-06-11 NOTE — Therapy (Addendum)
[facility] [address] [place], [place], [address] Phone: [phone]   Fax:  [phone]  Speech Language Pathology Treatment/Renewal summary  Patient Details  Name: Michelle Carrillo MRN: [id number] Date of Birth: August 08, 1950 Referring Provider (SLP): Frann Rider, NP   Encounter Date: 06/10/2020   End of Session - 06/11/20 2353    Visit Number 15    Number of Visits 17    Date for SLP Re-Evaluation 06/24/20      SLP Start Time 1404    SLP Stop Time  1445    SLP Time Calculation (min) 41 min    Activity Tolerance Patient tolerated treatment well           Past Medical History:  Diagnosis Date  . Anxiety    on meds  . Arthritis    bilateral knees  . Cataract    RIGHT eye  . Depression    on meds  . Herpes zoster kerstitis s/p corneal transplant 2015   . Hypertension    on meds  . Right knee osteoarthritis   . Strabismus    due to eye injury as a child  . Stroke [facility]) 07/2019   stroke with craniotomy    Past Surgical History:  Procedure Laterality Date  . CESAREAN SECTION      x 2   . CHOLECYSTECTOMY    . COLONOSCOPY  04/22/2020  . CORNEAL TRANSPLANT Left    2006 and 2016  . CRANIOTOMY Left 08/09/2019   Procedure: CRANIOTOMY HEMATOMA EVACUATION SUBDURAL;  Surgeon: Ashok Pall, MD;  Location: [facility];  Service: Neurosurgery;  Laterality: Left;  left    There were no vitals filed for this visit.   Subjective Assessment - 06/11/20 2351    Subjective Michelle Carrillo here with pt today. Pt gives permission for Michelle Carrillo to translate.    Patient is accompained by: Family member   daughter Michelle Carrillo                ADULT SLP TREATMENT - 06/11/20 0001      General Information   Behavior/Cognition Alert;Cooperative;Pleasant mood      Treatment Provided   Treatment provided Cognitive-Linquistic      Cognitive-Linquistic Treatment   Treatment focused on Cognition;Patient/family/caregiver  education    Skilled Treatment Pt did sit with daughter Michelle Carrillo and make list of entrees and sides but she has not really used the process of picking an entree and sides form the lists but states that the way she has been doing it is working fine - both she and husband are satisfied. Pt endorses today a difficulty with adding and subtracting bill amounts. SLP practiced sustained attention with pt counting bills and verbalizing amounts - pt unaware of errors 25% of the time. Told pt to hoave daughter with her when she counts money, for asistance.      Assessment / Recommendations / Plan   Plan Continue with current plan of care      Progression Toward Goals   Progression toward goals Not progressing toward goals (comment)   ? carryover of therapy suggestions to home/between sisters re: pt           SLP Education - 06/11/20 2352    Education Details Family will likely have to help pt count $ for 4-6 weeks before pt gets procedure    Person(s) Educated Patient;Child(ren)    Methods Explanation;Demonstration;Verbal cues    Comprehension Verbalized understanding;Returned demonstration;Verbal cues required;Need further instruction  SLP Short Term Goals - 05/02/20 1413      SLP SHORT TERM GOAL #1   Title Pt wil recall and request am meds with occasional min A from caregivers over 3 sessions    Status Achieved      SLP SHORT TERM GOAL #2   Title Pt and family will carryover 3 compensations for auditory comprehension so pt can accurately follow directions and understand daily plan    Status Partially Met      SLP SHORT TERM GOAL #3   Title Pt will be oriented to day and time as measured by her verbalizing times she needs to get dressed and leave for her appointments with compensatory strategies and cues from family    Baseline 03/17/20; 03/19/20    Status Achieved      SLP SHORT TERM GOAL #4   Title Complete CLQT    Status Deferred            SLP Long Term Goals - 06/11/20  2354      SLP LONG TERM GOAL #1   Title Pt will use external aids to be oriented to date, time and daily plan with rare min A from family    Status Achieved   list of appointmetns and dates     SLP LONG TERM GOAL #2   Title Pt will get ready for appointments and daily tasks in timely manner with occasional min A from family    Time 1    Period Weeks    Status On-going      SLP LONG TERM GOAL #3   Title Pt will ID and correct language errors in converation with occasional min A from family over 2 sessions    Time 2    Period Weeks    Status Achieved            Plan - 06/11/20 2354    Clinical Impression Statement Michelle Carrillo is referred for outpt ST s/p SDH/craniotomy due to ongoing cognitive communication impairments.  See 'skilled intervention" for further details on today's session.  I recommend cont'd skilled ST to maximize cognitive linguistic skills for safety, QOL and to reduce caregiver burden. Pt agrees that one- two more sessions are good until d/c.    Speech Therapy Frequency 2x / week    Duration 8 weeks   17 visits   Treatment/Interventions SLP instruction and feedback;Compensatory strategies;Functional tasks;Cognitive reorganization;Compensatory techniques;Cueing hierarchy;Environmental controls;Patient/family education;Multimodal communcation approach;Internal/external aids    Potential to Achieve Goals Good           Patient will benefit from skilled therapeutic intervention in order to improve the following deficits and impairments:   Cognitive communication deficit    Problem List Patient Active Problem List   Diagnosis Date Noted  . Vitamin D deficiency 04/24/2020  . Eye pain 03/13/2020  . Hyperkalemia 01/14/2020  . Chronic RUQ pain 01/14/2020  . Sensation of fullness in ear, bilateral 01/11/2020  . 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 Hyperlipemia 08/23/2019  . ICH (intracerebral hemorrhage) (HCC) L parietal w/ L SDH s/p crani  08/09/2019  . Subdural hematoma, acute ([facility]) 08/09/2019  . Healthcare maintenance 06/20/2019  . History of strabismus surgery 03/31/2019  . Moderate, recurrent MDD 06/06/2018  . 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 Osteoarthritis of right knee 06/05/2018  . Essential hypertension 06/05/2018  . Anxiety 06/05/2018  . Age-related nuclear cataract of both eyes 11/26/2014  . Diplopia 11/26/2014  .  Monocular esotropia of left eye 11/26/2014  . Corneal transplant failure 10/27/2011    [facility] ,MS, CCC-SLP  06/11/2020, 11:55 PM  [facility] [address] [place], [place], [address] Phone: [phone]   Fax:  [phone]   Name: Michelle Carrillo MRN: [id number] Date of Birth: 05/21/1950

## 2020-06-11 NOTE — Patient Instructions (Signed)
[name] will have to help Michelle Carrillo with counting money until she gets the procedure down

## 2020-06-13 ENCOUNTER — Ambulatory Visit: Payer: Medicare Other | Admitting: Speech Pathology

## 2020-06-13 ENCOUNTER — Encounter: Payer: Self-pay | Admitting: Speech Pathology

## 2020-06-13 ENCOUNTER — Other Ambulatory Visit: Payer: Self-pay

## 2020-06-13 DIAGNOSIS — R41840 Attention and concentration deficit: Secondary | ICD-10-CM | POA: Diagnosis not present

## 2020-06-13 DIAGNOSIS — R41842 Visuospatial deficit: Secondary | ICD-10-CM | POA: Diagnosis not present

## 2020-06-13 DIAGNOSIS — R278 Other lack of coordination: Secondary | ICD-10-CM | POA: Diagnosis not present

## 2020-06-13 DIAGNOSIS — R41841 Cognitive communication deficit: Secondary | ICD-10-CM | POA: Diagnosis not present

## 2020-06-13 DIAGNOSIS — H539 Unspecified visual disturbance: Secondary | ICD-10-CM | POA: Diagnosis not present

## 2020-06-13 DIAGNOSIS — I69398 Other sequelae of cerebral infarction: Secondary | ICD-10-CM | POA: Diagnosis not present

## 2020-06-13 NOTE — Therapy (Addendum)
[facility] [address] [place], [place], [address] Phone: [phone]   Fax:  [phone]  Speech Language Pathology Treatment  Patient Details  Name: Michelle Carrillo MRN: [id number] Date of Birth: [birth year] Referring Provider (SLP): Frann Rider, NP   Encounter Date: 06/13/2020   End of Session - 06/13/20 1401    Visit Number 16    Number of Visits 17    Date for SLP Re-Evaluation 06/24/20    SLP Start Time 5945   pt late - in restroom   SLP Stop Time  8592    SLP Time Calculation (min) 38 min           Past Medical History:  Diagnosis Date  . Anxiety    on meds  . Arthritis    bilateral knees  . Cataract    RIGHT eye  . Depression    on meds  . Herpes zoster kerstitis s/p corneal transplant 2015   . Hypertension    on meds  . Right knee osteoarthritis   . Strabismus    due to eye injury as a child  . Stroke [facility]) 07/2019   stroke with craniotomy    Past Surgical History:  Procedure Laterality Date  . CESAREAN SECTION      x 2   . CHOLECYSTECTOMY    . COLONOSCOPY  04/22/2020  . CORNEAL TRANSPLANT Left    2006 and 2016  . CRANIOTOMY Left 08/09/2019   Procedure: CRANIOTOMY HEMATOMA EVACUATION SUBDURAL;  Surgeon: Ashok Pall, MD;  Location: [facility];  Service: Neurosurgery;  Laterality: Left;  left    There were no vitals filed for this visit.   Subjective Assessment - 06/13/20 1326    Patient is accompained by: Family member;Interpreter   Lucy   Currently in Pain? No/denies                 ADULT SLP TREATMENT - 06/13/20 1326      General Information   Behavior/Cognition Alert;Cooperative;Pleasant mood      Treatment Provided   Treatment provided Cognitive-Linquistic      Cognitive-Linquistic Treatment   Treatment focused on Cognition;Patient/family/caregiver education    Skilled Treatment [name] with quesions re: when her brain will heal as she was told 1 year and is  coming up on this. Educated her that her brain can continue to heal as long as you are working on it. She has not been cooking much, but when she does, she does make enough for left overs. Simple money counting from [facility] 8 book with extended time and frequent mod to max A, including when she added the same coins several times.      Assessment / Recommendations / Plan   Plan Continue with current plan of care      Progression Toward Goals   Progression toward goals Not progressing toward goals (comment)   carryover with family             SLP Short Term Goals - 06/13/20 1400      SLP SHORT TERM GOAL #1   Title Pt wil recall and request am meds with occasional min A from caregivers over 3 sessions    Status Achieved      SLP SHORT TERM GOAL #2   Title Pt and family will carryover 3 compensations for auditory comprehension so pt can accurately follow directions and understand daily plan    Status Partially Met      SLP SHORT  TERM GOAL #3   Title Pt will be oriented to day and time as measured by her verbalizing times she needs to get dressed and leave for her appointments with compensatory strategies and cues from family    Baseline 03/17/20; 03/19/20    Status Achieved      SLP SHORT TERM GOAL #4   Title Complete CLQT    Status Deferred            SLP Long Term Goals - 06/11/20 2354      SLP LONG TERM GOAL #1   Title Pt will use external aids to be oriented to date, time and daily plan with rare min A from family    Status Achieved   list of appointmetns and dates     SLP LONG TERM GOAL #2   Title Pt will get ready for appointments and daily tasks in timely manner with occasional min A from family    Time 1    Period Weeks    Status On-going      SLP LONG TERM GOAL #3   Title Pt will ID and correct language errors in converation with occasional min A from family over 2 sessions    Time 2    Period Weeks    Status Achieved            Plan - 06/13/20 1400     Clinical Impression Statement [name] is referred for outpt ST s/p SDH/craniotomy due to ongoing cognitive communication impairments.  See 'skilled intervention" for further details on today's session.  I recommend cont'd skilled ST to maximize cognitive linguistic skills for safety, QOL and to reduce caregiver burden. Pt agrees that one- two more sessions are good until d/c.    Speech Therapy Frequency 2x / week    Duration 8 weeks   17 visists   Treatment/Interventions SLP instruction and feedback;Compensatory strategies;Functional tasks;Cognitive reorganization;Compensatory techniques;Cueing hierarchy;Environmental controls;Patient/family education;Multimodal communcation approach;Internal/external aids    Potential to Achieve Goals Good           Patient will benefit from skilled therapeutic intervention in order to improve the following deficits and impairments:   Cognitive communication deficit    Problem List Patient Active Problem List   Diagnosis Date Noted  . Vitamin D deficiency 04/24/2020  . Eye pain 03/13/2020  . Hyperkalemia 01/14/2020  . Chronic RUQ pain 01/14/2020  . Sensation of fullness in ear, bilateral 01/11/2020  . 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 Hyperlipemia 08/23/2019  . ICH (intracerebral hemorrhage) (HCC) L parietal w/ L SDH s/p crani 08/09/2019  . Subdural hematoma, acute ([facility]) 08/09/2019  . Healthcare maintenance 06/20/2019  . History of strabismus surgery 03/31/2019  . Moderate, recurrent MDD 06/06/2018  . 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 Osteoarthritis of right knee 06/05/2018  . Essential hypertension 06/05/2018  . Anxiety 06/05/2018  . Age-related nuclear cataract of both eyes 11/26/2014  . Diplopia 11/26/2014  . Monocular esotropia of left eye 11/26/2014  . Corneal transplant failure 10/27/2011    Michelle Carrillo, Michelle Carrillo, CCC-SLP 06/13/2020, 2:04 PM  [facility] [address] [place], [place], [address] Phone: [phone]   Fax:  [phone]   Name: Michelle Carrillo MRN: [id number] Date of Birth: [birth year]

## 2020-06-17 ENCOUNTER — Ambulatory Visit: Payer: Medicare Other

## 2020-06-18 ENCOUNTER — Ambulatory Visit: Payer: Medicare Other | Admitting: Occupational Therapy

## 2020-06-18 ENCOUNTER — Encounter: Payer: Self-pay | Admitting: Occupational Therapy

## 2020-06-18 ENCOUNTER — Other Ambulatory Visit: Payer: Self-pay

## 2020-06-18 ENCOUNTER — Ambulatory Visit: Payer: Medicare Other

## 2020-06-18 DIAGNOSIS — R278 Other lack of coordination: Secondary | ICD-10-CM | POA: Diagnosis not present

## 2020-06-18 DIAGNOSIS — H539 Unspecified visual disturbance: Secondary | ICD-10-CM

## 2020-06-18 DIAGNOSIS — M6281 Muscle weakness (generalized): Secondary | ICD-10-CM

## 2020-06-18 DIAGNOSIS — I69398 Other sequelae of cerebral infarction: Secondary | ICD-10-CM | POA: Diagnosis not present

## 2020-06-18 DIAGNOSIS — R41841 Cognitive communication deficit: Secondary | ICD-10-CM | POA: Diagnosis not present

## 2020-06-18 DIAGNOSIS — R41840 Attention and concentration deficit: Secondary | ICD-10-CM | POA: Diagnosis not present

## 2020-06-18 DIAGNOSIS — R41842 Visuospatial deficit: Secondary | ICD-10-CM | POA: Diagnosis not present

## 2020-06-18 DIAGNOSIS — R2689 Other abnormalities of gait and mobility: Secondary | ICD-10-CM

## 2020-06-18 DIAGNOSIS — R41844 Frontal lobe and executive function deficit: Secondary | ICD-10-CM

## 2020-06-18 DIAGNOSIS — R2681 Unsteadiness on feet: Secondary | ICD-10-CM

## 2020-06-18 NOTE — Therapy (Signed)
[facility] [address] [place], [place], [address] Phone: [phone]   Fax:  [phone]  Speech Language Pathology Treatment/discharge summary  Patient Details  Name: Michelle Carrillo MRN: [id number] Date of Birth: 03-Mar-1951 Referring Provider (SLP): Frann Rider, NP   Encounter Date: 06/18/2020   End of Session - 06/18/20 0839    Visit Number 17    Number of Visits 17    Date for SLP Re-Evaluation 06/18/20    SLP Start Time 0808   pt 7 minutes late   SLP Stop Time  0845    SLP Time Calculation (min) 37 min    Activity Tolerance Patient tolerated treatment well           Past Medical History:  Diagnosis Date  . Anxiety    on meds  . Arthritis    bilateral knees  . Cataract    RIGHT eye  . Depression    on meds  . Herpes zoster kerstitis s/p corneal transplant 2015   . Hypertension    on meds  . Right knee osteoarthritis   . Strabismus    due to eye injury as a child  . Stroke [facility]) 07/2019   stroke with craniotomy    Past Surgical History:  Procedure Laterality Date  . CESAREAN SECTION      x 2   . CHOLECYSTECTOMY    . COLONOSCOPY  04/22/2020  . CORNEAL TRANSPLANT Left    2006 and 2016  . CRANIOTOMY Left 08/09/2019   Procedure: CRANIOTOMY HEMATOMA EVACUATION SUBDURAL;  Surgeon: Ashok Pall, MD;  Location: [facility];  Service: Neurosurgery;  Laterality: Left;  left    There were no vitals filed for this visit.   SPEECH THERAPY DISCHARGE SUMMARY  Visits from Start of Care: 17  Current functional level related to goals / functional outcomes: Pt and daughter both agree pt's attention, executive function, problem solving have all improved since pt's evaluation. While not at baseline, pt is performing well/is functional with compensatory strategies. ST was made slightly more challenging with pt's level of literacy.   Remaining deficits: Mild cognitive deficits.   Education /  Equipment: Attention compensations, compensations for cognitive deficits        Plan: Patient agrees to discharge.  Patient goals were partially met. Patient is being discharged due to                                                    [facility] rehab potential at this time.   ?????   Subjective Assessment - 06/18/20 6144    Subjective [name] here with pt today. Pt gives permission for [name] to translate.    Currently in Pain? No/denies                 ADULT SLP TREATMENT - 06/18/20 0813      General Information   Behavior/Cognition Alert;Cooperative;Pleasant mood      Treatment Provided   Treatment provided Cognitive-Linquistic      Cognitive-Linquistic Treatment   Treatment focused on Cognition;Patient/family/caregiver education    Skilled Treatment SLP notes pt anticipatory awareness WNL for making leftovers when she makes meals as shared during last session. Pt relates to SLP upon direct questioning that her understanding of her schedule is better than at the initiation of therapy, with pt now  using compensatory techniques. She tells SLP today that she is doing much of what she did prior to the CVA but she is doing it slower than premorbidly. Pt reports that it is very difficult for her to read numbers over the phone at this time - daughter states this is a visual attention issue and OT is addressing this with patient. SLP reinforced that it is good to practice and that it is good to block out all numbers except the ones she wants to communicate. [name] stated she is comfortable at this time helping pt with money having seen SLP do this with pt multiple times - SLP encouraged pt that the daugher who lives with pt could also assist, or practice over facetime could also occur. SLP informed pt and daughter that sometimes pt's return for a short course of therapy 6-12 months after d/c - 4 weeks for 1-2 times a week as a "refresher".      Assessment / Recommendations / Plan   Plan  Discharge SLP treatment due to (comment)   pt has reached end of tx course     Progression Toward Goals   Progression toward goals --   d/c day - see goals             SLP Short Term Goals - 06/13/20 1400      SLP SHORT TERM GOAL #1   Title Pt wil recall and request am meds with occasional min A from caregivers over 3 sessions    Status Achieved      SLP SHORT TERM GOAL #2   Title Pt and family will carryover 3 compensations for auditory comprehension so pt can accurately follow directions and understand daily plan    Status Partially Met      SLP SHORT TERM GOAL #3   Title Pt will be oriented to day and time as measured by her verbalizing times she needs to get dressed and leave for her appointments with compensatory strategies and cues from family    Baseline 03/17/20; 03/19/20    Status Achieved      SLP SHORT TERM GOAL #4   Title Complete CLQT    Status Deferred            SLP Long Term Goals - 06/18/20 0850      SLP LONG TERM GOAL #1   Title Pt will use external aids to be oriented to date, time and daily plan with rare min A from family    Status Achieved   list of appointmetns and dates     SLP LONG TERM GOAL #2   Title Pt will get ready for appointments and daily tasks in timely manner with occasional min A from family    Status On-going      SLP LONG TERM GOAL #3   Title Pt will ID and correct language errors in converation with occasional min A from family over 2 sessions    Status Achieved            Plan - 06/18/20 0849    Clinical Impression Statement Michelle Carrillo agrees with d/c today - see d/c summary for details of this therapy course. See 'skilled intervention" for further details on today's session.    Duration --   17 visists   Treatment/Interventions SLP instruction and feedback;Compensatory strategies;Functional tasks;Cognitive reorganization;Compensatory techniques;Cueing hierarchy;Environmental controls;Patient/family  education;Multimodal communcation approach;Internal/external aids    Potential to Achieve Goals Good  Patient will benefit from skilled therapeutic intervention in order to improve the following deficits and impairments:   Cognitive communication deficit    Problem List Patient Active Problem List   Diagnosis Date Noted  . Vitamin D deficiency 04/24/2020  . Eye pain 03/13/2020  . Hyperkalemia 01/14/2020  . Chronic RUQ pain 01/14/2020  . Sensation of fullness in ear, bilateral 01/11/2020  . 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 Hyperlipemia 08/23/2019  . ICH (intracerebral hemorrhage) (HCC) L parietal w/ L SDH s/p crani 08/09/2019  . Subdural hematoma, acute ([facility]) 08/09/2019  . Healthcare maintenance 06/20/2019  . History of strabismus surgery 03/31/2019  . Moderate, recurrent MDD 06/06/2018  . 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 Osteoarthritis of right knee 06/05/2018  . Essential hypertension 06/05/2018  . Anxiety 06/05/2018  . Age-related nuclear cataract of both eyes 11/26/2014  . Diplopia 11/26/2014  . Monocular esotropia of left eye 11/26/2014  . Corneal transplant failure 10/27/2011    [facility] 06/18/2020, 8:51 AM  [facility] [address] [place], [place], [address] Phone: [phone]   Fax:  [phone]   Name: Michelle Carrillo MRN: [id number] Date of Birth: 10-13-1950

## 2020-06-18 NOTE — Addendum Note (Signed)
Addended by: Garald Balding B on: 06/18/2020 12:32 PM   Modules accepted: Orders

## 2020-06-18 NOTE — Therapy (Signed)
[facility] [address] [place], [place], [address] Phone: [phone]   Fax:  [phone]  Occupational Therapy Treatment  Patient Details  Name: Michelle Carrillo MRN: [id number] Date of Birth: [birth year] Referring Provider (OT): Frann Rider, NP   Encounter Date: 06/18/2020   OT End of Session - 06/18/20 0851    Visit Number 11    Number of Visits 17    Authorization Type Medicare    Authorization - Visit Number 11    Authorization - Number of Visits 20    OT Start Time 0850    OT Stop Time 0930    OT Time Calculation (min) 40 min           Past Medical History:  Diagnosis Date  . Anxiety    on meds  . Arthritis    bilateral knees  . Cataract    RIGHT eye  . Depression    on meds  . Herpes zoster kerstitis s/p corneal transplant 2015   . Hypertension    on meds  . Right knee osteoarthritis   . Strabismus    due to eye injury as a child  . Stroke [facility]) 07/2019   stroke with craniotomy    Past Surgical History:  Procedure Laterality Date  . CESAREAN SECTION      x 2   . CHOLECYSTECTOMY    . COLONOSCOPY  04/22/2020  . CORNEAL TRANSPLANT Left    2006 and 2016  . CRANIOTOMY Left 08/09/2019   Procedure: CRANIOTOMY HEMATOMA EVACUATION SUBDURAL;  Surgeon: Ashok Pall, MD;  Location: [facility];  Service: Neurosurgery;  Laterality: Left;  left    There were no vitals filed for this visit.   Subjective Assessment - 06/18/20 0851    Subjective  Pt denies pain    Pertinent History PMH: HTN, Anxiety, Depression, OA, Strabismus   Presented to ED on 08/09/19 with altered mental status. CT scan of the head showed a large moderate size cortically based hematoma in the left parietal region with extension into the subdural space resulting in a fair sized subdural hematoma with left-to-right midline shift. Patient recieved inpatient rehab services and was discharged home    Patient Stated Goals I want to get  better and better               Treatment: Therapist checked progress towards goals in preparation for renewal. Environmental scanning 62% on first pass today , remaining items located in 2 passes with min v.c. Pt continues to demonstrate significant left visual field deficit.  Tabletop scanning number cancellation 90% today. Pt reports she has not been performing HEP, she needs reinforcement/ updates.                   OT Short Term Goals - 06/18/20 9833      OT SHORT TERM GOAL #1   Title I with inital HEP.    Time 4    Status Not Met   not performing any activities consistently   Target Date 04/23/20      OT SHORT TERM GOAL #2   Title Pt/ caregiever will verbalize understanding of compensatory strategies for visual deficits.    Time 4    Period Weeks    Status Achieved   Discussed strategies with pt/ dtr.     OT SHORT TERM GOAL #3   Title Pt will demonstrate ability to sequence a simple functional task/ home management task with no more than  min v.c    Time 4    Period Weeks    Status Achieved   met per pt family for simple cooking at home     OT SHORT TERM GOAL #4   Title Pt will perform environmental scanning with 80% or better accuracy in a min distracting envireonment.    Time 4    Period Weeks    Status Achieved   100% today     OT SHORT TERM GOAL #5   Title Pt will complete table top scanning tasks with 80% accuracy or better    Time 4    Period Weeks    Status Achieved   not consistent            OT Long Term Goals - 06/18/20 0855      OT LONG TERM GOAL #1   Title Pt will complete a simple cooking task including locating items in a resonable amount of time modified independently.    Status Achieved   Pt and dtr report she is performing at home     OT LONG TERM GOAL #2   Title Pt. will demonstrate improved bilateral UE coordination as evidenced by decreasing 9 hole peg test score by 5 secs.    Status Achieved   RUE 32.65, LUE 27.72      OT LONG TERM GOAL #3   Title Pt will complete tabletop scanning activities with 90% or better accuracy.    Time 4    Status Achieved   90% for number cancellation today.     OT LONG TERM GOAL #4   Title Pt will locate items in a mod distracting environment with 90% or better accuracy.    Status On-going   62% on first pass     OT LONG TERM GOAL #5   Title Pt/ family will I with HEP for functional activities    Time 4    Period Weeks    Status New      OT LONG TERM GOAL #6   Title Pt will utilize visual compensation strategies follow review with min v.c    Time 4    Period Weeks    Status New                 Plan - 06/18/20 0910    Clinical Impression Statement Pt returns to occupational therapy following a break. (Pt cancelled her last previous OT visit and she did not schedule additional visits). Pt can benefit from a few additional OT visits to update HEP for functional activities and to reinforce visual compensation strategies. Pt/ dtr arein agreement.    OT Occupational Profile and History Detailed Assessment- Review of Records and additional review of physical, cognitive, psychosocial history related to current functional performance    Occupational performance deficits (Please refer to evaluation for details): ADL's;IADL's;Leisure;Social Participation    Body Structure / Function / Physical Skills ADL;Balance;Mobility;Strength;UE functional use;FMC;Gait;Coordination;Decreased knowledge of precautions;GMC;ROM;IADL;Dexterity;Decreased knowledge of use of DME    Cognitive Skills Attention;Memory;Problem Solve;Safety Awareness;Sequencing;Thought;Understand    Rehab Potential Good    Clinical Decision Making Several treatment options, min-mod task modification necessary    Comorbidities Affecting Occupational Performance: May have comorbidities impacting occupational performance    Modification or Assistance to Complete Evaluation  Min-Moderate modification of tasks or  assist with assess necessary to complete eval    OT Frequency 2x / week   anticipate d/c after 2 weeks   OT Duration 4 weeks    OT Treatment/Interventions  Self-care/ADL training;Therapeutic exercise;Balance training;Aquatic Therapy;Ultrasound;Neuromuscular education;Manual Therapy;Splinting;Therapeutic activities;Cryotherapy;Paraffin;DME and/or AE instruction;Cognitive remediation/compensation;Visual/perceptual remediation/compensation;Fluidtherapy;Gait Training;Moist Heat;Contrast Bath;Passive range of motion;Patient/family education    Plan update functional activities HEP, reinforce visual compensation strategies, pt is scheduled for 3 additional visits    Consulted and Agree with Plan of Care Patient;Family member/caregiver    Family Member Consulted dtr who interprets for her           Patient will benefit from skilled therapeutic intervention in order to improve the following deficits and impairments:   Body Structure / Function / Physical Skills: ADL,Balance,Mobility,Strength,UE functional use,FMC,Gait,Coordination,Decreased knowledge of precautions,GMC,ROM,IADL,Dexterity,Decreased knowledge of use of DME Cognitive Skills: Attention,Memory,Problem Solve,Safety Awareness,Sequencing,Thought,Understand     Visit Diagnosis: Muscle weakness (generalized)  Visuospatial deficit  Attention and concentration deficit  Visual disturbance as complication of stroke  Other lack of coordination  Frontal lobe and executive function deficit  Other abnormalities of gait and mobility  Unsteadiness on feet    Problem List Patient Active Problem List   Diagnosis Date Noted  . Vitamin D deficiency 04/24/2020  . Eye pain 03/13/2020  . Hyperkalemia 01/14/2020  . Chronic RUQ pain 01/14/2020  . Sensation of fullness in ear, bilateral 01/11/2020  . Aphasia due to acute stroke ([facility]) 08/23/2019  . Hyperlipemia 08/23/2019  . ICH (intracerebral hemorrhage) (HCC) L parietal w/ L SDH s/p crani  08/09/2019  . Subdural hematoma, acute ([facility]) 08/09/2019  . Healthcare maintenance 06/20/2019  . History of strabismus surgery 03/31/2019  . Moderate, recurrent MDD 06/06/2018  . 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 Osteoarthritis of right knee 06/05/2018  . Essential hypertension 06/05/2018  . Anxiety 06/05/2018  . Age-related nuclear cataract of both eyes 11/26/2014  . Diplopia 11/26/2014  . Monocular esotropia of left eye 11/26/2014  . Corneal transplant failure 10/27/2011    Jeda Pardue 06/18/2020, 1:11 PM Theone Murdoch, OTR/L [phone] Phone: [phone] 1:14 PM 06/18/20 [facility] [address] [place], [place], [address] Phone: [phone]   Fax:  [phone]  Name: KEERTHANA VANROSSUM MRN: [id number] Date of Birth: 1951-02-12

## 2020-06-19 ENCOUNTER — Ambulatory Visit: Payer: Medicare Other

## 2020-06-19 ENCOUNTER — Ambulatory Visit: Payer: Medicare Other | Admitting: Occupational Therapy

## 2020-06-19 ENCOUNTER — Encounter: Payer: Self-pay | Admitting: Occupational Therapy

## 2020-06-19 DIAGNOSIS — R41842 Visuospatial deficit: Secondary | ICD-10-CM

## 2020-06-19 DIAGNOSIS — M6281 Muscle weakness (generalized): Secondary | ICD-10-CM

## 2020-06-19 DIAGNOSIS — R278 Other lack of coordination: Secondary | ICD-10-CM

## 2020-06-19 DIAGNOSIS — R41840 Attention and concentration deficit: Secondary | ICD-10-CM | POA: Diagnosis not present

## 2020-06-19 DIAGNOSIS — H539 Unspecified visual disturbance: Secondary | ICD-10-CM | POA: Diagnosis not present

## 2020-06-19 DIAGNOSIS — I69398 Other sequelae of cerebral infarction: Secondary | ICD-10-CM | POA: Diagnosis not present

## 2020-06-19 DIAGNOSIS — R41841 Cognitive communication deficit: Secondary | ICD-10-CM | POA: Diagnosis not present

## 2020-06-19 NOTE — Therapy (Signed)
[facility] [address] [place], [place], [address] Phone: [phone]   Fax:  [phone]  Occupational Therapy Treatment  Patient Details  Name: Michelle Carrillo MRN: [id number] Date of Birth: May 05, 1950 Referring Provider (OT): Frann Rider, NP   Encounter Date: 06/19/2020   OT End of Session - 06/19/20 1848    Visit Number 12    Number of Visits 17    Authorization Type Medicare    Authorization - Visit Number 12    Authorization - Number of Visits 20    OT Start Time 1700    OT Stop Time 1745    OT Time Calculation (min) 45 min    Activity Tolerance Patient tolerated treatment well    Behavior During Therapy [facility] for tasks assessed/performed           Past Medical History:  Diagnosis Date  . Anxiety    on meds  . Arthritis    bilateral knees  . Cataract    RIGHT eye  . Depression    on meds  . Herpes zoster kerstitis s/p corneal transplant 2015   . Hypertension    on meds  . Right knee osteoarthritis   . Strabismus    due to eye injury as a child  . Stroke [facility]) 07/2019   stroke with craniotomy    Past Surgical History:  Procedure Laterality Date  . CESAREAN SECTION      x 2   . CHOLECYSTECTOMY    . COLONOSCOPY  04/22/2020  . CORNEAL TRANSPLANT Left    2006 and 2016  . CRANIOTOMY Left 08/09/2019   Procedure: CRANIOTOMY HEMATOMA EVACUATION SUBDURAL;  Surgeon: Ashok Pall, MD;  Location: [facility];  Service: Neurosurgery;  Laterality: Left;  left    There were no vitals filed for this visit.   Subjective Assessment - 06/19/20 1844    Subjective  Patient's daughter indicates that patient bumps into things with grocery cart at the store    Patient is accompanied by: Interpreter    Pertinent History PMH: HTN, Anxiety, Depression, OA, Strabismus   Presented to ED on 08/09/19 with altered mental status. CT scan of the head showed a large moderate size cortically based hematoma in the left  parietal region with extension into the subdural space resulting in a fair sized subdural hematoma with left-to-right midline shift. Patient recieved inpatient rehab services and was discharged home    Patient Stated Goals I want to get better and better    Currently in Pain? No/denies    Pain Score 0-No pain                        OT Treatments/Exercises (OP) - 06/19/20 0001      Visual/Perceptual Exercises   Other Exercises Patient with significant left visual field cut, and also cognitive impairments of sustained attention.  Worked on tabletop and environmental scanning today.  Patient having difficulty writing digit sequence - often reversing numbers and unable to ID or correct errors.  Patient taught to break 7 digit number into smaller segments and repeat out loud to reinforce immediate recall.  Patient had difficulty writing on line , numbers often falling below line - daughter indcates that she has noticed this when patient signing name.  Patient did betterin functional setting where she could environmentally scan to identify things that were out of place in kitchen setting.  Discuused importance of overtly scanning and slowing down to help  avoid obstacles, and to decrease errors.                  OT Education - 06/19/20 1848    Education Details over scanning to left in environment    Person(s) Educated Patient;Child(ren)    Methods Explanation;Demonstration    Comprehension Verbalized understanding;Need further instruction            OT Short Term Goals - 06/19/20 1849      OT SHORT TERM GOAL #1   Title I with inital HEP.    Time 4    Status Not Met   not performing any activities consistently   Target Date 04/23/20      OT SHORT TERM GOAL #2   Title Pt/ caregiever will verbalize understanding of compensatory strategies for visual deficits.    Time 4    Period Weeks    Status Achieved   Discussed strategies with pt/ dtr.     OT SHORT TERM GOAL #3    Title Pt will demonstrate ability to sequence a simple functional task/ home management task with no more than min v.c    Time 4    Period Weeks    Status Achieved   met per pt family for simple cooking at home     OT SHORT TERM GOAL #4   Title Pt will perform environmental scanning with 80% or better accuracy in a min distracting envireonment.    Time 4    Period Weeks    Status Achieved   100% today     OT SHORT TERM GOAL #5   Title Pt will complete table top scanning tasks with 80% accuracy or better    Time 4    Period Weeks    Status Achieved   not consistent            OT Long Term Goals - 06/19/20 1849      OT LONG TERM GOAL #1   Title Pt will complete a simple cooking task including locating items in a resonable amount of time modified independently.    Status Achieved   Pt and dtr report she is performing at home     OT LONG TERM GOAL #2   Title Pt. will demonstrate improved bilateral UE coordination as evidenced by decreasing 9 hole peg test score by 5 secs.    Status Achieved   RUE 32.65, LUE 27.72     OT LONG TERM GOAL #3   Title Pt will complete tabletop scanning activities with 90% or better accuracy.    Time 4    Status Achieved   90% for number cancellation today.     OT LONG TERM GOAL #4   Title Pt will locate items in a mod distracting environment with 90% or better accuracy.    Status On-going   62% on first pass     OT LONG TERM GOAL #5   Title Pt/ family will I with HEP for functional activities    Time 4    Period Weeks    Status New      OT LONG TERM GOAL #6   Title Pt will utilize visual compensation strategies following review with min v.c    Time 4    Period Weeks    Status New                 Plan - 06/19/20 1849    Clinical Impression Statement Patient continues with significant visual deficits,  and intermittent ability to compensate.    OT Occupational Profile and History Detailed Assessment- Review of Records and  additional review of physical, cognitive, psychosocial history related to current functional performance    Occupational performance deficits (Please refer to evaluation for details): ADL's;IADL's;Leisure;Social Participation    Body Structure / Function / Physical Skills ADL;Balance;Mobility;Strength;UE functional use;FMC;Gait;Coordination;Decreased knowledge of precautions;GMC;ROM;IADL;Dexterity;Decreased knowledge of use of DME    Cognitive Skills Attention;Memory;Problem Solve;Safety Awareness;Sequencing;Thought;Understand    Rehab Potential Good    Clinical Decision Making Several treatment options, min-mod task modification necessary    Comorbidities Affecting Occupational Performance: May have comorbidities impacting occupational performance    Modification or Assistance to Complete Evaluation  Min-Moderate modification of tasks or assist with assess necessary to complete eval    OT Frequency 2x / week   anticipate d/c after 2 weeks   OT Duration 4 weeks    OT Treatment/Interventions Self-care/ADL training;Therapeutic exercise;Balance training;Aquatic Therapy;Ultrasound;Neuromuscular education;Manual Therapy;Splinting;Therapeutic activities;Cryotherapy;Paraffin;DME and/or AE instruction;Cognitive remediation/compensation;Visual/perceptual remediation/compensation;Fluidtherapy;Gait Training;Moist Heat;Contrast Bath;Passive range of motion;Patient/family education    Plan update functional activities HEP, reinforce visual compensation strategies, pt is scheduled for 3 additional visits    Consulted and Agree with Plan of Care Patient;Family member/caregiver    Family Member Consulted dtr who interprets for her           Patient will benefit from skilled therapeutic intervention in order to improve the following deficits and impairments:   Body Structure / Function / Physical Skills: ADL,Balance,Mobility,Strength,UE functional use,FMC,Gait,Coordination,Decreased knowledge of  precautions,GMC,ROM,IADL,Dexterity,Decreased knowledge of use of DME Cognitive Skills: Attention,Memory,Problem Solve,Safety Awareness,Sequencing,Thought,Understand     Visit Diagnosis: Muscle weakness (generalized)  Visuospatial deficit  Attention and concentration deficit  Visual disturbance as complication of stroke  Other lack of coordination    Problem List Patient Active Problem List   Diagnosis Date Noted  . Vitamin D deficiency 04/24/2020  . Eye pain 03/13/2020  . Hyperkalemia 01/14/2020  . Chronic RUQ pain 01/14/2020  . Sensation of fullness in ear, bilateral 01/11/2020  . Aphasia due to acute stroke ([facility]) 08/23/2019  . Hyperlipemia 08/23/2019  . ICH (intracerebral hemorrhage) (HCC) L parietal w/ L SDH s/p crani 08/09/2019  . Subdural hematoma, acute ([facility]) 08/09/2019  . Healthcare maintenance 06/20/2019  . History of strabismus surgery 03/31/2019  . Moderate, recurrent MDD 06/06/2018  . 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 Osteoarthritis of right knee 06/05/2018  . Essential hypertension 06/05/2018  . Anxiety 06/05/2018  . Age-related nuclear cataract of both eyes 11/26/2014  . Diplopia 11/26/2014  . Monocular esotropia of left eye 11/26/2014  . Corneal transplant failure 10/27/2011    Mariah Milling, OTR/L 06/19/2020, 6:50 PM  [facility] [address] [place], [place], [address] Phone: [phone]   Fax:  [phone]  Name: Michelle Carrillo MRN: [id number] Date of Birth: 08/08/1950

## 2020-06-25 ENCOUNTER — Ambulatory Visit: Payer: Medicare Other | Admitting: Occupational Therapy

## 2020-06-26 ENCOUNTER — Ambulatory Visit: Payer: Medicare Other | Admitting: Occupational Therapy

## 2020-07-01 ENCOUNTER — Encounter: Payer: Self-pay | Admitting: *Deleted

## 2020-07-01 NOTE — Progress Notes (Signed)

## 2020-07-02 ENCOUNTER — Other Ambulatory Visit: Payer: Self-pay

## 2020-07-02 ENCOUNTER — Encounter: Payer: Self-pay | Admitting: Occupational Therapy

## 2020-07-02 ENCOUNTER — Ambulatory Visit: Payer: Medicare Other | Admitting: Occupational Therapy

## 2020-07-02 DIAGNOSIS — M6281 Muscle weakness (generalized): Secondary | ICD-10-CM

## 2020-07-02 DIAGNOSIS — H539 Unspecified visual disturbance: Secondary | ICD-10-CM | POA: Diagnosis not present

## 2020-07-02 DIAGNOSIS — R41840 Attention and concentration deficit: Secondary | ICD-10-CM | POA: Diagnosis not present

## 2020-07-02 DIAGNOSIS — I69398 Other sequelae of cerebral infarction: Secondary | ICD-10-CM

## 2020-07-02 DIAGNOSIS — R278 Other lack of coordination: Secondary | ICD-10-CM

## 2020-07-02 DIAGNOSIS — R41841 Cognitive communication deficit: Secondary | ICD-10-CM | POA: Diagnosis not present

## 2020-07-02 DIAGNOSIS — R41842 Visuospatial deficit: Secondary | ICD-10-CM

## 2020-07-02 NOTE — Progress Notes (Signed)
Things That May Be Affecting Your Health:  Alcohol  Hearing loss  Pain   + Depression  Home Safety  Sexual Health   Diabetes  Lack of physical activity  Stress  + Difficulty with daily activities  Loneliness  Tiredness   Drug use + Medicines  Tobacco use  + Falls  Motor Vehicle Safety  Weight   Food choices  Oral Health  Other    YOUR PERSONALIZED HEALTH PLAN : 1. Schedule your next subsequent Medicare Wellness visit in one year 2. Attend all of your regular appointments to address your medical issues 3. Complete the preventative screenings and services   Annual Wellness Visit   Medicare Covered Preventative Screenings and [place] Men and Women Who How Often Need? Date of Last Service Action  Abdominal Aortic Aneurysm Adults with AAA risk factors Once      Alcohol Misuse and Counseling All Adults Screening once a year if no alcohol misuse. Counseling up to 4 face to face sessions.     Bone Density Measurement  Adults at risk for osteoporosis Once every 2 yrs      Lipid Panel Z13.6 All adults without CV disease Once every 5 yrs       Colorectal Cancer   Stool sample or  Colonoscopy All adults 61 and older   Once every year  Every 10 years        Depression All Adults Once a year  Today   Diabetes Screening Blood glucose, post glucose load, or GTT Z13.1  All adults at risk  Pre-diabetics  Once per year  Twice per year      Diabetes  Self-Management Training All adults Diabetics 10 hrs first year; 2 hours subsequent years. Requires Copay     Glaucoma  Diabetics  Family history of glaucoma  African Americans 79 yrs +  Hispanic Americans 96 yrs + Annually - requires coppay      Hepatitis C Z72.89 or F19.20  High Risk for HCV  Born between 1945 and 1965  Annually  Once      HIV Z11.4 All adults based on risk  Annually btw ages 12 & 47 regardless of risk  Annually > 65 yrs if at increased risk      Lung Cancer Screening  Asymptomatic adults aged 2-77 with 30 pack yr history and current smoker OR quit within the last 15 yrs Annually Must have counseling and shared decision making documentation before first screen      Medical Nutrition Therapy Adults with   Diabetes  Renal disease  Kidney transplant within past 3 yrs 3 hours first year; 2 hours subsequent years     Obesity and Counseling All adults Screening once a year Counseling if BMI 30 or higher  Today   Tobacco Use Counseling Adults who use tobacco  Up to 8 visits in one year     Vaccines Z23  Hepatitis B  Influenza   Pneumonia  Adults   Once  Once every flu season  Two different vaccines separated by one year     Next Annual Wellness Visit People with Medicare Every year  Today     Services & Screenings Women Who How Often Need  Date of Last Service Action  Mammogram  Z12.31 Women over 46 One baseline ages 25-39. Annually ager 40 yrs+      Pap tests All women Annually if high risk. Every 2 yrs for normal risk women  Screening for cervical cancer with   Pap (Z01.419 nl or Z01.411abnl) &  HPV Z11.51 Women aged 82 to 62 Once every 5 yrs     Screening pelvic and breast exams All women Annually if high risk. Every 2 yrs for normal risk women     Sexually Transmitted Diseases  Chlamydia  Gonorrhea  Syphilis All at risk adults Annually for non pregnant females at increased risk         [place] Men Who How Ofter Need  Date of Last Service Action  Prostate Cancer - DRE & PSA Men over 50 Annually.  DRE might require a copay.        Sexually Transmitted Diseases  Syphilis All at risk adults Annually for men at increased risk      Health Maintenance List Health Maintenance  Topic Date Due  . Hepatitis C Screening  Never done  . TETANUS/TDAP  Never done  . MAMMOGRAM  05/31/2010  . DEXA SCAN  Never done  . COVID-19 Vaccine (3 - Booster for Moderna series) 01/16/2020  . PNA vac Low Risk Adult (2 of 2  - PPSV23) 04/23/2021  . COLONOSCOPY (Pts 45-51yrs Insurance coverage will need to be confirmed)  04/23/2023  . INFLUENZA VACCINE  Completed  . HPV VACCINES  Aged Out   Covid Vaccine  Hep C screening

## 2020-07-02 NOTE — Therapy (Signed)
[facility] [address] [place], [place], [address] Phone: [phone]   Fax:  [phone]  Occupational Therapy Treatment  Patient Details  Name: Michelle Carrillo MRN: [id number] Date of Birth: 12/04/1950 Referring Provider (OT): Jessica McCue, NP   Encounter Date: 07/02/2020   OT End of Session - 07/02/20 1255    Visit Number 13    Number of Visits 17    Date for OT Re-Evaluation 05/21/20    Authorization Type Medicare    Authorization - Visit Number 13    Authorization - Number of Visits 20    OT Start Time 1200    OT Stop Time 1240    OT Time Calculation (min) 40 min    Activity Tolerance Patient tolerated treatment well    Behavior During Therapy WFL for tasks assessed/performed           Past Medical History:  Diagnosis Date  . Anxiety    on meds  . Arthritis    bilateral knees  . Cataract    RIGHT eye  . Depression    on meds  . Herpes zoster kerstitis s/p corneal transplant 2015   . Hypertension    on meds  . Right knee osteoarthritis   . Strabismus    due to eye injury as a child  . Stroke (HCC) 07/2019   stroke with craniotomy    Past Surgical History:  Procedure Laterality Date  . CESAREAN SECTION      x 2   . CHOLECYSTECTOMY    . COLONOSCOPY  04/22/2020  . CORNEAL TRANSPLANT Left    2006 and 2016  . CRANIOTOMY Left 08/09/2019   Procedure: CRANIOTOMY HEMATOMA EVACUATION SUBDURAL;  Surgeon: Cabbell, Kyle, MD;  Location: [facility] OR;  Service: Neurosurgery;  Laterality: Left;  left    There were no vitals filed for this visit.   Subjective Assessment - 07/02/20 1244    Subjective  Patient brought homework back - completed    Pertinent History PMH: HTN, Anxiety, Depression, OA, Strabismus   Presented to ED on 08/09/19 with altered mental status. CT scan of the head showed a large moderate size cortically based hematoma in the left parietal region with extension into the subdural  space resulting in a fair sized subdural hematoma with left-to-right midline shift. Patient recieved inpatient rehab services and was discharged home    Patient Stated Goals I want to get better and better    Currently in Pain? No/denies    Pain Score 0-No pain                        OT Treatments/Exercises (OP) - 07/02/20 0001      Visual/Perceptual Exercises   Other Exercises Patient continues with difficulty compensating for visual field cut with novel tasks.  e.g. today we tried spanish simple word search puzzle.   Patient does better with familiar functional tasks where she can pull from past experience.  Patient is cooking at home and per family this has been going reasonably well.  Patient is frustrated with herself - feels she has more energy in the clinic environment than at home - reports not sleeping well recently.                  OT Education - 07/02/20 1254    Education Details encouraged to over scan to left, and to move slowly, do work slowly to reduce errors    Person(s)   Educated Patient;Other (comment)   interpreter Verdis Frederickson   Methods Explanation    Comprehension Need further instruction            OT Short Term Goals - 07/02/20 1256      OT SHORT TERM GOAL #1   Title I with inital HEP.    Time 4    Status Not Met   not performing any activities consistently   Target Date 04/23/20      OT SHORT TERM GOAL #2   Title Pt/ caregiever will verbalize understanding of compensatory strategies for visual deficits.    Time 4    Period Weeks    Status Achieved   Discussed strategies with pt/ dtr.     OT SHORT TERM GOAL #3   Title Pt will demonstrate ability to sequence a simple functional task/ home management task with no more than min v.c    Time 4    Period Weeks    Status Achieved   met per pt family for simple cooking at home     OT SHORT TERM GOAL #4   Title Pt will perform environmental scanning with 80% or better accuracy in a min  distracting envireonment.    Time 4    Period Weeks    Status Achieved   100% today     OT SHORT TERM GOAL #5   Title Pt will complete table top scanning tasks with 80% accuracy or better    Time 4    Period Weeks    Status Achieved   not consistent            OT Long Term Goals - 07/02/20 1256      OT LONG TERM GOAL #1   Title Pt will complete a simple cooking task including locating items in a resonable amount of time modified independently.    Status Achieved   Pt and dtr report she is performing at home     OT LONG TERM GOAL #2   Title Pt. will demonstrate improved bilateral UE coordination as evidenced by decreasing 9 hole peg test score by 5 secs.    Status Achieved   RUE 32.65, LUE 27.72     OT LONG TERM GOAL #3   Title Pt will complete tabletop scanning activities with 90% or better accuracy.    Time 4    Status Achieved   90% for number cancellation today.     OT LONG TERM GOAL #4   Title Pt will locate items in a mod distracting environment with 90% or better accuracy.    Status On-going   62% on first pass     OT LONG TERM GOAL #5   Title Pt/ family will I with HEP for functional activities    Time 4    Period Weeks    Status On-going      OT LONG TERM GOAL #6   Title Pt will utilize visual compensation strategies following review with min v.c    Time 4    Period Weeks    Status On-going                 Plan - 07/02/20 1256    Clinical Impression Statement Patient continues with significant visual deficits, and intermittent ability to compensate.    OT Occupational Profile and History Detailed Assessment- Review of Records and additional review of physical, cognitive, psychosocial history related to current functional performance    Occupational performance deficits (Please refer to  evaluation for details): ADL's;IADL's;Leisure;Social Participation    Body Structure / Function / Physical Skills ADL;Balance;Mobility;Strength;UE functional  use;FMC;Gait;Coordination;Decreased knowledge of precautions;GMC;ROM;IADL;Dexterity;Decreased knowledge of use of DME    Cognitive Skills Attention;Memory;Problem Solve;Safety Awareness;Sequencing;Thought;Understand    Rehab Potential Good    Clinical Decision Making Several treatment options, min-mod task modification necessary    Comorbidities Affecting Occupational Performance: May have comorbidities impacting occupational performance    Modification or Assistance to Complete Evaluation  Min-Moderate modification of tasks or assist with assess necessary to complete eval    OT Frequency 2x / week   anticipate d/c after 2 weeks   OT Duration 4 weeks    OT Treatment/Interventions Self-care/ADL training;Therapeutic exercise;Balance training;Aquatic Therapy;Ultrasound;Neuromuscular education;Manual Therapy;Splinting;Therapeutic activities;Cryotherapy;Paraffin;DME and/or AE instruction;Cognitive remediation/compensation;Visual/perceptual remediation/compensation;Fluidtherapy;Gait Training;Moist Heat;Contrast Bath;Passive range of motion;Patient/family education    Plan update functional activities HEP, reinforce visual compensation strategies, pt is scheduled for 3 additional visits    Consulted and Agree with Plan of Care Patient           Patient will benefit from skilled therapeutic intervention in order to improve the following deficits and impairments:   Body Structure / Function / Physical Skills: ADL,Balance,Mobility,Strength,UE functional use,FMC,Gait,Coordination,Decreased knowledge of precautions,GMC,ROM,IADL,Dexterity,Decreased knowledge of use of DME Cognitive Skills: Attention,Memory,Problem Solve,Safety Awareness,Sequencing,Thought,Understand     Visit Diagnosis: Visuospatial deficit  Attention and concentration deficit  Visual disturbance as complication of stroke  Other lack of coordination  Muscle weakness (generalized)    Problem List Patient Active Problem List    Diagnosis Date Noted  . Vitamin D deficiency 04/24/2020  . Eye pain 03/13/2020  . Hyperkalemia 01/14/2020  . Chronic RUQ pain 01/14/2020  . Sensation of fullness in ear, bilateral 01/11/2020  . Aphasia due to acute stroke ([place]) 08/23/2019  . Hyperlipemia 08/23/2019  . ICH (intracerebral hemorrhage) (HCC) L parietal w/ L SDH s/p crani 08/09/2019  . Subdural hematoma, acute ([place]) 08/09/2019  . Healthcare maintenance 06/20/2019  . History of strabismus surgery 03/31/2019  . Moderate, recurrent MDD 06/06/2018  . 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 Osteoarthritis of right knee 06/05/2018  . Essential hypertension 06/05/2018  . Anxiety 06/05/2018  . Age-related nuclear cataract of both eyes 11/26/2014  . Diplopia 11/26/2014  . Monocular esotropia of left eye 11/26/2014  . Corneal transplant failure 10/27/2011    Mariah Milling, OTR/L 07/02/2020, 12:57 PM  [facility] [address] [place], [place], [address] Phone: [phone]   Fax:  [phone]  Name: Michelle Carrillo MRN: [id number] Date of Birth: 01-29-1951

## 2020-07-09 ENCOUNTER — Ambulatory Visit: Payer: Medicare Other | Admitting: Occupational Therapy

## 2020-07-13 ENCOUNTER — Emergency Department (HOSPITAL_COMMUNITY)
Admission: EM | Admit: 2020-07-13 | Discharge: 2020-07-13 | Disposition: A | Discharge status: Home / Self Care | Payer: Medicare Other | Attending: Emergency Medicine | Admitting: Emergency Medicine

## 2020-07-13 ENCOUNTER — Ambulatory Visit (HOSPITAL_COMMUNITY)
Admission: EM | Admit: 2020-07-13 | Discharge: 2020-07-13 | Disposition: A | Discharge status: Nursing Home (Includes ICF) | Payer: Medicare Other | Attending: Internal Medicine | Admitting: Internal Medicine

## 2020-07-13 ENCOUNTER — Emergency Department (HOSPITAL_COMMUNITY): Payer: Medicare Other

## 2020-07-13 DIAGNOSIS — I616 Nontraumatic intracerebral hemorrhage, multiple localized: Secondary | ICD-10-CM | POA: Diagnosis not present

## 2020-07-13 DIAGNOSIS — R4781 Slurred speech: Secondary | ICD-10-CM | POA: Diagnosis not present

## 2020-07-13 DIAGNOSIS — R0902 Hypoxemia: Secondary | ICD-10-CM | POA: Diagnosis not present

## 2020-07-13 DIAGNOSIS — R4701 Aphasia: Secondary | ICD-10-CM

## 2020-07-13 DIAGNOSIS — R29810 Facial weakness: Secondary | ICD-10-CM | POA: Diagnosis not present

## 2020-07-13 DIAGNOSIS — G9389 Other specified disorders of brain: Secondary | ICD-10-CM | POA: Diagnosis not present

## 2020-07-13 DIAGNOSIS — R531 Weakness: Secondary | ICD-10-CM | POA: Diagnosis not present

## 2020-07-13 DIAGNOSIS — I10 Essential (primary) hypertension: Secondary | ICD-10-CM | POA: Diagnosis not present

## 2020-07-13 DIAGNOSIS — Z79899 Other long term (current) drug therapy: Secondary | ICD-10-CM | POA: Diagnosis not present

## 2020-07-13 DIAGNOSIS — R479 Unspecified speech disturbances: Secondary | ICD-10-CM | POA: Diagnosis not present

## 2020-07-13 DIAGNOSIS — G459 Transient cerebral ischemic attack, unspecified: Secondary | ICD-10-CM | POA: Diagnosis not present

## 2020-07-13 DIAGNOSIS — I639 Cerebral infarction, unspecified: Secondary | ICD-10-CM | POA: Diagnosis not present

## 2020-07-13 LAB — URINALYSIS, ROUTINE W REFLEX MICROSCOPIC
Bacteria, UA: NONE SEEN
Bilirubin Urine: NEGATIVE
Glucose, UA: NEGATIVE mg/dL
Ketones, ur: NEGATIVE mg/dL
Leukocytes,Ua: NEGATIVE
Nitrite: NEGATIVE
Protein, ur: NEGATIVE mg/dL
Specific Gravity, Urine: 1.002 — ABNORMAL LOW (ref 1.005–1.030)
pH: 8 (ref 5.0–8.0)

## 2020-07-13 LAB — COMPREHENSIVE METABOLIC PANEL
ALT: 17 U/L (ref 0–44)
AST: 28 U/L (ref 15–41)
Albumin: 3.3 g/dL — ABNORMAL LOW (ref 3.5–5.0)
Alkaline Phosphatase: 113 U/L (ref 38–126)
Anion gap: 7 (ref 5–15)
BUN: 14 mg/dL (ref 8–23)
CO2: 26 mmol/L (ref 22–32)
Calcium: 8.6 mg/dL — ABNORMAL LOW (ref 8.9–10.3)
Chloride: 100 mmol/L (ref 98–111)
Creatinine, Ser: 0.64 mg/dL (ref 0.44–1.00)
GFR, Estimated: >60 mL/min (ref >60)
Glucose, Bld: 125 mg/dL — ABNORMAL HIGH (ref 70–99)
Potassium: 4.5 mmol/L (ref 3.5–5.1)
Sodium: 133 mmol/L — ABNORMAL LOW (ref 135–145)
Total Bilirubin: 0.9 mg/dL (ref 0.3–1.2)
Total Protein: 7 g/dL (ref 6.5–8.1)

## 2020-07-13 LAB — CBC
HCT: 39.1 % (ref 36.0–46.0)
Hemoglobin: 13 g/dL (ref 12.0–15.0)
MCH: 31.3 pg (ref 26.0–34.0)
MCHC: 33.2 g/dL (ref 30.0–36.0)
MCV: 94 fL (ref 80.0–100.0)
Platelets: 301 10*3/uL (ref 150–400)
RBC: 4.16 MIL/uL (ref 3.87–5.11)
RDW: 12.8 % (ref 11.5–15.5)
WBC: 12.1 10*3/uL — ABNORMAL HIGH (ref 4.0–10.5)
nRBC: 0 % (ref 0.0–0.2)

## 2020-07-13 LAB — CBG MONITORING, ED: Glucose-Capillary: 115 mg/dL — ABNORMAL HIGH (ref 70–99)

## 2020-07-13 NOTE — ED Provider Notes (Signed)
[facility] EMERGENCY DEPARTMENT Provider Note   CSN: [id number] Arrival date & time: 07/13/20  1310     History Chief Complaint  Patient presents with  . Aphasia    Lasted <24min.     Michelle Carrillo is a 70 y.o. female.  Michelle Carrillo has a history of spontaneous intracranial hemorrhage with subdural hematoma treated with craniotomy in May 2021.  Today after taking a shower, she had a brief period of time where she had expressive aphasia.  She was trying to call out to her daughter, and she could not get the words out of her mouth.  The whole episode lasted about 3 minutes and was not associated with any other neurologic symptoms.  She is still in rehab from this neurologic event secondary to some residual visual deficits.  The history is provided by the patient. Language interpreter used: Patient offered an interpreter but preferred her daughter.  Neurologic Problem This is a new problem. Episode onset: today prior to 12 pm. The problem occurs constantly (lasted 3 minutes or less). The problem has been resolved. Pertinent negatives include no chest pain, no abdominal pain, no headaches and no shortness of breath. Nothing aggravates the symptoms. Nothing relieves the symptoms. She has tried nothing for the symptoms. The treatment provided no relief.       Past Medical History:  Diagnosis Date  . Anxiety    on meds  . Arthritis    bilateral knees  . Cataract    RIGHT eye  . Depression    on meds  . Herpes zoster kerstitis s/p corneal transplant 2015   . Hypertension    on meds  . Right knee osteoarthritis   . Strabismus    due to eye injury as a child  . Stroke [facility]) 07/2019   stroke with craniotomy    Patient Active Problem List   Diagnosis Date Noted  . Vitamin D deficiency 04/24/2020  . Eye pain 03/13/2020  . Hyperkalemia 01/14/2020  . Chronic RUQ pain 01/14/2020  . Sensation of fullness in ear, bilateral 01/11/2020  . Aphasia  due to acute stroke ([place]) 08/23/2019  . Hyperlipemia 08/23/2019  . ICH (intracerebral hemorrhage) (HCC) L parietal w/ L SDH s/p crani 08/09/2019  . Subdural hematoma, acute ([place]) 08/09/2019  . Healthcare maintenance 06/20/2019  . History of strabismus surgery 03/31/2019  . Moderate, recurrent MDD 06/06/2018  . 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 Osteoarthritis of right knee 06/05/2018  . Essential hypertension 06/05/2018  . Anxiety 06/05/2018  . Age-related nuclear cataract of both eyes 11/26/2014  . Diplopia 11/26/2014  . Monocular esotropia of left eye 11/26/2014  . Corneal transplant failure 10/27/2011    Past Surgical History:  Procedure Laterality Date  . CESAREAN SECTION      x 2   . CHOLECYSTECTOMY    . COLONOSCOPY  04/22/2020  . CORNEAL TRANSPLANT Left    2006 and 2016  . CRANIOTOMY Left 08/09/2019   Procedure: CRANIOTOMY HEMATOMA EVACUATION SUBDURAL;  Surgeon: Ashok Pall, MD;  Location: [facility];  Service: Neurosurgery;  Laterality: Left;  left     OB History   No obstetric history on file.     Family History  Problem Relation Age of Onset  . Diabetes Mother   . Ovarian cancer Mother   . High blood pressure Sister   . Diabetes Sister   . Diabetes Brother   . Stomach cancer Maternal Grandmother 54  .  Colon polyps Neg Hx   . Colon cancer Neg Hx   . Rectal cancer Neg Hx   . Esophageal cancer Neg Hx     Social History   Tobacco Use  . Smoking status: Never Smoker  . Smokeless tobacco: Never Used  Vaping Use  . Vaping Use: Never used  Substance Use Topics  . Alcohol use: Never  . Drug use: Never    Home Medications Prior to Admission medications   Medication Sig Start Date End Date Taking? Authorizing Provider  amLODipine (NORVASC) 10 MG tablet Take 1 tablet (10 mg total) by mouth daily. 10/29/19 11/28/19  Mosetta Anis, MD  atorvastatin (LIPITOR) 10 MG tablet Take by mouth.    [provider]   feeding supplement, ENSURE ENLIVE, (ENSURE ENLIVE) LIQD Take 237 mLs by mouth 3 (three) times daily between meals. 08/23/19   Donzetta Starch, NP  hydrOXYzine (ATARAX/VISTARIL) 10 MG tablet Take 1 tablet (10 mg total) by mouth at bedtime as needed. 01/09/20   Sanjuan Dame, MD  lisinopril (ZESTRIL) 20 MG tablet Take 1 tablet (20 mg total) by mouth daily. 10/29/19   Mosetta Anis, MD  Multiple Vitamin (MULTIVITAMIN WITH MINERALS) TABS tablet Take 1 tablet by mouth daily. 08/24/19   Donzetta Starch, NP  neomycin-polymyxin b-dexamethasone (MAXITROL) 3.5-10000-0.1 OINT at bedtime. 03/14/20   [provider]  PARoxetine (PAXIL) 20 MG tablet Take 1 tablet (20 mg total) by mouth daily. 10/29/19 10/28/20  Mosetta Anis, MD  Polyethyl Glycol-Propyl Glycol (SYSTANE ULTRA) 0.4-0.3 % SOLN Apply 1-2 drops to eye daily as needed. 01/09/20   Sanjuan Dame, MD  prednisoLONE acetate (PRED FORTE) 1 % ophthalmic suspension Place 1 drop into both eyes in the morning and at bedtime. 01/09/20   Sanjuan Dame, MD    Allergies    Patient has no known allergies.  Review of Systems   Review of Systems  Constitutional: Negative for chills and fever.  HENT: Negative for ear pain and sore throat.   Eyes: Negative for pain and visual disturbance.  Respiratory: Negative for cough and shortness of breath.   Cardiovascular: Negative for chest pain and palpitations.  Gastrointestinal: Negative for abdominal pain and vomiting.  Genitourinary: Negative for dysuria and hematuria.  Musculoskeletal: Negative for arthralgias and back pain.  Skin: Negative for color change and rash.  Neurological: Negative for seizures, syncope and headaches.  All other systems reviewed and are negative.   Physical Exam Updated Vital Signs BP 136/67   Pulse 94   Temp 98.6 F (37 C) (Oral)   Resp 18   Ht 4\' 11"  (1.499 m)   Wt 73.8 kg   SpO2 99%   BMI 32.86 kg/m   Physical Exam Vitals and nursing note reviewed.   Constitutional:      General: She is not in acute distress.    Appearance: She is well-developed.  HENT:     Head: Normocephalic and atraumatic.  Eyes:     Conjunctiva/sclera: Conjunctivae normal.  Cardiovascular:     Rate and Rhythm: Normal rate and regular rhythm.     Heart sounds: No murmur heard.   Pulmonary:     Effort: Pulmonary effort is normal. No respiratory distress.     Breath sounds: Normal breath sounds.  Abdominal:     Palpations: Abdomen is soft.     Tenderness: There is no abdominal tenderness.  Musculoskeletal:     Cervical back: Neck supple.  Skin:    General: Skin is warm  and dry.  Neurological:     General: No focal deficit present.     Mental Status: She is alert.     Cranial Nerves: No cranial nerve deficit.     Sensory: No sensory deficit.     Motor: No weakness.     Coordination: Coordination normal.  Psychiatric:        Mood and Affect: Mood normal.     ED Results / Procedures / Treatments   Labs (all labs ordered are listed, but only abnormal results are displayed) Labs Reviewed  CBC - Abnormal; Notable for the following components:      Result Value   WBC 12.1 (*)    All other components within normal limits  COMPREHENSIVE METABOLIC PANEL - Abnormal; Notable for the following components:   Sodium 133 (*)    Glucose, Bld 125 (*)    Calcium 8.6 (*)    Albumin 3.3 (*)    All other components within normal limits  URINALYSIS, ROUTINE W REFLEX MICROSCOPIC - Abnormal; Notable for the following components:   Color, Urine COLORLESS (*)    Specific Gravity, Urine 1.002 (*)    Hgb urine dipstick SMALL (*)    All other components within normal limits    EKG EKG Interpretation  Date/Time:  Sunday July 13 2020 13:16:31 EDT Ventricular Rate:  90 PR Interval:  145 QRS Duration: 88 QT Interval:  335 QTC Calculation: 410 R Axis:   59 Text Interpretation: Sinus rhythm Nonspecific repol abnormality, diffuse leads Baseline wander in lead(s)  II III aVF T wave inversions most pronounced inferolaterally and new when compared to EKG from 01/09/20 axis normal Confirmed by Lorre Munroe ([id number]) on 07/13/2020 2:25:15 PM   Radiology CT HEAD WO CONTRAST  Result Date: 07/13/2020 CLINICAL DATA:  70 year old female with transient ischemic attack. EXAM: CT HEAD WITHOUT CONTRAST TECHNIQUE: Contiguous axial images were obtained from the base of the skull through the vertex without intravenous contrast. COMPARISON:  Head CT dated 10/16/2019. FINDINGS: Brain: Focal area of old infarct and encephalomalacia in the left parietal lobe. There is associated mild ex vacuo dilatation of the left lateral ventricle. The ventricles and sulci are otherwise appropriate size for patient's age. There is no acute intracranial hemorrhage. No mass effect midline shift. No extra-axial fluid collection. Vascular: No hyperdense vessel or unexpected calcification. Skull: Left frontoparietal and temporal craniotomy. No acute calvarial pathology. Sinuses/Orbits: A 2 cm left maxillary sinus retention cyst or polyp. The visualized paranasal sinuses and mastoid air cells are otherwise clear. Other: None IMPRESSION: 1. No acute intracranial pathology. 2. Old left parietal infarct and encephalomalacia. 3. Left frontoparietal and temporal craniotomy. Electronically Signed   By: Anner Crete M.D.   On: 07/13/2020 15:29   MR Brain Wo Contrast (neuro protocol)  Result Date: 07/13/2020 CLINICAL DATA:  TIA. Difficulty with speech today. Symptoms lasted less than 1 hour. Speech has returned to normal. Personal history of infarct. EXAM: MRI HEAD WITHOUT CONTRAST TECHNIQUE: Multiplanar, multiecho pulse sequences of the brain and surrounding structures were obtained without intravenous contrast. COMPARISON:  CT head without contrast 07/13/2020 FINDINGS: Brain: Question chronic hemorrhagic encephalomalacia of the left parietal lobe is stable. Focal ex vacuo dilation of the left lateral ventricle noted  with volume loss. No acute infarct, hemorrhage, or mass lesion is present. Ventricles are otherwise within normal limits. No significant extraaxial fluid collection is present. Basal ganglia are intact. The internal auditory canals are within normal limits. The brainstem and cerebellum are within normal limits. Vascular:  Flow is present in the major intracranial arteries. Skull and upper cervical spine: Craniocervical junction is normal. Flow is present in the vertebral arteries bilaterally. Visualized intracranial contents are normal. Sinuses/Orbits: The paranasal sinuses and mastoid air cells are clear. Left lens replacement noted. Globes and orbits are otherwise within limits. IMPRESSION: 1. No acute intracranial abnormality or significant interval change. 2. Stable chronic hemorrhagic encephalomalacia of the left parietal lobe. Electronically Signed   By: San Morelle M.D.   On: 07/13/2020 18:29    Procedures Procedures   Medications Ordered in ED Medications - No data to display  ED Course  I have reviewed the triage vital signs and the nursing notes.  Pertinent labs & imaging results that were available during my care of the patient were reviewed by me and considered in my medical decision making (see chart for details).  Clinical Course as of 07/13/20 1904  Nancy Fetter Jul 13, 2020  1627 I spoke with neurology.  The patient's symptoms seem more seizure in etiology and could correspond to the site of her intracranial hemorrhage.  They recommend MRI and then an outpatient EEG. [AW]    Clinical Course User Index [AW] Arnaldo Natal, MD   MDM Rules/Calculators/A&P                          Michelle Carrillo presented with a 3-minute episode of aphasia.  She was and remained neurologically normal during her ED course.  ED evaluation was conducted to evaluate for acute neurologic emergency or other source of her symptoms.  After consultation with neurology, it seems that she possibly had  a seizure activity.  She did have a bleed in the left parietal region, and it is possible that any events occurring due to abnormal foci in the brain relating to language would likely come from the left side.  MRI was normal, and the patient will be discharged home.  I have placed an order for an ambulatory EEG, and neurology will follow this study. Final Clinical Impression(s) / ED Diagnoses Final diagnoses:  Aphasia    Rx / DC Orders ED Discharge Orders    None       Arnaldo Natal, MD 07/13/20 [phone]

## 2020-07-13 NOTE — ED Notes (Addendum)
Daughter states pt now seems "more back to normal".  [facility] present; report provided.

## 2020-07-13 NOTE — Discharge Instructions (Signed)
Go directly to the Emergency department

## 2020-07-13 NOTE — Discharge Instructions (Signed)
Someone will contact you about an EEG appointment to make sure you are not having a seizure.

## 2020-07-13 NOTE — ED Provider Notes (Signed)
MSE was initiated and I personally evaluated the patient and placed orders (if any) at  1:10 PM on July 13, 2020.  The patient appears stable so that the remainder of the MSE may be completed by another provider.  Patient with history of stroke.  Having difficulty with speech this morning.  Lasted maybe less than an hour.  Speaks Spanish only but her speech appears to be normal.  Normal strength and sensation.  Overall appears to be at her baseline.  I had a call from urgent care because they want activated code stroke however seems to be more of a TIA.  Will initiate TIA work-up.  Stable for waiting room.   Lennice Sites, DO 07/13/20 1311

## 2020-07-13 NOTE — ED Triage Notes (Signed)
BIB EMS from Urgent Care for slurred speech episode. LKW was 1130. Noted to have slurred speech by daughter at 1200. In route to UC improved and at 1230 at [facility] no slur was present. PMHx stroke in 2021 with no residual left side weakness after rehab and PT per EMS report.

## 2020-07-13 NOTE — ED Notes (Signed)
T/C to Roselyn Reef, ED Charge RN for report.

## 2020-07-13 NOTE — ED Provider Notes (Signed)
[place]    CSN: [id number] Arrival date & time: 07/13/20  1230      History   Chief Complaint No chief complaint on file.   HPI Michelle Carrillo is a 70 y.o. female.   Patient is here for evaluation of slurred speech and confusion that started approximately at noon today.  Patient with history of stroke, subdural hematoma, and intracranial hemorrhage.  Daughter reports patient has some issues with time since stroke about 1 year ago but states that speech has been normal.  CBG and vital signs WNL during triage and symptoms appear to have resolved at this time.  Patient denies any headache, numbness, dizziness, blurred vision.    The history is provided by the patient and a relative. The history is limited by a language barrier.    Past Medical History:  Diagnosis Date  . Anxiety    on meds  . Arthritis    bilateral knees  . Cataract    RIGHT eye  . Depression    on meds  . Herpes zoster kerstitis s/p corneal transplant 2015   . Hypertension    on meds  . Right knee osteoarthritis   . Strabismus    due to eye injury as a child  . Stroke [facility] 07/2019   stroke with craniotomy    Patient Active Problem List   Diagnosis Date Noted  . Vitamin D deficiency 04/24/2020  . Eye pain 03/13/2020  . Hyperkalemia 01/14/2020  . Chronic RUQ pain 01/14/2020  . Sensation of fullness in ear, bilateral 01/11/2020  . Aphasia due to acute stroke ([place]) 08/23/2019  . Hyperlipemia 08/23/2019  . ICH (intracerebral hemorrhage) (HCC) L parietal w/ L SDH s/p crani 08/09/2019  . Subdural hematoma, acute ([place]) 08/09/2019  . Healthcare maintenance 06/20/2019  . History of strabismus surgery 03/31/2019  . Moderate, recurrent MDD 06/06/2018  . 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 Osteoarthritis of right knee 06/05/2018  . Essential hypertension 06/05/2018  . Anxiety 06/05/2018  . Age-related nuclear cataract of both eyes 11/26/2014   . Diplopia 11/26/2014  . Monocular esotropia of left eye 11/26/2014  . Corneal transplant failure 10/27/2011    Past Surgical History:  Procedure Laterality Date  . CESAREAN SECTION      x 2   . CHOLECYSTECTOMY    . COLONOSCOPY  04/22/2020  . CORNEAL TRANSPLANT Left    2006 and 2016  . CRANIOTOMY Left 08/09/2019   Procedure: CRANIOTOMY HEMATOMA EVACUATION SUBDURAL;  Surgeon: Ashok Pall, MD;  Location: [place];  Service: Neurosurgery;  Laterality: Left;  left    OB History   No obstetric history on file.      Home Medications    Prior to Admission medications   Medication Sig Start Date End Date Taking? Authorizing Provider  amLODipine (NORVASC) 10 MG tablet Take 1 tablet (10 mg total) by mouth daily. 10/29/19 11/28/19  Mosetta Anis, MD  atorvastatin (LIPITOR) 10 MG tablet Take by mouth.    [provider]  feeding supplement, ENSURE ENLIVE, (ENSURE ENLIVE) LIQD Take 237 mLs by mouth 3 (three) times daily between meals. 08/23/19   Donzetta Starch, NP  hydrOXYzine (ATARAX/VISTARIL) 10 MG tablet Take 1 tablet (10 mg total) by mouth at bedtime as needed. 01/09/20   Sanjuan Dame, MD  lisinopril (ZESTRIL) 20 MG tablet Take 1 tablet (20 mg total) by mouth daily. 10/29/19   Mosetta Anis, MD  Multiple Vitamin (MULTIVITAMIN  WITH MINERALS) TABS tablet Take 1 tablet by mouth daily. 08/24/19   Donzetta Starch, NP  neomycin-polymyxin b-dexamethasone (MAXITROL) 3.5-10000-0.1 OINT at bedtime. 03/14/20   [provider]  PARoxetine (PAXIL) 20 MG tablet Take 1 tablet (20 mg total) by mouth daily. 10/29/19 10/28/20  Mosetta Anis, MD  Polyethyl Glycol-Propyl Glycol (SYSTANE ULTRA) 0.4-0.3 % SOLN Apply 1-2 drops to eye daily as needed. 01/09/20   Sanjuan Dame, MD  prednisoLONE acetate (PRED FORTE) 1 % ophthalmic suspension Place 1 drop into both eyes in the morning and at bedtime. 01/09/20   Sanjuan Dame, MD    Family History Family History  Problem Relation Age of Onset   . Diabetes Mother   . Ovarian cancer Mother   . High blood pressure Sister   . Diabetes Sister   . Diabetes Brother   . Stomach cancer Maternal Grandmother 50  . Colon polyps Neg Hx   . Colon cancer Neg Hx   . Rectal cancer Neg Hx   . Esophageal cancer Neg Hx     Social History Social History   Tobacco Use  . Smoking status: Never Smoker  . Smokeless tobacco: Never Used  Vaping Use  . Vaping Use: Never used  Substance Use Topics  . Alcohol use: Never  . Drug use: Never     Allergies   Patient has no known allergies.   Review of Systems Review of Systems  Neurological: Positive for speech difficulty. Negative for dizziness, syncope and facial asymmetry.  Psychiatric/Behavioral: Positive for confusion.     Physical Exam Triage Vital Signs ED Triage Vitals  Enc Vitals Group     BP 07/13/20 1233 (!) 163/82     Pulse Rate 07/13/20 1233 99     Resp --      Temp --      Temp src --      SpO2 07/13/20 1233 96 %     Weight --      Height --      Head Circumference --      Peak Flow --      Pain Score 07/13/20 1256 0     Pain Loc --      Pain Edu? --      Excl. in [place]? --    No data found.  Updated Vital Signs BP (!) 163/82   Pulse 99   SpO2 96%   Visual Acuity Right Eye Distance:   Left Eye Distance:   Bilateral Distance:    Right Eye Near:   Left Eye Near:    Bilateral Near:     Physical Exam Vitals and nursing note reviewed.  Constitutional:      General: She is not in acute distress.    Appearance: Normal appearance. She is not ill-appearing, toxic-appearing or diaphoretic.  HENT:     Head: Normocephalic and atraumatic.  Eyes:     Extraocular Movements: Extraocular movements intact.     Conjunctiva/sclera: Conjunctivae normal.     Pupils:     Right eye: Pupil is round, reactive and not sluggish.     Comments: Left eye with with corneal transplant  Cardiovascular:     Rate and Rhythm: Normal rate and regular rhythm.     Pulses: Normal  pulses.     Heart sounds: Normal heart sounds.  Pulmonary:     Effort: Pulmonary effort is normal.     Breath sounds: Normal breath sounds.  Abdominal:     General: Abdomen is  flat.     Palpations: Abdomen is soft.  Musculoskeletal:        General: Normal range of motion.     Cervical back: Normal range of motion.  Skin:    General: Skin is warm and dry.  Neurological:     Mental Status: She is alert. She is disoriented.     Cranial Nerves: Cranial nerves are intact.     Sensory: Sensation is intact.     Motor: No weakness.     Coordination: Romberg sign negative. Finger-Nose-Finger Test normal.     Comments: Patient with some difficulty following directions during exam, possible due to language barrier or deficits from prior stroke.  Patient alert to self but disoriented to time, reports having some issues with time since prior stroke  Psychiatric:        Mood and Affect: Mood normal.      UC Treatments / Results  Labs (all labs ordered are listed, but only abnormal results are displayed) Labs Reviewed  CBG MONITORING, ED - Abnormal; Notable for the following components:      Result Value   Glucose-Capillary 115 (*)    All other components within normal limits    EKG   Radiology No results found.  Procedures Procedures (including critical care time)  Medications Ordered in UC Medications - No data to display  Initial Impression / Assessment and Plan / UC Course  I have reviewed the triage vital signs and the nursing notes.  Pertinent labs & imaging results that were available during my care of the patient were reviewed by me and considered in my medical decision making (see chart for details).     Aphasia Based on patient's history of previous CVA and sudden onset of aphasia with confusion, patient will be sent to the emergency room for further evaluation of possible CVA or intracranial abnormality. CBG 115 in office and vital signs stable. Ambulance called  for emergency transport of patient.  ED physician notified of patient's imminent arrival.   Final Clinical Impressions(s) / UC Diagnoses   Final diagnoses:  Aphasia     Discharge Instructions     Go directly to the Emergency department.    ED Prescriptions    None     PDMP not reviewed this encounter.   Pearson Forster, NP 07/13/20 1334

## 2020-07-13 NOTE — ED Triage Notes (Signed)
Per family, approx 40 min ago pt came to daughter unable to speak.  States pt woke up normal this AM.  Pt alert, not oriented to month.  Daughter states pt had CVA last yr that she is in therapy for; occasionally has memory problems but feels this is different. Face symmetrical, bilat hand grasps = and strong.

## 2020-07-13 NOTE — ED Notes (Addendum)
Code Stroke initiated per M. White, CMA - [facility] notified; states transport unavailabe.  [facility] notified.  Inocencio Homes, NP notified ED staff of Code Stroke.

## 2020-07-15 ENCOUNTER — Ambulatory Visit (INDEPENDENT_AMBULATORY_CARE_PROVIDER_SITE_OTHER): Payer: Medicare Other

## 2020-07-15 ENCOUNTER — Encounter: Payer: Self-pay | Admitting: Orthopaedic Surgery

## 2020-07-15 ENCOUNTER — Ambulatory Visit (INDEPENDENT_AMBULATORY_CARE_PROVIDER_SITE_OTHER): Payer: Medicare Other | Admitting: Orthopaedic Surgery

## 2020-07-15 VITALS — Ht <= 58 in | Wt 163.0 lb

## 2020-07-15 DIAGNOSIS — M1711 Unilateral primary osteoarthritis, right knee: Secondary | ICD-10-CM

## 2020-07-15 NOTE — Progress Notes (Signed)
Office Visit Note   Patient: Michelle Carrillo           Date of Birth: 1951-01-31           MRN: [id number] Visit Date: 07/15/2020              Requested by: Jeralyn Bennett, MD [address] [place],  [place] [address] PCP: Jeralyn Bennett, MD   Assessment & Plan: Visit Diagnoses:  1. Primary osteoarthritis of right knee     Plan: Impression is bone-on-bone degenerative changes of the medial compartment with tricompartmental degenerative joint disease.  We had a lengthy discussion today on her treatment options which include both conservative versus total knee replacement.  I sense that she is quite nervous about the risks of surgery and I did my best to answer her questions.  I did recommend that she see her primary care doctor to get her thoughts about knee replacement what potential risks she might face from a medical standpoint.  All questions answered to her satisfaction today.  She has my card if she needs me.  Follow-up as needed.  Total face to face encounter time was greater than 25 minutes and over half of this time was spent in counseling and/or coordination of care.  Follow-Up Instructions: Return if symptoms worsen or fail to improve.   Orders:  Orders Placed This Encounter  Procedures  . XR KNEE 3 VIEW RIGHT   No orders of the defined types were placed in this encounter.     Procedures: No procedures performed   Clinical Data: No additional findings.   Subjective: Chief Complaint  Patient presents with  . Right Knee - Pain    Michelle Carrillo is a 70 year old female here with her daughter and language interpreter for chronic right knee pain for several years with worsening.  She originally was going to have an right knee replacement but unfortunately had a stroke which postpone things.  She has had cortisone and gel injections in the past with temporary relief.  She rates the pain as 7 out of 10 that is worse with activity and walking.  She denies any swelling.   She has a grinding pain in the right knee as well.   Review of Systems  Constitutional: Negative.   HENT: Negative.   Eyes: Negative.   Respiratory: Negative.   Cardiovascular: Negative.   Endocrine: Negative.   Musculoskeletal: Negative.   Neurological: Negative.   Hematological: Negative.   Psychiatric/Behavioral: Negative.   All other systems reviewed and are negative.    Objective: Vital Signs: Ht 4' 9.5" (1.461 m)   Wt 163 lb (73.9 kg)   BMI 34.66 kg/m   Physical Exam Vitals and nursing note reviewed.  Constitutional:      Appearance: She is well-developed.  HENT:     Head: Normocephalic and atraumatic.  Pulmonary:     Effort: Pulmonary effort is normal.  Abdominal:     Palpations: Abdomen is soft.  Musculoskeletal:     Cervical back: Neck supple.  Skin:    General: Skin is warm.     Capillary Refill: Capillary refill takes less than 2 seconds.  Neurological:     Mental Status: She is alert and oriented to person, place, and time.  Psychiatric:        Behavior: Behavior normal.        Thought Content: Thought content normal.        Judgment: Judgment normal.     Ortho Exam Right  knee-shows no joint effusion.  Patellofemoral crepitus with range of motion.  Mild pain with attempted range of motion.  Because her cruciates are stable.  Range of motion is slightly decreased. Specialty Comments:  No specialty comments available.  Imaging: XR KNEE 3 VIEW RIGHT  Result Date: 07/15/2020 Medial compartment joint space narrowing.  Spurring of the patellofemoral compartment.    PMFS History: Patient Active Problem List   Diagnosis Date Noted  . Vitamin D deficiency 04/24/2020  . Eye pain 03/13/2020  . Hyperkalemia 01/14/2020  . Chronic RUQ pain 01/14/2020  . Sensation of fullness in ear, bilateral 01/11/2020  . Aphasia due to acute stroke ([place]) 08/23/2019  . Hyperlipemia 08/23/2019  . ICH (intracerebral hemorrhage) (HCC) L parietal w/ L SDH s/p crani  08/09/2019  . Subdural hematoma, acute ([place]) 08/09/2019  . Healthcare maintenance 06/20/2019  . History of strabismus surgery 03/31/2019  . Moderate, recurrent MDD 06/06/2018  . 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 Osteoarthritis of right knee 06/05/2018  . Essential hypertension 06/05/2018  . Anxiety 06/05/2018  . Age-related nuclear cataract of both eyes 11/26/2014  . Diplopia 11/26/2014  . Monocular esotropia of left eye 11/26/2014  . Corneal transplant failure 10/27/2011   Past Medical History:  Diagnosis Date  . Anxiety    on meds  . Arthritis    bilateral knees  . Cataract    RIGHT eye  . Depression    on meds  . Herpes zoster kerstitis s/p corneal transplant 2015   . Hypertension    on meds  . Right knee osteoarthritis   . Strabismus    due to eye injury as a child  . Stroke [facility] 07/2019   stroke with craniotomy    Family History  Problem Relation Age of Onset  . Diabetes Mother   . Ovarian cancer Mother   . High blood pressure Sister   . Diabetes Sister   . Diabetes Brother   . Stomach cancer Maternal Grandmother 17  . Colon polyps Neg Hx   . Colon cancer Neg Hx   . Rectal cancer Neg Hx   . Esophageal cancer Neg Hx     Past Surgical History:  Procedure Laterality Date  . CESAREAN SECTION      x 2   . CHOLECYSTECTOMY    . COLONOSCOPY  04/22/2020  . CORNEAL TRANSPLANT Left    2006 and 2016  . CRANIOTOMY Left 08/09/2019   Procedure: CRANIOTOMY HEMATOMA EVACUATION SUBDURAL;  Surgeon: Ashok Pall, MD;  Location: [place];  Service: Neurosurgery;  Laterality: Left;  left   Social History   Occupational History  . Not on file  Tobacco Use  . Smoking status: Never Smoker  . Smokeless tobacco: Never Used  Vaping Use  . Vaping Use: Never used  Substance and Sexual Activity  . Alcohol use: Never  . Drug use: Never  . Sexual activity: Not on file

## 2020-07-17 ENCOUNTER — Ambulatory Visit (INDEPENDENT_AMBULATORY_CARE_PROVIDER_SITE_OTHER): Payer: Medicare Other | Admitting: Adult Health

## 2020-07-17 ENCOUNTER — Encounter: Payer: Self-pay | Admitting: Adult Health

## 2020-07-17 VITALS — BP 121/68 | HR 80 | Ht 60.0 in | Wt 164.0 lb

## 2020-07-17 DIAGNOSIS — I611 Nontraumatic intracerebral hemorrhage in hemisphere, cortical: Secondary | ICD-10-CM

## 2020-07-17 DIAGNOSIS — R7309 Other abnormal glucose: Secondary | ICD-10-CM

## 2020-07-17 DIAGNOSIS — R4701 Aphasia: Secondary | ICD-10-CM | POA: Diagnosis not present

## 2020-07-17 DIAGNOSIS — E785 Hyperlipidemia, unspecified: Secondary | ICD-10-CM

## 2020-07-17 DIAGNOSIS — H53461 Homonymous bilateral field defects, right side: Secondary | ICD-10-CM | POA: Diagnosis not present

## 2020-07-17 MED ORDER — ASPIRIN EC 81 MG PO TBEC: 81.0000 mg | DELAYED_RELEASE_TABLET | Freq: Every day | ORAL | 11 refills | Status: DC

## 2020-07-17 MED ORDER — ATORVASTATIN CALCIUM 10 MG PO TABS: 10.0000 mg | ORAL_TABLET | Freq: Every day | ORAL | 0 refills | Status: DC

## 2020-07-17 NOTE — Telephone Encounter (Signed)
Noted! Thank you

## 2020-07-17 NOTE — Patient Instructions (Signed)
Referral placed neuro-ophthalmology - you will be called to schedule  Order placed to obtain EEG to rule out any seizure activity  You will also be called to obtain carotid ultrasound to assess for carotid narrowing  Recommend starting aspirin 81 mg daily and restarting  Atorvastatin 10mg  daily  for secondary stroke prevention  Restart therapies as this was previously beneficial   Continue to follow up with PCP regarding cholesterol and blood pressure management  Maintain strict control of hypertension with blood pressure goal below 130/90 and cholesterol with LDL cholesterol (bad cholesterol) goal below 70 mg/dL.       Followup in the future with me in 6 months or call earlier if needed       Thank you for coming to see [place] at [facility]. I hope we have been able to provide you high quality care today.  You may receive a patient satisfaction survey over the next few weeks. We would appreciate your feedback and comments so that we may continue to improve ourselves and the health of our patients.

## 2020-07-17 NOTE — Telephone Encounter (Signed)
I called and spoke daughter of pt.  I relayed will fill for 30 days, will need refills from pcp.  She verbalized understanding.  Order placed atovastatin 10mg  po daily #30, no refill. [facility].

## 2020-07-17 NOTE — Progress Notes (Signed)
[facility] [address] [place]. [place] [address] [phone]       OFFICE FOLLOW-UP NOTE  Michelle Carrillo Date of Birth:  1950/11/08 Medical Record Number:  [id number]    Chief Complaint  Patient presents with  . Follow-up    RM 14 with daughter/interpreter  [name]) Went to ER last week for speech difficulty      HPI:   Today, 07/17/2020, pt returns for scheduled 55-month stroke follow-up accompanied by her daughter, [name], who is a [facility] interpreter   She reports continued visual impairment, "being clumsy" and cognitive impairment with some improvement since prior visit Recently working with OT/SLP but daughter reports this has been completed Since stopping, patient does not do any of the exercises as daughter reports she is very unmotivated Episode on 07/13/2020 (evaluated in ED) consisted of word finding difficulty that lasted "just a few seconds" per patient but per ED notes, lasted approx 3 minutes.  No other associated symptoms such as weakness, numbness/tingling, confusion, altered mental status or loss of consciousness.  Neuro consult who felt possibly more related to seizure activity especially with history of ICH. MRI negative for acute infarct.  Recommended EEG outpatient.  No additional events since that time.  Previously on atorvastatin but per daughter, PCP recommended discontinuing and recommended diet modification  Blood pressure 121/68 -routinely monitored at home which has been stable  No further concerns at this time    History provided for reference purposes only Update 01/14/2020 JM: Ms. Gillyard returns for 30-month stroke follow-up accompanied by her daughter who assists with interpretation. Does report continued cognitive impairment such as difficulty concentrating and short-term memory loss and occasional word finding difficulty. Completed HH SLP - daughter questioning if additional outpatient therapy would be beneficial.  She continues to live with her husband and 2 daughters. Maintains ADLs independently but does need assistance with IADLs such as bill paying and cooking.  Daughter reports visual concerns post stroke such as bumping into different objects currently being followed by ophthalmology with extensive ophthalmologic history.  Denies diplopia or blurred vision but possibly right peripheral impairment.  Denies new or worsening stroke/TIA symptoms.  Repeat CT head showed resolution of prior ICH.  EEG showed no evidence of seizure activity.  Keppra discontinued after slow titration and denies seizure activity or symptoms.  Blood pressure today 132/68. Monitors at home which has been stable.  Continues to have issues with depression/anxiety currently being managed by PCP.  No further concerns at this time.  Initial visit 10/09/2019 Dr. Phillips Climes. Michelle Carrillo is a pleasant 70 year old Hispanic lady who is accompanied today by her daughter as well as Spanish language interpreter Davy Pique who translates for her.  History is obtained from them, review of electronic medical records and I personally reviewed imaging films in PACS.  She has past medical history of hypertension, anxiety and depression who presented to [facility] emergency room on 08/09/2019 with altered mental status.  CT scan of the head showed a large moderate size cortically based hematoma in the left parietal region with extension into the subdural space resulting in a fair sized subdural hematoma with left-to-right midline shift.  She had neurological worsening on exam requiring emergent neurosurgical consultation and patient underwent craniotomy for evacuation of the subdural hematoma.  She was kept in the intensive care unit and blood pressure was tightly controlled.  She was started on hypertonic saline.  Follow-up CT scan showed gradual improvement of her rightward midline shift from 7 mm down.  2D echo showed normal ejection fraction without cardiac  source of embolism.  EEG shows no seizure activity but she was started on Keppra for seizure prophylaxis.  Repeat CT on the day of discharge on 08/23/2019 showed reduction in midline shift from 11 to 8 mm.  Patient was found to have mild aphasia on exam but otherwise no focal deficits.  She was transferred to inpatient rehab to [facility] in [place] as there was no beds available at [facility] inpatient rehab.  Patient was there for 3 weeks and is presently living at home with her daughter.  Her speech is mostly improved though she has some word hesitancy and forgets occasional words.  She is mostly fluent when she speaks.  Her understanding is also improving though it is not back to normal.  The patient's blood pressure has been well controlled and today it is 140/76.  She complains of some intermittent dizziness as well as anxiety.  She complains of some tingling on her head on the surgical site.  This is intermittent and not very bothersome.  She is currently getting home speech therapy.  She was started on Zoloft 25 mg by primary care physician recently but daughter feels it does not help and perhaps may have caused new side effects.    ROS:   14 system review of systems is positive for those listed in HPI and all other systems negative  PMH:  Past Medical History:  Diagnosis Date  . Anxiety    on meds  . Arthritis    bilateral knees  . Cataract    RIGHT eye  . Depression    on meds  . Herpes zoster kerstitis s/p corneal transplant 2015   . Hypertension    on meds  . Right knee osteoarthritis   . Strabismus    due to eye injury as a child  . Stroke [facility]) 07/2019   stroke with craniotomy    Social History:  Social History   Socioeconomic History  . Marital status: Married    Spouse name: Not on file  . Number of children: Not on file  . Years of education: Not on file  . Highest education level: Not on file  Occupational History  . Not on file  Tobacco Use  . Smoking status:  Never Smoker  . Smokeless tobacco: Never Used  Vaping Use  . Vaping Use: Never used  Substance and Sexual Activity  . Alcohol use: Never  . Drug use: Never  . Sexual activity: Not on file  Other Topics Concern  . Not on file  Social History Narrative  . Not on file   Social Determinants of Health   Financial Resource Strain: Not on file  Food Insecurity: Not on file  Transportation Needs: Not on file  Physical Activity: Not on file  Stress: Not on file  Social Connections: Not on file  Intimate Partner Violence: Not on file    Medications:   Current Outpatient Medications on File Prior to Visit  Medication Sig Dispense Refill  . amLODipine (NORVASC) 10 MG tablet Take 1 tablet (10 mg total) by mouth daily. 90 tablet 3  . lisinopril (ZESTRIL) 20 MG tablet Take 1 tablet (20 mg total) by mouth daily. 90 tablet 3  . Multiple Vitamin (MULTIVITAMIN WITH MINERALS) TABS tablet Take 1 tablet by mouth daily.    Marland Kitchen PARoxetine (PAXIL) 20 MG tablet Take 1 tablet (20 mg total) by mouth daily. 90 tablet 3  . Polyethyl Glycol-Propyl Glycol (SYSTANE  ULTRA) 0.4-0.3 % SOLN Apply 1-2 drops to eye daily as needed. 20 mL 0  . prednisoLONE acetate (PRED FORTE) 1 % ophthalmic suspension Place 1 drop into both eyes in the morning and at bedtime. 10 mL 0   No current facility-administered medications on file prior to visit.    Allergies:  No Known Allergies  Physical Exam Today's Vitals   07/17/20 1237  BP: 121/68  Pulse: 80  Weight: 164 lb (74.4 kg)  Height: 5' (1.524 m)   Body mass index is 32.03 kg/m.  General: well developed, well nourished pleasant Spanish-speaking middle-aged Hispanic lady, seated, in no evident distress Head: head normocephalic and craniotomy scar noted in the left parietal region. Neck: supple with no carotid or supraclavicular bruits Cardiovascular: regular rate and rhythm, no murmurs Musculoskeletal: no deformity Skin:  no rash/petichiae Vascular:  Normal pulses  all extremities  Neurologic Exam  Mental Status: Awake and fully alert.  Spanish-speaking only but her daughter no issues currently with speech and language.  Oriented to place and time. Recent memory subjectively impaired and remote memory intact. Attention span, concentration and fund of knowledge appropriate during visit. Mood and affect appropriate.  Cranial Nerves: Pupils equal, briskly reactive to light. Extraocular movements full without nystagmus. Visual fields right homonymous hemianopia.  Mild left eye proptosis.  Hearing intact. Facial sensation intact.  Mild right lower facial asymmetry when she smiles., tongue, palate moves normally and symmetrically.  Motor: Normal bulk and tone. Normal strength in all tested extremity muscles. Sensory.: intact to touch ,pinprick .position and vibratory sensation.  Coordination: Rapid alternating movements normal in all extremities. Finger-to-nose and heel-to-shin performed accurately bilaterally. Gait and Station: Arises from chair without difficulty. Stance is normal. Gait demonstrates normal stride length and balance without use of assistive device.  Reflexes: 1+ and symmetric. Toes downgoing.       ASSESSMENT/PLAN: 70 year old lady with left parietal large intracerebral hemorrhage likely of hypertensive etiology with left-sided acute subdural hematoma with cytotoxic edema and midline shift requiring craniotomy in April 2021.  Recent short lasting episode of aphasia possible sz vs TIA     Aphasia episode -MRI unremarkable -Obtain EEG to assess for underlying seizures -as this was a one incident, will hold off on starting any type of AED at this present time -Possible TIA -recommend initiating aspirin 81 mg daily and restart atorvastatin 10 mg daily for secondary stroke prevention -Obtain lipid panel and A1c today -per Dr. Leonie Man recommendations, obtain carotid ultrasound for surveillance monitoring  L PARIETAL ICH -Residual cognitive  impairment right peripheral visual impairment -advised daughter to contact neuro rehab to restart therapies as it does not appear as she was actually discharged. Per daughter request, referred to neuro-ophthalmology although extensive ophthalmology history prior to stroke which may be contributing to some of her visual complaints -Repeat CT head 10/16/2019 showed resolution of prior ICH with residual encephalomalacia -Maintain strict control of HTN with BP goal <130/90 and HLD with LDL goal<70    Follow-up in 6 months or call earlier if needed  CC:  [place] provider: Dr. Nicholes Stairs, MD    I spent 40 minutes of face-to-face and non-face-to-face time with patient and daughter assisting with interpretation.  This included previsit chart review, lab review, study review, order entry, electronic health record documentation, patient and daughter education and discussion regarding recent aphasia episode and possible etiologies, history of prior stroke, residual deficits, importance of managing stroke risk factors, secondary stroke prevention measures and answered all other questions to patient  and daughter satisfaction  Frann Rider, AGNP-BC  [facility] [address] [place], [place] [address]  Phone [phone] Fax [phone] Note: This document was prepared with digital dictation and possible smart phrase technology. Any transcriptional errors that result from this process are unintentional.

## 2020-07-18 LAB — LIPID PANEL
Chol/HDL Ratio: 3.8 ratio (ref 0.0–4.4)
Cholesterol, Total: 238 mg/dL — ABNORMAL HIGH (ref 100–199)
HDL: 63 mg/dL (ref >39)
LDL Chol Calc (NIH): 153 mg/dL — ABNORMAL HIGH (ref 0–99)
Triglycerides: 125 mg/dL (ref 0–149)
VLDL Cholesterol Cal: 22 mg/dL (ref 5–40)

## 2020-07-18 LAB — HEMOGLOBIN A1C
Est. average glucose Bld gHb Est-mCnc: 117 mg/dL
Hgb A1c MFr Bld: 5.7 % — ABNORMAL HIGH (ref 4.8–5.6)

## 2020-07-20 NOTE — Progress Notes (Signed)
I agree with the above plan 

## 2020-07-21 ENCOUNTER — Other Ambulatory Visit: Payer: Self-pay | Admitting: Adult Health

## 2020-07-21 ENCOUNTER — Telehealth: Payer: Self-pay

## 2020-07-21 MED ORDER — ATORVASTATIN CALCIUM 40 MG PO TABS: 40.0000 mg | ORAL_TABLET | Freq: Every day | ORAL | 3 refills | Status: DC

## 2020-07-21 NOTE — Telephone Encounter (Signed)
-----   Message from Frann Rider, NP sent at 07/21/2020  6:51 AM EDT ----- Please advise patient to restart atorvastatin but recommend increasing dosage from 10mg  to 40mg  daily - new order will be placed. Thank you

## 2020-07-21 NOTE — Telephone Encounter (Signed)
Contacted pt and spoke to daughter, per DPR who speaks english, informed them that [name] wants her to restart atorvastatin but recommend increasing dosage from 10mg  to 40mg  daily - new order will be placed. Advised her to call the office with questions, she understood.

## 2020-07-23 ENCOUNTER — Telehealth: Payer: Self-pay | Admitting: Occupational Therapy

## 2020-07-23 NOTE — Telephone Encounter (Signed)
Therapist left a message for patient via interpreter line(#[phone]) that her OT visit for next week would be cancelled and she was going to be discharged as she was scheduled outside of plan of care. Therapist instructed pt to have her daughter to call back at [phone] if she has any questions. (OT will complete d/c summary if she does not hear from patient by end of next week).  Theone Murdoch, OTR/L [phone] Phone: [phone] 1:40 PM 07/23/20

## 2020-07-29 ENCOUNTER — Ambulatory Visit: Payer: Medicare Other | Admitting: Occupational Therapy

## 2020-07-31 ENCOUNTER — Other Ambulatory Visit: Payer: Self-pay

## 2020-07-31 ENCOUNTER — Ambulatory Visit (HOSPITAL_COMMUNITY)
Admission: RE | Admit: 2020-07-31 | Discharge: 2020-07-31 | Disposition: A | Discharge status: Home / Self Care | Payer: Medicare Other | Source: Ambulatory Visit | Attending: Adult Health | Admitting: Adult Health

## 2020-07-31 DIAGNOSIS — I611 Nontraumatic intracerebral hemorrhage in hemisphere, cortical: Secondary | ICD-10-CM

## 2020-07-31 DIAGNOSIS — R4701 Aphasia: Secondary | ICD-10-CM | POA: Insufficient documentation

## 2020-07-31 NOTE — Progress Notes (Signed)
Carotid duplex bilateral study completed.   Please see CV Proc for preliminary results.   Maryetta Shafer, RDMS, RVT  

## 2020-08-04 ENCOUNTER — Telehealth: Payer: Self-pay

## 2020-08-04 NOTE — Telephone Encounter (Signed)
Contacted pt, and daughter per DPR, neither answered. LVM informing her that recent carotid duplex looked good with minimal to no stenosis. Advised to call the office with further questions.

## 2020-08-04 NOTE — Telephone Encounter (Signed)
-----   Message from Frann Rider, NP sent at 08/04/2020 12:04 PM EDT ----- Please advise patient that recent carotid duplex looked good with minimal to no stenosis

## 2020-08-29 ENCOUNTER — Ambulatory Visit
Admission: RE | Admit: 2020-08-29 | Discharge: 2020-08-29 | Disposition: A | Discharge status: Home / Self Care | Payer: Medicare Other | Source: Ambulatory Visit | Attending: Internal Medicine | Admitting: Internal Medicine

## 2020-08-29 ENCOUNTER — Other Ambulatory Visit: Payer: Self-pay

## 2020-08-29 DIAGNOSIS — M85851 Other specified disorders of bone density and structure, right thigh: Secondary | ICD-10-CM | POA: Diagnosis not present

## 2020-08-29 DIAGNOSIS — Z13820 Encounter for screening for osteoporosis: Secondary | ICD-10-CM

## 2020-08-29 DIAGNOSIS — Z780 Asymptomatic menopausal state: Secondary | ICD-10-CM | POA: Diagnosis not present

## 2020-09-11 ENCOUNTER — Other Ambulatory Visit: Payer: Self-pay

## 2020-09-11 ENCOUNTER — Encounter: Payer: Self-pay | Admitting: Orthopaedic Surgery

## 2020-09-11 ENCOUNTER — Ambulatory Visit (INDEPENDENT_AMBULATORY_CARE_PROVIDER_SITE_OTHER): Payer: Medicare Other | Admitting: Orthopaedic Surgery

## 2020-09-11 VITALS — Ht <= 58 in | Wt 165.0 lb

## 2020-09-11 DIAGNOSIS — M1711 Unilateral primary osteoarthritis, right knee: Secondary | ICD-10-CM

## 2020-09-11 NOTE — Progress Notes (Signed)
Office Visit Note   Patient: Michelle Carrillo           Date of Birth: March 31, 1951           MRN: [id number] Visit Date: 09/11/2020              Requested by: No referring provider defined for this encounter. PCP: No primary care provider on file.   Assessment & Plan: Visit Diagnoses:  1. Primary osteoarthritis of right knee     Plan: Impression is end-stage right knee tricompartmental DJD.  We discussed at length about the details of the surgery and the postoperative rehabilitation and recovery that is required to have an optimal result.  We talked about the risks including but not limited to infection, DVT, arthrofibrosis, incomplete relief of pain, component loosening.  Questions encouraged and answered.  She would like to move forward with surgery.  Debbie met with her today.  Language barrier increased the complexity of the visit.  Follow-Up Instructions: Return for Postop.   Orders:  No orders of the defined types were placed in this encounter.  No orders of the defined types were placed in this encounter.     Procedures: No procedures performed   Clinical Data: No additional findings.   Subjective: Chief Complaint  Patient presents with  . Right Knee - Pain    Patient is a 70 year old Hispanic female who returns today with her daughter and language interpreter to discuss more in depth about knee replacement surgery.  She has been thinking about this and has decided that she would like to move forward with the surgery.  She has severe and chronic pain that is limiting her daily activities.  She has trouble sleeping at nighttime.  Denies any changes to medical history.   Review of Systems  Constitutional: Negative.   HENT: Negative.   Eyes: Negative.   Respiratory: Negative.   Cardiovascular: Negative.   Endocrine: Negative.   Musculoskeletal: Negative.   Neurological: Negative.   Hematological: Negative.   Psychiatric/Behavioral: Negative.   All  other systems reviewed and are negative.    Objective: Vital Signs: Ht 4' 10"  (1.473 m)   Wt 165 lb (74.8 kg)   BMI 34.49 kg/m   Physical Exam Vitals and nursing note reviewed.  Constitutional:      Appearance: She is well-developed.  Pulmonary:     Effort: Pulmonary effort is normal.  Skin:    General: Skin is warm.     Capillary Refill: Capillary refill takes less than 2 seconds.  Neurological:     Mental Status: She is alert and oriented to person, place, and time.  Psychiatric:        Behavior: Behavior normal.        Thought Content: Thought content normal.        Judgment: Judgment normal.     Ortho Exam Right knee shows pain and crepitus with range of motion that is limited.  No joint effusion.  Collaterals and cruciates are stable. Specialty Comments:  No specialty comments available.  Imaging: No results found.   PMFS History: Patient Active Problem List   Diagnosis Date Noted  . Vitamin D deficiency 04/24/2020  . Eye pain 03/13/2020  . Hyperkalemia 01/14/2020  . Chronic RUQ pain 01/14/2020  . Sensation of fullness in ear, bilateral 01/11/2020  . Aphasia due to acute stroke ([place]) 08/23/2019  . Hyperlipemia 08/23/2019  . ICH (intracerebral hemorrhage) ([facility]) L parietal w/ L SDH s/p crani 08/09/2019  .  Subdural hematoma, acute ([place]) 08/09/2019  . Healthcare maintenance 06/20/2019  . History of strabismus surgery 03/31/2019  . Moderate, recurrent MDD 06/06/2018  . 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 Osteoarthritis of right knee 06/05/2018  . Essential hypertension 06/05/2018  . Anxiety 06/05/2018  . Age-related nuclear cataract of both eyes 11/26/2014  . Diplopia 11/26/2014  . Monocular esotropia of left eye 11/26/2014  . Corneal transplant failure 10/27/2011   Past Medical History:  Diagnosis Date  . Anxiety    on meds  . Arthritis    bilateral knees  . Cataract    RIGHT eye  . Depression    on meds   . Herpes zoster kerstitis s/p corneal transplant 2015   . Hypertension    on meds  . Right knee osteoarthritis   . Strabismus    due to eye injury as a child  . Stroke [facility]) 07/2019   stroke with craniotomy    Family History  Problem Relation Age of Onset  . Diabetes Mother   . Ovarian cancer Mother   . High blood pressure Sister   . Diabetes Sister   . Diabetes Brother   . Stomach cancer Maternal Grandmother 60  . Colon polyps Neg Hx   . Colon cancer Neg Hx   . Rectal cancer Neg Hx   . Esophageal cancer Neg Hx     Past Surgical History:  Procedure Laterality Date  . CESAREAN SECTION      x 2   . CHOLECYSTECTOMY    . COLONOSCOPY  04/22/2020  . CORNEAL TRANSPLANT Left    2006 and 2016  . CRANIOTOMY Left 08/09/2019   Procedure: CRANIOTOMY HEMATOMA EVACUATION SUBDURAL;  Surgeon: Ashok Pall, MD;  Location: [facility];  Service: Neurosurgery;  Laterality: Left;  left   Social History   Occupational History  . Not on file  Tobacco Use  . Smoking status: Never Smoker  . Smokeless tobacco: Never Used  Vaping Use  . Vaping Use: Never used  Substance and Sexual Activity  . Alcohol use: Never  . Drug use: Never  . Sexual activity: Not on file

## 2020-09-16 ENCOUNTER — Other Ambulatory Visit: Payer: Self-pay | Admitting: Internal Medicine

## 2020-09-16 DIAGNOSIS — I10 Essential (primary) hypertension: Secondary | ICD-10-CM

## 2020-09-29 ENCOUNTER — Other Ambulatory Visit: Payer: Self-pay

## 2020-09-30 ENCOUNTER — Encounter: Payer: Self-pay | Admitting: Student

## 2020-09-30 ENCOUNTER — Ambulatory Visit (INDEPENDENT_AMBULATORY_CARE_PROVIDER_SITE_OTHER): Payer: Medicare Other | Admitting: Student

## 2020-09-30 ENCOUNTER — Other Ambulatory Visit: Payer: Self-pay

## 2020-09-30 VITALS — BP 134/65 | HR 85 | Temp 98.5°F | Ht <= 58 in | Wt 165.4 lb

## 2020-09-30 DIAGNOSIS — Z1231 Encounter for screening mammogram for malignant neoplasm of breast: Secondary | ICD-10-CM | POA: Diagnosis not present

## 2020-09-30 DIAGNOSIS — Z0000 Encounter for general adult medical examination without abnormal findings: Secondary | ICD-10-CM

## 2020-09-30 DIAGNOSIS — I10 Essential (primary) hypertension: Secondary | ICD-10-CM

## 2020-09-30 DIAGNOSIS — M1711 Unilateral primary osteoarthritis, right knee: Secondary | ICD-10-CM

## 2020-09-30 NOTE — Progress Notes (Signed)
   CC: medical clearance for surgery  HPI:  Michelle Carrillo is a 70 y.o. with medical history as below presenting to [facility] for surgical clearance for upcoming total R knee replacement.  Please see problem-based list for further details, assessments, and plans.  Past Medical History:  Diagnosis Date   Anxiety    on meds   Arthritis    bilateral knees   Cataract    RIGHT eye   Depression    on meds   Herpes zoster kerstitis s/p corneal transplant 2015    Hypertension    on meds   Right knee osteoarthritis    Strabismus    due to eye injury as a child   Stroke ([place]) 07/2019   stroke with craniotomy   Review of Systems:  As per HPI  Physical Exam:  Vitals:   09/30/20 1425  BP: 134/65  Pulse: 85  Temp: 98.5 F (36.9 C)  TempSrc: Oral  SpO2: 97%  Weight: 165 lb 6.4 oz (75 kg)  Height: 4\' 10"  (1.473 m)   General: Resting comfortably in chair, no acute distress CV: Regular rate, rhythm. No murmurs, rubs, gallops appreciated. Distal extremities warm. Pulm: Normal work of breathing on room air. Clear to auscultation bilaterally MSK: Normal bulk, tone. No pitting edema bilaterally. R knee edematous, no bony tenderness. Skin: Warm, dry. No rashes or lesions appreciated. Neuro: Awake, alert, answering questions appropriately. Psych: Normal mood, affect, speech.  Assessment & Plan:   See Encounters Tab for problem based charting.  Patient discussed with Dr. Philipp Ovens

## 2020-09-30 NOTE — Patient Instructions (Signed)
[name], me alegre de verle hoy!  Seguimiento 6 months  Recordar:  - Talk with your surgeon about the aspirin. You might need to stop this a few days before surgery.   - Hable con su cirujano sobre la aspirina. Es posible que deba dejar de hacerlo unos das antes de la [place].  Asegrese de llegar 15 minutos antes de su prxima cita. Si llega tarde, es posible que se IT trainer.  Estamos deseando verle la proxima vez. [place] a [phone] si tiene preguntas o preocupaciones. El mejor tiempo para llamar es lunes a viernes, 9:00am - 4:00pm, pero hay alguien esta a su disposcion para lo que halga falta a cualquier hora. Si es fuera del horario de atencin o durante el fin de [place], llame al nmero principal del hospital y pregunte por el residente de guardia de medicina interna. SI necesita recambio de los [employer] , llama su farmacia una semana antes de fecha de finalizacion de la receta. La farmacia nos llamara para la solicitud.  Gracias que nos permite tomar parte en su asistencia medica. Deseamos lo mejor!  Debbe Odea, MD

## 2020-10-01 NOTE — Assessment & Plan Note (Signed)
BP Readings from Last 3 Encounters:  09/30/20 134/65  07/17/20 121/68  07/13/20 116/61   Blood pressure remains controlled. Denies chest pain, dyspnea, headaches, leg swelling. Will continue with her current regimen. - Amlodipine 10mg  daily - Lisinopril 20mg  daily

## 2020-10-01 NOTE — Assessment & Plan Note (Signed)
Mammogram ordered today 

## 2020-10-01 NOTE — Assessment & Plan Note (Signed)
Ms. Wirkkala is presenting today to discuss surgical clearance for upcoming total R knee replacement. She reports her knee has been bothering her for a long time and is ready for improvement of symptoms. She mentions that her knee pain limits her functional status, although previously she was very active. Describes being able to walk around the block as well as complete household chores. Currently her daughters have been helping her with the household chores. She reports that she is asymptomatic, denying any chest pain, dyspnea, palpitations, lightheadedness, abdominal pain, nausea, vomiting, or diarrhea.   RCRI 0. DASI 24.2, equivalent to 5.72 METs. However, appears her knee is the biggest limiting factor in her functional status. She does not have diabetes and her blood pressure has been well-controlled. Overall believe she is medically cleared for surgery. She does take aspirin currently, which can be temporarily held per surgeon's discretion.  - Medically cleared for R knee replacement on 10/10/20

## 2020-10-02 NOTE — Progress Notes (Signed)
Internal Medicine Clinic Attending ? ?Case discussed with Dr. Braswell  At the time of the visit.  We reviewed the resident?s history and exam and pertinent patient test results.  I agree with the assessment, diagnosis, and plan of care documented in the resident?s note.  ?

## 2020-10-06 NOTE — Progress Notes (Signed)
Surgical Instructions    Your procedure is scheduled on Friday, July 1st, 2022.  Report to [facility] Main Entrance "A" at 08:20 A.M., then check in with the Admitting office.  Call this number if you have problems the morning of surgery:  [phone]   If you have any questions prior to your surgery date call [phone]: Open Monday-Friday 8am-4pm    Remember:  Do not eat after midnight the night before your surgery  You may drink clear liquids until 07:20 the morning of your surgery.   Clear liquids allowed are: Water, Non-Citrus Juices (without pulp), Carbonated Beverages, Clear Tea, Black Coffee Only, and Gatorade    Take these medicines the morning of surgery with A SIP OF WATER:  amLODipine (NORVASC) atorvastatin (LIPITOR)  PARoxetine (PAXIL) - if needed Polyethyl Glycol-Propyl Glycol (SYSTANE ULTRA) - if needed  Follow your surgeon's instructions on when to stop Aspirin.  If no instructions were given by your surgeon then you will need to call the office to get those instructions.     As of today, STOP taking any Aspirin (unless otherwise instructed by your surgeon) Aleve, Naproxen, Ibuprofen, Motrin, Advil, Goody's, BC's, all herbal medications, fish oil, and all vitamins.          Do not wear jewelry or makeup Do not wear lotions, powders, perfumes, or deodorant. Do not shave 48 hours prior to surgery. Do not bring valuables to the hospital. DO Not wear nail polish, gel polish, artificial nails, or any other type of covering on natural nails including finger and toenails. If patients have artificial nails, gel coating, etc. that need to be removed by a nail salon please have this removed prior to surgery or surgery may need to be canceled/delayed if the surgeon/ anesthesia feels like the patient is unable to be adequately monitored.             [facility] is not responsible for any belongings or valuables.  Do NOT Smoke (Tobacco/Vaping) or drink Alcohol 24 hours  prior to your procedure If you use a CPAP at night, you may bring all equipment for your overnight stay.   Contacts, glasses, dentures or bridgework may not be worn into surgery, please bring cases for these belongings   For patients admitted to the hospital, discharge time will be determined by your treatment team.   Patients discharged the day of surgery will not be allowed to drive home, and someone needs to stay with them for 24 hours.  ONLY 1 SUPPORT PERSON MAY BE PRESENT WHILE YOU ARE IN SURGERY. IF YOU ARE TO BE ADMITTED ONCE YOU ARE IN YOUR ROOM YOU WILL BE ALLOWED TWO (2) VISITORS.  Minor children may have two parents present. Special consideration for safety and communication needs will be reviewed on a case by case basis.  Special instructions:    Oral Hygiene is also important to reduce your risk of infection.  Remember - BRUSH YOUR TEETH THE MORNING OF SURGERY WITH YOUR REGULAR TOOTHPASTE   [place]- Preparing For Surgery  Before surgery, you can play an important role. Because skin is not sterile, your skin needs to be as free of germs as possible. You can reduce the number of germs on your skin by washing with CHG (chlorahexidine gluconate) Soap before surgery.  CHG is an antiseptic cleaner which kills germs and bonds with the skin to continue killing germs even after washing.     Please do not use if you have an allergy to  CHG or antibacterial soaps. If your skin becomes reddened/irritated stop using the CHG.  Do not shave (including legs and underarms) for at least 48 hours prior to first CHG shower. It is OK to shave your face.  Please follow these instructions carefully.     Shower the NIGHT BEFORE SURGERY and the MORNING OF SURGERY with CHG Soap.   If you chose to wash your hair, wash your hair first as usual with your normal shampoo. After you shampoo, rinse your hair and body thoroughly to remove the shampoo.  Then [employer] and genitals (private parts) with your  normal soap and rinse thoroughly to remove soap.  After that Use CHG Soap as you would any other liquid soap. You can apply CHG directly to the skin and wash gently with a scrungie or a clean washcloth.   Apply the CHG Soap to your body ONLY FROM THE NECK DOWN.  Do not use on open wounds or open sores. Avoid contact with your eyes, ears, mouth and genitals (private parts). Wash Face and genitals (private parts)  with your normal soap.   Wash thoroughly, paying special attention to the area where your surgery will be performed.  Thoroughly rinse your body with warm water from the neck down.  DO NOT shower/wash with your normal soap after using and rinsing off the CHG Soap.  Pat yourself dry with a CLEAN TOWEL.  Wear CLEAN PAJAMAS to bed the night before surgery  Place CLEAN SHEETS on your bed the night before your surgery  DO NOT SLEEP WITH PETS.   Day of Surgery:  Take a shower with CHG soap. Wear Clean/Comfortable clothing the morning of surgery Do not apply any deodorants/lotions.   Remember to brush your teeth WITH YOUR REGULAR TOOTHPASTE.   Please read over the following fact sheets that you were given.

## 2020-10-07 ENCOUNTER — Other Ambulatory Visit: Payer: Self-pay

## 2020-10-07 ENCOUNTER — Other Ambulatory Visit: Payer: Self-pay | Admitting: Physician Assistant

## 2020-10-07 ENCOUNTER — Ambulatory Visit (HOSPITAL_COMMUNITY)
Admission: RE | Admit: 2020-10-07 | Discharge: 2020-10-07 | Disposition: A | Discharge status: Home / Self Care | Payer: Medicare Other | Source: Ambulatory Visit | Attending: Physician Assistant | Admitting: Physician Assistant

## 2020-10-07 ENCOUNTER — Telehealth: Payer: Self-pay | Admitting: Orthopaedic Surgery

## 2020-10-07 ENCOUNTER — Other Ambulatory Visit (HOSPITAL_COMMUNITY): Payer: Medicare Other

## 2020-10-07 ENCOUNTER — Encounter (HOSPITAL_COMMUNITY): Payer: Self-pay

## 2020-10-07 ENCOUNTER — Encounter (HOSPITAL_COMMUNITY)
Admission: RE | Admit: 2020-10-07 | Discharge: 2020-10-07 | Disposition: A | Discharge status: Home / Self Care | Payer: Medicare Other | Source: Ambulatory Visit | Attending: Orthopaedic Surgery | Admitting: Orthopaedic Surgery

## 2020-10-07 DIAGNOSIS — Z01818 Encounter for other preprocedural examination: Secondary | ICD-10-CM | POA: Diagnosis not present

## 2020-10-07 DIAGNOSIS — Z20822 Contact with and (suspected) exposure to covid-19: Secondary | ICD-10-CM | POA: Insufficient documentation

## 2020-10-07 DIAGNOSIS — M1711 Unilateral primary osteoarthritis, right knee: Secondary | ICD-10-CM | POA: Insufficient documentation

## 2020-10-07 LAB — CBC WITH DIFFERENTIAL/PLATELET
Abs Immature Granulocytes: 0.04 10*3/uL (ref 0.00–0.07)
Basophils Absolute: 0 10*3/uL (ref 0.0–0.1)
Basophils Relative: 0 %
Eosinophils Absolute: 0.1 10*3/uL (ref 0.0–0.5)
Eosinophils Relative: 1 %
HCT: 40.2 % (ref 36.0–46.0)
Hemoglobin: 13.1 g/dL (ref 12.0–15.0)
Immature Granulocytes: 0 %
Lymphocytes Relative: 21 %
Lymphs Abs: 1.9 10*3/uL (ref 0.7–4.0)
MCH: 31.3 pg (ref 26.0–34.0)
MCHC: 32.6 g/dL (ref 30.0–36.0)
MCV: 95.9 fL (ref 80.0–100.0)
Monocytes Absolute: 0.6 10*3/uL (ref 0.1–1.0)
Monocytes Relative: 7 %
Neutro Abs: 6.4 10*3/uL (ref 1.7–7.7)
Neutrophils Relative %: 71 %
Platelets: 262 10*3/uL (ref 150–400)
RBC: 4.19 MIL/uL (ref 3.87–5.11)
RDW: 13 % (ref 11.5–15.5)
WBC: 9.1 10*3/uL (ref 4.0–10.5)
nRBC: 0 % (ref 0.0–0.2)

## 2020-10-07 LAB — COMPREHENSIVE METABOLIC PANEL
ALT: 17 U/L (ref 0–44)
AST: 19 U/L (ref 15–41)
Albumin: 3.5 g/dL (ref 3.5–5.0)
Alkaline Phosphatase: 111 U/L (ref 38–126)
Anion gap: 9 (ref 5–15)
BUN: 13 mg/dL (ref 8–23)
CO2: 25 mmol/L (ref 22–32)
Calcium: 8.9 mg/dL (ref 8.9–10.3)
Chloride: 106 mmol/L (ref 98–111)
Creatinine, Ser: 0.62 mg/dL (ref 0.44–1.00)
GFR, Estimated: >60 mL/min (ref >60)
Glucose, Bld: 88 mg/dL (ref 70–99)
Potassium: 4.1 mmol/L (ref 3.5–5.1)
Sodium: 140 mmol/L (ref 135–145)
Total Bilirubin: 0.4 mg/dL (ref 0.3–1.2)
Total Protein: 7.1 g/dL (ref 6.5–8.1)

## 2020-10-07 LAB — URINALYSIS, ROUTINE W REFLEX MICROSCOPIC
Bilirubin Urine: NEGATIVE
Glucose, UA: NEGATIVE mg/dL
Hgb urine dipstick: NEGATIVE
Ketones, ur: NEGATIVE mg/dL
Leukocytes,Ua: NEGATIVE
Nitrite: NEGATIVE
Protein, ur: NEGATIVE mg/dL
Specific Gravity, Urine: 1.015 (ref 1.005–1.030)
pH: 5 (ref 5.0–8.0)

## 2020-10-07 LAB — APTT: aPTT: 30 seconds (ref 24–36)

## 2020-10-07 LAB — TYPE AND SCREEN
ABO/RH(D): O POS
Antibody Screen: NEGATIVE

## 2020-10-07 LAB — SURGICAL PCR SCREEN
MRSA, PCR: NEGATIVE
Staphylococcus aureus: NEGATIVE

## 2020-10-07 LAB — PROTIME-INR
INR: 1 (ref 0.8–1.2)
Prothrombin Time: 13.3 seconds (ref 11.4–15.2)

## 2020-10-07 LAB — SARS CORONAVIRUS 2 (TAT 6-24 HRS): SARS Coronavirus 2: NEGATIVE

## 2020-10-07 LAB — NO BLOOD PRODUCTS

## 2020-10-07 MED ORDER — DOCUSATE SODIUM 100 MG PO CAPS: 100.0000 mg | ORAL_CAPSULE | Freq: Every day | ORAL | 2 refills | Status: AC | PRN

## 2020-10-07 MED ORDER — METHOCARBAMOL 500 MG PO TABS: 500.0000 mg | ORAL_TABLET | Freq: Two times a day (BID) | ORAL | 0 refills | Status: DC | PRN

## 2020-10-07 MED ORDER — ASPIRIN EC 81 MG PO TBEC: 81.0000 mg | DELAYED_RELEASE_TABLET | Freq: Two times a day (BID) | ORAL | 0 refills | Status: DC

## 2020-10-07 MED ORDER — OXYCODONE-ACETAMINOPHEN 5-325 MG PO TABS: 1.0000 | ORAL_TABLET | Freq: Four times a day (QID) | ORAL | 0 refills | Status: DC | PRN

## 2020-10-07 MED ORDER — ONDANSETRON HCL 4 MG PO TABS: 4.0000 mg | ORAL_TABLET | Freq: Three times a day (TID) | ORAL | 0 refills | Status: DC | PRN

## 2020-10-07 NOTE — Progress Notes (Addendum)
PCP - Sanjuan Dame, MD Cardiologist - denies  PPM/ICD - denies Device Orders - N/A Rep Notified - N/A  Chest x-ray - 10/07/2020 EKG - 10/07/2020 Stress Test - denies ECHO - 08/11/2019 Cardiac Cath - 12/13/2001  Sleep Study - denies CPAP - N/A  Fasting Blood Sugar - N/A  Blood Thinner Instructions: N/A  Patient was instructed to follow her surgeon's instructions on when to stop Aspirin. Patient and her daughter will call MD office to receive instructions.   Aspirin Instructions: Patient was instructed: As of today, STOP taking any Aspirin (unless otherwise instructed by your surgeon) Aleve, Naproxen, Ibuprofen, Motrin, Advil, Goody's, BC's, all herbal medications, fish oil, and all vitamins.  ERAS Protcol - Yes PRE-SURGERY Ensure or G2- yes  COVID TEST- 10/07/2020   Anesthesia review: yes; medical clearance; abnormal EKG; patient is refusing blood but is accepting albumin and albumin-containing products.  Patient denies shortness of breath, fever, cough and chest pain at PAT appointment   All instructions explained to the patient, with a verbal understanding of the material. Patient agrees to go over the instructions while at home for a better understanding. Patient also instructed to self quarantine after being tested for COVID-19. The opportunity to ask questions was provided.

## 2020-10-07 NOTE — Telephone Encounter (Signed)
Pt is having surgery on 10/10/20 and she has been taking baby asprin; so the pt is wondering when and if she should stop taking the rx. Pt would like a CB ASAP to update her.   [phone]* Ok to speak with daughter

## 2020-10-07 NOTE — Progress Notes (Signed)
Patient was in PAT today and she said she is refusing blood but she is fine to receive Albumin if this will be the case. This Probation officer called Dr. Erlinda Hong office x 2 and left two messages and also a message was send to Dr. Erlinda Hong via in-box.

## 2020-10-08 NOTE — Telephone Encounter (Signed)
No she can continue the aspirin

## 2020-10-08 NOTE — Telephone Encounter (Signed)
Called daughter to advise patient can continue taking aspirin. No answer LMOM.

## 2020-10-08 NOTE — Anesthesia Preprocedure Evaluation (Addendum)
Anesthesia Evaluation  Patient identified by MRN, date of birth, ID band Patient awake    Reviewed: Allergy & Precautions, NPO status , Patient's Chart, lab work & pertinent test results  Airway Mallampati: III  TM Distance: >3 FB Neck ROM: Full    Dental no notable dental hx. (+) Teeth Intact, Dental Advisory Given   Pulmonary neg pulmonary ROS,    Pulmonary exam normal breath sounds clear to auscultation       Cardiovascular hypertension, Pt. on medications Normal cardiovascular exam Rhythm:Regular Rate:Normal  TTE 08/2019 1. Normal LV systolic function; grade 1 diastolic dysfunction; mild MR;  mild to moderate TR.  2. Left ventricular ejection fraction, by estimation, is 60 to 65%. The  left ventricle has normal function. The left ventricle has no regional  wall motion abnormalities. Left ventricular diastolic parameters are  consistent with Grade I diastolic  dysfunction (impaired relaxation).  3. Right ventricular systolic function is normal. The right ventricular  size is normal.  4. The mitral valve is normal in structure. Mild mitral valve  regurgitation. No evidence of mitral stenosis.  5. Tricuspid valve regurgitation is mild to moderate.  6. The aortic valve is tricuspid. Aortic valve regurgitation is not  visualized. No aortic stenosis is present.  7. The inferior vena cava is normal in size with greater than 50%  respiratory variability, suggesting right atrial pressure of 3 mmHg.    Neuro/Psych PSYCHIATRIC DISORDERS Anxiety Depression hx intracerebral hemorrhage with L subdural hematoma requiring craniotomy April 2021 (thought due to HTN, residual cognitive and visual impairment), aphasia episode April 2022 (suspected TIA) TIACVA    GI/Hepatic negative GI ROS, Neg liver ROS,   Endo/Other  negative endocrine ROS  Renal/GU negative Renal ROS  negative genitourinary   Musculoskeletal  (+) Arthritis ,    Abdominal   Peds  Hematology  (+) REFUSES BLOOD PRODUCTS (will accept albumin),   Anesthesia Other Findings   Reproductive/Obstetrics                           Anesthesia Physical Anesthesia Plan  ASA: 3  Anesthesia Plan: Spinal and Regional   Post-op Pain Management:  Regional for Post-op pain   Induction:   PONV Risk Score and Plan: 2 and Treatment may vary due to age or medical condition, Midazolam, Ondansetron and Dexamethasone  Airway Management Planned: Natural Airway  Additional Equipment:   Intra-op Plan:   Post-operative Plan:   Informed Consent: I have reviewed the patients History and Physical, chart, labs and discussed the procedure including the risks, benefits and alternatives for the proposed anesthesia with the patient or authorized representative who has indicated his/her understanding and acceptance.     Dental advisory given  Plan Discussed with: CRNA  Anesthesia Plan Comments:        Anesthesia Quick Evaluation

## 2020-10-08 NOTE — Progress Notes (Signed)
Anesthesia Chart Review:   Case: [id number] Date/Time: 10/10/20 1007   Procedure: RIGHT TOTAL KNEE ARTHROPLASTY (Right: Knee)   Anesthesia type: Spinal   Pre-op diagnosis: right knee degenerative joing disease   Location: [facility] OR ROOM 06 / [place] OR   Surgeons: Leandrew Koyanagi, MD       DISCUSSION: Michelle Carrillo is 70 years old with hx intracerebral hemorrhage with L subdural hematoma requiring craniotomy April 2021 (thought due to HTN, residual cognitive and visual impairment), aphasia episode April 2022 (suspected TIA), HTN  Michelle Carrillo is declining all blood products except albumin and albumin-containing products   VS: BP 138/71   Pulse 86   Temp 37.1 C (Oral)   Resp 18   Ht 5' (1.524 m)   Wt 74.6 kg   SpO2 98%   BMI 32.13 kg/m   PROVIDERS: - PCP is Orvis Brill, MD. Michelle Carrillo cleared for surgery by Sanjuan Dame, MD on 10/01/20 - [facility] care at [facility]. Last office visit 07/17/20 with Frann Rider, NP   LABS: Labs reviewed: Acceptable for surgery. - HbA1c was 5.7 on 07/17/20  (all labs ordered are listed, but only abnormal results are displayed)  Labs Reviewed  SURGICAL PCR SCREEN  SARS CORONAVIRUS 2 (TAT 6-24 HRS)  CBC WITH DIFFERENTIAL/PLATELET  COMPREHENSIVE METABOLIC PANEL  PROTIME-INR  APTT  URINALYSIS, ROUTINE W REFLEX MICROSCOPIC  TYPE AND SCREEN  NO BLOOD PRODUCTS     IMAGES: CXR 10/07/20: Negative for acute cardiopulmonary disease  MR brain 07/13/20:  1. No acute intracranial abnormality or significant interval change. 2. Stable chronic hemorrhagic encephalomalacia of the left parietal lobe.   EKG 10/07/20: Normal sinus rhythm. Nonspecific ST abnormality   CV: Carotid duplex 07/31/20:  - Right Carotid: The extracranial vessels were near-normal with only minimal wall thickening or plaque.  - Left Carotid: The extracranial vessels were near-normal with only minimal wall thickening or plaque.  - Vertebrals:  Bilateral vertebral arteries demonstrate  antegrade flow.  - Subclavians: Elevated velocities without evidence of stenosis bilaterally.    Echo 08/11/19:   1. Normal LV systolic function; grade 1 diastolic dysfunction; mild MR; mild to moderate TR.   2. Left ventricular ejection fraction, by estimation, is 60 to 65%. The left ventricle has normal function. The left ventricle has no regional wall motion abnormalities. Left ventricular diastolic parameters are consistent with Grade I diastolic dysfunction (impaired relaxation).   3. Right ventricular systolic function is normal. The right ventricular size is normal.   4. The mitral valve is normal in structure. Mild mitral valve regurgitation. No evidence of mitral stenosis.   5. Tricuspid valve regurgitation is mild to moderate.   6. The aortic valve is tricuspid. Aortic valve regurgitation is not visualized. No aortic stenosis is present.   7. The inferior vena cava is normal in size with greater than 50% respiratory variability, suggesting right atrial pressure of 3 mmHg.    Past Medical History:  Diagnosis Date   Anxiety    on meds   Arthritis    bilateral knees   Cataract    RIGHT eye   Depression    on meds   Herpes zoster kerstitis s/p corneal transplant 2015    Hypertension    on meds   Right knee osteoarthritis    Strabismus    due to eye injury as a child   Stroke ([place]) 07/2019   stroke with craniotomy    Past Surgical History:  Procedure Laterality Date   CESAREAN SECTION  x 2    CHOLECYSTECTOMY     COLONOSCOPY  04/22/2020   CORNEAL TRANSPLANT Left    2006 and 2016   CRANIOTOMY Left 08/09/2019   Procedure: CRANIOTOMY HEMATOMA EVACUATION SUBDURAL;  Surgeon: Ashok Pall, MD;  Location: [place];  Service: Neurosurgery;  Laterality: Left;  left   EYE SURGERY     TUBAL LIGATION      MEDICATIONS:  amLODipine (NORVASC) 10 MG tablet   aspirin EC 81 MG tablet   atorvastatin (LIPITOR) 40 MG tablet   docusate sodium (COLACE) 100 MG capsule   lisinopril  (ZESTRIL) 20 MG tablet   methocarbamol (ROBAXIN) 500 MG tablet   Multiple Vitamin (MULTIVITAMIN WITH MINERALS) TABS tablet   ondansetron (ZOFRAN) 4 MG tablet   oxyCODONE-acetaminophen (PERCOCET) 5-325 MG tablet   PARoxetine (PAXIL) 20 MG tablet   Polyethyl Glycol-Propyl Glycol (SYSTANE ULTRA) 0.4-0.3 % SOLN   prednisoLONE acetate (PRED FORTE) 1 % ophthalmic suspension   No current facility-administered medications for this encounter.   Per Dr. Erlinda Hong, Michelle Carrillo can continue ASA perioperatively.    If no changes, I anticipate Michelle Carrillo can proceed with surgery as scheduled.   Willeen Cass, PhD, FNP-BC [facility]/Anesthesiology Phone: [phone] 10/08/2020 4:22 PM

## 2020-10-09 ENCOUNTER — Other Ambulatory Visit: Payer: Self-pay | Admitting: *Deleted

## 2020-10-09 DIAGNOSIS — M1711 Unilateral primary osteoarthritis, right knee: Secondary | ICD-10-CM

## 2020-10-09 MED ORDER — TRANEXAMIC ACID 1000 MG/10ML IV SOLN
2000.0000 mg | INTRAVENOUS | Status: DC
  Filled 2020-10-09 (×2): qty 20

## 2020-10-09 NOTE — Telephone Encounter (Signed)
Called patient's daughter no answer.

## 2020-10-09 NOTE — Telephone Encounter (Signed)
Spoke to [place], patients daughter. Patient can continue taking aspirin.

## 2020-10-10 ENCOUNTER — Ambulatory Visit (HOSPITAL_COMMUNITY): Payer: Medicare Other | Admitting: Emergency Medicine

## 2020-10-10 ENCOUNTER — Observation Stay (HOSPITAL_COMMUNITY): Payer: Medicare Other

## 2020-10-10 ENCOUNTER — Ambulatory Visit (HOSPITAL_COMMUNITY): Payer: Medicare Other | Admitting: Anesthesiology

## 2020-10-10 ENCOUNTER — Observation Stay (HOSPITAL_COMMUNITY)
Admission: RE | Admit: 2020-10-10 | Discharge: 2020-10-11 | Disposition: A | Discharge status: Home / Self Care | Payer: Medicare Other | Attending: Orthopaedic Surgery | Admitting: Orthopaedic Surgery

## 2020-10-10 ENCOUNTER — Encounter (HOSPITAL_COMMUNITY): Payer: Self-pay | Admitting: Orthopaedic Surgery

## 2020-10-10 ENCOUNTER — Other Ambulatory Visit: Payer: Self-pay

## 2020-10-10 ENCOUNTER — Encounter (HOSPITAL_COMMUNITY)
Admission: RE | Disposition: A | Discharge status: Home / Self Care | Payer: Self-pay | Source: Home / Self Care | Attending: Orthopaedic Surgery

## 2020-10-10 DIAGNOSIS — Z96651 Presence of right artificial knee joint: Secondary | ICD-10-CM

## 2020-10-10 DIAGNOSIS — Z8673 Personal history of transient ischemic attack (TIA), and cerebral infarction without residual deficits: Secondary | ICD-10-CM | POA: Insufficient documentation

## 2020-10-10 DIAGNOSIS — Z79899 Other long term (current) drug therapy: Secondary | ICD-10-CM | POA: Insufficient documentation

## 2020-10-10 DIAGNOSIS — I10 Essential (primary) hypertension: Secondary | ICD-10-CM | POA: Diagnosis not present

## 2020-10-10 DIAGNOSIS — R52 Pain, unspecified: Secondary | ICD-10-CM

## 2020-10-10 DIAGNOSIS — Z7982 Long term (current) use of aspirin: Secondary | ICD-10-CM | POA: Diagnosis not present

## 2020-10-10 DIAGNOSIS — M1711 Unilateral primary osteoarthritis, right knee: Secondary | ICD-10-CM | POA: Diagnosis not present

## 2020-10-10 DIAGNOSIS — Z471 Aftercare following joint replacement surgery: Secondary | ICD-10-CM | POA: Diagnosis not present

## 2020-10-10 DIAGNOSIS — E785 Hyperlipidemia, unspecified: Secondary | ICD-10-CM | POA: Diagnosis not present

## 2020-10-10 DIAGNOSIS — G8918 Other acute postprocedural pain: Secondary | ICD-10-CM | POA: Diagnosis not present

## 2020-10-10 HISTORY — DX: Presence of right artificial knee joint: Z96.651

## 2020-10-10 HISTORY — PX: TOTAL KNEE ARTHROPLASTY: SHX125

## 2020-10-10 SURGERY — ARTHROPLASTY, KNEE, TOTAL
Anesthesia: Regional | Site: Knee | Laterality: Right

## 2020-10-10 MED ORDER — BUPIVACAINE-MELOXICAM ER 400-12 MG/14ML IJ SOLN
INTRAMUSCULAR | Status: AC
  Filled 2020-10-10: qty 1

## 2020-10-10 MED ORDER — VANCOMYCIN HCL 1000 MG IV SOLR
INTRAVENOUS | Status: AC
  Filled 2020-10-10: qty 1000

## 2020-10-10 MED ORDER — DOCUSATE SODIUM 100 MG PO CAPS
100.0000 mg | ORAL_CAPSULE | Freq: Two times a day (BID) | ORAL | Status: DC
  Administered 2020-10-10 – 2020-10-11 (×3): 100 mg via ORAL
  Filled 2020-10-10 (×3): qty 1

## 2020-10-10 MED ORDER — MIDAZOLAM HCL 2 MG/2ML IJ SOLN
INTRAMUSCULAR | Status: AC
  Administered 2020-10-10: 2 mg via INTRAVENOUS
  Filled 2020-10-10: qty 2

## 2020-10-10 MED ORDER — VANCOMYCIN HCL 1000 MG IV SOLR
INTRAVENOUS | Status: DC | PRN
  Administered 2020-10-10: 1000 mg

## 2020-10-10 MED ORDER — MIDAZOLAM HCL 2 MG/2ML IJ SOLN: 2.0000 mg | Freq: Once | INTRAMUSCULAR | Status: AC

## 2020-10-10 MED ORDER — ACETAMINOPHEN 325 MG PO TABS
325.0000 mg | ORAL_TABLET | Freq: Four times a day (QID) | ORAL | Status: DC | PRN
Start: 2020-10-11 — End: 2020-10-11

## 2020-10-10 MED ORDER — ACETAMINOPHEN 500 MG PO TABS
1000.0000 mg | ORAL_TABLET | Freq: Four times a day (QID) | ORAL | Status: DC
  Administered 2020-10-10 – 2020-10-11 (×3): 1000 mg via ORAL
  Filled 2020-10-10 (×3): qty 2

## 2020-10-10 MED ORDER — LIDOCAINE 2% (20 MG/ML) 5 ML SYRINGE
INTRAMUSCULAR | Status: DC | PRN
  Administered 2020-10-10: 20 mg via INTRAVENOUS

## 2020-10-10 MED ORDER — POVIDONE-IODINE 10 % EX SWAB
2.0000 | Freq: Once | CUTANEOUS | Status: AC
Start: 2020-10-10 — End: 2020-10-10
  Administered 2020-10-10: 2 via TOPICAL

## 2020-10-10 MED ORDER — LACTATED RINGERS IV SOLN: INTRAVENOUS | Status: DC

## 2020-10-10 MED ORDER — MENTHOL 3 MG MT LOZG: 1.0000 | LOZENGE | OROMUCOSAL | Status: DC | PRN

## 2020-10-10 MED ORDER — ONDANSETRON HCL 4 MG/2ML IJ SOLN: 4.0000 mg | Freq: Four times a day (QID) | INTRAMUSCULAR | Status: DC | PRN

## 2020-10-10 MED ORDER — TRANEXAMIC ACID-NACL 1000-0.7 MG/100ML-% IV SOLN
1000.0000 mg | Freq: Once | INTRAVENOUS | Status: AC
  Administered 2020-10-10: 1000 mg via INTRAVENOUS
  Filled 2020-10-10: qty 100

## 2020-10-10 MED ORDER — PHENYLEPHRINE HCL-NACL 10-0.9 MG/250ML-% IV SOLN
INTRAVENOUS | Status: DC | PRN
  Administered 2020-10-10: 75 ug/min via INTRAVENOUS

## 2020-10-10 MED ORDER — ONDANSETRON HCL 4 MG/2ML IJ SOLN
INTRAMUSCULAR | Status: DC | PRN
  Administered 2020-10-10: 4 mg via INTRAVENOUS

## 2020-10-10 MED ORDER — KETOROLAC TROMETHAMINE 15 MG/ML IJ SOLN
INTRAMUSCULAR | Status: DC | PRN
  Administered 2020-10-10: 15 mg via INTRAVENOUS

## 2020-10-10 MED ORDER — FENTANYL CITRATE (PF) 100 MCG/2ML IJ SOLN: 50.0000 ug | Freq: Once | INTRAMUSCULAR | Status: AC

## 2020-10-10 MED ORDER — BUPIVACAINE IN DEXTROSE 0.75-8.25 % IT SOLN
INTRATHECAL | Status: DC | PRN
  Administered 2020-10-10: 1.4 mL via INTRATHECAL

## 2020-10-10 MED ORDER — AMLODIPINE BESYLATE 5 MG PO TABS
10.0000 mg | ORAL_TABLET | Freq: Every day | ORAL | Status: DC
  Administered 2020-10-11: 10 mg via ORAL
  Filled 2020-10-10: qty 2

## 2020-10-10 MED ORDER — PROPOFOL 10 MG/ML IV BOLUS
INTRAVENOUS | Status: DC | PRN
  Administered 2020-10-10 (×2): 20 mg via INTRAVENOUS

## 2020-10-10 MED ORDER — SODIUM CHLORIDE 0.9 % IR SOLN
Status: DC | PRN
  Administered 2020-10-10: 1000 mL

## 2020-10-10 MED ORDER — SODIUM CHLORIDE 0.9 % IV SOLN: INTRAVENOUS | Status: DC

## 2020-10-10 MED ORDER — FENTANYL CITRATE (PF) 100 MCG/2ML IJ SOLN: 25.0000 ug | INTRAMUSCULAR | Status: DC | PRN

## 2020-10-10 MED ORDER — DEXAMETHASONE SODIUM PHOSPHATE 10 MG/ML IJ SOLN
INTRAMUSCULAR | Status: DC | PRN
  Administered 2020-10-10: 10 mg
  Administered 2020-10-10: 5 mg

## 2020-10-10 MED ORDER — PROPOFOL 500 MG/50ML IV EMUL
INTRAVENOUS | Status: DC | PRN
  Administered 2020-10-10: 60 ug/kg/min via INTRAVENOUS

## 2020-10-10 MED ORDER — METOCLOPRAMIDE HCL 5 MG/ML IJ SOLN: 5.0000 mg | Freq: Three times a day (TID) | INTRAMUSCULAR | Status: DC | PRN

## 2020-10-10 MED ORDER — TRANEXAMIC ACID 1000 MG/10ML IV SOLN
INTRAVENOUS | Status: DC | PRN
  Administered 2020-10-10: 2000 mg via TOPICAL

## 2020-10-10 MED ORDER — ONDANSETRON HCL 4 MG PO TABS: 4.0000 mg | ORAL_TABLET | Freq: Four times a day (QID) | ORAL | Status: DC | PRN

## 2020-10-10 MED ORDER — OXYCODONE HCL 5 MG PO TABS
5.0000 mg | ORAL_TABLET | ORAL | Status: DC | PRN
Start: 2020-10-10 — End: 2020-10-11
  Administered 2020-10-11: 5 mg via ORAL
  Filled 2020-10-10: qty 1

## 2020-10-10 MED ORDER — CEFAZOLIN SODIUM-DEXTROSE 2-4 GM/100ML-% IV SOLN
2.0000 g | Freq: Four times a day (QID) | INTRAVENOUS | Status: AC
  Administered 2020-10-10 (×2): 2 g via INTRAVENOUS
  Filled 2020-10-10 (×2): qty 100

## 2020-10-10 MED ORDER — PHENYLEPHRINE 40 MCG/ML (10ML) SYRINGE FOR IV PUSH (FOR BLOOD PRESSURE SUPPORT)
PREFILLED_SYRINGE | INTRAVENOUS | Status: DC | PRN
  Administered 2020-10-10 (×3): 80 ug via INTRAVENOUS

## 2020-10-10 MED ORDER — 0.9 % SODIUM CHLORIDE (POUR BTL) OPTIME
TOPICAL | Status: DC | PRN
  Administered 2020-10-10: 1000 mL

## 2020-10-10 MED ORDER — PHENOL 1.4 % MT LIQD: 1.0000 | OROMUCOSAL | Status: DC | PRN

## 2020-10-10 MED ORDER — FENTANYL CITRATE (PF) 100 MCG/2ML IJ SOLN
INTRAMUSCULAR | Status: AC
  Administered 2020-10-10: 50 ug via INTRAVENOUS
  Filled 2020-10-10: qty 2

## 2020-10-10 MED ORDER — ORAL CARE MOUTH RINSE: 15.0000 mL | Freq: Once | OROMUCOSAL | Status: AC

## 2020-10-10 MED ORDER — CHLORHEXIDINE GLUCONATE 0.12 % MT SOLN: 15.0000 mL | Freq: Once | OROMUCOSAL | Status: AC

## 2020-10-10 MED ORDER — METHOCARBAMOL 1000 MG/10ML IJ SOLN
500.0000 mg | Freq: Four times a day (QID) | INTRAVENOUS | Status: DC | PRN
  Filled 2020-10-10: qty 5

## 2020-10-10 MED ORDER — CEFAZOLIN SODIUM-DEXTROSE 2-4 GM/100ML-% IV SOLN
2.0000 g | INTRAVENOUS | Status: AC
  Administered 2020-10-10: 2 g via INTRAVENOUS
  Filled 2020-10-10: qty 100

## 2020-10-10 MED ORDER — TRANEXAMIC ACID-NACL 1000-0.7 MG/100ML-% IV SOLN
1000.0000 mg | INTRAVENOUS | Status: AC
  Administered 2020-10-10: 1000 mg via INTRAVENOUS
  Filled 2020-10-10: qty 100

## 2020-10-10 MED ORDER — METHOCARBAMOL 500 MG PO TABS
500.0000 mg | ORAL_TABLET | Freq: Four times a day (QID) | ORAL | Status: DC | PRN
  Administered 2020-10-11: 500 mg via ORAL
  Filled 2020-10-10: qty 1

## 2020-10-10 MED ORDER — ASPIRIN 81 MG PO CHEW
81.0000 mg | CHEWABLE_TABLET | Freq: Two times a day (BID) | ORAL | Status: DC
  Administered 2020-10-10 – 2020-10-11 (×2): 81 mg via ORAL
  Filled 2020-10-10 (×2): qty 1

## 2020-10-10 MED ORDER — OXYCODONE HCL 5 MG PO TABS: 10.0000 mg | ORAL_TABLET | ORAL | Status: DC | PRN

## 2020-10-10 MED ORDER — IRRISEPT - 450ML BOTTLE WITH 0.05% CHG IN STERILE WATER, USP 99.95% OPTIME
TOPICAL | Status: DC | PRN
  Administered 2020-10-10: 450 mL via TOPICAL

## 2020-10-10 MED ORDER — CHLORHEXIDINE GLUCONATE 0.12 % MT SOLN
OROMUCOSAL | Status: AC
  Administered 2020-10-10: 15 mL via OROMUCOSAL
  Filled 2020-10-10: qty 15

## 2020-10-10 MED ORDER — METOCLOPRAMIDE HCL 5 MG PO TABS
5.0000 mg | ORAL_TABLET | Freq: Three times a day (TID) | ORAL | Status: DC | PRN
Start: 2020-10-10 — End: 2020-10-11

## 2020-10-10 MED ORDER — ACETAMINOPHEN 500 MG PO TABS
1000.0000 mg | ORAL_TABLET | Freq: Once | ORAL | Status: AC
  Administered 2020-10-10: 1000 mg via ORAL
  Filled 2020-10-10: qty 2

## 2020-10-10 MED ORDER — BUPIVACAINE-MELOXICAM ER 400-12 MG/14ML IJ SOLN
INTRAMUSCULAR | Status: DC | PRN
  Administered 2020-10-10: 400 mg

## 2020-10-10 MED ORDER — HYDROMORPHONE HCL 1 MG/ML IJ SOLN: 0.5000 mg | INTRAMUSCULAR | Status: DC | PRN

## 2020-10-10 SURGICAL SUPPLY — 85 items
ADH SKN CLS APL DERMABOND .7 (GAUZE/BANDAGES/DRESSINGS) ×1
ADH SKN CLS LQ APL DERMABOND (GAUZE/BANDAGES/DRESSINGS) ×1
ALCOHOL 70% 16 OZ (MISCELLANEOUS) ×2 IMPLANT
BAG COUNTER SPONGE SURGICOUNT (BAG) ×1 IMPLANT
BAG DECANTER FOR FLEXI CONT (MISCELLANEOUS) ×2 IMPLANT
BAG SPNG CNTER NS LX DISP (BAG) ×1
BANDAGE ESMARK 6X9 LF (GAUZE/BANDAGES/DRESSINGS) IMPLANT
BLADE SAG 18X100X1.27 (BLADE) ×2 IMPLANT
BNDG CMPR 9X6 STRL LF SNTH (GAUZE/BANDAGES/DRESSINGS) ×1
BNDG ESMARK 6X9 LF (GAUZE/BANDAGES/DRESSINGS) ×2
BOWL SMART MIX CTS (DISPOSABLE) ×2 IMPLANT
BSPLAT TIB 5D C 16NT STM RT (Knees) ×1 IMPLANT
CEMENT BONE R 1X40 (Cement) ×1 IMPLANT
CEMENT BONE REFOBACIN R1X40 US (Cement) ×1 IMPLANT
CLSR STERI-STRIP ANTIMIC 1/2X4 (GAUZE/BANDAGES/DRESSINGS) ×4 IMPLANT
COMP FEM CMT PS STD 4 RT (Joint) ×2 IMPLANT
COMPONENT FEM CMT PS STD 4 RT (Joint) IMPLANT
COOLER ICEMAN CLASSIC (MISCELLANEOUS) ×2 IMPLANT
COVER SURGICAL LIGHT HANDLE (MISCELLANEOUS) ×2 IMPLANT
CUFF TOURN SGL QUICK 34 (TOURNIQUET CUFF) ×2
CUFF TOURN SGL QUICK 42 (TOURNIQUET CUFF) ×1 IMPLANT
CUFF TRNQT CYL 34X4.125X (TOURNIQUET CUFF) ×1 IMPLANT
DERMABOND ADHESIVE PROPEN (GAUZE/BANDAGES/DRESSINGS) ×1
DERMABOND ADVANCED (GAUZE/BANDAGES/DRESSINGS) ×1
DERMABOND ADVANCED .7 DNX12 (GAUZE/BANDAGES/DRESSINGS) ×1 IMPLANT
DERMABOND ADVANCED .7 DNX6 (GAUZE/BANDAGES/DRESSINGS) IMPLANT
DRAPE EXTREMITY T 121X128X90 (DISPOSABLE) ×2 IMPLANT
DRAPE HALF SHEET 40X57 (DRAPES) ×2 IMPLANT
DRAPE INCISE IOBAN 66X45 STRL (DRAPES) ×1 IMPLANT
DRAPE ORTHO SPLIT 77X108 STRL (DRAPES) ×4
DRAPE POUCH INSTRU U-SHP 10X18 (DRAPES) ×2 IMPLANT
DRAPE SURG ORHT 6 SPLT 77X108 (DRAPES) ×2 IMPLANT
DRAPE U-SHAPE 47X51 STRL (DRAPES) ×4 IMPLANT
DRSG AQUACEL AG ADV 3.5X10 (GAUZE/BANDAGES/DRESSINGS) ×2 IMPLANT
DURAPREP 26ML APPLICATOR (WOUND CARE) ×6 IMPLANT
ELECT CAUTERY BLADE 6.4 (BLADE) ×2 IMPLANT
ELECT REM PT RETURN 9FT ADLT (ELECTROSURGICAL) ×2
ELECTRODE REM PT RTRN 9FT ADLT (ELECTROSURGICAL) ×1 IMPLANT
GLOVE SURG LTX SZ7 (GLOVE) ×6 IMPLANT
GLOVE SURG NEOP MICRO LF SZ7.5 (GLOVE) ×6 IMPLANT
GLOVE SURG SYN 7.5  E (GLOVE) ×8
GLOVE SURG SYN 7.5 E (GLOVE) ×4 IMPLANT
GLOVE SURG SYN 7.5 PF PI (GLOVE) ×4 IMPLANT
GLOVE SURG UNDER POLY LF SZ7 (GLOVE) ×2 IMPLANT
GOWN STRL REIN XL XLG (GOWN DISPOSABLE) ×2 IMPLANT
GOWN STRL REUS W/ TWL LRG LVL3 (GOWN DISPOSABLE) ×1 IMPLANT
GOWN STRL REUS W/TWL LRG LVL3 (GOWN DISPOSABLE) ×2
HANDPIECE INTERPULSE COAX TIP (DISPOSABLE) ×2
HDLS TROCR DRIL PIN KNEE 75 (PIN) ×2
HOOD PEEL AWAY FLYTE STAYCOOL (MISCELLANEOUS) ×4 IMPLANT
INSERT TIB AS PERS SZ CD 12 RT (Insert) ×1 IMPLANT
JET LAVAGE IRRISEPT WOUND (IRRIGATION / IRRIGATOR) ×2
KIT BASIN OR (CUSTOM PROCEDURE TRAY) ×2 IMPLANT
KIT TURNOVER KIT B (KITS) ×2 IMPLANT
LAVAGE JET IRRISEPT WOUND (IRRIGATION / IRRIGATOR) ×1 IMPLANT
MANIFOLD NEPTUNE II (INSTRUMENTS) ×2 IMPLANT
MARKER SKIN DUAL TIP RULER LAB (MISCELLANEOUS) ×2 IMPLANT
NDL SPNL 18GX3.5 QUINCKE PK (NEEDLE) ×2 IMPLANT
NEEDLE SPNL 18GX3.5 QUINCKE PK (NEEDLE) ×4 IMPLANT
NS IRRIG 1000ML POUR BTL (IV SOLUTION) ×2 IMPLANT
PACK TOTAL JOINT (CUSTOM PROCEDURE TRAY) ×2 IMPLANT
PAD ARMBOARD 7.5X6 YLW CONV (MISCELLANEOUS) ×4 IMPLANT
PAD COLD SHLDR WRAP-ON (PAD) ×2 IMPLANT
PIN DRILL HDLS TROCAR 75 4PK (PIN) IMPLANT
SAW OSC TIP CART 19.5X105X1.3 (SAW) ×2 IMPLANT
SCREW FEMALE HEX FIX 25X2.5 (ORTHOPEDIC DISPOSABLE SUPPLIES) ×1 IMPLANT
SET HNDPC FAN SPRY TIP SCT (DISPOSABLE) ×1 IMPLANT
STEM POLY PAT PLY 29M KNEE (Knees) ×1 IMPLANT
STEM TIBIA 5 DEG SZ C R KNEE (Knees) IMPLANT
SUCTION FRAZIER HANDLE 10FR (MISCELLANEOUS) ×2
SUCTION TUBE FRAZIER 10FR DISP (MISCELLANEOUS) ×1 IMPLANT
SUT ETHILON 2 0 FS 18 (SUTURE) ×2 IMPLANT
SUT MNCRL AB 4-0 PS2 18 (SUTURE) ×1 IMPLANT
SUT VIC AB 0 CT1 27 (SUTURE) ×4
SUT VIC AB 0 CT1 27XBRD ANBCTR (SUTURE) ×2 IMPLANT
SUT VIC AB 1 CTX 27 (SUTURE) ×6 IMPLANT
SUT VIC AB 2-0 CT1 27 (SUTURE) ×8
SUT VIC AB 2-0 CT1 TAPERPNT 27 (SUTURE) ×4 IMPLANT
SYR 50ML LL SCALE MARK (SYRINGE) ×4 IMPLANT
TIBIA STEM 5 DEG SZ C R KNEE (Knees) ×2 IMPLANT
TOWEL GREEN STERILE (TOWEL DISPOSABLE) ×2 IMPLANT
TOWEL GREEN STERILE FF (TOWEL DISPOSABLE) ×2 IMPLANT
TRAY CATH 16FR W/PLASTIC CATH (SET/KITS/TRAYS/PACK) ×1 IMPLANT
UNDERPAD 30X36 HEAVY ABSORB (UNDERPADS AND DIAPERS) ×2 IMPLANT
WRAP KNEE MAXI GEL POST OP (GAUZE/BANDAGES/DRESSINGS) ×2 IMPLANT

## 2020-10-10 NOTE — Anesthesia Procedure Notes (Signed)
Anesthesia Regional Block: Adductor canal block   Pre-Anesthetic Checklist: , timeout performed,  Correct Patient, Correct Site, Correct Laterality,  Correct Procedure, Correct Position, site marked,  Risks and benefits discussed,  Surgical consent,  Pre-op evaluation,  At surgeon's request and post-op pain management  Laterality: Right  Prep: Maximum Sterile Barrier Precautions used, chloraprep       Needles:  Injection technique: Single-shot  Needle Type: Echogenic Stimulator Needle     Needle Length: 9cm  Needle Gauge: 22     Additional Needles:   Procedures:,,,, ultrasound used (permanent image in chart),,    Narrative:  Start time: 10/10/2020 10:20 AM End time: 10/10/2020 10:28 AM Injection made incrementally with aspirations every 5 mL.  Performed by: Personally  Anesthesiologist: Freddrick March, MD  Additional Notes: Monitors applied. No increased pain on injection. No increased resistance to injection. Injection made in 5cc increments. Good needle visualization. Patient tolerated procedure well.

## 2020-10-10 NOTE — Discharge Instructions (Signed)

## 2020-10-10 NOTE — Evaluation (Signed)
Physical Therapy Evaluation Patient Details Name: Michelle Carrillo MRN: [id number] DOB: 1950/05/01 Today's Date: 10/10/2020   History of Present Illness  Pt is 70 yo female s/p R TKA On 10/10/20. She has medical history including arthritis, ICH with craniotomy in 2021, and HTN  Clinical Impression  Pt is s/p TKA resulting in the deficits listed below (see PT Problem List). Pt is normally independent and resides with family who can provide 24 hr support initially.  Pt seen s/p TKA on DOS.  She did very well for DOS with excellent pain control, quad activation, and ROM.  Pt ambulated 25' with RW and min guard.  Pt with excellent rehab potential.  Pt will benefit from skilled PT to increase their independence and safety with mobility to allow discharge to the venue listed below.      Follow Up Recommendations Follow surgeon's recommendation for DC plan and follow-up therapies;Supervision/Assistance - 24 hour    Equipment Recommendations  3in1 (PT)    Recommendations for Other Services       Precautions / Restrictions Precautions Precautions: Fall Restrictions Weight Bearing Restrictions: Yes RLE Weight Bearing: Weight bearing as tolerated      Mobility  Bed Mobility Overal bed mobility: Needs Assistance Bed Mobility: Supine to Sit;Sit to Supine     Supine to sit: Supervision Sit to supine: Supervision        Transfers Overall transfer level: Needs assistance Equipment used: Rolling walker (2 wheeled) Transfers: Sit to/from Stand Sit to Stand: Min guard         General transfer comment: Cues for hand placement and R LE management  Ambulation/Gait Ambulation/Gait assistance: Min guard Gait Distance (Feet): 80 Feet Assistive device: Rolling walker (2 wheeled) Gait Pattern/deviations: Step-through pattern;Decreased stride length Gait velocity: decreased   General Gait Details: Pt started with step to R pattern but progressed to step through pattern; limited pain;  tolerated well ;cued for RW proximity  Stairs            Wheelchair Mobility    Modified Rankin (Stroke Patients Only)       Balance Overall balance assessment: Needs assistance Sitting-balance support: No upper extremity supported Sitting balance-Leahy Scale: Normal     Standing balance support: No upper extremity supported;Bilateral upper extremity supported Standing balance-Leahy Scale: Fair Standing balance comment: RW to ambulate but could static stand without support                             Pertinent Vitals/Pain Pain Assessment: No/denies pain    Home Living Family/patient expects to be discharged to:: Private residence Living Arrangements: Spouse/significant other Available Help at Discharge: Family;Available 24 hours/day Type of Home: House Home Access: Stairs to enter Entrance Stairs-Rails: None Entrance Stairs-Number of Steps: 2 Home Layout: One level Home Equipment: Environmental consultant - 2 wheels      Prior Function Level of Independence: Independent   Gait / Transfers Assistance Needed: Able to ambulate in community  ADL's / Homemaking Assistance Needed: Able to perform ADLs, IADLs, shopping, but does not drive        Hand Dominance        Extremity/Trunk Assessment   Upper Extremity Assessment Upper Extremity Assessment: Overall WFL for tasks assessed    Lower Extremity Assessment Lower Extremity Assessment: LLE deficits/detail;RLE deficits/detail RLE Deficits / Details: Expected post op changes. ROM: hip and ankle WFL, knee 0 to 90 degree; MMT : 3/5  not further tested RLE Sensation:  WNL LLE Deficits / Details: ROM WNL; MMT 5/5 LLE Coordination: WNL    Cervical / Trunk Assessment Cervical / Trunk Assessment: Normal  Communication   Communication: Prefers language other than [place] (daughter interpreted)  Cognition Arousal/Alertness: Awake/alert Behavior During Therapy: WFL for tasks assessed/performed Overall Cognitive  Status: Difficult to assess                                 General Comments: Oriented, answered question appropriately, followed commands.  Did at times become distracted/focused on tangential topic (focused on catheter; asking about ankle injuries - if they do total ankle replacements)      General Comments General comments (skin integrity, edema, etc.): Educated on safe ice use, no pivots, resting with leg straight, and ankle pumps to prevent blood clots    Exercises     Assessment/Plan    PT Assessment Patient needs continued PT services  PT Problem List Decreased strength;Decreased safety awareness;Decreased mobility;Decreased range of motion;Decreased knowledge of precautions;Decreased activity tolerance;Decreased balance;Decreased knowledge of use of DME       PT Treatment Interventions DME instruction;Therapeutic activities;Modalities;Gait training;Therapeutic exercise;Patient/family education;Stair training;Balance training;Functional mobility training    PT Goals (Current goals can be found in the Care Plan section)  Acute Rehab PT Goals Patient Stated Goal: return home PT Goal Formulation: With patient/family Time For Goal Achievement: 10/24/20 Potential to Achieve Goals: Good    Frequency 7X/week   Barriers to discharge        Co-evaluation               AM-PAC PT "6 Clicks" Mobility  Outcome Measure Help needed turning from your back to your side while in a flat bed without using bedrails?: None Help needed moving from lying on your back to sitting on the side of a flat bed without using bedrails?: A Little Help needed moving to and from a bed to a chair (including a wheelchair)?: A Little Help needed standing up from a chair using your arms (e.g., wheelchair or bedside chair)?: A Little Help needed to walk in hospital room?: A Little Help needed climbing 3-5 steps with a railing? : A Little 6 Click Score: 19    End of Session Equipment  Utilized During Treatment: Gait belt Activity Tolerance: Patient tolerated treatment well Patient left: in bed;with call bell/phone within reach;with family/visitor present;with SCD's reapplied Nurse Communication: Mobility status PT Visit Diagnosis: Other abnormalities of gait and mobility (R26.89);Muscle weakness (generalized) (M62.81)    Time: 3570-1779 PT Time Calculation (min) (ACUTE ONLY): 27 min   Charges:   PT Evaluation $PT Eval Low Complexity: 1 Low PT Treatments $Gait Training: 8-22 mins        Abran Richard, PT Acute Rehab Services Pager [phone] [name] 10/10/2020, 5:51 PM

## 2020-10-10 NOTE — Op Note (Signed)
Total Knee Arthroplasty Procedure Note  Preoperative diagnosis: Right knee osteoarthritis  Postoperative diagnosis:same  Operative procedure: Right total knee arthroplasty. CPT [phone]  Surgeon: N. Eduard Roux, MD  Assist: Madalyn Rob, PA-C; necessary for the timely completion of procedure and due to complexity of procedure.  Anesthesia: Spinal, regional, local  Tourniquet time: see anesthesia record  Implants used: Zimmer persona Femur: CR 4 Tibia: C Patella: 29 mm Polyethylene: 12 mm, MC  Indication: Michelle Carrillo is a 70 y.o. year old female with a history of knee pain. Having failed conservative management, the patient elected to proceed with a total knee arthroplasty.  We have reviewed the risk and benefits of the surgery and they elected to proceed after voicing understanding.  Procedure:  After informed consent was obtained and understanding of the risk were voiced including but not limited to bleeding, infection, damage to surrounding structures including nerves and vessels, blood clots, leg length inequality and the failure to achieve desired results, the operative extremity was marked with verbal confirmation of the patient in the holding area.   The patient was then brought to the operating room and transported to the operating room table in the supine position.  A tourniquet was applied to the operative extremity around the upper thigh. The operative limb was then prepped and draped in the usual sterile fashion and preoperative antibiotics were administered.  A time out was performed prior to the start of surgery confirming the correct extremity, preoperative antibiotic administration, as well as team members, implants and instruments available for the case. Correct surgical site was also confirmed with preoperative radiographs. The limb was then elevated for exsanguination and the tourniquet was inflated. A midline incision was made and a standard medial  parapatellar approach was performed.  The infrapatellar fat pad was removed.  Suprapatellar synovium was removed to reveal the anterior distal femoral cortex.  A medial peel was performed to release the capsule of the medial tibial plateau.  The patella was then everted and was prepared and sized to a 29 mm.  A cover was placed on the patella for protection from retractors.  The knee was then brought into full flexion and we then turned our attention to the femur.  The cruciates were sacrificed.  Start site was drilled in the femur and the intramedullary distal femoral cutting guide was placed, set at 5 degrees valgus, taking 10 mm of distal resection. The distal cut was made. Osteophytes were then removed.  Next, the proximal tibial cutting guide was placed with appropriate slope, varus/valgus alignment and depth of resection. The proximal tibial cut was made. Gap blocks were then used to assess the extension gap and alignment, and appropriate soft tissue releases were performed. Attention was turned back to the femur, which was sized using the sizing guide to a size 4. Appropriate rotation of the femoral component was determined using epicondylar axis, Whiteside's line, and assessing the flexion gap under ligament tension. The appropriate size 4-in-1 cutting block was placed and checked with an angel wing and cuts were made. Posterior femoral osteophytes and uncapped bone were then removed with the curved osteotome.  Trial components were placed, and stability was checked in full extension, mid-flexion, and deep flexion. Proper tibial rotation was determined and marked.  The patella tracked well without a lateral release.  The femoral lugs were then drilled. Trial components were then removed and tibial preparation performed.  The tibia was sized for a size C component.   The bony  surfaces were irrigated with a pulse lavage and then dried. Bone cement was vacuum mixed on the back table, and the final components  sized above were cemented into place.  Antibiotic irrigation was placed in the knee joint and soft tissues while the cement cured.  After cement had finished curing, excess cement was removed. The stability of the construct was re-evaluated throughout a range of motion and found to be acceptable. The trial liner was removed, the knee was copiously irrigated, and the knee was re-evaluated for any excess bone debris. The real polyethylene liner, 12 mm thick, was inserted and checked to ensure the locking mechanism had engaged appropriately. The tourniquet was deflated and hemostasis was achieved. The wound was irrigated with normal saline.  One gram of vancomycin powder was placed in the surgical bed.  Capsular closure was performed with a #1 vicryl, subcutaneous fat closed with a 0 vicryl suture, then subcutaneous tissue closed with interrupted 2.0 vicryl suture. The skin was then closed with a 2.0 nylon and dermabond. A sterile dressing was applied.  The patient was awakened in the operating room and taken to recovery in stable condition. All sponge, needle, and instrument counts were correct at the end of the case.  Michelle Carrillo was necessary for opening, closing, retracting, limb positioning and overall facilitation and completion of the surgery.  Position: supine  Complications: none.  Time Out: performed   Drains/Packing: none  Estimated blood loss: minimal  Returned to Recovery Room: in good condition.   Antibiotics: yes   Mechanical VTE (DVT) Prophylaxis: sequential compression devices, TED thigh-high  Chemical VTE (DVT) Prophylaxis: aspirin  Fluid Replacement  Crystalloid: see anesthesia record Blood: none  FFP: none   Specimens Removed: 1 to pathology   Sponge and Instrument Count Correct? yes   PACU: portable radiograph - knee AP and Lateral   Plan/RTC: Return in 2 weeks for wound check.   Weight Bearing/Load Lower Extremity: full   Implant Name Type Inv. Item Serial  No. Manufacturer Lot No. LRB No. Used Action  CEMENT BONE REFOBACIN [id number] [place] - [id number] Cement CEMENT BONE REFOBACIN R1X40 [place]  ZIMMER RECON(ORTH,TRAU,BIO,SG) [id number] Right 1 Implanted  CEMENT BONE R 1X40 - [id number] Cement CEMENT BONE R 1X40  ZIMMER RECON(ORTH,TRAU,BIO,SG) [id number] Right 1 Implanted  STEM POLY PAT PLY 77M KNEE - [id number] Knees STEM POLY PAT PLY 77M KNEE  ZIMMER RECON(ORTH,TRAU,BIO,SG) [id number] Right 1 Implanted  PERSONAFEMUR CEMENTED CRUCIATE RETAINING STANDARD    ZIMMER KNEE [id number] Right 1 Implanted  TIBIA STEM 5 DEG SZ C R KNEE - [id number] Knees TIBIA STEM 5 DEG SZ C R KNEE  ZIMMER RECON(ORTH,TRAU,BIO,SG) [id number] Right 1 Implanted  INSERT TIB AS PERS SZ CD 12 RT - [id number] Insert INSERT TIB AS PERS SZ CD 12 RT  ZIMMER RECON(ORTH,TRAU,BIO,SG) [id number] Right 1 Implanted    N. Eduard Roux, MD [facility] 12:44 PM

## 2020-10-10 NOTE — Transfer of Care (Signed)
Immediate Anesthesia Transfer of Care Note  Patient: Michelle Carrillo  Procedure(s) Performed: RIGHT TOTAL KNEE ARTHROPLASTY (Right: Knee)  Patient Location: PACU  Anesthesia Type:General  Level of Consciousness: awake, alert  and oriented  Airway & Oxygen Therapy: Patient Spontanous Breathing  Post-op Assessment: Report given to RN and Post -op Vital signs reviewed and stable  Post vital signs: Reviewed and stable  Last Vitals:  Vitals Value Taken Time  BP 100/52 10/10/20 1327  Temp 36.2 C 10/10/20 1325  Pulse 76 10/10/20 1336  Resp 15 10/10/20 1336  SpO2 99 % 10/10/20 1336  Vitals shown include unvalidated device data.  Last Pain:  Vitals:   10/10/20 1325  TempSrc:   PainSc: 0-No pain         Complications: No notable events documented.

## 2020-10-10 NOTE — Anesthesia Procedure Notes (Signed)
Spinal  Patient location during procedure: OR Start time: 10/10/2020 11:20 AM End time: 10/10/2020 11:23 AM Reason for block: surgical anesthesia Staffing Performed: anesthesiologist  Anesthesiologist: Freddrick March, MD Preanesthetic Checklist Completed: patient identified, IV checked, risks and benefits discussed, surgical consent, monitors and equipment checked, pre-op evaluation and timeout performed Spinal Block Patient position: sitting Prep: DuraPrep and site prepped and draped Patient monitoring: cardiac monitor, continuous pulse ox and blood pressure Approach: midline Location: L3-4 Injection technique: single-shot Needle Needle type: Pencan  Needle gauge: 24 G Needle length: 9 cm Assessment Sensory level: T6 Events: CSF return Additional Notes Functioning IV was confirmed and monitors were applied. Sterile prep and drape, including hand hygiene and sterile gloves were used. The patient was positioned and the spine was prepped. The skin was anesthetized with lidocaine.  Free flow of clear CSF was obtained prior to injecting local anesthetic into the CSF.  The spinal needle aspirated freely following injection.  The needle was carefully withdrawn.  The patient tolerated the procedure well.

## 2020-10-10 NOTE — Anesthesia Postprocedure Evaluation (Signed)
Anesthesia Post Note  Patient: Michelle Carrillo  Procedure(s) Performed: RIGHT TOTAL KNEE ARTHROPLASTY (Right: Knee)     Patient location during evaluation: PACU Anesthesia Type: Regional, MAC and Spinal Level of consciousness: awake and alert and oriented Pain management: pain level controlled Vital Signs Assessment: post-procedure vital signs reviewed and stable Respiratory status: spontaneous breathing, nonlabored ventilation and respiratory function stable Cardiovascular status: blood pressure returned to baseline and stable Postop Assessment: no headache, no backache, spinal receding and no apparent nausea or vomiting Anesthetic complications: no   No notable events documented.  Last Vitals:  Vitals:   10/10/20 1355 10/10/20 1421  BP: (!) 105/58 (!) 115/56  Pulse: 78 69  Resp: 13 16  Temp: (!) 36.2 C 36.5 C  SpO2: 97%     Last Pain:  Vitals:   10/10/20 1421  TempSrc: Oral  PainSc:         RLE Motor Response: Purposeful movement (10/10/20 1458) RLE Sensation: Decreased;Numbness (10/10/20 1458) L Sensory Level: S1-Sole of foot, small toes (10/10/20 1458) R Sensory Level: S1-Sole of foot, small toes (10/10/20 1458)  [name]

## 2020-10-10 NOTE — H&P (Signed)
PREOPERATIVE H&P  Chief Complaint: right knee degenerative joing disease  HPI: Michelle Carrillo is a 70 y.o. female who presents for surgical treatment of right knee degenerative joing disease.  She denies any changes in medical history.  Past Medical History:  Diagnosis Date   Anxiety    on meds   Arthritis    bilateral knees   Cataract    RIGHT eye   Depression    on meds   Herpes zoster kerstitis s/p corneal transplant 2015    Hypertension    on meds   Right knee osteoarthritis    Strabismus    due to eye injury as a child   Stroke ([place]) 07/2019   stroke with craniotomy   Past Surgical History:  Procedure Laterality Date   CESAREAN SECTION      x 2    CHOLECYSTECTOMY     COLONOSCOPY  04/22/2020   CORNEAL TRANSPLANT Left    2006 and 2016   CRANIOTOMY Left 08/09/2019   Procedure: CRANIOTOMY HEMATOMA EVACUATION SUBDURAL;  Surgeon: Ashok Pall, MD;  Location: [place];  Service: Neurosurgery;  Laterality: Left;  left   EYE SURGERY     TUBAL LIGATION     Social History   Socioeconomic History   Marital status: Married    Spouse name: Not on file   Number of children: Not on file   Years of education: Not on file   Highest education level: Not on file  Occupational History   Not on file  Tobacco Use   Smoking status: Never   Smokeless tobacco: Never  Vaping Use   Vaping Use: Never used  Substance and Sexual Activity   Alcohol use: Never   Drug use: Never   Sexual activity: Not on file  Other Topics Concern   Not on file  Social History Narrative   Not on file   Social Determinants of Health   Financial Resource Strain: Not on file  Food Insecurity: Not on file  Transportation Needs: Not on file  Physical Activity: Not on file  Stress: Not on file  Social Connections: Not on file   Family History  Problem Relation Age of Onset   Diabetes Mother    Ovarian cancer Mother    High blood pressure Sister    Diabetes Sister    Diabetes  Brother    Stomach cancer Maternal Grandmother 36   Colon polyps Neg Hx    Colon cancer Neg Hx    Rectal cancer Neg Hx    Esophageal cancer Neg Hx    No Known Allergies Prior to Admission medications   Medication Sig Start Date End Date Taking? Authorizing Provider  amLODipine (NORVASC) 10 MG tablet Take 1 tablet by mouth once daily Patient taking differently: Take 10 mg by mouth daily. 09/16/20  Yes Sanjuan Dame, MD  aspirin EC 81 MG tablet Take 1 tablet (81 mg total) by mouth 2 (two) times daily. To be taken after surgery 10/07/20  Yes Aundra Dubin, PA-C  atorvastatin (LIPITOR) 40 MG tablet Take 1 tablet (40 mg total) by mouth daily. 07/21/20  Yes McCue, [name], NP  lisinopril (ZESTRIL) 20 MG tablet Take 1 tablet (20 mg total) by mouth daily. 10/29/19  Yes Mosetta Anis, MD  PARoxetine (PAXIL) 20 MG tablet Take 1 tablet (20 mg total) by mouth daily. Patient taking differently: Take 20 mg by mouth daily as needed (anxiety/depression). 10/29/19 10/28/20 Yes Mosetta Anis, MD  Polyethyl Glycol-Propyl  Glycol (SYSTANE ULTRA) 0.4-0.3 % SOLN Apply 1-2 drops to eye daily as needed. Patient taking differently: Apply 1-2 drops to eye daily as needed (dry eyes). 01/09/20  Yes Sanjuan Dame, MD  docusate sodium (COLACE) 100 MG capsule Take 1 capsule (100 mg total) by mouth daily as needed. 10/07/20 10/07/21  Aundra Dubin, PA-C  methocarbamol (ROBAXIN) 500 MG tablet Take 1 tablet (500 mg total) by mouth 2 (two) times daily as needed. To be taken after surgery 10/07/20   Aundra Dubin, PA-C  Multiple Vitamin (MULTIVITAMIN WITH MINERALS) TABS tablet Take 1 tablet by mouth daily. 08/24/19   Donzetta Starch, NP  ondansetron (ZOFRAN) 4 MG tablet Take 1 tablet (4 mg total) by mouth every 8 (eight) hours as needed for nausea or vomiting. 10/07/20   Aundra Dubin, PA-C  oxyCODONE-acetaminophen (PERCOCET) 5-325 MG tablet Take 1-2 tablets by mouth every 6 (six) hours as needed. To be taken after surgery  10/07/20   Aundra Dubin, PA-C  prednisoLONE acetate (PRED FORTE) 1 % ophthalmic suspension Place 1 drop into both eyes in the morning and at bedtime. Patient not taking: Reported on 10/01/2020 01/09/20   Sanjuan Dame, MD     Positive ROS: All other systems have been reviewed and were otherwise negative with the exception of those mentioned in the HPI and as above.  Physical Exam: General: Alert, no acute distress Cardiovascular: No pedal edema Respiratory: No cyanosis, no use of accessory musculature GI: abdomen soft Skin: No lesions in the area of chief complaint Neurologic: Sensation intact distally Psychiatric: Patient is competent for consent with normal mood and affect Lymphatic: no lymphedema  MUSCULOSKELETAL: exam stable  Assessment: right knee degenerative joing disease  Plan: Plan for Procedure(s): RIGHT TOTAL KNEE ARTHROPLASTY  The risks benefits and alternatives were discussed with the patient including but not limited to the risks of nonoperative treatment, versus surgical intervention including infection, bleeding, nerve injury,  blood clots, cardiopulmonary complications, morbidity, mortality, among others, and they were willing to proceed.   Preoperative templating of the joint replacement has been completed, documented, and submitted to the Operating Room personnel in order to optimize intra-operative equipment management.   Eduard Roux, MD 10/10/2020 10:03 AM

## 2020-10-10 NOTE — Care Plan (Signed)
Case manager spoke with patient's daughter, who assists with interpretation for patient prior to surgery on 10/09/20. She is an [facility] patient through [facility] and is agreeable to case management. She will have family to assist her after discharge home. She had CPM delivered to her home by [employer] prior to surgery. She reports she has a FWW already. Unknown if a 3in1/BSC was delivered as well. "Daughter thinks so". Anticipate HHPT will be needed after a short hospital stay. Referral made to [facility] after choice provided. Reviewed all post op care instructions with daughter. OPPT appointment scheduled for [facility] on 10/27/20. Will follow for needs.

## 2020-10-11 DIAGNOSIS — Z8673 Personal history of transient ischemic attack (TIA), and cerebral infarction without residual deficits: Secondary | ICD-10-CM | POA: Diagnosis not present

## 2020-10-11 DIAGNOSIS — Z79899 Other long term (current) drug therapy: Secondary | ICD-10-CM | POA: Diagnosis not present

## 2020-10-11 DIAGNOSIS — Z7982 Long term (current) use of aspirin: Secondary | ICD-10-CM | POA: Diagnosis not present

## 2020-10-11 DIAGNOSIS — M1711 Unilateral primary osteoarthritis, right knee: Secondary | ICD-10-CM | POA: Diagnosis not present

## 2020-10-11 DIAGNOSIS — I10 Essential (primary) hypertension: Secondary | ICD-10-CM | POA: Diagnosis not present

## 2020-10-11 NOTE — Progress Notes (Signed)
Patient ID: Michelle Carrillo, female   DOB: 07-22-1950, 70 y.o.   MRN: [id number] The patient is doing well this morning.  She is awake and alert.  Her daughter who interprets for her is at the bedside.  Her right operative knee is stable.  She is neurovascularly intact.  Calf is soft.  She can be discharged to home today.

## 2020-10-11 NOTE — Evaluation (Signed)
Occupational Therapy Evaluation Patient Details Name: Michelle Carrillo MRN: [id number] DOB: 12/21/1950 Today's Date: 10/11/2020    History of Present Illness Pt is 70 yo female s/p R TKA On 10/10/20. She has medical history including arthritis, ICH with craniotomy in 2021, and HTN   Clinical Impression   Patient admitted for the surgery above.  Patient is doing very well with minimal complaints of pain.  She was able to dress herself with Min A, and is walking ad lib in her room with RW.  It sounds as if [facility] PT/OT was ordered.  No further acute OT needs.      Follow Up Recommendations  Home health OT    Equipment Recommendations  Other (comment) (long handled sponge)    Recommendations for Other Services       Precautions / Restrictions Precautions Precautions: Fall Restrictions Weight Bearing Restrictions: No RLE Weight Bearing: Weight bearing as tolerated      Mobility Bed Mobility Overal bed mobility: Modified Independent               Patient Response: Cooperative  Transfers Overall transfer level: Modified independent Equipment used: Rolling walker (2 wheeled)                  Balance Overall balance assessment: Mild deficits observed, not formally tested                                         ADL either performed or assessed with clinical judgement   ADL Overall ADL's : Modified independent                                       General ADL Comments: Patient needing Min A for shoes, and will need assist with TED stockings, but otherwise close to her baseline for ADL.  Mod I with in room mobility/toileting     Vision Baseline Vision/History: Wears glasses Wears Glasses: At all times Patient Visual Report: No change from baseline       Perception     Praxis      Pertinent Vitals/Pain Pain Assessment: Faces Faces Pain Scale: Hurts a little bit Pain Location: surgical knee Pain Descriptors /  Indicators: Aching Pain Intervention(s): Monitored during session     Hand Dominance Right   Extremity/Trunk Assessment Upper Extremity Assessment Upper Extremity Assessment: Overall WFL for tasks assessed   Lower Extremity Assessment Lower Extremity Assessment: Defer to PT evaluation   Cervical / Trunk Assessment Cervical / Trunk Assessment: Normal   Communication Communication Communication: Prefers language other than English   Cognition Arousal/Alertness: Awake/alert Behavior During Therapy: WFL for tasks assessed/performed Overall Cognitive Status: Within Functional Limits for tasks assessed                                                      Home Living Family/patient expects to be discharged to:: Private residence Living Arrangements: Spouse/significant other Available Help at Discharge: Family;Available 24 hours/day Type of Home: House Home Access: Stairs to enter [employer] of Steps: 2 Entrance Stairs-Rails: None Home Layout: One level     Bathroom Shower/Tub: Tub/shower unit   [employer]  Toilet: Standard     Home Equipment: Walker - 2 wheels;Shower seat          Prior Functioning/Environment Level of Independence: Independent  Gait / Transfers Assistance Needed: Able to ambulate in community ADL's / Homemaking Assistance Needed: Able to perform ADLs, IADLs, shopping, but does not drive            OT Problem List: Pain;Decreased range of motion      OT Treatment/Interventions:      OT Goals(Current goals can be found in the care plan section) Acute Rehab OT Goals Patient Stated Goal: return home OT Goal Formulation: With patient Time For Goal Achievement: 10/11/20 Potential to Achieve Goals: Good  OT Frequency:     Barriers to D/C:   none         Co-evaluation              AM-PAC OT "6 Clicks" Daily Activity     Outcome Measure Help from another person eating meals?: None Help from another  person taking care of personal grooming?: None Help from another person toileting, which includes using toliet, bedpan, or urinal?: None Help from another person bathing (including washing, rinsing, drying)?: A Little Help from another person to put on and taking off regular upper body clothing?: None Help from another person to put on and taking off regular lower body clothing?: A Little 6 Click Score: 22   End of Session Equipment Utilized During Treatment: Rolling walker Nurse Communication: Mobility status  Activity Tolerance: Patient tolerated treatment well Patient left: in bed  OT Visit Diagnosis: Pain Pain - Right/Left: Left Pain - part of body: Knee                Time: 0811-0828 OT Time Calculation (min): 17 min Charges:  OT General Charges $OT Visit: 1 Visit OT Evaluation $OT Eval Moderate Complexity: 1 Mod  10/11/2020  Rich, OTR/L  Acute Rehabilitation Services  Office:  [phone]   Metta Clines 10/11/2020, 8:33 AM

## 2020-10-11 NOTE — Progress Notes (Signed)
Patient was transported via wheelchair by NT for discharge home; in no acute distress nor complaints of pain nor discomfort; all belongings checked and accounted for and was brought along by daughter; discharge instructions given to patient's daughter by RN and she verbalized on the instructions given.

## 2020-10-11 NOTE — Progress Notes (Signed)
Physical Therapy Treatment Patient Details Name: Michelle Carrillo MRN: [id number] DOB: Aug 31, 1950 Today's Date: 10/11/2020    History of Present Illness Pt is 70 yo female s/p R TKA On 10/10/20. She has medical history including arthritis, ICH with craniotomy in 2021, and HTN    PT Comments    Pt sitting up on EoB after working with OT agreeable to working with PT prior to discharge. Pt is making good progress towards her goals. Pt's daughter present, providing interpretation and encouragement for her mother. Pt is limited in safe mobility in safe mobility by decreased R knee ROM and surgical pain. Pt si currently  supervision for transfers and ambulation and minAx2 for stair ascent/descent with 2 person handhold assist. Pt able to perform HEP with guidance from her daughter. All questions answered and pt and daughter hopeful for expedient discharge home.     Follow Up Recommendations  Follow surgeon's recommendation for DC plan and follow-up therapies;Supervision/Assistance - 24 hour     Equipment Recommendations  3in1 (PT)    Recommendations for Other Services       Precautions / Restrictions Precautions Precautions: Fall Restrictions Weight Bearing Restrictions: No RLE Weight Bearing: Weight bearing as tolerated    Mobility  Bed Mobility Overal bed mobility: Modified Independent             General bed mobility comments: sitting dressed EoB on entry    Transfers Overall transfer level: Needs assistance Equipment used: Rolling walker (2 wheeled) Transfers: Sit to/from Stand Sit to Stand: Supervision         General transfer comment: Cues for hand placement and R LE management  Ambulation/Gait Ambulation/Gait assistance: Supervision Gait Distance (Feet): 150 Feet Assistive device: Rolling walker (2 wheeled) Gait Pattern/deviations: Step-through pattern;Decreased stride length Gait velocity: decreased Gait velocity interpretation: 1.31 - 2.62 ft/sec,  indicative of limited community ambulator General Gait Details: mildly antalgic gait with step through pattern   Stairs Stairs: Yes Stairs assistance: Min assist;+2 physical assistance Stair Management: One rail Left;No rails;Forwards Number of Stairs: 4 General stair comments: initally practiced sequencing with rail and multimodal cuing, and minAx2 for physical assist to steady without rails       Balance Overall balance assessment: Needs assistance Sitting-balance support: No upper extremity supported Sitting balance-Leahy Scale: Normal     Standing balance support: No upper extremity supported;Bilateral upper extremity supported Standing balance-Leahy Scale: Fair Standing balance comment: RW to ambulate but could static stand without support                            Cognition Arousal/Alertness: Awake/alert Behavior During Therapy: WFL for tasks assessed/performed Overall Cognitive Status: Difficult to assess                                 General Comments: Oriented, answered question appropriately, followed commands.  Did at times become distracted/focused on tangential topic (focused on catheter; asking about ankle injuries - if they do total ankle replacements)      Exercises Total Joint Exercises Ankle Circles/Pumps: AROM;Both;10 reps Quad Sets: Right;AROM;10 reps;Seated Heel Slides: AROM;Right;10 reps;Seated Hip ABduction/ADduction: AROM;Right;10 reps;Seated Straight Leg Raises: AROM;Right;10 reps;Seated Long Arc Quad: AROM;Both;10 reps;Seated Knee Flexion: AROM;Right;10 reps;Seated;AAROM;5 reps Goniometric ROM: 6-89    General Comments General comments (skin integrity, edema, etc.): Educated on exercises, need for movement to reduce pain and to prevent blood clots.  Pertinent Vitals/Pain Pain Assessment: 0-10 Pain Score: 5  Faces Pain Scale: Hurts a little bit Pain Location: surgical knee with exercise and walking, 3/10 at  rest Pain Descriptors / Indicators: Aching;Sore;Discomfort Pain Intervention(s): Monitored during session    Home Living Family/patient expects to be discharged to:: Private residence Living Arrangements: Spouse/significant other Available Help at Discharge: Family;Available 24 hours/day Type of Home: House Home Access: Stairs to enter Entrance Stairs-Rails: None Home Layout: One level Home Equipment: Environmental consultant - 2 wheels;Shower seat      Prior Function Level of Independence: Independent  Gait / Transfers Assistance Needed: Able to ambulate in community ADL's / Homemaking Assistance Needed: Able to perform ADLs, IADLs, shopping, but does not drive     PT Goals (current goals can now be found in the care plan section) Acute Rehab PT Goals Patient Stated Goal: return home PT Goal Formulation: With patient/family Time For Goal Achievement: 10/24/20 Potential to Achieve Goals: Good Progress towards PT goals: Progressing toward goals    Frequency    7X/week      PT Plan Current plan remains appropriate       AM-PAC PT "6 Clicks" Mobility   Outcome Measure  Help needed turning from your back to your side while in a flat bed without using bedrails?: None Help needed moving from lying on your back to sitting on the side of a flat bed without using bedrails?: A Little Help needed moving to and from a bed to a chair (including a wheelchair)?: A Little Help needed standing up from a chair using your arms (e.g., wheelchair or bedside chair)?: A Little Help needed to walk in hospital room?: A Little Help needed climbing 3-5 steps with a railing? : A Little 6 Click Score: 19    End of Session Equipment Utilized During Treatment: Gait belt Activity Tolerance: Patient tolerated treatment well Patient left: in bed;with call bell/phone within reach;with family/visitor present;with SCD's reapplied Nurse Communication: Mobility status PT Visit Diagnosis: Other abnormalities of gait  and mobility (R26.89);Muscle weakness (generalized) (M62.81)     Time: 2409-7353 PT Time Calculation (min) (ACUTE ONLY): 30 min  Charges:  $Gait Training: 8-22 mins $Therapeutic Exercise: 8-22 mins                     Dicy Smigel B. Migdalia Dk PT, DPT Acute Rehabilitation Services Pager [phone] Office [phone]    [place] 10/11/2020, 10:20 AM

## 2020-10-11 NOTE — Discharge Summary (Signed)
Patient ID: Michelle Carrillo MRN: [id number] DOB/AGE: 18-Mar-1951 70 y.o.  Admit date: 10/10/2020 Discharge date: 10/11/2020  Admission Diagnoses:  Principal Problem:   Primary osteoarthritis of right knee Active Problems:   Status post total right knee replacement   Discharge Diagnoses:  Same  Past Medical History:  Diagnosis Date   Anxiety    on meds   Arthritis    bilateral knees   Cataract    RIGHT eye   Depression    on meds   Herpes zoster kerstitis s/p corneal transplant 2015    Hypertension    on meds   Right knee osteoarthritis    Strabismus    due to eye injury as a child   Stroke ([place]) 07/2019   stroke with craniotomy    Surgeries: Procedure(s): RIGHT TOTAL KNEE ARTHROPLASTY on 10/10/2020   Consultants:   Discharged Condition: Improved  Hospital Course: Michelle Carrillo is an 70 y.o. female who was admitted 10/10/2020 for operative treatment ofPrimary osteoarthritis of right knee. Patient has severe unremitting pain that affects sleep, daily activities, and work/hobbies. After pre-op clearance the patient was taken to the operating room on 10/10/2020 and underwent  Procedure(s): RIGHT TOTAL KNEE ARTHROPLASTY.    Patient was given perioperative antibiotics:  Anti-infectives (From admission, onward)    Start     Dose/Rate Route Frequency Ordered Stop   10/10/20 1700  ceFAZolin (ANCEF) IVPB 2g/100 mL premix        2 g 200 mL/hr over 30 Minutes Intravenous Every 6 hours 10/10/20 1418 10/10/20 2328   10/10/20 1201  vancomycin (VANCOCIN) powder  Status:  Discontinued          As needed 10/10/20 1201 10/10/20 1322   10/10/20 0900  ceFAZolin (ANCEF) IVPB 2g/100 mL premix        2 g 200 mL/hr over 30 Minutes Intravenous On call to O.R. 10/10/20 0845 10/10/20 1111        Patient was given sequential compression devices, early ambulation, and chemoprophylaxis to prevent DVT.  Patient benefited maximally from hospital stay and there were no  complications.    Recent vital signs: Patient Vitals for the past 24 hrs:  BP Temp Temp src Pulse Resp SpO2 Height Weight  10/11/20 0312 (!) 114/57 (!) 97.4 F (36.3 C) Oral 71 18 100 % -- --  10/10/20 2216 121/62 98.1 F (36.7 C) Oral 72 18 96 % -- --  10/10/20 1609 (!) 119/59 97.8 F (36.6 C) Oral 81 16 95 % -- --  10/10/20 1421 (!) 115/56 97.7 F (36.5 C) Oral 69 16 -- -- --  10/10/20 1355 (!) 105/58 (!) 97.1 F (36.2 C) -- 78 13 97 % -- --  10/10/20 1340 (!) 102/51 -- -- 81 15 99 % -- --  10/10/20 1325 (!) 100/52 (!) 97.2 F (36.2 C) -- 79 14 98 % -- --  10/10/20 1032 (!) 110/46 -- -- 74 13 97 % -- --  10/10/20 1025 (!) 139/56 -- -- 80 16 99 % -- --  10/10/20 0853 (!) 148/50 98.2 F (36.8 C) Oral 95 18 99 % 5' (1.524 m) 68.6 kg     Recent laboratory studies: No results for input(s): WBC, HGB, HCT, PLT, NA, K, CL, CO2, BUN, CREATININE, GLUCOSE, INR, CALCIUM in the last 72 hours.  Invalid input(s): PT, 2   Discharge Medications:   Allergies as of 10/11/2020   No Known Allergies      Medication List     STOP  taking these medications    prednisoLONE acetate 1 % ophthalmic suspension Commonly known as: PRED FORTE       TAKE these medications    aspirin EC 81 MG tablet Take 1 tablet (81 mg total) by mouth 2 (two) times daily. To be taken after surgery   atorvastatin 40 MG tablet Commonly known as: LIPITOR Take 1 tablet (40 mg total) by mouth daily.   docusate sodium 100 MG capsule Commonly known as: Colace Take 1 capsule (100 mg total) by mouth daily as needed.   lisinopril 20 MG tablet Commonly known as: ZESTRIL Take 1 tablet (20 mg total) by mouth daily.   methocarbamol 500 MG tablet Commonly known as: Robaxin Take 1 tablet (500 mg total) by mouth 2 (two) times daily as needed. To be taken after surgery   multivitamin with minerals Tabs tablet Take 1 tablet by mouth daily.   ondansetron 4 MG tablet Commonly known as: Zofran Take 1 tablet (4 mg  total) by mouth every 8 (eight) hours as needed for nausea or vomiting.   oxyCODONE-acetaminophen 5-325 MG tablet Commonly known as: Percocet Take 1-2 tablets by mouth every 6 (six) hours as needed. To be taken after surgery       ASK your doctor about these medications    amLODipine 10 MG tablet Commonly known as: NORVASC Take 1 tablet by mouth once daily   PARoxetine 20 MG tablet Commonly known as: Paxil Take 1 tablet (20 mg total) by mouth daily.   Systane Ultra 0.4-0.3 % Soln Generic drug: Polyethyl Glycol-Propyl Glycol Apply 1-2 drops to eye daily as needed.               Durable Medical Equipment  (From admission, onward)           Start     Ordered   10/10/20 1419  DME Walker rolling  Once       Question Answer Comment  Walker: With 5 Inch Wheels   Patient needs a walker to treat with the following condition Status post left partial knee replacement      10/10/20 1418   10/10/20 1419  DME 3 n 1  Once        10/10/20 1418   10/10/20 1419  DME Bedside commode  Once       Question:  Patient needs a bedside commode to treat with the following condition  Answer:  Status post left partial knee replacement   10/10/20 1418            Diagnostic Studies: DG Chest 2 View  Result Date: 10/08/2020 CLINICAL DATA:  70 year old female with preoperative chest x-ray EXAM: CHEST - 2 VIEW COMPARISON:  None. FINDINGS: Cardiomediastinal silhouette within normal limits in size and contour. No evidence of central vascular congestion. No interlobular septal thickening. No pneumothorax or pleural effusion. Coarsened interstitial markings, with no confluent airspace disease. No acute displaced fracture. Degenerative changes of the spine. IMPRESSION: Negative for acute cardiopulmonary disease Electronically Signed   By: Michelle Carrillo D.O.   On: 10/08/2020 08:34   DG Knee Right Port  Result Date: 10/10/2020 CLINICAL DATA:  Status post right knee replacement EXAM: PORTABLE  RIGHT KNEE - 1-2 VIEW COMPARISON:  07/15/2020 FINDINGS: Right knee prosthesis is now seen in satisfactory position. No acute bony or soft tissue abnormality is noted. IMPRESSION: Status post right knee replacement Electronically Signed   By: Michelle Carrillo M.D.   On: 10/10/2020 15:21    Disposition: Discharge disposition:  01-Home or Self Care          Follow-up Information     Leandrew Koyanagi, MD. Go on 10/24/2020.   Specialty: Orthopedic Surgery Why: at 10:30 am for your 2 week appointment in our office with Dr. Sherilyn Cooter information: [place] [id number] [phone]         [facility]. Go on 10/27/2020.   Specialty: Rehabilitation Why: at 9:30 am for your first Outpatient Physical Therapy appointment. Please check in upstairs at physical therapy check in desk. Contact information: [address] [place] [address] [phone]                 Signed: Mcarthur Rossetti 10/11/2020, 8:16 AM

## 2020-10-12 DIAGNOSIS — I10 Essential (primary) hypertension: Secondary | ICD-10-CM | POA: Diagnosis not present

## 2020-10-12 DIAGNOSIS — Z96651 Presence of right artificial knee joint: Secondary | ICD-10-CM | POA: Diagnosis not present

## 2020-10-12 DIAGNOSIS — Z8673 Personal history of transient ischemic attack (TIA), and cerebral infarction without residual deficits: Secondary | ICD-10-CM | POA: Diagnosis not present

## 2020-10-12 DIAGNOSIS — F419 Anxiety disorder, unspecified: Secondary | ICD-10-CM | POA: Diagnosis not present

## 2020-10-12 DIAGNOSIS — Z471 Aftercare following joint replacement surgery: Secondary | ICD-10-CM | POA: Diagnosis not present

## 2020-10-12 DIAGNOSIS — Z9181 History of falling: Secondary | ICD-10-CM | POA: Diagnosis not present

## 2020-10-12 DIAGNOSIS — Z7982 Long term (current) use of aspirin: Secondary | ICD-10-CM | POA: Diagnosis not present

## 2020-10-12 DIAGNOSIS — M1712 Unilateral primary osteoarthritis, left knee: Secondary | ICD-10-CM | POA: Diagnosis not present

## 2020-10-12 DIAGNOSIS — H269 Unspecified cataract: Secondary | ICD-10-CM | POA: Diagnosis not present

## 2020-10-12 DIAGNOSIS — F32A Depression, unspecified: Secondary | ICD-10-CM | POA: Diagnosis not present

## 2020-10-14 ENCOUNTER — Encounter: Payer: Self-pay | Admitting: *Deleted

## 2020-10-14 ENCOUNTER — Encounter: Payer: Self-pay | Admitting: Internal Medicine

## 2020-10-15 DIAGNOSIS — M1712 Unilateral primary osteoarthritis, left knee: Secondary | ICD-10-CM | POA: Diagnosis not present

## 2020-10-15 DIAGNOSIS — Z471 Aftercare following joint replacement surgery: Secondary | ICD-10-CM | POA: Diagnosis not present

## 2020-10-15 DIAGNOSIS — I10 Essential (primary) hypertension: Secondary | ICD-10-CM | POA: Diagnosis not present

## 2020-10-15 DIAGNOSIS — H269 Unspecified cataract: Secondary | ICD-10-CM | POA: Diagnosis not present

## 2020-10-15 DIAGNOSIS — F419 Anxiety disorder, unspecified: Secondary | ICD-10-CM | POA: Diagnosis not present

## 2020-10-15 DIAGNOSIS — F32A Depression, unspecified: Secondary | ICD-10-CM | POA: Diagnosis not present

## 2020-10-16 ENCOUNTER — Encounter (HOSPITAL_COMMUNITY): Payer: Self-pay | Admitting: Orthopaedic Surgery

## 2020-10-17 DIAGNOSIS — F32A Depression, unspecified: Secondary | ICD-10-CM | POA: Diagnosis not present

## 2020-10-17 DIAGNOSIS — I10 Essential (primary) hypertension: Secondary | ICD-10-CM | POA: Diagnosis not present

## 2020-10-17 DIAGNOSIS — F419 Anxiety disorder, unspecified: Secondary | ICD-10-CM | POA: Diagnosis not present

## 2020-10-17 DIAGNOSIS — M1712 Unilateral primary osteoarthritis, left knee: Secondary | ICD-10-CM | POA: Diagnosis not present

## 2020-10-17 DIAGNOSIS — H269 Unspecified cataract: Secondary | ICD-10-CM | POA: Diagnosis not present

## 2020-10-17 DIAGNOSIS — Z471 Aftercare following joint replacement surgery: Secondary | ICD-10-CM | POA: Diagnosis not present

## 2020-10-20 ENCOUNTER — Telehealth: Payer: Self-pay | Admitting: *Deleted

## 2020-10-20 NOTE — Care Plan (Signed)
Ortho bundle D/C and 7 day calls combined due to [facility] out of office with no access to documentation during that time. Spoke with patient's daughter, Michelle Carrillo, who states she is doing well with therapy, but after therapy leaves, patient having a hard time and not wanting to do anything. Discussed appointment with Dr. Erlinda Hong this Friday. No other needs at this time. Will continue to follow for needs.

## 2020-10-20 NOTE — Telephone Encounter (Signed)
Ortho bundle D/C and 1 week call completed together due to [facility] out of office during those calls with no access to documentation. See care plan note.

## 2020-10-21 ENCOUNTER — Ambulatory Visit: Payer: Medicare Other | Admitting: Physical Therapy

## 2020-10-21 DIAGNOSIS — F419 Anxiety disorder, unspecified: Secondary | ICD-10-CM | POA: Diagnosis not present

## 2020-10-21 DIAGNOSIS — F32A Depression, unspecified: Secondary | ICD-10-CM | POA: Diagnosis not present

## 2020-10-21 DIAGNOSIS — H269 Unspecified cataract: Secondary | ICD-10-CM | POA: Diagnosis not present

## 2020-10-21 DIAGNOSIS — I10 Essential (primary) hypertension: Secondary | ICD-10-CM | POA: Diagnosis not present

## 2020-10-21 DIAGNOSIS — Z471 Aftercare following joint replacement surgery: Secondary | ICD-10-CM | POA: Diagnosis not present

## 2020-10-21 DIAGNOSIS — M1712 Unilateral primary osteoarthritis, left knee: Secondary | ICD-10-CM | POA: Diagnosis not present

## 2020-10-23 DIAGNOSIS — F419 Anxiety disorder, unspecified: Secondary | ICD-10-CM | POA: Diagnosis not present

## 2020-10-23 DIAGNOSIS — I10 Essential (primary) hypertension: Secondary | ICD-10-CM | POA: Diagnosis not present

## 2020-10-23 DIAGNOSIS — F32A Depression, unspecified: Secondary | ICD-10-CM | POA: Diagnosis not present

## 2020-10-23 DIAGNOSIS — Z471 Aftercare following joint replacement surgery: Secondary | ICD-10-CM | POA: Diagnosis not present

## 2020-10-23 DIAGNOSIS — M1712 Unilateral primary osteoarthritis, left knee: Secondary | ICD-10-CM | POA: Diagnosis not present

## 2020-10-23 DIAGNOSIS — H269 Unspecified cataract: Secondary | ICD-10-CM | POA: Diagnosis not present

## 2020-10-24 ENCOUNTER — Ambulatory Visit (INDEPENDENT_AMBULATORY_CARE_PROVIDER_SITE_OTHER): Payer: Medicare Other | Admitting: Orthopaedic Surgery

## 2020-10-24 ENCOUNTER — Other Ambulatory Visit: Payer: Self-pay

## 2020-10-24 ENCOUNTER — Telehealth: Payer: Self-pay | Admitting: *Deleted

## 2020-10-24 ENCOUNTER — Encounter: Payer: Self-pay | Admitting: Orthopaedic Surgery

## 2020-10-24 DIAGNOSIS — Z96651 Presence of right artificial knee joint: Secondary | ICD-10-CM

## 2020-10-24 MED ORDER — METHOCARBAMOL 500 MG PO TABS: 500.0000 mg | ORAL_TABLET | Freq: Two times a day (BID) | ORAL | 5 refills | Status: DC | PRN

## 2020-10-24 MED ORDER — OXYCODONE-ACETAMINOPHEN 5-325 MG PO TABS: 1.0000 | ORAL_TABLET | Freq: Three times a day (TID) | ORAL | 0 refills | Status: DC | PRN

## 2020-10-24 NOTE — Progress Notes (Signed)
Post-Op Visit Note   Patient: Michelle Carrillo           Date of Birth: 08-Mar-1951           MRN: [id number] Visit Date: 10/24/2020 PCP: Orvis Brill, MD   Assessment & Plan:  Chief Complaint:  Chief Complaint  Patient presents with   Right Knee - Pain   Visit Diagnoses:  1. Status post total right knee replacement     Plan: Patient returns 2 weeks status post right total knee replacement.  Overall doing well.  She has completed home health PT.  Requesting refill on oxycodone and Robaxin.  Outpatient physical therapy is scheduled for this coming Monday.  CPM is well-tolerated.  Right knee surgical incision is healed.  No signs of infection.  Range of motion is 10 to 90degrees.  Stable to varus valgus.  No evidence of drainage.  Patient is doing well from the surgery and pleased with her progress.  We remove the sutures in place Steri-Strips.  Wound care instructions reviewed.  Continue aspirin for DVT prophylaxis.  Percocet and Robaxin refilled today.  Implant card provided.  Follow-up in 4 weeks with two-view x-rays of the right knee.  Follow-Up Instructions: Return in about 4 weeks (around 11/21/2020).   Orders:  No orders of the defined types were placed in this encounter.  Meds ordered this encounter  Medications   oxyCODONE-acetaminophen (PERCOCET) 5-325 MG tablet    Sig: Take 1-2 tablets by mouth every 8 (eight) hours as needed. To be taken after surgery    Dispense:  40 tablet    Refill:  0   methocarbamol (ROBAXIN) 500 MG tablet    Sig: Take 1 tablet (500 mg total) by mouth 2 (two) times daily as needed. To be taken after surgery    Dispense:  30 tablet    Refill:  5    Imaging: No results found.  PMFS History: Patient Active Problem List   Diagnosis Date Noted   Status post total right knee replacement 10/10/2020   Vitamin D deficiency 04/24/2020   Chronic RUQ pain 01/14/2020   Hyperlipemia 08/23/2019   ICH (intracerebral hemorrhage) (HCC) L  parietal w/ L SDH s/p crani 08/09/2019   Healthcare maintenance 06/20/2019   History of strabismus surgery 03/31/2019   Moderate, recurrent MDD 06/06/2018   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 06/06/2018   Primary osteoarthritis of right knee 06/05/2018   Essential hypertension 06/05/2018   Anxiety 06/05/2018   Age-related nuclear cataract of both eyes 11/26/2014   Diplopia 11/26/2014   Monocular esotropia of left eye 11/26/2014   Past Medical History:  Diagnosis Date   Anxiety    on meds   Arthritis    bilateral knees   Cataract    RIGHT eye   Depression    on meds   Herpes zoster kerstitis s/p corneal transplant 2015    Hypertension    on meds   Right knee osteoarthritis    Strabismus    due to eye injury as a child   Stroke ([place]) 07/2019   stroke with craniotomy    Family History  Problem Relation Age of Onset   Diabetes Mother    Ovarian cancer Mother    High blood pressure Sister    Diabetes Sister    Diabetes Brother    Stomach cancer Maternal Grandmother 25   Colon polyps Neg Hx    Colon cancer Neg Hx  Rectal cancer Neg Hx    Esophageal cancer Neg Hx     Past Surgical History:  Procedure Laterality Date   CESAREAN SECTION      x 2    CHOLECYSTECTOMY     COLONOSCOPY  04/22/2020   CORNEAL TRANSPLANT Left    2006 and 2016   CRANIOTOMY Left 08/09/2019   Procedure: CRANIOTOMY HEMATOMA EVACUATION SUBDURAL;  Surgeon: Ashok Pall, MD;  Location: [place];  Service: Neurosurgery;  Laterality: Left;  left   EYE SURGERY     TOTAL KNEE ARTHROPLASTY Right 10/10/2020   Procedure: RIGHT TOTAL KNEE ARTHROPLASTY;  Surgeon: Leandrew Koyanagi, MD;  Location: [place];  Service: Orthopedics;  Laterality: Right;   TUBAL LIGATION     Social History   Occupational History   Not on file  Tobacco Use   Smoking status: Never   Smokeless tobacco: Never  Vaping Use   Vaping Use: Never used  Substance and Sexual Activity   Alcohol use: Never   Drug  use: Never   Sexual activity: Not on file

## 2020-10-24 NOTE — Telephone Encounter (Signed)
Ortho bundle 14 day in office meeting completed. °

## 2020-10-27 ENCOUNTER — Ambulatory Visit (INDEPENDENT_AMBULATORY_CARE_PROVIDER_SITE_OTHER): Payer: Medicare Other | Admitting: Physical Therapy

## 2020-10-27 ENCOUNTER — Ambulatory Visit: Payer: Medicare Other | Admitting: Physical Therapy

## 2020-10-27 ENCOUNTER — Other Ambulatory Visit: Payer: Self-pay

## 2020-10-27 DIAGNOSIS — M6281 Muscle weakness (generalized): Secondary | ICD-10-CM

## 2020-10-27 DIAGNOSIS — R600 Localized edema: Secondary | ICD-10-CM

## 2020-10-27 DIAGNOSIS — M25561 Pain in right knee: Secondary | ICD-10-CM | POA: Diagnosis not present

## 2020-10-27 DIAGNOSIS — G8929 Other chronic pain: Secondary | ICD-10-CM | POA: Diagnosis not present

## 2020-10-27 DIAGNOSIS — R262 Difficulty in walking, not elsewhere classified: Secondary | ICD-10-CM

## 2020-10-27 NOTE — Therapy (Signed)
[facility] Physical Therapy [address] [place], [place], [address] Phone: [phone]   Fax:  [phone]  Physical Therapy Evaluation  Patient Details  Name: Michelle Carrillo MRN: [id number] Date of Birth: July 23, 1950 Referring Provider (PT): Dollene Primrose   Encounter Date: 10/27/2020   PT End of Session - 10/27/20 1021     Visit Number 1    Number of Visits 15    Date for PT Re-Evaluation 12/22/20    Authorization Type MCR    PT Start Time 0942    PT Stop Time 1015    PT Time Calculation (min) 33 min    Activity Tolerance Patient tolerated treatment well    Behavior During Therapy [facility] for tasks assessed/performed             Past Medical History:  Diagnosis Date   Anxiety    on meds   Arthritis    bilateral knees   Cataract    RIGHT eye   Depression    on meds   Herpes zoster kerstitis s/p corneal transplant 2015    Hypertension    on meds   Right knee osteoarthritis    Strabismus    due to eye injury as a child   Stroke ([place]) 07/2019   stroke with craniotomy    Past Surgical History:  Procedure Laterality Date   CESAREAN SECTION      x 2    CHOLECYSTECTOMY     COLONOSCOPY  04/22/2020   CORNEAL TRANSPLANT Left    2006 and 2016   CRANIOTOMY Left 08/09/2019   Procedure: CRANIOTOMY HEMATOMA EVACUATION SUBDURAL;  Surgeon: Ashok Pall, MD;  Location: [place];  Service: Neurosurgery;  Laterality: Left;  left   EYE SURGERY     TOTAL KNEE ARTHROPLASTY Right 10/10/2020   Procedure: RIGHT TOTAL KNEE ARTHROPLASTY;  Surgeon: Leandrew Koyanagi, MD;  Location: [place];  Service: Orthopedics;  Laterality: Right;   TUBAL LIGATION      There were no vitals filed for this visit.    Subjective Assessment - 10/27/20 0945     Subjective She had Rt TKA on 10/10/20 and had 3 HHPT she reports.    Pertinent History Rt TKA,CVA    Limitations Standing;Walking;House hold activities    How long can you stand comfortably? 5 min    Patient Stated Goals reduce  pain    Currently in Pain? Yes    Pain Score 7     Pain Location Knee    Pain Orientation Right    Pain Descriptors / Indicators Aching;Burning    Pain Type Surgical pain    Pain Radiating Towards denies N/T    Pain Onset 1 to 4 weeks ago    Pain Frequency Intermittent    Aggravating Factors  too much activity or stretching    Pain Relieving Factors ice                [facility] PT Assessment - 10/27/20 0001       Assessment   Medical Diagnosis Rt TKA 10/10/20    Referring Provider (PT) Erlinda Hong, N    Onset Date/Surgical Date 10/10/20    Next MD Visit 11/21/20      Restrictions   Weight Bearing Restrictions No      Balance Screen   Has the patient fallen in the past 6 months Yes    How many times? 1   fell back into chair 5 months ago   Has the patient had a  decrease in activity level because of a fear of falling?  No    Is the patient reluctant to leave their home because of a fear of falling?  No      Home Ec[employer] residence    Additional Comments 3 steps, relays no difficulty with this      Prior Function   Level of Independence Independent      Observation/Other Assessments   Focus on Therapeutic Outcomes (FOTO)  will do after session or 2nd visit, pt arrives late      ROM / Strength   AROM / PROM / Strength AROM;PROM;Strength      AROM   AROM Assessment Site Knee    Right/Left Knee Right    Right Knee Extension -5    Right Knee Flexion 70      PROM   PROM Assessment Site Knee    Right/Left Knee Right    Right Knee Extension -4    Right Knee Flexion 100      Strength   Overall Strength Comments Rt hip/knee strength 3+ overall grossly tested in sitting      Ambulation/Gait   Ambulation/Gait Yes    Ambulation/Gait Assistance 4: Min guard    Ambulation Distance (Feet) 50 Feet    Assistive device Large base quad cane    Gait Comments slower velocity, decreased peripheral vision from CVA, Some difficulty with coordination, did  not use AD prior to TKA                        Objective measurements completed on examination: See above findings.       [place] [facility] Adult PT Treatment/Exercise - 10/27/20 0001       Exercises   Exercises Knee/Hip      Knee/Hip Exercises: Stretches   Knee: Self-Stretch Limitations seated knee flexion tailgate stretch 10 sec X 10      Knee/Hip Exercises: Aerobic   Recumbent Bike L4 X 6 min UE/LE      Manual Therapy   Manual therapy comments Rt knee PROM to tolerance                    PT Education - 10/27/20 1021     Education Details HEP,POC    Person(s) Educated Patient;Caregiver(s)    Methods Explanation;Demonstration;Verbal cues;Handout    Comprehension Verbalized understanding;Need further instruction              PT Short Term Goals - 10/27/20 1025       PT SHORT TERM GOAL #1   Title Pt will be Independent and compliant with initial HEP    Time 4    Period Weeks    Status New    Target Date 11/24/20      PT SHORT TERM GOAL #2   Title Pt will complete FOTO assessement    Baseline -    Time 4    Period Weeks    Status New      PT SHORT TERM GOAL #3   Title -               PT Long Term Goals - 10/27/20 1026       PT LONG TERM GOAL #1   Title Patient will be independent with final progressive HEP for strengthening/balance (ALL LTGS DUE: 9/12/22_    Time 8    Period Weeks    Status New    Target Date  12/22/20      PT LONG TERM GOAL #2   Title Pt will be able to ambulate community distances mod I to independent with LRAD    Time 8    Period Weeks    Status New      PT LONG TERM GOAL #3   Title She will improve Rt knee ROM 0-115 degreees to improve funciton.    Time 8    Period Weeks    Status New      PT LONG TERM GOAL #4   Title She will improve Rt hip/knee strength to 5/5 MMT tested in sitting.    Time 8    Period Weeks    Status New      PT LONG TERM GOAL #5   Title She will have overall less than 3/10  pain with general activity.    Baseline 8    Period Weeks    Status New                    Plan - 10/27/20 1022     Clinical Impression Statement Pt presents with Rt TKA on 10/10/20. She is overall doing well for only 2 weeks post op. She will benefit from skilled PT to improve overall knee ROM, strength, pain, balance, gait, and overall function.    Personal Factors and Comorbidities Comorbidity 1    Comorbidities CVA with decreased peripheral vision and memore    Examination-Activity Limitations Carry;Lift;Stand;Stairs;Squat;Sleep;Locomotion Level;Transfers;Bend    Examination-Participation Restrictions Cleaning;Community Activity;Driving;Laundry;Shop    Stability/Clinical Decision Making Evolving/Moderate complexity    Clinical Decision Making Moderate    Rehab Potential Good    PT Frequency 2x / week   2-3   PT Duration 8 weeks    PT Treatment/Interventions ADLs/Self Care Home Management;Cryotherapy;Electrical Stimulation;Iontophoresis 4mg /ml Dexamethasone;Moist Heat;Ultrasound;Gait training;Therapeutic activities;Therapeutic exercise;Neuromuscular re-education;Balance training;Manual techniques;Passive range of motion;Dry needling;Joint Manipulations;Vasopneumatic Device;Taping    PT Next Visit Plan review and update HEP PRN, overall knee ROM, strength, gait, balance    PT Home Exercise Plan Access Code: [email or web address]    Consulted and Agree with Plan of Care Patient             Patient will benefit from skilled therapeutic intervention in order to improve the following deficits and impairments:  Decreased activity tolerance, Decreased balance, Decreased coordination, Decreased endurance, Decreased mobility, Decreased range of motion, Decreased strength, Difficulty walking, Impaired flexibility, Increased edema, Pain  Visit Diagnosis: Chronic pain of right knee  Difficulty in walking, not elsewhere classified  Muscle weakness (generalized)  Localized  edema     Problem List Patient Active Problem List   Diagnosis Date Noted   Status post total right knee replacement 10/10/2020   Vitamin D deficiency 04/24/2020   Chronic RUQ pain 01/14/2020   Hyperlipemia 08/23/2019   ICH (intracerebral hemorrhage) ([place]) L parietal w/ L SDH s/p crani 08/09/2019   Healthcare maintenance 06/20/2019   History of strabismus surgery 03/31/2019   Moderate, recurrent MDD 06/06/2018   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 [place], [place], [address] Phone: [phone]   Fax:  [phone]  Name: Michelle Carrillo MRN: [id number] Date of  Birth: Oct 27, 1950

## 2020-10-27 NOTE — Patient Instructions (Signed)
Access Code: [email or web address] URL: [email or web address] Date: 10/27/2020 Prepared by: Elsie Ra  Exercises Seated Hamstring Stretch - 2 x daily - 6 x weekly - 1 sets - 2 reps - 30 hold Seated Knee Flexion Stretch - 2 x daily - 6 x weekly - 1-2 sets - 10 reps - 5 sec hold Supine Active Straight Leg Raise - 2 x daily - 6 x weekly - 1-3 sets - 10 reps Seated Long Arc Quad - 2 x daily - 6 x weekly - 2-3 sets - 10 reps - 3 hold Supine Knee Extension Mobilization with Weight - 2 x daily - 6 x weekly - 1 sets - 3-5 min hold Standing Hip Abduction - 2 x daily - 6 x weekly - 2-3 sets - 10 reps Mini Squat with Counter Support - 2 x daily - 6 x weekly - 1-2 sets - 10 reps

## 2020-10-28 ENCOUNTER — Ambulatory Visit (INDEPENDENT_AMBULATORY_CARE_PROVIDER_SITE_OTHER): Payer: Medicare Other | Admitting: Physical Therapy

## 2020-10-28 DIAGNOSIS — M25561 Pain in right knee: Secondary | ICD-10-CM | POA: Diagnosis not present

## 2020-10-28 DIAGNOSIS — R600 Localized edema: Secondary | ICD-10-CM

## 2020-10-28 DIAGNOSIS — M6281 Muscle weakness (generalized): Secondary | ICD-10-CM

## 2020-10-28 DIAGNOSIS — R262 Difficulty in walking, not elsewhere classified: Secondary | ICD-10-CM | POA: Diagnosis not present

## 2020-10-28 DIAGNOSIS — G8929 Other chronic pain: Secondary | ICD-10-CM

## 2020-10-28 NOTE — Therapy (Signed)
[facility] Physical Therapy [address] [place], [place], [address] Phone: [phone]   Fax:  [phone]  Physical Therapy Treatment  Patient Details  Name: Michelle Carrillo MRN: [id number] Date of Birth: [birth year] Referring Provider (PT): Dollene Primrose   Encounter Date: 10/28/2020   PT End of Session - 10/28/20 1226     Visit Number 2    Number of Visits 15    Date for PT Re-Evaluation 12/22/20    Authorization Type MCR    PT Start Time 9735    PT Stop Time 1225    PT Time Calculation (min) 40 min    Activity Tolerance Patient tolerated treatment well    Behavior During Therapy [facility] for tasks assessed/performed             Past Medical History:  Diagnosis Date   Anxiety    on meds   Arthritis    bilateral knees   Cataract    RIGHT eye   Depression    on meds   Herpes zoster kerstitis s/p corneal transplant 2015    Hypertension    on meds   Right knee osteoarthritis    Strabismus    due to eye injury as a child   Stroke ([place]) 07/2019   stroke with craniotomy    Past Surgical History:  Procedure Laterality Date   CESAREAN SECTION      x 2    CHOLECYSTECTOMY     COLONOSCOPY  04/22/2020   CORNEAL TRANSPLANT Left    2006 and 2016   CRANIOTOMY Left 08/09/2019   Procedure: CRANIOTOMY HEMATOMA EVACUATION SUBDURAL;  Surgeon: Ashok Pall, MD;  Location: [place];  Service: Neurosurgery;  Laterality: Left;  left   EYE SURGERY     TOTAL KNEE ARTHROPLASTY Right 10/10/2020   Procedure: RIGHT TOTAL KNEE ARTHROPLASTY;  Surgeon: Leandrew Koyanagi, MD;  Location: [place];  Service: Orthopedics;  Laterality: Right;   TUBAL LIGATION      There were no vitals filed for this visit.   Subjective Assessment - 10/28/20 1204     Subjective She relays her knee feels better than last time.    Pertinent History Rt TKA,CVA    Limitations Standing;Walking;House hold activities    How long can you stand comfortably? 5 min    Patient Stated Goals reduce pain     Pain Onset 1 to 4 weeks ago              [facility] Adult PT Treatment/Exercise - 10/28/20 0001       Knee/Hip Exercises: Stretches   Sports administrator Right;3 reps;30 seconds    Quad Stretch Limitations supine with strap leg EOB    Knee: Self-Stretch Limitations seated knee flexion tailgate stretch 10 sec X 10, supine heelslides 5 sec X 15 AAROM      Knee/Hip Exercises: Aerobic   Recumbent Bike Rocking at first  then made full revolution at very end of 10 min seat #2      Knee/Hip Exercises: Seated   Long Arc Quad Left;3 sets;10 reps    Sit to [employer] 10 reps;without UE support   low mat as low as it goes     Knee/Hip Exercises: Supine   Straight Leg Raises Right;2 sets;10 reps      Manual Therapy   Manual therapy comments Rt knee PROM to tolerance                      PT Short Term  Goals - 10/27/20 1025       PT SHORT TERM GOAL #1   Title Pt will be Independent and compliant with initial HEP    Time 4    Period Weeks    Status New    Target Date 11/24/20      PT SHORT TERM GOAL #2   Title Pt will complete FOTO assessement    Baseline -    Time 4    Period Weeks    Status New      PT SHORT TERM GOAL #3   Title -               PT Long Term Goals - 10/27/20 1026       PT LONG TERM GOAL #1   Title Patient will be independent with final progressive HEP for strengthening/balance (ALL LTGS DUE: 9/12/22_    Time 8    Period Weeks    Status New    Target Date 12/22/20      PT LONG TERM GOAL #2   Title Pt will be able to ambulate community distances mod I to independent with LRAD    Time 8    Period Weeks    Status New      PT LONG TERM GOAL #3   Title She will improve Rt knee ROM 0-115 degreees to improve funciton.    Time 8    Period Weeks    Status New      PT LONG TERM GOAL #4   Title She will improve Rt hip/knee strength to 5/5 MMT tested in sitting.    Time 8    Period Weeks    Status New      PT LONG TERM GOAL #5   Title She will  have overall less than 3/10 pain with general activity.    Baseline 8    Period Weeks    Status New                   Plan - 10/28/20 1228     Clinical Impression Statement She is showing good early progress with her knee ROM and quad activation. PT will attempt to progress her as able with her pain tolerance. She does require many multimodal cues due to some residual cognitive deficits from CVA.    Personal Factors and Comorbidities Comorbidity 1    Comorbidities CVA with decreased peripheral vision and memore    Examination-Activity Limitations Carry;Lift;Stand;Stairs;Squat;Sleep;Locomotion Level;Transfers;Bend    Examination-Participation Restrictions Cleaning;Community Activity;Driving;Laundry;Shop    Stability/Clinical Decision Making Evolving/Moderate complexity    Rehab Potential Good    PT Frequency 2x / week   2-3   PT Duration 8 weeks    PT Treatment/Interventions ADLs/Self Care Home Management;Cryotherapy;Electrical Stimulation;Iontophoresis 4mg /ml Dexamethasone;Moist Heat;Ultrasound;Gait training;Therapeutic activities;Therapeutic exercise;Neuromuscular re-education;Balance training;Manual techniques;Passive range of motion;Dry needling;Joint Manipulations;Vasopneumatic Device;Taping    PT Next Visit Plan review and update HEP PRN, overall knee ROM, strength, gait, balance    PT Home Exercise Plan Access Code: [email or web address]    Consulted and Agree with Plan of Care Patient             Patient will benefit from skilled therapeutic intervention in order to improve the following deficits and impairments:  Decreased activity tolerance, Decreased balance, Decreased coordination, Decreased endurance, Decreased mobility, Decreased range of motion, Decreased strength, Difficulty walking, Impaired flexibility, Increased edema, Pain  Visit Diagnosis: Chronic pain of right knee  Difficulty in walking, not elsewhere classified  Muscle weakness (generalized)  Localized  edema     Problem List Patient Active Problem List   Diagnosis Date Noted   Status post total right knee replacement 10/10/2020   Vitamin D deficiency 04/24/2020   Chronic RUQ pain 01/14/2020   Hyperlipemia 08/23/2019   ICH (intracerebral hemorrhage) ([place]) L parietal w/ L SDH s/p crani 08/09/2019   Healthcare maintenance 06/20/2019   History of strabismus surgery 03/31/2019   Moderate, recurrent MDD 06/06/2018   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

## 2020-11-03 ENCOUNTER — Other Ambulatory Visit: Payer: Self-pay

## 2020-11-03 ENCOUNTER — Ambulatory Visit (INDEPENDENT_AMBULATORY_CARE_PROVIDER_SITE_OTHER): Payer: Medicare Other | Admitting: Physical Therapy

## 2020-11-03 DIAGNOSIS — R600 Localized edema: Secondary | ICD-10-CM

## 2020-11-03 DIAGNOSIS — M25561 Pain in right knee: Secondary | ICD-10-CM

## 2020-11-03 DIAGNOSIS — M6281 Muscle weakness (generalized): Secondary | ICD-10-CM | POA: Diagnosis not present

## 2020-11-03 DIAGNOSIS — G8929 Other chronic pain: Secondary | ICD-10-CM | POA: Diagnosis not present

## 2020-11-03 DIAGNOSIS — R262 Difficulty in walking, not elsewhere classified: Secondary | ICD-10-CM | POA: Diagnosis not present

## 2020-11-03 NOTE — Therapy (Signed)
[facility] Physical Therapy [address] [place], [place], [address] Phone: [phone]   Fax:  [phone]  Physical Therapy Treatment  Patient Details  Name: Michelle Carrillo MRN: [id number] Date of Birth: [birth year] Referring Provider (PT): Dollene Primrose   Encounter Date: 11/03/2020   PT End of Session - 11/03/20 1154     Visit Number 3    Number of Visits 15    Date for PT Re-Evaluation 12/22/20    Authorization Type MCR    PT Start Time [id number]    PT Stop Time 1225    PT Time Calculation (min) 41 min    Activity Tolerance Patient tolerated treatment well    Behavior During Therapy [facility] for tasks assessed/performed             Past Medical History:  Diagnosis Date   Anxiety    on meds   Arthritis    bilateral knees   Cataract    RIGHT eye   Depression    on meds   Herpes zoster kerstitis s/p corneal transplant 2015    Hypertension    on meds   Right knee osteoarthritis    Strabismus    due to eye injury as a child   Stroke ([place]) 07/2019   stroke with craniotomy    Past Surgical History:  Procedure Laterality Date   CESAREAN SECTION      x 2    CHOLECYSTECTOMY     COLONOSCOPY  04/22/2020   CORNEAL TRANSPLANT Left    2006 and 2016   CRANIOTOMY Left 08/09/2019   Procedure: CRANIOTOMY HEMATOMA EVACUATION SUBDURAL;  Surgeon: Ashok Pall, MD;  Location: [place];  Service: Neurosurgery;  Laterality: Left;  left   EYE SURGERY     TOTAL KNEE ARTHROPLASTY Right 10/10/2020   Procedure: RIGHT TOTAL KNEE ARTHROPLASTY;  Surgeon: Leandrew Koyanagi, MD;  Location: [place];  Service: Orthopedics;  Laterality: Right;   TUBAL LIGATION      There were no vitals filed for this visit.   Subjective Assessment - 11/03/20 1151     Subjective She relays her knee is 7/10 pain, feels better after taking pain meds.    Pertinent History Rt TKA,CVA    Limitations Standing;Walking;House hold activities    How long can you stand comfortably? 5 min    Patient  Stated Goals reduce pain    Pain Onset 1 to 4 weeks ago                [facility] PT Assessment - 11/03/20 0001       Assessment   Medical Diagnosis Rt TKA 10/10/20    Referring Provider (PT) Erlinda Hong, N    Onset Date/Surgical Date 10/10/20    Next MD Visit 11/21/20      PROM   Right Knee Extension -4    Right Knee Flexion 101      Strength   Overall Strength Comments Rt hip/knee strength overall 4/5 MMT grossly                           [facility] Adult PT Treatment/Exercise - 11/03/20 0001       Knee/Hip Exercises: Stretches   Sports administrator Right;3 reps;30 seconds    Quad Stretch Limitations supine with strap leg EOB    Knee: Self-Stretch Limitations seated knee flexion tailgate stretch 10 sec X 2 min      Knee/Hip Exercises: Aerobic   Nustep L6X10 min UE/LE  Knee/Hip Exercises: Seated   Long Arc Quad Right;2 sets;15 reps    Long Arc Quad Weight 2 lbs.    Sit to Sand 15 reps;without UE support   low mat table at lowest position     Knee/Hip Exercises: Supine   Heel Slides AAROM;Right;15 reps    Heel Slides Limitations hold 5 sec    Straight Leg Raises Right;2 sets;10 reps      Manual Therapy   Manual therapy comments Rt knee PROM to tolerance and manual H.S. stretching                      PT Short Term Goals - 10/27/20 1025       PT SHORT TERM GOAL #1   Title Pt will be Independent and compliant with initial HEP    Time 4    Period Weeks    Status New    Target Date 11/24/20      PT SHORT TERM GOAL #2   Title Pt will complete FOTO assessement    Baseline -    Time 4    Period Weeks    Status New      PT SHORT TERM GOAL #3   Title -               PT Long Term Goals - 10/27/20 1026       PT LONG TERM GOAL #1   Title Patient will be independent with final progressive HEP for strengthening/balance (ALL LTGS DUE: 9/12/22_    Time 8    Period Weeks    Status New    Target Date 12/22/20      PT LONG TERM GOAL #2    Title Pt will be able to ambulate community distances mod I to independent with LRAD    Time 8    Period Weeks    Status New      PT LONG TERM GOAL #3   Title She will improve Rt knee ROM 0-115 degreees to improve funciton.    Time 8    Period Weeks    Status New      PT LONG TERM GOAL #4   Title She will improve Rt hip/knee strength to 5/5 MMT tested in sitting.    Time 8    Period Weeks    Status New      PT LONG TERM GOAL #5   Title She will have overall less than 3/10 pain with general activity.    Baseline 8    Period Weeks    Status New                   Plan - 11/03/20 1223     Clinical Impression Statement She was more confused today with proper exercise techniques and not able to follow instructions as easily. She required max cues for this today. PT will continue to work to improve her overall knee strength and ROM to maximize her function.    Personal Factors and Comorbidities Comorbidity 1    Comorbidities CVA with decreased peripheral vision and memore    Examination-Activity Limitations Carry;Lift;Stand;Stairs;Squat;Sleep;Locomotion Level;Transfers;Bend    Examination-Participation Restrictions Cleaning;Community Activity;Driving;Laundry;Shop    Stability/Clinical Decision Making Evolving/Moderate complexity    Rehab Potential Good    PT Frequency 2x / week   2-3   PT Duration 8 weeks    PT Treatment/Interventions ADLs/Self Care Home Management;Cryotherapy;Electrical Stimulation;Iontophoresis '4mg'$ /ml Dexamethasone;Moist Heat;Ultrasound;Gait training;Therapeutic activities;Therapeutic exercise;Neuromuscular re-education;Balance training;Manual techniques;Passive range  of motion;Dry needling;Joint Manipulations;Vasopneumatic Device;Taping    PT Next Visit Plan review and update HEP PRN, overall knee ROM, strength, gait, balance    PT Home Exercise Plan Access Code: [email or web address]    Consulted and Agree with Plan of Care Patient             Patient will  benefit from skilled therapeutic intervention in order to improve the following deficits and impairments:  Decreased activity tolerance, Decreased balance, Decreased coordination, Decreased endurance, Decreased mobility, Decreased range of motion, Decreased strength, Difficulty walking, Impaired flexibility, Increased edema, Pain  Visit Diagnosis: Chronic pain of right knee  Difficulty in walking, not elsewhere classified  Muscle weakness (generalized)  Localized edema     Problem List Patient Active Problem List   Diagnosis Date Noted   Status post total right knee replacement 10/10/2020   Vitamin D deficiency 04/24/2020   Chronic RUQ pain 01/14/2020   Hyperlipemia 08/23/2019   ICH (intracerebral hemorrhage) ([place]) L parietal w/ L SDH s/p crani 08/09/2019   Healthcare maintenance 06/20/2019   History of strabismus surgery 03/31/2019   Moderate, recurrent MDD 06/06/2018   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 [place], [place], [address] Phone: [phone]   Fax:  [phone]  Name: Michelle Carrillo MRN: [id number] Date of Birth: 19-Aug-1950

## 2020-11-07 ENCOUNTER — Other Ambulatory Visit: Payer: Self-pay

## 2020-11-07 ENCOUNTER — Encounter: Payer: Self-pay | Admitting: Rehabilitative and Restorative Service Providers"

## 2020-11-07 ENCOUNTER — Ambulatory Visit (INDEPENDENT_AMBULATORY_CARE_PROVIDER_SITE_OTHER): Payer: Medicare Other | Admitting: Rehabilitative and Restorative Service Providers"

## 2020-11-07 DIAGNOSIS — M25561 Pain in right knee: Secondary | ICD-10-CM | POA: Diagnosis not present

## 2020-11-07 DIAGNOSIS — R262 Difficulty in walking, not elsewhere classified: Secondary | ICD-10-CM

## 2020-11-07 DIAGNOSIS — G8929 Other chronic pain: Secondary | ICD-10-CM | POA: Diagnosis not present

## 2020-11-07 DIAGNOSIS — M6281 Muscle weakness (generalized): Secondary | ICD-10-CM | POA: Diagnosis not present

## 2020-11-07 DIAGNOSIS — R600 Localized edema: Secondary | ICD-10-CM

## 2020-11-07 NOTE — Patient Instructions (Signed)
Discussed with pt and daughter importance of HEP compliance and that daughter needs to be present to assist Mom performing HEP correctly; icing directions reviewed due to pt stating she ices for an hour at a time with PT advising 15-20 min max of icing at a time.

## 2020-11-07 NOTE — Therapy (Signed)
[facility] [place], [place], [address] Phone: [phone]   Fax:  [phone]  Physical Therapy Treatment  Patient Details  Name: Michelle Carrillo MRN: [id number] Date of Birth: Mar 21, 1951 Referring Provider (PT): Dollene Primrose   Encounter Date: 11/07/2020   PT End of Session - 11/07/20 1239     Visit Number 4    Number of Visits 15    Date for PT Re-Evaluation 12/22/20    PT Start Time [id number]    PT Stop Time [id number]    PT Time Calculation (min) 51 min    Activity Tolerance Patient tolerated treatment well;No increased pain    Behavior During Therapy WFL for tasks assessed/performed             Past Medical History:  Diagnosis Date   Anxiety    on meds   Arthritis    bilateral knees   Cataract    RIGHT eye   Depression    on meds   Herpes zoster kerstitis s/p corneal transplant 2015    Hypertension    on meds   Right knee osteoarthritis    Strabismus    due to eye injury as a child   Stroke ([place]) 07/2019   stroke with craniotomy    Past Surgical History:  Procedure Laterality Date   CESAREAN SECTION      x 2    CHOLECYSTECTOMY     COLONOSCOPY  04/22/2020   CORNEAL TRANSPLANT Left    2006 and 2016   CRANIOTOMY Left 08/09/2019   Procedure: CRANIOTOMY HEMATOMA EVACUATION SUBDURAL;  Surgeon: Ashok Pall, MD;  Location: [facility];  Service: Neurosurgery;  Laterality: Left;  left   EYE SURGERY     TOTAL KNEE ARTHROPLASTY Right 10/10/2020   Procedure: RIGHT TOTAL KNEE ARTHROPLASTY;  Surgeon: Leandrew Koyanagi, MD;  Location: [facility];  Service: Orthopedics;  Laterality: Right;   TUBAL LIGATION      There were no vitals filed for this visit.   Subjective Assessment - 11/07/20 1148     Subjective I didn't take the pain medication yesterday and my pain was up to an 8/10 b/c it wasn't hurting that much.    Currently in Pain? Yes    Pain Score 6     Pain Location Knee    Pain Orientation Right    Pain Descriptors /  Indicators Aching    Pain Type Surgical pain    Multiple Pain Sites No                               [facility] Adult PT Treatment/Exercise - 11/07/20 0001       Knee/Hip Exercises: Aerobic   Nustep L6 x 6 min bil UE/LE with PT discussing HEP      Knee/Hip Exercises: Standing   Other Standing Knee Exercises at parallel bar: squats x 15, L hip abdct x 15/L hip ext x 20 to increase R LE WB; R hamstring curl x 15 with pt having limited AROM; 6 inch step ups R x 15    Other Standing Knee Exercises STS from chair with arm rests with pt not using arm rests x 15; STS from low bed x 15 with pt able to perform without hand and with functional R knee flexion      Knee/Hip Exercises: Seated   Other Seated Knee/Hip Exercises LAQ x 15; Reviewed R Hamstring stretch and knee flex stretch  Knee/Hip Exercises: Supine   Other Supine Knee/Hip Exercises bridge 3x15 with pt holding pt's foot steady with pt increasing knee flexion each set; SLR x 15 with PT verbal cues for technique                      PT Short Term Goals - 11/07/20 1243       PT SHORT TERM GOAL #1   Title Pt will be Independent and compliant with initial HEP    Status On-going      PT SHORT TERM GOAL #2   Title Pt will complete FOTO assessement    Status On-going               PT Long Term Goals - 10/27/20 1026       PT LONG TERM GOAL #1   Title Patient will be independent with final progressive HEP for strengthening/balance (ALL LTGS DUE: 9/12/22_    Time 8    Period Weeks    Status New    Target Date 12/22/20      PT LONG TERM GOAL #2   Title Pt will be able to ambulate community distances mod I to independent with LRAD    Time 8    Period Weeks    Status New      PT LONG TERM GOAL #3   Title She will improve Rt knee ROM 0-115 degreees to improve funciton.    Time 8    Period Weeks    Status New      PT LONG TERM GOAL #4   Title She will improve Rt hip/knee strength to 5/5  MMT tested in sitting.    Time 8    Period Weeks    Status New      PT LONG TERM GOAL #5   Title She will have overall less than 3/10 pain with general activity.    Baseline 8    Period Weeks    Status New                   Plan - 11/07/20 1153     Clinical Impression Statement Pt continues to present to PT with confusion and needing max verbal and visual cues for techniques even with usage of interpreter. PT discussed this with the daughter who stated that with pt's stroke, she has good and bad cognitive days. Pt demonstrates knee AROM flex at 81 degrees measured supine this visit. Pt would benefit from further PT for R knee ROM and strengthening seating, standing, and supine. Pt needed maximal visual, verbal and tactile cues this visit to perform all exercises correctly.    PT Treatment/Interventions ADLs/Self Care Home Management;Cryotherapy;Electrical Stimulation;Iontophoresis '4mg'$ /ml Dexamethasone;Moist Heat;Ultrasound;Gait training;Therapeutic activities;Therapeutic exercise;Neuromuscular re-education;Balance training;Manual techniques;Passive range of motion;Dry needling;Joint Manipulations;Vasopneumatic Device;Taping    PT Next Visit Plan review HEP again as pt is not performing, overall R knee ROM/strength/gait and balance    Consulted and Agree with Plan of Care Patient;Family member/caregiver    Family Member Consulted daughter; interpreter present             Patient will benefit from skilled therapeutic intervention in order to improve the following deficits and impairments:  Decreased activity tolerance, Decreased balance, Decreased coordination, Decreased endurance, Decreased mobility, Decreased range of motion, Decreased strength, Difficulty walking, Impaired flexibility, Increased edema, Pain  Visit Diagnosis: Chronic pain of right knee  Difficulty in walking, not elsewhere classified  Muscle weakness (generalized)  Localized edema  Problem  List Patient Active Problem List   Diagnosis Date Noted   Status post total right knee replacement 10/10/2020   Vitamin D deficiency 04/24/2020   Chronic RUQ pain 01/14/2020   Hyperlipemia 08/23/2019   ICH (intracerebral hemorrhage) (HCC) L parietal w/ L SDH s/p crani 08/09/2019   Healthcare maintenance 06/20/2019   History of strabismus surgery 03/31/2019   Moderate, recurrent MDD 06/06/2018   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 [place], [place], [address] Phone: [phone]   Fax:  [phone]  Name: Michelle Carrillo MRN: [id number] Date of Birth: 1950-12-12

## 2020-11-10 ENCOUNTER — Ambulatory Visit (INDEPENDENT_AMBULATORY_CARE_PROVIDER_SITE_OTHER): Payer: Medicare Other | Admitting: Physical Therapy

## 2020-11-10 ENCOUNTER — Other Ambulatory Visit: Payer: Self-pay

## 2020-11-10 DIAGNOSIS — R262 Difficulty in walking, not elsewhere classified: Secondary | ICD-10-CM

## 2020-11-10 DIAGNOSIS — M25561 Pain in right knee: Secondary | ICD-10-CM | POA: Diagnosis not present

## 2020-11-10 DIAGNOSIS — M6281 Muscle weakness (generalized): Secondary | ICD-10-CM | POA: Diagnosis not present

## 2020-11-10 DIAGNOSIS — R600 Localized edema: Secondary | ICD-10-CM | POA: Diagnosis not present

## 2020-11-10 DIAGNOSIS — G8929 Other chronic pain: Secondary | ICD-10-CM

## 2020-11-10 NOTE — Therapy (Signed)
[facility] [place], [place], [address] Phone: [phone]   Fax:  [phone]  Physical Therapy Treatment  Patient Details  Name: Michelle Carrillo MRN: [id number] Date of Birth: [birth year] Referring Provider (PT): Dollene Primrose   Encounter Date: 11/10/2020   PT End of Session - 11/10/20 1336     Visit Number 5    Number of Visits 15    Date for PT Re-Evaluation 12/22/20    PT Start Time [id number]    PT Stop Time 1333    PT Time Calculation (min) 38 min    Activity Tolerance Patient tolerated treatment well;No increased pain    Behavior During Therapy WFL for tasks assessed/performed             Past Medical History:  Diagnosis Date   Anxiety    on meds   Arthritis    bilateral knees   Cataract    RIGHT eye   Depression    on meds   Herpes zoster kerstitis s/p corneal transplant 2015    Hypertension    on meds   Right knee osteoarthritis    Strabismus    due to eye injury as a child   Stroke ([place]) 07/2019   stroke with craniotomy    Past Surgical History:  Procedure Laterality Date   CESAREAN SECTION      x 2    CHOLECYSTECTOMY     COLONOSCOPY  04/22/2020   CORNEAL TRANSPLANT Left    2006 and 2016   CRANIOTOMY Left 08/09/2019   Procedure: CRANIOTOMY HEMATOMA EVACUATION SUBDURAL;  Surgeon: Ashok Pall, MD;  Location: [facility];  Service: Neurosurgery;  Laterality: Left;  left   EYE SURGERY     TOTAL KNEE ARTHROPLASTY Right 10/10/2020   Procedure: RIGHT TOTAL KNEE ARTHROPLASTY;  Surgeon: Leandrew Koyanagi, MD;  Location: [facility];  Service: Orthopedics;  Laterality: Right;   TUBAL LIGATION      There were no vitals filed for this visit.   Subjective Assessment - 11/10/20 1336     Subjective relays not too much pain overall, she needs encouragment at home but then she will do her exercises    Pertinent History Rt TKA,CVA    Limitations Standing;Walking;House hold activities    How long can you stand comfortably? 5  min    Patient Stated Goals reduce pain    Pain Onset 1 to 4 weeks ago              [facility] Adult PT Treatment/Exercise - 11/10/20 0001       Knee/Hip Exercises: Stretches   Knee: Self-Stretch Limitations seated knee flexion tailgate stretch 10 sec X 10      Knee/Hip Exercises: Aerobic   Recumbent Bike Rocking at first  then made full revolution, 10 min total      Knee/Hip Exercises: Machines for Strengthening   Total Gym Leg Press 75# DL X30, 50# Rt leg 2X15      Knee/Hip Exercises: Seated   Long Arc Quad Right;2 sets;15 reps    Long Arc Quad Weight 5 lbs.    Hamstring Limitations 2X15 green band    Sit to Sand 15 reps;without UE support      Knee/Hip Exercises: Supine   Straight Leg Raises Right;20 reps      Manual Therapy   Manual therapy comments Rt knee PROM to tolerance and manual H.S. stretching  PT Short Term Goals - 11/07/20 1243       PT SHORT TERM GOAL #1   Title Pt will be Independent and compliant with initial HEP    Status On-going      PT SHORT TERM GOAL #2   Title Pt will complete FOTO assessement    Status On-going               PT Long Term Goals - 10/27/20 1026       PT LONG TERM GOAL #1   Title Patient will be independent with final progressive HEP for strengthening/balance (ALL LTGS DUE: 9/12/22_    Time 8    Period Weeks    Status New    Target Date 12/22/20      PT LONG TERM GOAL #2   Title Pt will be able to ambulate community distances mod I to independent with LRAD    Time 8    Period Weeks    Status New      PT LONG TERM GOAL #3   Title She will improve Rt knee ROM 0-115 degreees to improve funciton.    Time 8    Period Weeks    Status New      PT LONG TERM GOAL #4   Title She will improve Rt hip/knee strength to 5/5 MMT tested in sitting.    Time 8    Period Weeks    Status New      PT LONG TERM GOAL #5   Title She will have overall less than 3/10 pain with general activity.     Baseline 8    Period Weeks    Status New                   Plan - 11/10/20 1337     Clinical Impression Statement She is progressing with her strength and PROM. Sometimes she does not show her full ROM duing exercises so needs cuing for this. She will continue to benefit from PT to maximize function.    PT Treatment/Interventions ADLs/Self Care Home Management;Cryotherapy;Electrical Stimulation;Iontophoresis '4mg'$ /ml Dexamethasone;Moist Heat;Ultrasound;Gait training;Therapeutic activities;Therapeutic exercise;Neuromuscular re-education;Balance training;Manual techniques;Passive range of motion;Dry needling;Joint Manipulations;Vasopneumatic Device;Taping    PT Next Visit Plan overall R knee ROM/strength/gait and balance    PT Home Exercise Plan Access Code: [email or web address]    Consulted and Agree with Plan of Care Patient;Family member/caregiver    Family Member Consulted daughter; interpreter present             Patient will benefit from skilled therapeutic intervention in order to improve the following deficits and impairments:  Decreased activity tolerance, Decreased balance, Decreased coordination, Decreased endurance, Decreased mobility, Decreased range of motion, Decreased strength, Difficulty walking, Impaired flexibility, Increased edema, Pain  Visit Diagnosis: Chronic pain of right knee  Difficulty in walking, not elsewhere classified  Muscle weakness (generalized)  Localized edema     Problem List Patient Active Problem List   Diagnosis Date Noted   Status post total right knee replacement 10/10/2020   Vitamin D deficiency 04/24/2020   Chronic RUQ pain 01/14/2020   Hyperlipemia 08/23/2019   ICH (intracerebral hemorrhage) ([place]) L parietal w/ L SDH s/p crani 08/09/2019   Healthcare maintenance 06/20/2019   History of strabismus surgery 03/31/2019   Moderate, recurrent MDD 06/06/2018   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 [place], [place], [address] Phone: [phone]   Fax:  [phone]  Name: Michelle Carrillo MRN: [id number] Date of Birth: 07-14-1950

## 2020-11-11 ENCOUNTER — Encounter: Payer: Medicare Other | Admitting: Physical Therapy

## 2020-11-14 ENCOUNTER — Ambulatory Visit (INDEPENDENT_AMBULATORY_CARE_PROVIDER_SITE_OTHER): Payer: Medicare Other | Admitting: Physical Therapy

## 2020-11-14 ENCOUNTER — Other Ambulatory Visit: Payer: Self-pay

## 2020-11-14 DIAGNOSIS — M6281 Muscle weakness (generalized): Secondary | ICD-10-CM | POA: Diagnosis not present

## 2020-11-14 DIAGNOSIS — R600 Localized edema: Secondary | ICD-10-CM | POA: Diagnosis not present

## 2020-11-14 DIAGNOSIS — G8929 Other chronic pain: Secondary | ICD-10-CM

## 2020-11-14 DIAGNOSIS — R262 Difficulty in walking, not elsewhere classified: Secondary | ICD-10-CM

## 2020-11-14 DIAGNOSIS — M25561 Pain in right knee: Secondary | ICD-10-CM

## 2020-11-14 NOTE — Therapy (Signed)
[place], [place], [address] Phone: [phone]   Fax:  [phone]  Physical Therapy Treatment  Patient Details  Name: Michelle Carrillo MRN: [id number] Date of Birth: 29-Jan-1951 Referring Provider (PT): Dollene Primrose   Encounter Date: 11/14/2020   PT End of Session - 11/14/20 1140     Visit Number 6    Number of Visits 15    Date for PT Re-Evaluation 12/22/20    PT Start Time 1100    PT Stop Time 1138    PT Time Calculation (min) 38 min    Activity Tolerance Patient tolerated treatment well;No increased pain    Behavior During Therapy WFL for tasks assessed/performed             Past Medical History:  Diagnosis Date   Anxiety    on meds   Arthritis    bilateral knees   Cataract    RIGHT eye   Depression    on meds   Herpes zoster kerstitis s/p corneal transplant 2015    Hypertension    on meds   Right knee osteoarthritis    Strabismus    due to eye injury as a child   Stroke ([place]) 07/2019   stroke with craniotomy    Past Surgical History:  Procedure Laterality Date   CESAREAN SECTION      x 2    CHOLECYSTECTOMY     COLONOSCOPY  04/22/2020   CORNEAL TRANSPLANT Left    2006 and 2016   CRANIOTOMY Left 08/09/2019   Procedure: CRANIOTOMY HEMATOMA EVACUATION SUBDURAL;  Surgeon: Ashok Pall, MD;  Location: [facility];  Service: Neurosurgery;  Laterality: Left;  left   EYE SURGERY     TOTAL KNEE ARTHROPLASTY Right 10/10/2020   Procedure: RIGHT TOTAL KNEE ARTHROPLASTY;  Surgeon: Leandrew Koyanagi, MD;  Location: [facility];  Service: Orthopedics;  Laterality: Right;   TUBAL LIGATION      There were no vitals filed for this visit.   Subjective Assessment - 11/14/20 1119     Subjective She has diffculty rating pain levels but reports her pain is only a little today.    Pertinent History Rt TKA,CVA    Limitations Standing;Walking;House hold activities    How long can you stand comfortably? 5 min    Patient  Stated Goals reduce pain    Pain Onset 1 to 4 weeks ago                [facility] PT Assessment - 11/14/20 0001       Assessment   Medical Diagnosis Rt TKA 10/10/20    Referring Provider (PT) Erlinda Hong, N    Onset Date/Surgical Date 10/10/20      PROM   Right Knee Flexion 105      Strength   Overall Strength Comments Rt hip/knee strength overall 4+/5 MMT grossly                           [facility] Adult PT Treatment/Exercise - 11/14/20 0001       Knee/Hip Exercises: Stretches   Knee: Self-Stretch Limitations seated knee flexion tailgate stretch 10 sec X 3 min      Knee/Hip Exercises: Aerobic   Recumbent Bike Rocking at first  then made full revolution, 10 min total      Knee/Hip Exercises: Machines for Strengthening   Total Gym Leg Press 75# DL X30, 50# Rt leg 2X15  Knee/Hip Exercises: Standing   Lateral Step Up Right;10 reps;Hand Hold: 2;Step Height: 6"    Forward Step Up Right;10 reps;Hand Hold: 1;Step Height: 6"      Manual Therapy   Manual therapy comments Rt knee PROM to tolerance, knee flexion mobs and manual H.S. stretching                      PT Short Term Goals - 11/07/20 1243       PT SHORT TERM GOAL #1   Title Pt will be Independent and compliant with initial HEP    Status On-going      PT SHORT TERM GOAL #2   Title Pt will complete FOTO assessement    Status On-going               PT Long Term Goals - 10/27/20 1026       PT LONG TERM GOAL #1   Title Patient will be independent with final progressive HEP for strengthening/balance (ALL LTGS DUE: 9/12/22_    Time 8    Period Weeks    Status New    Target Date 12/22/20      PT LONG TERM GOAL #2   Title Pt will be able to ambulate community distances mod I to independent with LRAD    Time 8    Period Weeks    Status New      PT LONG TERM GOAL #3   Title She will improve Rt knee ROM 0-115 degreees to improve funciton.    Time 8    Period Weeks    Status New       PT LONG TERM GOAL #4   Title She will improve Rt hip/knee strength to 5/5 MMT tested in sitting.    Time 8    Period Weeks    Status New      PT LONG TERM GOAL #5   Title She will have overall less than 3/10 pain with general activity.    Baseline 8    Period Weeks    Status New                   Plan - 11/14/20 1140     Clinical Impression Statement She showed improved ROM and strength measurments but still with mild to mod deficits in these areas and will continue to benefit from PT    Comorbidities CVA with decreased peripheral vision and memore    PT Treatment/Interventions ADLs/Self Care Home Management;Cryotherapy;Electrical Stimulation;Iontophoresis '4mg'$ /ml Dexamethasone;Moist Heat;Ultrasound;Gait training;Therapeutic activities;Therapeutic exercise;Neuromuscular re-education;Balance training;Manual techniques;Passive range of motion;Dry needling;Joint Manipulations;Vasopneumatic Device;Taping    PT Next Visit Plan overall R knee ROM/strength/gait and balance    PT Home Exercise Plan Access Code: [email or web address]    Consulted and Agree with Plan of Care Patient;Family member/caregiver    Family Member Consulted daughter; interpreter present             Patient will benefit from skilled therapeutic intervention in order to improve the following deficits and impairments:  Decreased activity tolerance, Decreased balance, Decreased coordination, Decreased endurance, Decreased mobility, Decreased range of motion, Decreased strength, Difficulty walking, Impaired flexibility, Increased edema, Pain  Visit Diagnosis: Chronic pain of right knee  Difficulty in walking, not elsewhere classified  Muscle weakness (generalized)  Localized edema     Problem List Patient Active Problem List   Diagnosis Date Noted   Status post total right knee replacement 10/10/2020   Vitamin D deficiency 04/24/2020  Chronic RUQ pain 01/14/2020   Hyperlipemia 08/23/2019   ICH  (intracerebral hemorrhage) (HCC) L parietal w/ L SDH s/p crani 08/09/2019   Healthcare maintenance 06/20/2019   History of strabismus surgery 03/31/2019   Moderate, recurrent MDD 06/06/2018   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 [place], [place], [address] Phone: [phone]   Fax:  [phone]  Name: Michelle Carrillo MRN: [id number] Date of Birth: 03/17/1951

## 2020-11-18 ENCOUNTER — Other Ambulatory Visit: Payer: Self-pay

## 2020-11-18 ENCOUNTER — Ambulatory Visit (INDEPENDENT_AMBULATORY_CARE_PROVIDER_SITE_OTHER): Payer: Medicare Other | Admitting: Physical Therapy

## 2020-11-18 DIAGNOSIS — R600 Localized edema: Secondary | ICD-10-CM

## 2020-11-18 DIAGNOSIS — R262 Difficulty in walking, not elsewhere classified: Secondary | ICD-10-CM | POA: Diagnosis not present

## 2020-11-18 DIAGNOSIS — G8929 Other chronic pain: Secondary | ICD-10-CM | POA: Diagnosis not present

## 2020-11-18 DIAGNOSIS — M25561 Pain in right knee: Secondary | ICD-10-CM | POA: Diagnosis not present

## 2020-11-18 DIAGNOSIS — M6281 Muscle weakness (generalized): Secondary | ICD-10-CM | POA: Diagnosis not present

## 2020-11-18 NOTE — Therapy (Signed)
[facility] [address] [place], [place], [address] Phone: [phone]   Fax:  [phone]  Physical Therapy Treatment/MD and MCR progress note  Progress Note reporting period date 10/27/20 to 11/18/20  See below for objective and subjective measurements relating to patients progress with PT.    Patient Details  Name: Michelle Carrillo MRN: [id number] Date of Birth: 08/10/1950 Referring Provider (PT): Dollene Primrose   Encounter Date: 11/18/2020   PT End of Session - 11/18/20 1431     Visit Number 7    Number of Visits 15    Date for PT Re-Evaluation 12/22/20    Progress Note Due on Visit 17    PT Start Time 0786    PT Stop Time 1428    PT Time Calculation (min) 45 min    Activity Tolerance Patient tolerated treatment well;No increased pain    Behavior During Therapy WFL for tasks assessed/performed             Past Medical History:  Diagnosis Date   Anxiety    on meds   Arthritis    bilateral knees   Cataract    RIGHT eye   Depression    on meds   Herpes zoster kerstitis s/p corneal transplant 2015    Hypertension    on meds   Right knee osteoarthritis    Strabismus    due to eye injury as a child   Stroke ([place]) 07/2019   stroke with craniotomy    Past Surgical History:  Procedure Laterality Date   CESAREAN SECTION      x 2    CHOLECYSTECTOMY     COLONOSCOPY  04/22/2020   CORNEAL TRANSPLANT Left    2006 and 2016   CRANIOTOMY Left 08/09/2019   Procedure: CRANIOTOMY HEMATOMA EVACUATION SUBDURAL;  Surgeon: Ashok Pall, MD;  Location: [place];  Service: Neurosurgery;  Laterality: Left;  left   EYE SURGERY     TOTAL KNEE ARTHROPLASTY Right 10/10/2020   Procedure: RIGHT TOTAL KNEE ARTHROPLASTY;  Surgeon: Leandrew Koyanagi, MD;  Location: [place];  Service: Orthopedics;  Laterality: Right;   TUBAL LIGATION      There were no vitals filed for this visit.   Subjective Assessment - 11/18/20 1411     Subjective She relays knee  stiffness but not much pain today for her Rt knee. She arrives without her cane and she states she has misplaced it somewhere in her house but she has been trying to walk more without it anyway    Pertinent History Rt TKA,CVA    Limitations Standing;Walking;House hold activities    How long can you stand comfortably? 5 min    Patient Stated Goals reduce pain    Pain Onset 1 to 4 weeks ago                [facility] PT Assessment - 11/18/20 0001       Assessment   Medical Diagnosis Rt TKA 10/10/20    Referring Provider (PT) Erlinda Hong, N    Onset Date/Surgical Date 10/10/20      PROM   Right Knee Extension -3    Right Knee Flexion 105      Strength   Overall Strength Comments Rt hip/knee strength overall 4+/5 MMT grossly                           [facility] Adult PT Treatment/Exercise - 11/18/20 0001  Knee/Hip Exercises: Stretches   Knee: Self-Stretch Limitations seated knee flexion tailgate stretch 10 sec X 3 min    Other Knee/Hip Stretches supine heelslides AAROM with Rt foot on pball 5 sec X 20      Knee/Hip Exercises: Aerobic   Recumbent Bike Rocking at first  then made full revolution, 10 min total      Knee/Hip Exercises: Machines for Strengthening   Total Gym Leg Press 81# DL X30, 50# Rt leg 2X15      Knee/Hip Exercises: Standing   Lateral Step Up Right;10 reps;Hand Hold: 2;Step Height: 6"    Forward Step Up Right;10 reps;Hand Hold: 1;Step Height: 6"    Other Standing Knee Exercises sidestepping 3 round trips at counter top no UE support      Knee/Hip Exercises: Seated   Long Arc Quad Right;2 sets;15 reps    Long Arc Quad Weight 5 lbs.      Manual Therapy   Manual therapy comments Rt knee PROM to tolerance, knee flexion mobs and manual H.S. stretching                      PT Short Term Goals - 11/18/20 1433       PT SHORT TERM GOAL #1   Title Pt will be Independent and compliant with initial HEP    Status On-going      PT SHORT TERM  GOAL #2   Title Pt will complete FOTO assessement    Baseline DC this goal, has cognitive deficits making it too difficult for this assessment    Status Unable to assess               PT Long Term Goals - 11/18/20 1437       PT LONG TERM GOAL #1   Title Patient will be independent with final progressive HEP for strengthening/balance (ALL LTGS DUE: 9/12/22_    Time 8    Period Weeks    Status On-going      PT LONG TERM GOAL #2   Title Pt will be able to ambulate community distances mod I to independent with LRAD    Baseline now met with cane, becoming independent without AD for short distances    Time 8    Period Weeks    Status Achieved      PT LONG TERM GOAL #3   Title She will improve Rt knee ROM 0-115 degreees to improve funciton.    Baseline 3-105 now    Time 8    Period Weeks    Status On-going      PT LONG TERM GOAL #4   Title She will improve Rt hip/knee strength to 5/5 MMT tested in sitting.    Baseline 4+ now    Time 8    Period Weeks    Status On-going      PT LONG TERM GOAL #5   Title She will have overall less than 3/10 pain with general activity.    Baseline 3-5 now    Period Weeks    Status On-going                   Plan - 11/18/20 1431     Clinical Impression Statement she is progressing well but still lacking some knee flexion ROM. PT will continue to work to maximize her ROM, strength and funciton in her Rt knee. She is overall doing well with pain and will follow up with MD on  Friday before PT.    Comorbidities CVA with decreased peripheral vision and memore    PT Treatment/Interventions ADLs/Self Care Home Management;Cryotherapy;Electrical Stimulation;Iontophoresis 63m/ml Dexamethasone;Moist Heat;Ultrasound;Gait training;Therapeutic activities;Therapeutic exercise;Neuromuscular re-education;Balance training;Manual techniques;Passive range of motion;Dry needling;Joint Manipulations;Vasopneumatic Device;Taping    PT Next Visit Plan  what did MD say? overall R knee ROM/strength/gait and balance    PT Home Exercise Plan Access Code: [email or web address]    Consulted and Agree with Plan of Care Patient;Family member/caregiver    Family Member Consulted daughter; interpreter present             Patient will benefit from skilled therapeutic intervention in order to improve the following deficits and impairments:  Decreased activity tolerance, Decreased balance, Decreased coordination, Decreased endurance, Decreased mobility, Decreased range of motion, Decreased strength, Difficulty walking, Impaired flexibility, Increased edema, Pain  Visit Diagnosis: Chronic pain of right knee  Difficulty in walking, not elsewhere classified  Muscle weakness (generalized)  Localized edema     Problem List Patient Active Problem List   Diagnosis Date Noted   Status post total right knee replacement 10/10/2020   Vitamin D deficiency 04/24/2020   Chronic RUQ pain 01/14/2020   Hyperlipemia 08/23/2019   ICH (intracerebral hemorrhage) ([facility] L parietal w/ L SDH s/p crani 08/09/2019   Healthcare maintenance 06/20/2019   History of strabismus surgery 03/31/2019   Moderate, recurrent MDD 06/06/2018   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

## 2020-11-19 ENCOUNTER — Other Ambulatory Visit: Payer: Self-pay | Admitting: Orthopaedic Surgery

## 2020-11-19 ENCOUNTER — Other Ambulatory Visit: Payer: Self-pay | Admitting: Physician Assistant

## 2020-11-19 MED ORDER — HYDROCODONE-ACETAMINOPHEN 5-325 MG PO TABS: 1.0000 | ORAL_TABLET | Freq: Three times a day (TID) | ORAL | 0 refills | Status: DC | PRN

## 2020-11-19 NOTE — Telephone Encounter (Signed)
Norco Rx Already sent in

## 2020-11-19 NOTE — Telephone Encounter (Signed)
Weaning to norco and I just sent in

## 2020-11-21 ENCOUNTER — Encounter: Payer: Self-pay | Admitting: Rehabilitative and Restorative Service Providers"

## 2020-11-21 ENCOUNTER — Other Ambulatory Visit: Payer: Self-pay

## 2020-11-21 ENCOUNTER — Encounter: Payer: Self-pay | Admitting: Orthopaedic Surgery

## 2020-11-21 ENCOUNTER — Ambulatory Visit (INDEPENDENT_AMBULATORY_CARE_PROVIDER_SITE_OTHER): Payer: Medicare Other | Admitting: Orthopaedic Surgery

## 2020-11-21 ENCOUNTER — Telehealth: Payer: Self-pay | Admitting: *Deleted

## 2020-11-21 ENCOUNTER — Ambulatory Visit (INDEPENDENT_AMBULATORY_CARE_PROVIDER_SITE_OTHER): Payer: Medicare Other | Admitting: Rehabilitative and Restorative Service Providers"

## 2020-11-21 ENCOUNTER — Encounter: Payer: Medicare Other | Admitting: Rehabilitative and Restorative Service Providers"

## 2020-11-21 ENCOUNTER — Ambulatory Visit (INDEPENDENT_AMBULATORY_CARE_PROVIDER_SITE_OTHER): Payer: Medicare Other

## 2020-11-21 VITALS — Ht 60.0 in | Wt 164.0 lb

## 2020-11-21 DIAGNOSIS — R600 Localized edema: Secondary | ICD-10-CM | POA: Diagnosis not present

## 2020-11-21 DIAGNOSIS — Z96651 Presence of right artificial knee joint: Secondary | ICD-10-CM | POA: Diagnosis not present

## 2020-11-21 DIAGNOSIS — G8929 Other chronic pain: Secondary | ICD-10-CM | POA: Diagnosis not present

## 2020-11-21 DIAGNOSIS — M25561 Pain in right knee: Secondary | ICD-10-CM

## 2020-11-21 DIAGNOSIS — R262 Difficulty in walking, not elsewhere classified: Secondary | ICD-10-CM

## 2020-11-21 DIAGNOSIS — M6281 Muscle weakness (generalized): Secondary | ICD-10-CM | POA: Diagnosis not present

## 2020-11-21 MED ORDER — IBUPROFEN 800 MG PO TABS: 800.0000 mg | ORAL_TABLET | Freq: Three times a day (TID) | ORAL | 2 refills | Status: DC | PRN

## 2020-11-21 MED ORDER — HYDROCODONE-ACETAMINOPHEN 5-325 MG PO TABS: 1.0000 | ORAL_TABLET | Freq: Every day | ORAL | 0 refills | Status: DC | PRN

## 2020-11-21 NOTE — Progress Notes (Signed)
Post-Op Visit Note   Patient: Michelle Carrillo           Date of Birth: [birth year]           MRN: [id number] Visit Date: 11/21/2020 PCP: Orvis Brill, MD   Assessment & Plan:  Chief Complaint:  Chief Complaint  Patient presents with   Right Knee - Follow-up    Right total knee arthroplasty 10/10/2020   Visit Diagnoses:  1. Status post total right knee replacement     Plan: [name] is 6 weeks status post right total knee she is better but she is still experiencing pain in the front of the knee.  She is out of opiate pain medications and currently taking Tylenol for the pain.  Doing home exercises.  Right knee shows a fully healed surgical scar.  Range of motion is 0-90 limited by pain and swelling.  No joint effusion.  Collaterals are stable.  X-rays unremarkable.  Patient is progressing well from the knee replacement.  I explained that I would like her flexion to be a little bit better but I feel that this is limited by the swelling.  She has been very compliant with physical therapy and home exercises and I am confident that she will continue to make improvement.  Dental prophylaxis reinforced.  Recheck in 6 weeks.  Follow-Up Instructions: Return in about 4 weeks (around 12/19/2020).   Orders:  Orders Placed This Encounter  Procedures   XR Knee 1-2 Views Right   Meds ordered this encounter  Medications   ibuprofen (ADVIL) 800 MG tablet    Sig: Take 1 tablet (800 mg total) by mouth every 8 (eight) hours as needed.    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 06/06/2018   Primary osteoarthritis of right knee 06/05/2018   Essential hypertension 06/05/2018   Anxiety 06/05/2018   Age-related nuclear cataract of both eyes 11/26/2014   Diplopia 11/26/2014   Monocular esotropia of left eye 11/26/2014   Past Medical History:  Diagnosis Date   Anxiety    on meds   Arthritis    bilateral knees   Cataract    RIGHT eye   Depression    on meds   Herpes zoster kerstitis s/p corneal transplant 2015    Hypertension    on meds   Right knee osteoarthritis    Strabismus    due to eye injury as a child   Stroke ([place]) 07/2019   stroke with craniotomy    Family History  Problem Relation Age of Onset   Diabetes Mother    Ovarian cancer Mother    High blood pressure Sister    Diabetes Sister    Diabetes Brother    Stomach cancer Maternal Grandmother 98   Colon polyps Neg Hx    Colon cancer Neg Hx    Rectal cancer Neg Hx  Esophageal cancer Neg Hx     Past Surgical History:  Procedure Laterality Date   CESAREAN SECTION      x 2    CHOLECYSTECTOMY     COLONOSCOPY  04/22/2020   CORNEAL TRANSPLANT Left    2006 and 2016   CRANIOTOMY Left 08/09/2019   Procedure: CRANIOTOMY HEMATOMA EVACUATION SUBDURAL;  Surgeon: Ashok Pall, MD;  Location: [place];  Service: Neurosurgery;  Laterality: Left;  left   EYE SURGERY     TOTAL KNEE ARTHROPLASTY Right 10/10/2020   Procedure: RIGHT TOTAL KNEE ARTHROPLASTY;  Surgeon: Leandrew Koyanagi, MD;  Location: [place];  Service: Orthopedics;  Laterality: Right;   TUBAL LIGATION     Social History   Occupational History   Not on file  Tobacco Use   Smoking status: Never   Smokeless tobacco: Never  Vaping Use   Vaping Use: Never used  Substance and Sexual Activity   Alcohol use: Never   Drug use: Never   Sexual activity: Not on file

## 2020-11-21 NOTE — Telephone Encounter (Signed)
Ortho bundle 30 day call completed with survey.

## 2020-11-21 NOTE — Patient Instructions (Signed)
Focus on flexion AROM and quadriceps strength with current HEP.

## 2020-11-21 NOTE — Therapy (Signed)
[facility] [place], [place], [address] Phone: [phone]   Fax:  [phone]  Physical Therapy Treatment  Patient Details  Name: Michelle Carrillo MRN: [id number] Date of Birth: [birth year] Referring Provider (PT): Dollene Primrose   Encounter Date: 11/21/2020   PT End of Session - 11/21/20 1325     Visit Number 8    Number of Visits 15    Date for PT Re-Evaluation 12/22/20    Authorization Type MCR    Progress Note Due on Visit 17    PT Start Time 1027    PT Stop Time 1145    PT Time Calculation (min) 24 min    Activity Tolerance Patient tolerated treatment well;No increased pain    Behavior During Therapy WFL for tasks assessed/performed             Past Medical History:  Diagnosis Date   Anxiety    on meds   Arthritis    bilateral knees   Cataract    RIGHT eye   Depression    on meds   Herpes zoster kerstitis s/p corneal transplant 2015    Hypertension    on meds   Right knee osteoarthritis    Strabismus    due to eye injury as a child   Stroke ([place]) 07/2019   stroke with craniotomy    Past Surgical History:  Procedure Laterality Date   CESAREAN SECTION      x 2    CHOLECYSTECTOMY     COLONOSCOPY  04/22/2020   CORNEAL TRANSPLANT Left    2006 and 2016   CRANIOTOMY Left 08/09/2019   Procedure: CRANIOTOMY HEMATOMA EVACUATION SUBDURAL;  Surgeon: Ashok Pall, MD;  Location: [place];  Service: Neurosurgery;  Laterality: Left;  left   EYE SURGERY     TOTAL KNEE ARTHROPLASTY Right 10/10/2020   Procedure: RIGHT TOTAL KNEE ARTHROPLASTY;  Surgeon: Leandrew Koyanagi, MD;  Location: [place];  Service: Orthopedics;  Laterality: Right;   TUBAL LIGATION      There were no vitals filed for this visit.   Subjective Assessment - 11/21/20 1322     Subjective [name] was late to her appointment due to running over with Dr. Erlinda Hong.  She reports wanting to work harder on her flexion AROM and strength.    Pertinent History Rt TKA,CVA     Limitations Standing;Walking;House hold activities    How long can you stand comfortably? 5 min    Patient Stated Goals reduce pain    Currently in Pain? Yes    Pain Score 5     Pain Location Knee    Pain Orientation Right    Pain Descriptors / Indicators Aching;Tightness    Pain Type Surgical pain    Pain Radiating Towards NA    Pain Onset More than a month ago    Pain Frequency Intermittent    Aggravating Factors  Too much WB and end range stretching    Pain Relieving Factors Ice and rest    Effect of Pain on Daily Activities Edema with prolonged WB, limited flexion AROM and poor WB endurance    Multiple Pain Sites No                               [facility] Adult PT Treatment/Exercise - 11/21/20 0001       Exercises   Exercises Knee/Hip      Knee/Hip Exercises: Stretches  Knee: Self-Stretch Limitations Tailgate knee flexion for 2 minutes followed by 10X 10 seconds L overpressure to R knee flexion      Knee/Hip Exercises: Aerobic   Recumbent Bike Seat 2 for 8 minutes AAROM to full revolutions      Knee/Hip Exercises: Machines for Strengthening   Total Gym Leg Press 81# DL slow eccentrics with push into full extension and stretch into flexion 25X                    PT Education - 11/21/20 1325     Education Details Reinforced emphasis on flexion AROM and quadriceps strength with Michelle Carrillo HEP.    Person(s) Educated Patient    Methods Explanation;Demonstration;Verbal cues    Comprehension Returned demonstration;Need further instruction;Verbal cues required;Verbalized understanding              PT Short Term Goals - 11/18/20 1433       PT SHORT TERM GOAL #1   Title Pt will be Independent and compliant with initial HEP    Status On-going      PT SHORT TERM GOAL #2   Title Pt will complete FOTO assessement    Baseline DC this goal, has cognitive deficits making it too difficult for this assessment    Status Unable to assess                PT Long Term Goals - 11/18/20 1437       PT LONG TERM GOAL #1   Title Patient will be independent with final progressive HEP for strengthening/balance (ALL LTGS DUE: 9/12/22_    Time 8    Period Weeks    Status On-going      PT LONG TERM GOAL #2   Title Pt will be able to ambulate community distances mod I to independent with LRAD    Baseline now met with cane, becoming independent without AD for short distances    Time 8    Period Weeks    Status Achieved      PT LONG TERM GOAL #3   Title She will improve Rt knee ROM 0-115 degreees to improve funciton.    Baseline 3-105 now    Time 8    Period Weeks    Status On-going      PT LONG TERM GOAL #4   Title She will improve Rt hip/knee strength to 5/5 MMT tested in sitting.    Baseline 4+ now    Time 8    Period Weeks    Status On-going      PT LONG TERM GOAL #5   Title She will have overall less than 3/10 pain with general activity.    Baseline 3-5 now    Period Weeks    Status On-going                   Plan - 11/21/20 1326     Clinical Impression Statement Michelle Carrillo visit was shorter today due to her running late with Dr. Phoebe Sharps appointment.  Flexion AROM and quadriceps strength remain a high priority to improve range, control edema and improve WB function.    Personal Factors and Comorbidities Comorbidity 1    Comorbidities CVA with decreased peripheral vision and memore    Examination-Activity Limitations Carry;Lift;Stand;Stairs;Squat;Sleep;Locomotion Level;Transfers;Bend    Examination-Participation Restrictions Cleaning;Community Activity;Driving;Laundry;Shop    Rehab Potential Good    PT Frequency 2x / week    PT Duration 8 weeks    PT Treatment/Interventions  ADLs/Self Care Home Management;Cryotherapy;Electrical Stimulation;Iontophoresis 27m/ml Dexamethasone;Moist Heat;Ultrasound;Gait training;Therapeutic activities;Therapeutic exercise;Neuromuscular re-education;Balance training;Manual  techniques;Passive range of motion;Dry needling;Joint Manipulations;Vasopneumatic Device;Taping    PT Next Visit Plan Knee flexion AROM, quadriceps strength, balance and gait specific activities PRN    PT Home Exercise Plan Access Code: [email or web address]    Consulted and Agree with Plan of Care Patient             Patient will benefit from skilled therapeutic intervention in order to improve the following deficits and impairments:  Decreased activity tolerance, Decreased balance, Decreased coordination, Decreased endurance, Decreased mobility, Decreased range of motion, Decreased strength, Difficulty walking, Impaired flexibility, Increased edema, Pain  Visit Diagnosis: Difficulty in walking, not elsewhere classified  Muscle weakness (generalized)  Localized edema  Chronic pain of right knee     Problem List Patient Active Problem List   Diagnosis Date Noted   Status post total right knee replacement 10/10/2020   Vitamin D deficiency 04/24/2020   Chronic RUQ pain 01/14/2020   Hyperlipemia 08/23/2019   ICH ([place]rebral hemorrhage) ([place] parietal w/ L SDH s/p crani 08/09/2019   Healthcare maintenance 06/20/2019   History of strabismus surgery 03/31/2019   Moderate, recurrent MDD 06/06/2018   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

## 2020-11-24 ENCOUNTER — Encounter: Payer: Medicare Other | Admitting: Rehabilitative and Restorative Service Providers"

## 2020-11-26 ENCOUNTER — Ambulatory Visit (INDEPENDENT_AMBULATORY_CARE_PROVIDER_SITE_OTHER): Payer: Medicare Other | Admitting: Physical Therapy

## 2020-11-26 ENCOUNTER — Encounter: Payer: Self-pay | Admitting: Physical Therapy

## 2020-11-26 ENCOUNTER — Other Ambulatory Visit: Payer: Self-pay

## 2020-11-26 DIAGNOSIS — M25561 Pain in right knee: Secondary | ICD-10-CM

## 2020-11-26 DIAGNOSIS — G8929 Other chronic pain: Secondary | ICD-10-CM | POA: Diagnosis not present

## 2020-11-26 DIAGNOSIS — R600 Localized edema: Secondary | ICD-10-CM | POA: Diagnosis not present

## 2020-11-26 DIAGNOSIS — M6281 Muscle weakness (generalized): Secondary | ICD-10-CM

## 2020-11-26 DIAGNOSIS — R262 Difficulty in walking, not elsewhere classified: Secondary | ICD-10-CM | POA: Diagnosis not present

## 2020-11-26 NOTE — Therapy (Signed)
[place], [place], [address] Phone: [phone]   Fax:  [phone]  Physical Therapy Treatment  Patient Details  Name: Michelle Carrillo MRN: [id number] Date of Birth: 03/06/1951 Referring Provider (PT): Dollene Primrose   Encounter Date: 11/26/2020   PT End of Session - 11/26/20 1150     Visit Number 9    Number of Visits 15    Date for PT Re-Evaluation 12/22/20    Authorization Type MCR    PT Start Time 1151    PT Stop Time 1231    PT Time Calculation (min) 40 min    Activity Tolerance Patient tolerated treatment well;No increased pain    Behavior During Therapy WFL for tasks assessed/performed             Past Medical History:  Diagnosis Date   Anxiety    on meds   Arthritis    bilateral knees   Cataract    RIGHT eye   Depression    on meds   Herpes zoster kerstitis s/p corneal transplant 2015    Hypertension    on meds   Right knee osteoarthritis    Strabismus    due to eye injury as a child   Stroke ([place]) 07/2019   stroke with craniotomy    Past Surgical History:  Procedure Laterality Date   CESAREAN SECTION      x 2    CHOLECYSTECTOMY     COLONOSCOPY  04/22/2020   CORNEAL TRANSPLANT Left    2006 and 2016   CRANIOTOMY Left 08/09/2019   Procedure: CRANIOTOMY HEMATOMA EVACUATION SUBDURAL;  Surgeon: Ashok Pall, MD;  Location: [facility];  Service: Neurosurgery;  Laterality: Left;  left   EYE SURGERY     TOTAL KNEE ARTHROPLASTY Right 10/10/2020   Procedure: RIGHT TOTAL KNEE ARTHROPLASTY;  Surgeon: Leandrew Koyanagi, MD;  Location: [facility];  Service: Orthopedics;  Laterality: Right;   TUBAL LIGATION      There were no vitals filed for this visit.   Subjective Assessment - 11/26/20 1155     Subjective Michelle Carrillo was late to her appointment. She reports 7/10 pain today.    Pertinent History Rt TKA,CVA    Limitations Standing;Walking;House hold activities    Patient Stated Goals reduce pain    Currently in  Pain? Yes    Pain Score 7     Pain Location Knee    Pain Orientation Right    Pain Descriptors / Indicators Aching;Tightness    Pain Type Surgical pain                               [facility] Adult PT Treatment/Exercise - 11/26/20 0001       Knee/Hip Exercises: Stretches   Sports administrator Right;2 reps;60 seconds    Quad Stretch Limitations in prone with strap      Knee/Hip Exercises: Aerobic   Recumbent Bike partial revolutions seat 2 x 8 min      Knee/Hip Exercises: Machines for Strengthening   Total Gym Leg Press 106# x 20, prolonged stretch at end      Knee/Hip Exercises: Supine   Heel Slides 1 set;5 reps      Knee/Hip Exercises: Sidelying   Hip ABduction Right;20 reps      Knee/Hip Exercises: Prone   Hip Extension Right;20 reps      Manual Therapy   Manual therapy comments Rt knee PROM into  flexion                    PT Education - 11/26/20 1235     Education Details HEP updated    Person(s) Educated Patient    Methods Explanation;Demonstration;Handout    Comprehension Verbalized understanding;Returned demonstration              PT Short Term Goals - 11/18/20 1433       PT SHORT TERM GOAL #1   Title Pt will be Independent and compliant with initial HEP    Status On-going      PT SHORT TERM GOAL #2   Title Pt will complete FOTO assessement    Baseline DC this goal, has cognitive deficits making it too difficult for this assessment    Status Unable to assess               PT Long Term Goals - 11/18/20 1437       PT LONG TERM GOAL #1   Title Patient will be independent with final progressive HEP for strengthening/balance (ALL LTGS DUE: 9/12/22_    Time 8    Period Weeks    Status On-going      PT LONG TERM GOAL #2   Title Pt will be able to ambulate community distances mod I to independent with LRAD    Baseline now met with cane, becoming independent without AD for short distances    Time 8    Period Weeks     Status Achieved      PT LONG TERM GOAL #3   Title She will improve Rt knee ROM 0-115 degreees to improve funciton.    Baseline 3-105 now    Time 8    Period Weeks    Status On-going      PT LONG TERM GOAL #4   Title She will improve Rt hip/knee strength to 5/5 MMT tested in sitting.    Baseline 4+ now    Time 8    Period Weeks    Status On-going      PT LONG TERM GOAL #5   Title She will have overall less than 3/10 pain with general activity.    Baseline 3-5 now    Period Weeks    Status On-going                   Plan - 11/26/20 1233     Clinical Impression Statement Michelle Carrillo had increased tightness on bike today and was not able to make full revolutions. She is limited by quad tightness in prone so this was issued for HEP.    PT Treatment/Interventions ADLs/Self Care Home Management;Cryotherapy;Electrical Stimulation;Iontophoresis 49m/ml Dexamethasone;Moist Heat;Ultrasound;Gait training;Therapeutic activities;Therapeutic exercise;Neuromuscular re-education;Balance training;Manual techniques;Passive range of motion;Dry needling;Joint Manipulations;Vasopneumatic Device;Taping    PT Next Visit Plan Knee flexion AROM, quadriceps strength, balance and gait specific activities PRN    PT Home Exercise Plan Access Code: [email or web address]             Patient will benefit from skilled therapeutic intervention in order to improve the following deficits and impairments:  Decreased activity tolerance, Decreased balance, Decreased coordination, Decreased endurance, Decreased mobility, Decreased range of motion, Decreased strength, Difficulty walking, Impaired flexibility, Increased edema, Pain  Visit Diagnosis: Muscle weakness (generalized)  Difficulty in walking, not elsewhere classified  Localized edema  Chronic pain of right knee     Problem List Patient Active Problem List   Diagnosis Date Noted   Status post total  right knee replacement 10/10/2020   Vitamin D deficiency  04/24/2020   Chronic RUQ pain 01/14/2020   Hyperlipemia 08/23/2019   ICH (intracerebral hemorrhage) (HCC) L parietal w/ L SDH s/p crani 08/09/2019   Healthcare maintenance 06/20/2019   History of strabismus surgery 03/31/2019   Moderate, recurrent MDD 06/06/2018   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 [address] [place], [place], [address] Phone: [phone]   Fax:  [phone]  Name: TERRIE HARING MRN: [id number] Date of Birth: 03-Dec-1950

## 2020-11-26 NOTE — Patient Instructions (Signed)
Access Code: [email or web address] URL: [email or web address] Date: 11/26/2020 Prepared by: Almyra Free  Exercises Seated Hamstring Stretch - 2 x daily - 6 x weekly - 1 sets - 2 reps - 30 hold Seated Knee Flexion Stretch - 2 x daily - 6 x weekly - 1-2 sets - 10 reps - 5 sec hold Supine Active Straight Leg Raise - 2 x daily - 6 x weekly - 1-3 sets - 10 reps Seated Long Arc Quad - 2 x daily - 6 x weekly - 2-3 sets - 10 reps - 3 hold Supine Knee Extension Mobilization with Weight - 2 x daily - 6 x weekly - 1 sets - 3-5 min hold Standing Hip Abduction - 2 x daily - 6 x weekly - 2-3 sets - 10 reps Mini Squat with Counter Support - 2 x daily - 6 x weekly - 1-2 sets - 10 reps Prone Quadriceps Stretch with Strap - 2-3 x daily - 7 x weekly - 1 sets - 3 reps - 60 sec hold

## 2020-12-02 ENCOUNTER — Ambulatory Visit (INDEPENDENT_AMBULATORY_CARE_PROVIDER_SITE_OTHER): Payer: Medicare Other | Admitting: Physical Therapy

## 2020-12-02 ENCOUNTER — Other Ambulatory Visit: Payer: Self-pay

## 2020-12-02 ENCOUNTER — Encounter: Payer: Self-pay | Admitting: Physical Therapy

## 2020-12-02 DIAGNOSIS — G8929 Other chronic pain: Secondary | ICD-10-CM | POA: Diagnosis not present

## 2020-12-02 DIAGNOSIS — R262 Difficulty in walking, not elsewhere classified: Secondary | ICD-10-CM

## 2020-12-02 DIAGNOSIS — M6281 Muscle weakness (generalized): Secondary | ICD-10-CM | POA: Diagnosis not present

## 2020-12-02 DIAGNOSIS — M25561 Pain in right knee: Secondary | ICD-10-CM | POA: Diagnosis not present

## 2020-12-02 NOTE — Therapy (Signed)
[place], [place], [address] Phone: [phone]   Fax:  [phone]  Physical Therapy Treatment  Patient Details  Name: Michelle Carrillo MRN: [id number] Date of Birth: 25-Oct-1950 Referring Provider (PT): Dollene Primrose   Encounter Date: 12/02/2020   PT End of Session - 12/02/20 1118     Visit Number 10    Number of Visits 15    Date for PT Re-Evaluation 12/22/20    Authorization Type MCR    PT Start Time 1102    PT Stop Time 1140    PT Time Calculation (min) 38 min    Activity Tolerance Patient tolerated treatment well;No increased pain    Behavior During Therapy WFL for tasks assessed/performed             Past Medical History:  Diagnosis Date   Anxiety    on meds   Arthritis    bilateral knees   Cataract    RIGHT eye   Depression    on meds   Herpes zoster kerstitis s/p corneal transplant 2015    Hypertension    on meds   Right knee osteoarthritis    Strabismus    due to eye injury as a child   Stroke ([place]) 07/2019   stroke with craniotomy    Past Surgical History:  Procedure Laterality Date   CESAREAN SECTION      x 2    CHOLECYSTECTOMY     COLONOSCOPY  04/22/2020   CORNEAL TRANSPLANT Left    2006 and 2016   CRANIOTOMY Left 08/09/2019   Procedure: CRANIOTOMY HEMATOMA EVACUATION SUBDURAL;  Surgeon: Ashok Pall, MD;  Location: [facility];  Service: Neurosurgery;  Laterality: Left;  left   EYE SURGERY     TOTAL KNEE ARTHROPLASTY Right 10/10/2020   Procedure: RIGHT TOTAL KNEE ARTHROPLASTY;  Surgeon: Leandrew Koyanagi, MD;  Location: [facility];  Service: Orthopedics;  Laterality: Right;   TUBAL LIGATION      There were no vitals filed for this visit.   Subjective Assessment - 12/02/20 1108     Subjective Pt states the knee is feeling okay. Pt states she has been compliant with HEP. She feels the knee bending feels a little better.    Pertinent History Rt TKA,CVA    Limitations Standing;Walking;House  hold activities    Patient Stated Goals reduce pain    Currently in Pain? Yes    Pain Score 2     Pain Location Knee    Pain Orientation Right    Pain Descriptors / Indicators Aching;Tightness                               [facility] Adult PT Treatment/Exercise - 12/02/20 0001       Knee/Hip Exercises: Stretches   Quad Stretch Right   3x 30s   Quad Stretch Limitations in prone with strap    Other Knee/Hip Stretches seated tailgate stretch short sitting 10s 10x      Knee/Hip Exercises: Aerobic   Recumbent Bike partial to full revolutions seat 2 x 8 min      Knee/Hip Exercises: Machines for Strengthening         Knee/Hip Exercises: Standing   Other Standing Knee Exercises STS 3x10 GTB at knees    Other Standing Knee Exercises HR 20x      Knee/Hip Exercises: Supine       Bridges Limitations 2x10  with green band at knees                       Manual Therapy   Manual therapy comments Rt knee PROM into flexion, posterior tibia glide in flexion grade III                      PT Short Term Goals - 11/18/20 1433       PT SHORT TERM GOAL #1   Title Pt will be Independent and compliant with initial HEP    Status On-going      PT SHORT TERM GOAL #2   Title Pt will complete FOTO assessement    Baseline DC this goal, has cognitive deficits making it too difficult for this assessment    Status Unable to assess               PT Long Term Goals - 11/18/20 1437       PT LONG TERM GOAL #1   Title Patient will be independent with final progressive HEP for strengthening/balance (ALL LTGS DUE: 9/12/22_    Time 8    Period Weeks    Status On-going      PT LONG TERM GOAL #2   Title Pt will be able to ambulate community distances mod I to independent with LRAD    Baseline now met with cane, becoming independent without AD for short distances    Time 8    Period Weeks    Status Achieved      PT LONG TERM GOAL #3   Title She will  improve Rt knee ROM 0-115 degreees to improve funciton.    Baseline 3-105 now    Time 8    Period Weeks    Status On-going      PT LONG TERM GOAL #4   Title She will improve Rt hip/knee strength to 5/5 MMT tested in sitting.    Baseline 4+ now    Time 8    Period Weeks    Status On-going      PT LONG TERM GOAL #5   Title She will have overall less than 3/10 pain with general activity.    Baseline 3-5 now    Period Weeks    Status On-going                   Plan - 12/02/20 1121     Clinical Impression Statement Pt requiring increased VC and TC at today's session for exercise set up and technique. Pt able to improve knee flexion to 111 from 95 deg at beginning of session. Pt still lacks full TKE and shows apprehension with STS transfers. Pt requiring max cuing for set up with HEP review. Pt would benefit from continued skilled therapy in order to reach goals and maximize functional R LE strength and ROM for full return to PLOF.    Personal Factors and Comorbidities Comorbidity 1    Comorbidities CVA with decreased peripheral vision and memore    Examination-Activity Limitations Carry;Lift;Stand;Stairs;Squat;Sleep;Locomotion Level;Transfers;Bend    Examination-Participation Restrictions Cleaning;Community Activity;Driving;Laundry;Shop    PT Treatment/Interventions ADLs/Self Care Home Management;Cryotherapy;Electrical Stimulation;Iontophoresis 24m/ml Dexamethasone;Moist Heat;Ultrasound;Gait training;Therapeutic activities;Therapeutic exercise;Neuromuscular re-education;Balance training;Manual techniques;Passive range of motion;Dry needling;Joint Manipulations;Vasopneumatic Device;Taping    PT Next Visit Plan Knee flexion AROM, quadriceps strength, balance and gait specific activities PRN    PT Home Exercise Plan Access Code: [email or web address]  Patient will benefit from skilled therapeutic intervention in order to improve the following deficits and impairments:  Decreased  activity tolerance, Decreased balance, Decreased coordination, Decreased endurance, Decreased mobility, Decreased range of motion, Decreased strength, Difficulty walking, Impaired flexibility, Increased edema, Pain  Visit Diagnosis: Difficulty in walking, not elsewhere classified  Chronic pain of right knee  Muscle weakness (generalized)     Problem List Patient Active Problem List   Diagnosis Date Noted   Status post total right knee replacement 10/10/2020   Vitamin D deficiency 04/24/2020   Chronic RUQ pain 01/14/2020   Hyperlipemia 08/23/2019   ICH (intracerebral hemorrhage) ([place]) L parietal w/ L SDH s/p crani 08/09/2019   Healthcare maintenance 06/20/2019   History of strabismus surgery 03/31/2019   Moderate, recurrent MDD 06/06/2018   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 [place], [place], [address] Phone: [phone]   Fax:  [phone]  Name: Michelle Carrillo MRN: [id number] Date of Birth: [birth year]

## 2020-12-04 ENCOUNTER — Encounter: Payer: Self-pay | Admitting: Physical Therapy

## 2020-12-04 ENCOUNTER — Other Ambulatory Visit: Payer: Self-pay

## 2020-12-04 ENCOUNTER — Ambulatory Visit (INDEPENDENT_AMBULATORY_CARE_PROVIDER_SITE_OTHER): Payer: Medicare Other | Admitting: Physical Therapy

## 2020-12-04 DIAGNOSIS — R262 Difficulty in walking, not elsewhere classified: Secondary | ICD-10-CM | POA: Diagnosis not present

## 2020-12-04 DIAGNOSIS — G8929 Other chronic pain: Secondary | ICD-10-CM | POA: Diagnosis not present

## 2020-12-04 DIAGNOSIS — M25561 Pain in right knee: Secondary | ICD-10-CM

## 2020-12-04 DIAGNOSIS — R600 Localized edema: Secondary | ICD-10-CM

## 2020-12-04 DIAGNOSIS — M6281 Muscle weakness (generalized): Secondary | ICD-10-CM

## 2020-12-04 NOTE — Therapy (Signed)
[place], [place], [address] Phone: [phone]   Fax:  [phone]  Physical Therapy Treatment  Patient Details  Name: Michelle Carrillo MRN: [id number] Date of Birth: May 16, 1950 Referring Provider (PT): Dollene Primrose   Encounter Date: 12/04/2020   PT End of Session - 12/04/20 1110     Visit Number 11    Number of Visits 15    Date for PT Re-Evaluation 12/22/20    Authorization Type MCR    Progress Note Due on Visit 17    PT Start Time 1106    PT Stop Time 1148    PT Time Calculation (min) 42 min    Activity Tolerance Patient tolerated treatment well;No increased pain    Behavior During Therapy WFL for tasks assessed/performed             Past Medical History:  Diagnosis Date   Anxiety    on meds   Arthritis    bilateral knees   Cataract    RIGHT eye   Depression    on meds   Herpes zoster kerstitis s/p corneal transplant 2015    Hypertension    on meds   Right knee osteoarthritis    Strabismus    due to eye injury as a child   Stroke ([place]) 07/2019   stroke with craniotomy    Past Surgical History:  Procedure Laterality Date   CESAREAN SECTION      x 2    CHOLECYSTECTOMY     COLONOSCOPY  04/22/2020   CORNEAL TRANSPLANT Left    2006 and 2016   CRANIOTOMY Left 08/09/2019   Procedure: CRANIOTOMY HEMATOMA EVACUATION SUBDURAL;  Surgeon: Ashok Pall, MD;  Location: [facility];  Service: Neurosurgery;  Laterality: Left;  left   EYE SURGERY     TOTAL KNEE ARTHROPLASTY Right 10/10/2020   Procedure: RIGHT TOTAL KNEE ARTHROPLASTY;  Surgeon: Leandrew Koyanagi, MD;  Location: [facility];  Service: Orthopedics;  Laterality: Right;   TUBAL LIGATION      There were no vitals filed for this visit.   Subjective Assessment - 12/04/20 1111     Subjective Pt reports knee is feeling better, more mobile    Patient is accompained by: Interpreter   Michelle Carrillo   Pertinent History Rt TKA,CVA    Limitations Standing;Walking;House  hold activities    Patient Stated Goals reduce pain    Currently in Pain? Yes    Pain Score 7     Pain Location Knee    Pain Orientation Right    Pain Descriptors / Indicators Aching                [facility] PT Assessment - 12/04/20 0001       AROM   AROM Assessment Site Knee    Right/Left Knee Right    Right Knee Flexion 99      PROM   PROM Assessment Site Knee    Right/Left Knee Right    Right Knee Flexion 106                           [facility] Adult PT Treatment/Exercise - 12/04/20 0001       Knee/Hip Exercises: Stretches   Sports administrator Right;2 reps;60 seconds    Quad Stretch Limitations in prone with strap    Knee: Self-Stretch to increase Flexion Right    Knee: Self-Stretch Limitations lunges on step x 20  Knee/Hip Exercises: Aerobic   Recumbent Bike seat 3 to 2 with full revolutions x 5 min    Nustep Nu step L5  seat 2 x 3 min; seat 1 x 2 min      Knee/Hip Exercises: Standing   Hip Flexion Both;20 reps;Knee bent    Hip Extension Both;20 reps    Extension Limitations GTB    Other Standing Knee Exercises lateral walking GTB x 40 ft      Manual Therapy   Manual therapy comments into flexion in sitting and supine                      PT Short Term Goals - 12/04/20 1155       PT SHORT TERM GOAL #1   Title Pt will be Independent and compliant with initial HEP    Status On-going               PT Long Term Goals - 12/04/20 1156       PT LONG TERM GOAL #1   Title Patient will be independent with final progressive HEP for strengthening/balance (ALL LTGS DUE: 9/12/22_    Status On-going      PT LONG TERM GOAL #2   Title Pt will be able to ambulate community distances mod I to independent with LRAD    Status Achieved      PT LONG TERM GOAL #3   Title She will improve Rt knee ROM 0-115 degreees to improve funciton.    Baseline 99 deg flexion 12/04/20    Status On-going      PT LONG TERM GOAL #5   Title She will have  overall less than 3/10 pain with general activity.    Status On-going                   Plan - 12/04/20 1149     Clinical Impression Statement Patient is making gains with active flexion but still significantly limited to 99 deg flexion. She reports noncompliance with HEP due to "no good reason". May be due to cognitive deficits. PT stressed importance of compliance and suggested she try the lunging stretch on her bottom step. She was told that it will hurt and that pain should improve as compliance increases. She tolerates aggressive ROM in the clinic well.    PT Frequency 2x / week    PT Duration 8 weeks    PT Treatment/Interventions ADLs/Self Care Home Management;Cryotherapy;Electrical Stimulation;Iontophoresis '4mg'$ /ml Dexamethasone;Moist Heat;Ultrasound;Gait training;Therapeutic activities;Therapeutic exercise;Neuromuscular re-education;Balance training;Manual techniques;Passive range of motion;Dry needling;Joint Manipulations;Vasopneumatic Device;Taping    PT Next Visit Plan Knee flexion AROM, quadriceps strength, balance and gait specific activities PRN    Consulted and Agree with Plan of Care Patient             Patient will benefit from skilled therapeutic intervention in order to improve the following deficits and impairments:  Decreased activity tolerance, Decreased balance, Decreased coordination, Decreased endurance, Decreased mobility, Decreased range of motion, Decreased strength, Difficulty walking, Impaired flexibility, Increased edema, Pain  Visit Diagnosis: Chronic pain of right knee  Difficulty in walking, not elsewhere classified  Muscle weakness (generalized)  Localized edema     Problem List Patient Active Problem List   Diagnosis Date Noted   Status post total right knee replacement 10/10/2020   Vitamin D deficiency 04/24/2020   Chronic RUQ pain 01/14/2020   Hyperlipemia 08/23/2019   ICH (intracerebral hemorrhage) (HCC) L parietal w/ L SDH s/p  crani  08/09/2019   Healthcare maintenance 06/20/2019   History of strabismus surgery 03/31/2019   Moderate, recurrent MDD 06/06/2018   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 [place], [place], [address] Phone: [phone]   Fax:  [phone]  Name: Michelle Carrillo MRN: [id number] Date of Birth: March 05, 1951

## 2020-12-09 ENCOUNTER — Ambulatory Visit (INDEPENDENT_AMBULATORY_CARE_PROVIDER_SITE_OTHER): Payer: Medicare Other | Admitting: Physical Therapy

## 2020-12-09 ENCOUNTER — Other Ambulatory Visit: Payer: Self-pay

## 2020-12-09 ENCOUNTER — Encounter: Payer: Self-pay | Admitting: Physical Therapy

## 2020-12-09 DIAGNOSIS — G8929 Other chronic pain: Secondary | ICD-10-CM | POA: Diagnosis not present

## 2020-12-09 DIAGNOSIS — M6281 Muscle weakness (generalized): Secondary | ICD-10-CM

## 2020-12-09 DIAGNOSIS — R262 Difficulty in walking, not elsewhere classified: Secondary | ICD-10-CM | POA: Diagnosis not present

## 2020-12-09 DIAGNOSIS — R600 Localized edema: Secondary | ICD-10-CM

## 2020-12-09 DIAGNOSIS — M25561 Pain in right knee: Secondary | ICD-10-CM

## 2020-12-09 NOTE — Therapy (Signed)
[place], [place], [address] Phone: [phone]   Fax:  [phone]  Physical Therapy Treatment  Patient Details  Name: Michelle Carrillo MRN: [id number] Date of Birth: 25-Mar-1951 Referring Provider (PT): Dollene Primrose   Encounter Date: 12/09/2020   PT End of Session - 12/09/20 1006     Visit Number 12    Number of Visits 15    Date for PT Re-Evaluation 12/22/20    Authorization Type MCR    Progress Note Due on Visit 17    PT Start Time 0928    PT Stop Time 1016    PT Time Calculation (min) 48 min    Activity Tolerance Patient tolerated treatment well;No increased pain    Behavior During Therapy WFL for tasks assessed/performed             Past Medical History:  Diagnosis Date   Anxiety    on meds   Arthritis    bilateral knees   Cataract    RIGHT eye   Depression    on meds   Herpes zoster kerstitis s/p corneal transplant 2015    Hypertension    on meds   Right knee osteoarthritis    Strabismus    due to eye injury as a child   Stroke ([place]) 07/2019   stroke with craniotomy    Past Surgical History:  Procedure Laterality Date   CESAREAN SECTION      x 2    CHOLECYSTECTOMY     COLONOSCOPY  04/22/2020   CORNEAL TRANSPLANT Left    2006 and 2016   CRANIOTOMY Left 08/09/2019   Procedure: CRANIOTOMY HEMATOMA EVACUATION SUBDURAL;  Surgeon: Ashok Pall, MD;  Location: [place] [facility];  Service: Neurosurgery;  Laterality: Left;  left   EYE SURGERY     TOTAL KNEE ARTHROPLASTY Right 10/10/2020   Procedure: RIGHT TOTAL KNEE ARTHROPLASTY;  Surgeon: Leandrew Koyanagi, MD;  Location: [place];  Service: Orthopedics;  Laterality: Right;   TUBAL LIGATION      There were no vitals filed for this visit.   Subjective Assessment - 12/09/20 0931     Subjective knee is doing okay, has a little pain    Patient is accompained by: Interpreter   Michelle Carrillo   Pertinent History Rt TKA,CVA    Limitations Standing;Walking;House hold  activities    Patient Stated Goals reduce pain    Currently in Pain? Yes    Pain Score 6     Pain Location Knee    Pain Orientation Right    Pain Descriptors / Indicators Aching    Pain Type Surgical pain    Pain Onset More than a month ago    Pain Frequency Intermittent    Aggravating Factors  weight bearing, end range stretching    Pain Relieving Factors ice, rest                [facility] PT Assessment - 12/09/20 0959       AROM   Right Knee Flexion 101      PROM   Right Knee Flexion 112                           [facility] Adult PT Treatment/Exercise - 12/09/20 0934       Knee/Hip Exercises: Stretches   Other Knee/Hip Stretches seated tailgate stretch x 2 min; then short sitting 10s 10x      Knee/Hip Exercises: Aerobic  Recumbent Bike seat 3 full revolutions x 8 min      Knee/Hip Exercises: Seated   Long Arc Quad Both;3 sets;10 reps;Weights    Long Arc Quad Weight 5 lbs.    Long Arc Quad Limitations 3 sec hold      Knee/Hip Exercises: Supine   Bridges 2 sets;10 reps      Modalities   Modalities Vasopneumatic      Vasopneumatic   Number Minutes Vasopneumatic  10 minutes    Vasopnuematic Location  Knee    Vasopneumatic Pressure Medium    Vasopneumatic Temperature  34      Manual Therapy   Manual therapy comments seated flexion to tolerance                      PT Short Term Goals - 12/04/20 1155       PT SHORT TERM GOAL #1   Title Pt will be Independent and compliant with initial HEP    Status On-going               PT Long Term Goals - 12/04/20 1156       PT LONG TERM GOAL #1   Title Patient will be independent with final progressive HEP for strengthening/balance (ALL LTGS DUE: 9/12/22_    Status On-going      PT LONG TERM GOAL #2   Title Pt will be able to ambulate community distances mod I to independent with LRAD    Status Achieved      PT LONG TERM GOAL #3   Title She will improve Rt knee ROM 0-115  degreees to improve funciton.    Baseline 99 deg flexion 12/04/20    Status On-going      PT LONG TERM GOAL #5   Title She will have overall less than 3/10 pain with general activity.    Status On-going                   Plan - 12/09/20 1006     Clinical Impression Statement Pt demonstrating improvement in flexion today and feel this is mostly limited due to swelling, so utilized vaso todya.  Will continue to benefit from PT to maximize function.    Comorbidities CVA with decreased peripheral vision and memory    Examination-Activity Limitations Carry;Lift;Stand;Stairs;Squat;Sleep;Locomotion Level;Transfers;Bend    Examination-Participation Restrictions Cleaning;Community Activity;Driving;Laundry;Shop    PT Frequency 2x / week    PT Duration 8 weeks    PT Treatment/Interventions ADLs/Self Care Home Management;Cryotherapy;Electrical Stimulation;Iontophoresis '4mg'$ /ml Dexamethasone;Moist Heat;Ultrasound;Gait training;Therapeutic activities;Therapeutic exercise;Neuromuscular re-education;Balance training;Manual techniques;Passive range of motion;Dry needling;Joint Manipulations;Vasopneumatic Device;Taping    PT Next Visit Plan Knee flexion AROM, quadriceps strength, balance and gait specific activities PRN    PT Home Exercise Plan Access Code: [email or web address]    Consulted and Agree with Plan of Care Patient             Patient will benefit from skilled therapeutic intervention in order to improve the following deficits and impairments:  Decreased activity tolerance, Decreased balance, Decreased coordination, Decreased endurance, Decreased mobility, Decreased range of motion, Decreased strength, Difficulty walking, Impaired flexibility, Increased edema, Pain  Visit Diagnosis: Chronic pain of right knee  Difficulty in walking, not elsewhere classified  Muscle weakness (generalized)  Localized edema     Problem List Patient Active Problem List   Diagnosis Date Noted   Status  post total right knee replacement 10/10/2020   Vitamin D deficiency 04/24/2020   Chronic RUQ pain  01/14/2020   Hyperlipemia 08/23/2019   ICH (intracerebral hemorrhage) (HCC) L parietal w/ L SDH s/p crani 08/09/2019   Healthcare maintenance 06/20/2019   History of strabismus surgery 03/31/2019   Moderate, recurrent MDD 06/06/2018   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

## 2020-12-10 ENCOUNTER — Ambulatory Visit (INDEPENDENT_AMBULATORY_CARE_PROVIDER_SITE_OTHER): Payer: Medicare Other | Admitting: Rehabilitative and Restorative Service Providers"

## 2020-12-10 ENCOUNTER — Encounter: Payer: Self-pay | Admitting: Rehabilitative and Restorative Service Providers"

## 2020-12-10 DIAGNOSIS — M25561 Pain in right knee: Secondary | ICD-10-CM | POA: Diagnosis not present

## 2020-12-10 DIAGNOSIS — R600 Localized edema: Secondary | ICD-10-CM | POA: Diagnosis not present

## 2020-12-10 DIAGNOSIS — G8929 Other chronic pain: Secondary | ICD-10-CM | POA: Diagnosis not present

## 2020-12-10 DIAGNOSIS — M6281 Muscle weakness (generalized): Secondary | ICD-10-CM | POA: Diagnosis not present

## 2020-12-10 DIAGNOSIS — R262 Difficulty in walking, not elsewhere classified: Secondary | ICD-10-CM

## 2020-12-10 NOTE — Patient Instructions (Signed)
Access Code: [email or web address] URL: [email or web address] Date: 12/10/2020 Prepared by: Vista Mink  Exercises Seated Hamstring Stretch - 2 x daily - 6 x weekly - 1 sets - 2 reps - 30 hold Seated Knee Flexion Stretch - 2 x daily - 6 x weekly - 1-2 sets - 10 reps - 5 sec hold Supine Active Straight Leg Raise - 2 x daily - 6 x weekly - 1-3 sets - 10 reps Seated Long Arc Quad - 2 x daily - 6 x weekly - 2-3 sets - 10 reps - 3 hold Supine Knee Extension Mobilization with Weight - 2 x daily - 6 x weekly - 1 sets - 3-5 min hold Standing Hip Abduction - 2 x daily - 6 x weekly - 2-3 sets - 10 reps Mini Squat with Counter Support - 2 x daily - 6 x weekly - 1-2 sets - 10 reps Prone Quadriceps Stretch with Strap - 2-3 x daily - 7 x weekly - 1 sets - 3 reps - 60 sec hold Supine Quadricep Sets - 3-5 x daily - 7 x weekly - 2-3 sets - 10 reps - 5 second hold Seated Knee Flexion AAROM - 3-5 x daily - 7 x weekly - 1 sets - 1 reps - 3 minutes hold

## 2020-12-10 NOTE — Therapy (Signed)
[place], [place], [address] Phone: [phone]   Fax:  [phone]  Physical Therapy Treatment  Patient Details  Name: Michelle Carrillo MRN: [id number] Date of Birth: [birth year] Referring Provider (PT): Dollene Primrose   Encounter Date: 12/10/2020   PT End of Session - 12/10/20 1632     Visit Number 13    Number of Visits 15    Date for PT Re-Evaluation 12/22/20    Authorization Type MCR    Progress Note Due on Visit 17    PT Start Time 1430    PT Stop Time 1510    PT Time Calculation (min) 40 min    Activity Tolerance Patient tolerated treatment well;No increased pain    Behavior During Therapy WFL for tasks assessed/performed             Past Medical History:  Diagnosis Date   Anxiety    on meds   Arthritis    bilateral knees   Cataract    RIGHT eye   Depression    on meds   Herpes zoster kerstitis s/p corneal transplant 2015    Hypertension    on meds   Right knee osteoarthritis    Strabismus    due to eye injury as a child   Stroke ([place]) 07/2019   stroke with craniotomy    Past Surgical History:  Procedure Laterality Date   CESAREAN SECTION      x 2    CHOLECYSTECTOMY     COLONOSCOPY  04/22/2020   CORNEAL TRANSPLANT Left    2006 and 2016   CRANIOTOMY Left 08/09/2019   Procedure: CRANIOTOMY HEMATOMA EVACUATION SUBDURAL;  Surgeon: Ashok Pall, MD;  Location: [place];  Service: Neurosurgery;  Laterality: Left;  left   EYE SURGERY     TOTAL KNEE ARTHROPLASTY Right 10/10/2020   Procedure: RIGHT TOTAL KNEE ARTHROPLASTY;  Surgeon: Leandrew Koyanagi, MD;  Location: [place];  Service: Orthopedics;  Laterality: Right;   TUBAL LIGATION      There were no vitals filed for this visit.   Subjective Assessment - 12/10/20 1431     Subjective Michelle Carrillo is sleeping well.  She is using Advil for pain.    Patient is accompained by: Interpreter   [name]   Pertinent History Rt TKA,CVA    Limitations  Standing;Walking;House hold activities    Patient Stated Goals reduce pain    Currently in Pain? Yes    Pain Score 5     Pain Location Knee    Pain Orientation Right    Pain Descriptors / Indicators Aching    Pain Type Surgical pain    Pain Radiating Towards NA    Pain Onset More than a month ago    Pain Frequency Intermittent    Aggravating Factors  End range AROM and prolonged WB (later in the day with fatigue)    Pain Relieving Factors Ice and change of position    Effect of Pain on Daily Activities Edema with too much WB, needs a cane to avoid edema    Multiple Pain Sites No                [facility] PT Assessment - 12/10/20 0001       AROM   Right Knee Extension -4                           [facility] Adult PT Treatment/Exercise - 12/10/20  0001       Exercises   Exercises Knee/Hip      Knee/Hip Exercises: Stretches   Other Knee/Hip Stretches Tailgate knee flexion AROM 1 minute    Other Knee/Hip Stretches AAROM knee flexion (L pushes R into flexion) 10X 10 seconds      Knee/Hip Exercises: Aerobic   Recumbent Bike Seat 2 for flexion AAROM to full revolutions 8 minutes      Knee/Hip Exercises: Machines for Strengthening   Total Gym Leg Press 106# 20X slow eccentrics push into extension and stretch at full flexion      Knee/Hip Exercises: Seated   Other Seated Knee/Hip Exercises --      Knee/Hip Exercises: Supine   Quad Sets Strengthening;Both;3 sets;10 reps;Limitations    Quad Sets Limitations 5 seconds (toes back, press knees down and tighten thighs)                    PT Education - 12/10/20 1515     Education Details Reinforced quadriceps and knee flexion AROM activities with HEP, progressing WB as tolerated while watching edema.    Person(s) Educated Patient    Methods Explanation;Demonstration;Tactile cues;Verbal cues;Handout    Comprehension Verbal cues required;Returned demonstration;Need further instruction;Tactile cues  required;Verbalized understanding              PT Short Term Goals - 12/10/20 1630       PT SHORT TERM GOAL #1   Title Pt will be Independent and compliant with initial HEP    Status Achieved               PT Long Term Goals - 12/10/20 1631       PT LONG TERM GOAL #1   Title Patient will be independent with final progressive HEP for strengthening/balance (ALL LTGS DUE: 9/12/22_    Status On-going      PT LONG TERM GOAL #2   Title Pt will be able to ambulate community distances mod I to independent with LRAD    Status Achieved      PT LONG TERM GOAL #3   Title She will improve Rt knee ROM 0-115 degreees to improve funciton.    Baseline 99 deg flexion 12/04/20 and -4 (edema) on 12/10/2020    Status On-going      PT LONG TERM GOAL #4   Title She will improve Rt hip/knee strength to 5/5 MMT tested in sitting.    Baseline 4+ now    Time 8    Period Weeks    Status On-going    Target Date 12/22/20      PT LONG TERM GOAL #5   Title She will have overall less than 3/10 pain with general activity.    Baseline 3-5 now    Status On-going    Target Date 12/22/20                   Plan - 12/10/20 1633     Clinical Impression Statement Michelle Carrillo is making progress with her AROM and working on reducing her use of the cane.  She was cautioned not to force WB as edema has been more of a problem recently as she transitions from a cane to no AD.  Increased strength work (quad sets with elevation) along with continued flexion exercises were emphasized with her HEP with more quad strength and balance recommended next visit.    Personal Factors and Comorbidities Comorbidity 1    Comorbidities CVA with decreased  peripheral vision and memory    Examination-Activity Limitations Carry;Lift;Stand;Stairs;Squat;Sleep;Locomotion Level;Transfers;Bend    Examination-Participation Restrictions Cleaning;Community Activity;Driving;Laundry;Shop    Stability/Clinical Decision Making  Evolving/Moderate complexity    Rehab Potential Good    PT Frequency 2x / week    PT Duration 8 weeks    PT Treatment/Interventions ADLs/Self Care Home Management;Cryotherapy;Electrical Stimulation;Iontophoresis '4mg'$ /ml Dexamethasone;Moist Heat;Ultrasound;Gait training;Therapeutic activities;Therapeutic exercise;Neuromuscular re-education;Balance training;Manual techniques;Passive range of motion;Dry needling;Joint Manipulations;Vasopneumatic Device;Taping    PT Next Visit Plan Knee flexion AROM, quadriceps strength, balance and gait specific activities, edema control    PT Home Exercise Plan Access Code: [email or web address]    Consulted and Agree with Plan of Care Patient             Patient will benefit from skilled therapeutic intervention in order to improve the following deficits and impairments:  Decreased activity tolerance, Decreased balance, Decreased coordination, Decreased endurance, Decreased mobility, Decreased range of motion, Decreased strength, Difficulty walking, Impaired flexibility, Increased edema, Pain  Visit Diagnosis: Difficulty in walking, not elsewhere classified  Muscle weakness (generalized)  Localized edema  Chronic pain of right knee     Problem List Patient Active Problem List   Diagnosis Date Noted   Status post total right knee replacement 10/10/2020   Vitamin D deficiency 04/24/2020   Chronic RUQ pain 01/14/2020   Hyperlipemia 08/23/2019   ICH (intracerebral hemorrhage) (HCC) L parietal w/ L SDH s/p crani 08/09/2019   Healthcare maintenance 06/20/2019   History of strabismus surgery 03/31/2019   Moderate, recurrent MDD 06/06/2018   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

## 2020-12-11 ENCOUNTER — Encounter: Payer: Medicare Other | Admitting: Physical Therapy

## 2020-12-19 ENCOUNTER — Ambulatory Visit (INDEPENDENT_AMBULATORY_CARE_PROVIDER_SITE_OTHER): Payer: Medicare Other | Admitting: Orthopaedic Surgery

## 2020-12-19 ENCOUNTER — Other Ambulatory Visit: Payer: Self-pay

## 2020-12-19 ENCOUNTER — Encounter: Payer: Self-pay | Admitting: Orthopaedic Surgery

## 2020-12-19 DIAGNOSIS — Z96651 Presence of right artificial knee joint: Secondary | ICD-10-CM

## 2020-12-19 NOTE — Progress Notes (Signed)
Post-Op Visit Note   Patient: Michelle Carrillo           Date of Birth: 1950-08-07           MRN: [id number] Visit Date: 12/19/2020 PCP: Orvis Brill, MD   Assessment & Plan:  Chief Complaint:  Chief Complaint  Patient presents with   Right Knee - Follow-up    Right total knee arthroplasty 10/10/2020   Visit Diagnoses:  1. Status post total right knee replacement     Plan: Patient is 10 weeks status post right total knee replacement on 10/10/2020.  She has been released from PT to do home exercises.  Her daughter is present today as well as the interpreter.  She is mainly reporting tightness and swelling of the right knee.  Denies any constitutional symptoms.  Right knee shows a fully healed surgical scar.  Range of motion is 5 to 90 degrees.  Stable to varus valgus no signs of infection.  Unfortunately I feel her range of motion has not improved as much as I would like and I think that she is having trouble in terms of function because of the lack of range of motion.  We had a very long discussion about doing a manipulation to help jumpstart flexion.  I do also detect that there may be an issue with effort and participation from the patient's part as well.  She understands the importance of physical therapy and home exercises.  She understands that she will have to work very hard after the manipulation to maintain range of motion.  Risk benefits related to manipulation reviewed with the patient and her daughter.  Questions encouraged and answered.  Follow-Up Instructions: No follow-ups on file.   Orders:  No orders of the defined types were placed in this encounter.  No orders of the defined types were placed in this encounter.   Imaging: No results found.  PMFS History: Patient Active Problem List   Diagnosis Date Noted   Status post total right knee replacement 10/10/2020   Vitamin D deficiency 04/24/2020   Chronic RUQ pain 01/14/2020   Hyperlipemia  08/23/2019   ICH (intracerebral hemorrhage) (HCC) L parietal w/ L SDH s/p crani 08/09/2019   Healthcare maintenance 06/20/2019   History of strabismus surgery 03/31/2019   Moderate, recurrent MDD 06/06/2018   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 06/06/2018   Primary osteoarthritis of right knee 06/05/2018   Essential hypertension 06/05/2018   Anxiety 06/05/2018   Age-related nuclear cataract of both eyes 11/26/2014   Diplopia 11/26/2014   Monocular esotropia of left eye 11/26/2014   Past Medical History:  Diagnosis Date   Anxiety    on meds   Arthritis    bilateral knees   Cataract    RIGHT eye   Depression    on meds   Herpes zoster kerstitis s/p corneal transplant 2015    Hypertension    on meds   Right knee osteoarthritis    Strabismus    due to eye injury as a child   Stroke ([place]) 07/2019   stroke with craniotomy    Family History  Problem Relation Age of Onset   Diabetes Mother    Ovarian cancer Mother    High blood pressure Sister    Diabetes Sister    Diabetes Brother    Stomach cancer Maternal Grandmother 7   Colon polyps Neg Hx    Colon cancer Neg Hx    Rectal  cancer Neg Hx    Esophageal cancer Neg Hx     Past Surgical History:  Procedure Laterality Date   CESAREAN SECTION      x 2    CHOLECYSTECTOMY     COLONOSCOPY  04/22/2020   CORNEAL TRANSPLANT Left    2006 and 2016   CRANIOTOMY Left 08/09/2019   Procedure: CRANIOTOMY HEMATOMA EVACUATION SUBDURAL;  Surgeon: Ashok Pall, MD;  Location: [place];  Service: Neurosurgery;  Laterality: Left;  left   EYE SURGERY     TOTAL KNEE ARTHROPLASTY Right 10/10/2020   Procedure: RIGHT TOTAL KNEE ARTHROPLASTY;  Surgeon: Leandrew Koyanagi, MD;  Location: [facility] [place];  Service: Orthopedics;  Laterality: Right;   TUBAL LIGATION     Social History   Occupational History   Not on file  Tobacco Use   Smoking status: Never   Smokeless tobacco: Never  Vaping Use   Vaping Use: Never used   Substance and Sexual Activity   Alcohol use: Never   Drug use: Never   Sexual activity: Not on file

## 2020-12-22 ENCOUNTER — Other Ambulatory Visit: Payer: Self-pay | Admitting: Physician Assistant

## 2020-12-22 ENCOUNTER — Ambulatory Visit (HOSPITAL_BASED_OUTPATIENT_CLINIC_OR_DEPARTMENT_OTHER)
Admission: RE | Admit: 2020-12-22 | Discharge: 2020-12-22 | Disposition: A | Discharge status: Home / Self Care | Payer: Medicare Other | Source: Ambulatory Visit | Attending: Internal Medicine | Admitting: Internal Medicine

## 2020-12-22 ENCOUNTER — Encounter (HOSPITAL_BASED_OUTPATIENT_CLINIC_OR_DEPARTMENT_OTHER): Payer: Self-pay

## 2020-12-22 ENCOUNTER — Other Ambulatory Visit: Payer: Self-pay

## 2020-12-22 DIAGNOSIS — Z0000 Encounter for general adult medical examination without abnormal findings: Secondary | ICD-10-CM | POA: Insufficient documentation

## 2020-12-22 DIAGNOSIS — Z1231 Encounter for screening mammogram for malignant neoplasm of breast: Secondary | ICD-10-CM | POA: Insufficient documentation

## 2020-12-22 MED ORDER — ONDANSETRON HCL 4 MG PO TABS: 4.0000 mg | ORAL_TABLET | Freq: Three times a day (TID) | ORAL | 0 refills | Status: DC | PRN

## 2020-12-22 MED ORDER — HYDROCODONE-ACETAMINOPHEN 5-325 MG PO TABS: 1.0000 | ORAL_TABLET | Freq: Three times a day (TID) | ORAL | 0 refills | Status: DC | PRN

## 2020-12-25 DIAGNOSIS — Z96651 Presence of right artificial knee joint: Secondary | ICD-10-CM | POA: Diagnosis not present

## 2020-12-25 DIAGNOSIS — M24661 Ankylosis, right knee: Secondary | ICD-10-CM | POA: Diagnosis not present

## 2020-12-25 DIAGNOSIS — T8482XA Fibrosis due to internal orthopedic prosthetic devices, implants and grafts, initial encounter: Secondary | ICD-10-CM | POA: Diagnosis not present

## 2020-12-26 ENCOUNTER — Telehealth: Payer: Self-pay | Admitting: Orthopaedic Surgery

## 2020-12-26 NOTE — Telephone Encounter (Signed)
Pt called and wondering when she needs to start exercising?   CB [phone]

## 2020-12-26 NOTE — Telephone Encounter (Signed)
Patients daughter aware

## 2020-12-29 ENCOUNTER — Other Ambulatory Visit: Payer: Self-pay

## 2020-12-29 ENCOUNTER — Ambulatory Visit (INDEPENDENT_AMBULATORY_CARE_PROVIDER_SITE_OTHER): Payer: Medicare Other | Admitting: Physical Therapy

## 2020-12-29 DIAGNOSIS — M25561 Pain in right knee: Secondary | ICD-10-CM | POA: Diagnosis not present

## 2020-12-29 DIAGNOSIS — M6281 Muscle weakness (generalized): Secondary | ICD-10-CM

## 2020-12-29 DIAGNOSIS — G8929 Other chronic pain: Secondary | ICD-10-CM

## 2020-12-29 DIAGNOSIS — R262 Difficulty in walking, not elsewhere classified: Secondary | ICD-10-CM | POA: Diagnosis not present

## 2020-12-29 DIAGNOSIS — R600 Localized edema: Secondary | ICD-10-CM

## 2020-12-29 DIAGNOSIS — M25661 Stiffness of right knee, not elsewhere classified: Secondary | ICD-10-CM | POA: Diagnosis not present

## 2020-12-29 NOTE — Therapy (Signed)
[facility] Physical Therapy [address] [place], [place], [address] Phone: [phone]   Fax:  [phone]  Physical Therapy Treatment, recertification & progress noted  Patient Details  Name: Michelle Carrillo MRN: [id number] Date of Birth: 1951-02-21 Referring Provider (PT): Dollene Primrose   Encounter Date: 12/29/2020  Progress Note Reporting Period 12/04/2020 to 12/29/2020  See note below for Objective Data and Assessment of Progress/Goals.       PT End of Session - 12/29/20 1446     Visit Number 14    Number of Visits 21    Date for PT Re-Evaluation 01/23/21    Authorization Type MCR    Progress Note Due on Visit 24    PT Start Time 7026    PT Stop Time 1434    PT Time Calculation (min) 49 min    Activity Tolerance Patient tolerated treatment well;No increased pain    Behavior During Therapy WFL for tasks assessed/performed             Past Medical History:  Diagnosis Date   Anxiety    on meds   Arthritis    bilateral knees   Cataract    RIGHT eye   Depression    on meds   Herpes zoster kerstitis s/p corneal transplant 2015    Hypertension    on meds   Right knee osteoarthritis    Strabismus    due to eye injury as a child   Stroke ([place]) 07/2019   stroke with craniotomy    Past Surgical History:  Procedure Laterality Date   CESAREAN SECTION      x 2    CHOLECYSTECTOMY     COLONOSCOPY  04/22/2020   CORNEAL TRANSPLANT Left    2006 and 2016   CRANIOTOMY Left 08/09/2019   Procedure: CRANIOTOMY HEMATOMA EVACUATION SUBDURAL;  Surgeon: Ashok Pall, MD;  Location: [place];  Service: Neurosurgery;  Laterality: Left;  left   EYE SURGERY     TOTAL KNEE ARTHROPLASTY Right 10/10/2020   Procedure: RIGHT TOTAL KNEE ARTHROPLASTY;  Surgeon: Leandrew Koyanagi, MD;  Location: [place];  Service: Orthopedics;  Laterality: Right;   TUBAL LIGATION      There were no vitals filed for this visit.   Subjective Assessment - 12/29/20 1345     Subjective  She had right Total Knee Arthroplasty on 10/10/20. She had 13 OPPT visits 10/27/20 - 12/10/20. She underwent manipulation under anesthesia on 12/25/20. She did her exercises but not as much as she should after PT stopped and her knee tightened back up.    Patient is accompained by: Interpreter   [name]   Pertinent History Rt TKA,CVA    Limitations Standing;Walking;House hold activities    Patient Stated Goals reduce pain    Currently in Pain? Yes    Pain Score 5    ranges from 3/10 to 6/10   Pain Location Knee    Pain Orientation Right;Anterior    Pain Descriptors / Indicators Sore;Pins and needles    Pain Type Chronic pain    Pain Onset More than a month ago    Pain Frequency Constant    Aggravating Factors  walking longer distances    Pain Relieving Factors medicine, resting & ice                [facility] PT Assessment - 12/29/20 1345       Assessment   Medical Diagnosis Rt TKA 10/10/20    Referring Provider (PT) Erlinda Hong, Delane Ginger  Onset Date/Surgical Date 10/10/20    Hand Dominance Right      Observation/Other Assessments-Edema    Edema Circumferential      Circumferential Edema   Circumferential - Right 10cm proximal to patella 54.5cm,  mid-patella 48.0cm  5cm distal to patella 45.1cm    Circumferential - Left  10cm proximal to patella 49.0cm,  mid-patella 45.2cm  5cm distal to patella 38.2cm      AROM   Right Knee Extension -11   seated LAQ   Right Knee Flexion 76   standing     PROM   Right Knee Extension -3   supine   Right Knee Flexion 90   supine                          [facility] Adult PT Treatment/Exercise - 12/29/20 1345       Knee/Hip Exercises: Stretches   Active Hamstring Stretch Right;3 reps;30 seconds    Quad Stretch Right;2 reps;60 seconds    Quad Stretch Limitations in prone with strap    Other Knee/Hip Stretches Tailgate knee flexion AROM 1 minute    Other Knee/Hip Stretches AAROM knee flexion (L pushes R into flexion) 10X 10 seconds      Knee/Hip  Exercises: Standing   Hip Abduction Both;Stengthening;2 sets;10 reps;Knee straight    Abduction Limitations demo & verbal cues on technique, alternating so open & closed chain    Functional Squat 2 sets;10 reps;5 seconds   holding counter   Functional Squat Limitations demo & verbal cues on technique, alternating so open & closed chain      Knee/Hip Exercises: Seated   Long Arc Quad Strengthening;Right;2 sets;15 reps    Long [employer] Limitations verbal cues on technique    Heel Slides AROM;AAROM;Right;2 sets;10 reps    Heel Slides Limitations verbal cues on technique      Knee/Hip Exercises: Supine   Quad Sets Strengthening;Both;3 sets;10 reps   5 sec hold   Quad Sets Limitations tactile & verbal cues on technique    Heel Prop for Knee Extension 3 minutes    Heel Prop for Knee Extension Weight (lbs) 5    Straight Leg Raises Strengthening;Right;2 sets;10 reps    Straight Leg Raises Limitations verbal cues on technique                     PT Education - 12/29/20 1436     Education Details reviewed HEP Access Code: [email or web address]  and need for compliance    Person(s) Educated Patient    Methods Explanation;Demonstration;Tactile cues;Verbal cues;Handout    Comprehension Verbalized understanding;Returned demonstration;Verbal cues required;Tactile cues required              PT Short Term Goals - 12/10/20 1630       PT SHORT TERM GOAL #1   Title Pt will be Independent and compliant with initial HEP    Status Achieved               PT Long Term Goals - 12/29/20 1447       PT LONG TERM GOAL #1   Title Patient will be independent with final progressive HEP for strengthening/balance (ALL LTGS DUE: 01/23/21)    Time 4    Period Weeks    Status On-going    Target Date 01/23/21      PT LONG TERM GOAL #2   Title Patient reports ambulating & ADLs in home without device and  community with single point cane.    Time 4    Period Weeks    Status Revised    Target Date  01/23/21      PT LONG TERM GOAL #3   Title She will improve Rt knee PROM 0-115 degreees to improve funciton.    Baseline --    Time 4    Period Weeks    Status Revised    Target Date 01/23/21      PT LONG TERM GOAL #4   Title She will improve Rt hip/knee strength to 5/5 MMT tested in sitting.    Baseline --    Time 4    Period Weeks    Status On-going    Target Date 01/23/21      PT LONG TERM GOAL #5   Title She will have overall less than 3/10 pain with general activity.    Baseline --    Time 4    Period Weeks    Status On-going    Target Date 01/23/21                   Plan - 12/29/20 1451     Clinical Impression Statement Patient required manipulation under anesthesia due to decrease in knee range.  Patient did report inconsistency with exercising after PT stopped.  Patient has decreased range & strength, increased edema in right knee which are limiting function & mobility.  PT reviewed HEP with emphasis on compliance & full understanding for each exercise.  She did report improved understanding.  Patient would benefit from additional skilled PT to improve function & mobility.    Personal Factors and Comorbidities Comorbidity 1    Comorbidities CVA with decreased peripheral vision and memory    Examination-Activity Limitations Carry;Lift;Stand;Stairs;Squat;Sleep;Locomotion Level;Transfers;Bend    Examination-Participation Restrictions Cleaning;Community Activity;Driving;Laundry;Shop    Stability/Clinical Decision Making Evolving/Moderate complexity    Rehab Potential Good    PT Frequency 2x / week    PT Duration 4 weeks    PT Treatment/Interventions ADLs/Self Care Home Management;Cryotherapy;Electrical Stimulation;Iontophoresis 4mg /ml Dexamethasone;Moist Heat;Ultrasound;Gait training;Therapeutic activities;Therapeutic exercise;Neuromuscular re-education;Balance training;Manual techniques;Passive range of motion;Dry needling;Joint Manipulations;Vasopneumatic  Device;Taping;DME Instruction;Stair training;Functional mobility training;Patient/family education;Scar mobilization    PT Next Visit Plan check HEP and update,  manual therapy & therapeutic exercise to increase range & strength,    PT Home Exercise Plan Access Code: [email or web address]    Consulted and Agree with Plan of Care Patient    Family Member Consulted interpreter present             Patient will benefit from skilled therapeutic intervention in order to improve the following deficits and impairments:  Decreased activity tolerance, Decreased balance, Decreased coordination, Decreased endurance, Decreased mobility, Decreased range of motion, Decreased strength, Difficulty walking, Impaired flexibility, Increased edema, Pain  Visit Diagnosis: Difficulty in walking, not elsewhere classified  Muscle weakness (generalized)  Localized edema  Chronic pain of right knee  Stiffness of right knee, not elsewhere classified     Problem List Patient Active Problem List   Diagnosis Date Noted   Status post total right knee replacement 10/10/2020   Vitamin D deficiency 04/24/2020   Chronic RUQ pain 01/14/2020   Hyperlipemia 08/23/2019   ICH (intracerebral hemorrhage) ([place]) L parietal w/ L SDH s/p crani 08/09/2019   Healthcare maintenance 06/20/2019   History of strabismus surgery 03/31/2019   Moderate, recurrent MDD 06/06/2018   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 06/06/2018   Primary osteoarthritis of right knee  06/05/2018   Essential hypertension 06/05/2018   Anxiety 06/05/2018   Age-related nuclear cataract of both eyes 11/26/2014   Diplopia 11/26/2014   Monocular esotropia of left eye 11/26/2014    Jamey Reas, PT, DPT 12/29/2020, 2:57 PM  [facility] Physical Therapy [address] [place], [place], [address] Phone: [phone]   Fax:  [phone]  Name: Michelle Carrillo MRN: [id number] Date of Birth: [birth year]

## 2020-12-30 ENCOUNTER — Ambulatory Visit (INDEPENDENT_AMBULATORY_CARE_PROVIDER_SITE_OTHER): Payer: Medicare Other | Admitting: Physical Therapy

## 2020-12-30 ENCOUNTER — Encounter: Payer: Self-pay | Admitting: Physical Therapy

## 2020-12-30 DIAGNOSIS — M25561 Pain in right knee: Secondary | ICD-10-CM | POA: Diagnosis not present

## 2020-12-30 DIAGNOSIS — M6281 Muscle weakness (generalized): Secondary | ICD-10-CM

## 2020-12-30 DIAGNOSIS — R600 Localized edema: Secondary | ICD-10-CM | POA: Diagnosis not present

## 2020-12-30 DIAGNOSIS — R262 Difficulty in walking, not elsewhere classified: Secondary | ICD-10-CM

## 2020-12-30 DIAGNOSIS — M25661 Stiffness of right knee, not elsewhere classified: Secondary | ICD-10-CM

## 2020-12-30 DIAGNOSIS — G8929 Other chronic pain: Secondary | ICD-10-CM

## 2020-12-30 NOTE — Therapy (Signed)
[facility] [place] [place], [place], [address] Phone: [phone]   Fax:  [phone]  Physical Therapy Treatment  Patient Details  Name: Michelle Carrillo MRN: [id number] Date of Birth: 12-Nov-1950 Referring Provider (PT): Dollene Primrose   Encounter Date: 12/30/2020   PT End of Session - 12/30/20 0931     Visit Number 15    Number of Visits 21    Date for PT Re-Evaluation 01/23/21    Authorization Type MCR    Progress Note Due on Visit 24    PT Start Time 0845    PT Stop Time 0929    PT Time Calculation (min) 44 min    Activity Tolerance Patient tolerated treatment well;No increased pain    Behavior During Therapy WFL for tasks assessed/performed             Past Medical History:  Diagnosis Date   Anxiety    on meds   Arthritis    bilateral knees   Cataract    RIGHT eye   Depression    on meds   Herpes zoster kerstitis s/p corneal transplant 2015    Hypertension    on meds   Right knee osteoarthritis    Strabismus    due to eye injury as a child   Stroke ([place]) 07/2019   stroke with craniotomy    Past Surgical History:  Procedure Laterality Date   CESAREAN SECTION      x 2    CHOLECYSTECTOMY     COLONOSCOPY  04/22/2020   CORNEAL TRANSPLANT Left    2006 and 2016   CRANIOTOMY Left 08/09/2019   Procedure: CRANIOTOMY HEMATOMA EVACUATION SUBDURAL;  Surgeon: Ashok Pall, MD;  Location: [place];  Service: Neurosurgery;  Laterality: Left;  left   EYE SURGERY     TOTAL KNEE ARTHROPLASTY Right 10/10/2020   Procedure: RIGHT TOTAL KNEE ARTHROPLASTY;  Surgeon: Leandrew Koyanagi, MD;  Location: [place];  Service: Orthopedics;  Laterality: Right;   TUBAL LIGATION      There were no vitals filed for this visit.   Subjective Assessment - 12/30/20 0849     Subjective knee is sore today    Patient is accompained by: Interpreter   [name]   Pertinent History Rt TKA,CVA    Limitations Standing;Walking;House hold activities    Patient  Stated Goals reduce pain    Currently in Pain? Yes    Pain Score 7     Pain Location Knee    Pain Orientation Right    Pain Descriptors / Indicators Sore    Pain Type Chronic pain;Acute pain;Surgical pain    Pain Onset More than a month ago    Pain Frequency Constant    Aggravating Factors  walking longer distances    Pain Relieving Factors meds, rest, ice                [facility] PT Assessment - 12/30/20 0914       Assessment   Medical Diagnosis Rt TKA 10/10/20    Referring Provider (PT) Erlinda Hong, N      AROM   Right Knee Flexion 103   supine     PROM   Right Knee Flexion 111   active assisted                          [facility] Adult PT Treatment/Exercise - 12/30/20 0850       Knee/Hip Exercises: Stretches  Other Knee/Hip Stretches Tailgate knee flexion AROM 2 minutes    Other Knee/Hip Stretches AAROM knee flexion (L pushes R into flexion) 10X 10 seconds      Knee/Hip Exercises: Aerobic   Nustep L6 x 8 min      Knee/Hip Exercises: Seated   Other Seated Knee/Hip Exercises seated SLR x 20 reps on Rt      Knee/Hip Exercises: Supine   Heel Slides AROM;Right;10 reps    Heel Slides Limitations with strap, 10 sec hold    Heel Prop for Knee Extension 3 minutes    Heel Prop for Knee Extension Weight (lbs) 5      Manual Therapy   Manual therapy comments seated Rt knee flexion to tolerance                       PT Short Term Goals - 12/10/20 1630       PT SHORT TERM GOAL #1   Title Pt will be Independent and compliant with initial HEP    Status Achieved               PT Long Term Goals - 12/29/20 1447       PT LONG TERM GOAL #1   Title Patient will be independent with final progressive HEP for strengthening/balance (ALL LTGS DUE: 01/23/21)    Time 4    Period Weeks    Status On-going    Target Date 01/23/21      PT LONG TERM GOAL #2   Title Patient reports ambulating & ADLs in home without device and community with single point  cane.    Time 4    Period Weeks    Status Revised    Target Date 01/23/21      PT LONG TERM GOAL #3   Title She will improve Rt knee PROM 0-115 degreees to improve funciton.    Baseline --    Time 4    Period Weeks    Status Revised    Target Date 01/23/21      PT LONG TERM GOAL #4   Title She will improve Rt hip/knee strength to 5/5 MMT tested in sitting.    Baseline --    Time 4    Period Weeks    Status On-going    Target Date 01/23/21      PT LONG TERM GOAL #5   Title She will have overall less than 3/10 pain with general activity.    Baseline --    Time 4    Period Weeks    Status On-going    Target Date 01/23/21                   Plan - 12/30/20 0932     Clinical Impression Statement Pt demonstrating significant improvement in Rt knee ROM today and overall progressing quickly with ROM.  Will continue to benefit from PT to maximize function.  Increasing frequency to 4x/wk due to recent manipulation.    Personal Factors and Comorbidities Comorbidity 1    Comorbidities CVA with decreased peripheral vision and memory    Examination-Activity Limitations Carry;Lift;Stand;Stairs;Squat;Sleep;Locomotion Level;Transfers;Bend    Examination-Participation Restrictions Cleaning;Community Activity;Driving;Laundry;Shop    Stability/Clinical Decision Making Evolving/Moderate complexity    Rehab Potential Good    PT Frequency 4x / week   4x/wk x 1 wk, then 1-2 x/wk x 3 wks   PT Duration 4 weeks    PT Treatment/Interventions ADLs/Self Care Home Management;Cryotherapy;Dealer  Stimulation;Iontophoresis 4mg /ml Dexamethasone;Moist Heat;Ultrasound;Gait training;Therapeutic activities;Therapeutic exercise;Neuromuscular re-education;Balance training;Manual techniques;Passive range of motion;Dry needling;Joint Manipulations;Vasopneumatic Device;Taping;DME Instruction;Stair training;Functional mobility training;Patient/family education;Scar mobilization    PT Next Visit Plan manual  therapy & therapeutic exercise to increase range & strength,    PT Home Exercise Plan Access Code: [email or web address]    Consulted and Agree with Plan of Care Patient    Family Member Consulted interpreter present             Patient will benefit from skilled therapeutic intervention in order to improve the following deficits and impairments:  Decreased activity tolerance, Decreased balance, Decreased coordination, Decreased endurance, Decreased mobility, Decreased range of motion, Decreased strength, Difficulty walking, Impaired flexibility, Increased edema, Pain  Visit Diagnosis: Difficulty in walking, not elsewhere classified - Plan: PT plan of care cert/re-cert  Muscle weakness (generalized) - Plan: PT plan of care cert/re-cert  Localized edema - Plan: PT plan of care cert/re-cert  Chronic pain of right knee - Plan: PT plan of care cert/re-cert  Stiffness of right knee, not elsewhere classified - Plan: PT plan of care cert/re-cert     Problem List Patient Active Problem List   Diagnosis Date Noted   Status post total right knee replacement 10/10/2020   Vitamin D deficiency 04/24/2020   Chronic RUQ pain 01/14/2020   Hyperlipemia 08/23/2019   ICH (intracerebral hemorrhage) ([place]) L parietal w/ L SDH s/p crani 08/09/2019   Healthcare maintenance 06/20/2019   History of strabismus surgery 03/31/2019   Moderate, recurrent MDD 06/06/2018   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 06/06/2018   Primary osteoarthritis of right knee 06/05/2018   Essential hypertension 06/05/2018   Anxiety 06/05/2018   Age-related nuclear cataract of both eyes 11/26/2014   Diplopia 11/26/2014   Monocular esotropia of left eye 11/26/2014    Faustino Congress, PT 12/30/2020, 10:31 AM  [facility] Physical Therapy [address] [place], [place], [address] Phone: [phone]   Fax:  [phone]  Name: KELIA GIBBON MRN: [id number] Date of Birth:  [birth year]

## 2020-12-31 ENCOUNTER — Other Ambulatory Visit: Payer: Self-pay

## 2020-12-31 ENCOUNTER — Ambulatory Visit (INDEPENDENT_AMBULATORY_CARE_PROVIDER_SITE_OTHER): Payer: Medicare Other | Admitting: Rehabilitative and Restorative Service Providers"

## 2020-12-31 ENCOUNTER — Other Ambulatory Visit: Payer: Self-pay | Admitting: Internal Medicine

## 2020-12-31 ENCOUNTER — Encounter: Payer: Self-pay | Admitting: Rehabilitative and Restorative Service Providers"

## 2020-12-31 ENCOUNTER — Other Ambulatory Visit: Payer: Self-pay | Admitting: Student

## 2020-12-31 DIAGNOSIS — I10 Essential (primary) hypertension: Secondary | ICD-10-CM

## 2020-12-31 DIAGNOSIS — R262 Difficulty in walking, not elsewhere classified: Secondary | ICD-10-CM

## 2020-12-31 DIAGNOSIS — M6281 Muscle weakness (generalized): Secondary | ICD-10-CM

## 2020-12-31 DIAGNOSIS — M25661 Stiffness of right knee, not elsewhere classified: Secondary | ICD-10-CM | POA: Diagnosis not present

## 2020-12-31 DIAGNOSIS — M25561 Pain in right knee: Secondary | ICD-10-CM

## 2020-12-31 DIAGNOSIS — G8929 Other chronic pain: Secondary | ICD-10-CM | POA: Diagnosis not present

## 2020-12-31 DIAGNOSIS — R600 Localized edema: Secondary | ICD-10-CM | POA: Diagnosis not present

## 2020-12-31 DIAGNOSIS — F331 Major depressive disorder, recurrent, moderate: Secondary | ICD-10-CM

## 2020-12-31 NOTE — Patient Instructions (Signed)
Focus on 3 flexion exercises with HEP 3X/Day.

## 2020-12-31 NOTE — Therapy (Signed)
[facility] [address] [place], [place], [address] Phone: [phone]   Fax:  [phone]  Physical Therapy Treatment  Patient Details  Name: Michelle Carrillo MRN: [id number] Date of Birth: 1951/03/09 Referring Provider (PT): Dollene Primrose   Encounter Date: 12/31/2020   PT End of Session - 12/31/20 1755     Visit Number 16    Number of Visits 21    Date for PT Re-Evaluation 01/23/21    Authorization Type MCR    Progress Note Due on Visit 24    PT Start Time 1345    PT Stop Time 1440    PT Time Calculation (min) 55 min    Activity Tolerance Patient tolerated treatment well    Behavior During Therapy [facility] for tasks assessed/performed             Past Medical History:  Diagnosis Date   Anxiety    on meds   Arthritis    bilateral knees   Cataract    RIGHT eye   Depression    on meds   Herpes zoster kerstitis s/p corneal transplant 2015    Hypertension    on meds   Right knee osteoarthritis    Strabismus    due to eye injury as a child   Stroke ([place]) 07/2019   stroke with craniotomy    Past Surgical History:  Procedure Laterality Date   CESAREAN SECTION      x 2    CHOLECYSTECTOMY     COLONOSCOPY  04/22/2020   CORNEAL TRANSPLANT Left    2006 and 2016   CRANIOTOMY Left 08/09/2019   Procedure: CRANIOTOMY HEMATOMA EVACUATION SUBDURAL;  Surgeon: Ashok Pall, MD;  Location: [place];  Service: Neurosurgery;  Laterality: Left;  left   EYE SURGERY     TOTAL KNEE ARTHROPLASTY Right 10/10/2020   Procedure: RIGHT TOTAL KNEE ARTHROPLASTY;  Surgeon: Leandrew Koyanagi, MD;  Location: [place];  Service: Orthopedics;  Laterality: Right;   TUBAL LIGATION      There were no vitals filed for this visit.   Subjective Assessment - 12/31/20 1350     Subjective Michelle Carrillo rates her R knee pain at 5/10 on the Numeric Pain Rating Scale.    Patient is accompained by: Interpreter   [name]   Pertinent History Rt TKA,CVA    Limitations  Standing;Walking;House hold activities    How long can you sit comfortably? Stiffness after prolonged sitting    How long can you stand comfortably? 30 minutes    How long can you walk comfortably? A block without her cane    Patient Stated Goals Reduce pain and improve flexion AROM    Currently in Pain? Yes    Pain Score 5     Pain Location Knee    Pain Orientation Right    Pain Descriptors / Indicators Tightness;Sore    Pain Type Chronic pain;Surgical pain    Pain Radiating Towards NA    Pain Onset More than a month ago    Pain Frequency Constant    Aggravating Factors  Too much WB    Pain Relieving Factors Rest and ice    Effect of Pain on Daily Activities Edema with WB, uses cane with prolonged WB    Multiple Pain Sites No                [facility] PT Assessment - 12/31/20 0001       AROM   Right Knee Extension -3  Right Knee Flexion 102      PROM   Right Knee Flexion 110                           [facility] Adult PT Treatment/Exercise - 12/31/20 0001       Exercises   Exercises Knee/Hip      Knee/Hip Exercises: Stretches   Other Knee/Hip Stretches Tailgate knee flexion AROM 2X 1 minute    Other Knee/Hip Stretches AAROM knee flexion (L pushes R into flexion) 15X 10 seconds      Knee/Hip Exercises: Aerobic   Recumbent Bike Seat 2 and 3 for 4 minutes each AAROM to AROM      Knee/Hip Exercises: Machines for Strengthening   Total Gym Leg Press 106# 20X slow eccentrics push into extension and stretch at full flexion      Knee/Hip Exercises: Supine   Quad Sets Strengthening;Both;3 sets;10 reps;Limitations    Quad Sets Limitations 5 seconds (toes back, press knees down and tighten thighs)      Modalities   Modalities Vasopneumatic      Vasopneumatic   Number Minutes Vasopneumatic  10 minutes    Vasopnuematic Location  Knee    Vasopneumatic Pressure Medium    Vasopneumatic Temperature  34                     PT Education - 12/31/20  1754     Education Details Reviewed emphasis on flexion with HEP.    Person(s) Educated Patient    Methods Explanation;Demonstration;Tactile cues;Verbal cues;Handout    Comprehension Verbal cues required;Need further instruction;Returned demonstration;Verbalized understanding;Tactile cues required              PT Short Term Goals - 12/10/20 1630       PT SHORT TERM GOAL #1   Title Pt will be Independent and compliant with initial HEP    Status Achieved               PT Long Term Goals - 12/29/20 1447       PT LONG TERM GOAL #1   Title Patient will be independent with final progressive HEP for strengthening/balance (ALL LTGS DUE: 01/23/21)    Time 4    Period Weeks    Status On-going    Target Date 01/23/21      PT LONG TERM GOAL #2   Title Patient reports ambulating & ADLs in home without device and community with single point cane.    Time 4    Period Weeks    Status Revised    Target Date 01/23/21      PT LONG TERM GOAL #3   Title She will improve Rt knee PROM 0-115 degreees to improve funciton.    Baseline --    Time 4    Period Weeks    Status Revised    Target Date 01/23/21      PT LONG TERM GOAL #4   Title She will improve Rt hip/knee strength to 5/5 MMT tested in sitting.    Baseline --    Time 4    Period Weeks    Status On-going    Target Date 01/23/21      PT LONG TERM GOAL #5   Title She will have overall less than 3/10 pain with general activity.    Baseline --    Time 4    Period Weeks    Status On-going  Target Date 01/23/21                   Plan - 12/31/20 1756     Clinical Impression Statement Michelle Carrillo is moving much better than the last time I saw her pre-manipulation.  Flexion AROM is the focus with some quadriceps strength work to help edema and function.  She will benefit from the recommended course of PT.    Personal Factors and Comorbidities Comorbidity 1    Comorbidities CVA with decreased peripheral vision and  memory    Examination-Activity Limitations Carry;Lift;Stand;Stairs;Squat;Sleep;Locomotion Level;Transfers;Bend    Examination-Participation Restrictions Cleaning;Community Activity;Driving;Laundry;Shop    Stability/Clinical Decision Making Evolving/Moderate complexity    Rehab Potential Good    PT Frequency 4x / week   4x/wk x 1 wk, then 1-2 x/wk x 3 wks   PT Duration 4 weeks    PT Treatment/Interventions ADLs/Self Care Home Management;Cryotherapy;Electrical Stimulation;Iontophoresis 4mg /ml Dexamethasone;Moist Heat;Ultrasound;Gait training;Therapeutic activities;Therapeutic exercise;Neuromuscular re-education;Balance training;Manual techniques;Passive range of motion;Dry needling;Joint Manipulations;Vasopneumatic Device;Taping;DME Instruction;Stair training;Functional mobility training;Patient/family education;Scar mobilization    PT Next Visit Plan Flexion AROM and quadriceps strength, vaso    PT Home Exercise Plan Access Code: [email or web address]    Consulted and Agree with Plan of Care Patient    Family Member Consulted interpreter present             Patient will benefit from skilled therapeutic intervention in order to improve the following deficits and impairments:  Decreased activity tolerance, Decreased balance, Decreased coordination, Decreased endurance, Decreased mobility, Decreased range of motion, Decreased strength, Difficulty walking, Impaired flexibility, Increased edema, Pain  Visit Diagnosis: Difficulty in walking, not elsewhere classified  Muscle weakness (generalized)  Localized edema  Chronic pain of right knee  Stiffness of right knee, not elsewhere classified     Problem List Patient Active Problem List   Diagnosis Date Noted   Status post total right knee replacement 10/10/2020   Vitamin D deficiency 04/24/2020   Chronic RUQ pain 01/14/2020   Hyperlipemia 08/23/2019   ICH (intracerebral hemorrhage) ([place]) L parietal w/ L SDH s/p crani 08/09/2019   Healthcare  maintenance 06/20/2019   History of strabismus surgery 03/31/2019   Moderate, recurrent MDD 06/06/2018   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 [place], [place], [address] Phone: [phone]   Fax:  [phone]  Name: Michelle Carrillo MRN: [id number] Date of Birth: Aug 28, 1950

## 2021-01-01 ENCOUNTER — Other Ambulatory Visit: Payer: Self-pay | Admitting: Internal Medicine

## 2021-01-01 ENCOUNTER — Encounter: Payer: Medicare Other | Admitting: Physical Therapy

## 2021-01-01 ENCOUNTER — Encounter: Payer: Medicare Other | Admitting: Rehabilitative and Restorative Service Providers"

## 2021-01-01 DIAGNOSIS — F331 Major depressive disorder, recurrent, moderate: Secondary | ICD-10-CM

## 2021-01-01 DIAGNOSIS — I10 Essential (primary) hypertension: Secondary | ICD-10-CM

## 2021-01-01 NOTE — Telephone Encounter (Signed)
Please arrange an office with for her with Dr. Lorin Glass in November. Thanks

## 2021-01-02 ENCOUNTER — Encounter: Payer: Medicare Other | Admitting: Rehabilitative and Restorative Service Providers"

## 2021-01-02 DIAGNOSIS — I69398 Other sequelae of cerebral infarction: Secondary | ICD-10-CM | POA: Diagnosis not present

## 2021-01-02 DIAGNOSIS — H539 Unspecified visual disturbance: Secondary | ICD-10-CM | POA: Diagnosis not present

## 2021-01-02 DIAGNOSIS — H2511 Age-related nuclear cataract, right eye: Secondary | ICD-10-CM | POA: Diagnosis not present

## 2021-01-02 DIAGNOSIS — H53461 Homonymous bilateral field defects, right side: Secondary | ICD-10-CM | POA: Diagnosis not present

## 2021-01-02 DIAGNOSIS — Z947 Corneal transplant status: Secondary | ICD-10-CM | POA: Diagnosis not present

## 2021-01-02 DIAGNOSIS — Z79899 Other long term (current) drug therapy: Secondary | ICD-10-CM | POA: Diagnosis not present

## 2021-01-02 NOTE — Telephone Encounter (Signed)
Paxil refill sent yesterday, but CMA unable to refuse refill Lisinopril due for refill  Will send to appropriate team for review.

## 2021-01-05 ENCOUNTER — Ambulatory Visit (INDEPENDENT_AMBULATORY_CARE_PROVIDER_SITE_OTHER): Payer: Medicare Other | Admitting: Physical Therapy

## 2021-01-05 ENCOUNTER — Other Ambulatory Visit: Payer: Self-pay

## 2021-01-05 ENCOUNTER — Encounter: Payer: Self-pay | Admitting: Physical Therapy

## 2021-01-05 DIAGNOSIS — G8929 Other chronic pain: Secondary | ICD-10-CM

## 2021-01-05 DIAGNOSIS — M25561 Pain in right knee: Secondary | ICD-10-CM

## 2021-01-05 DIAGNOSIS — R600 Localized edema: Secondary | ICD-10-CM | POA: Diagnosis not present

## 2021-01-05 DIAGNOSIS — R262 Difficulty in walking, not elsewhere classified: Secondary | ICD-10-CM | POA: Diagnosis not present

## 2021-01-05 DIAGNOSIS — M25661 Stiffness of right knee, not elsewhere classified: Secondary | ICD-10-CM

## 2021-01-05 DIAGNOSIS — M6281 Muscle weakness (generalized): Secondary | ICD-10-CM | POA: Diagnosis not present

## 2021-01-05 NOTE — Therapy (Signed)
[facility] [address] [place], [place], [address] Phone: [phone]   Fax:  [phone]  Physical Therapy Treatment  Patient Details  Name: Michelle Carrillo MRN: [id number] Date of Birth: 09/14/1950 Referring Provider (PT): Dollene Primrose   Encounter Date: 01/05/2021   PT End of Session - 01/05/21 1342     Visit Number 17    Number of Visits 21    Date for PT Re-Evaluation 01/23/21    Authorization Type MCR    Progress Note Due on Visit 24    PT Start Time 1302    PT Stop Time 1340    PT Time Calculation (min) 38 min    Activity Tolerance Patient tolerated treatment well    Behavior During Therapy [facility] for tasks assessed/performed             Past Medical History:  Diagnosis Date   Anxiety    on meds   Arthritis    bilateral knees   Cataract    RIGHT eye   Depression    on meds   Herpes zoster kerstitis s/p corneal transplant 2015    Hypertension    on meds   Right knee osteoarthritis    Strabismus    due to eye injury as a child   Stroke ([place]) 07/2019   stroke with craniotomy    Past Surgical History:  Procedure Laterality Date   CESAREAN SECTION      x 2    CHOLECYSTECTOMY     COLONOSCOPY  04/22/2020   CORNEAL TRANSPLANT Left    2006 and 2016   CRANIOTOMY Left 08/09/2019   Procedure: CRANIOTOMY HEMATOMA EVACUATION SUBDURAL;  Surgeon: Ashok Pall, MD;  Location: [place];  Service: Neurosurgery;  Laterality: Left;  left   EYE SURGERY     TOTAL KNEE ARTHROPLASTY Right 10/10/2020   Procedure: RIGHT TOTAL KNEE ARTHROPLASTY;  Surgeon: Leandrew Koyanagi, MD;  Location: [place];  Service: Orthopedics;  Laterality: Right;   TUBAL LIGATION      There were no vitals filed for this visit.   Subjective Assessment - 01/05/21 1308     Subjective knee is "going."  rates pain 5/10. had eye appt on friday which is why she missed the appt    Patient is accompained by: Interpreter   [name]   Pertinent History Rt TKA,CVA     Limitations Standing;Walking;House hold activities    How long can you sit comfortably? Stiffness after prolonged sitting    How long can you stand comfortably? 30 minutes    How long can you walk comfortably? A block without her cane    Patient Stated Goals Reduce pain and improve flexion AROM    Currently in Pain? Yes    Pain Score 5     Pain Location Knee    Pain Orientation Right    Pain Descriptors / Indicators Tightness;Sore    Pain Type Chronic pain;Surgical pain    Pain Onset More than a month ago    Pain Frequency Constant    Aggravating Factors  walking, standing    Pain Relieving Factors rest, ice                [facility] PT Assessment - 01/05/21 1334       Assessment   Medical Diagnosis Rt TKA 10/10/20    Referring Provider (PT) Erlinda Hong, N      AROM   Right Knee Flexion 102      PROM   Right  Knee Flexion 115                           [facility] Adult PT Treatment/Exercise - 01/05/21 1309       Knee/Hip Exercises: Stretches   Other Knee/Hip Stretches Tailgate knee flexion AROM 2X 1 minute    Other Knee/Hip Stretches AAROM knee flexion (L pushes R into flexion) 15X 10 seconds      Knee/Hip Exercises: Aerobic   Nustep L6 x 8 min      Knee/Hip Exercises: Machines for Strengthening   Total Gym Leg Press 106# 3x10      Knee/Hip Exercises: Supine   Heel Slides AAROM;Right;10 reps    Heel Slides Limitations with strap, 10 sec hold      Manual Therapy   Manual therapy comments seated Rt knee flexion to tolerance                       PT Short Term Goals - 12/10/20 1630       PT SHORT TERM GOAL #1   Title Pt will be Independent and compliant with initial HEP    Status Achieved               PT Long Term Goals - 12/29/20 1447       PT LONG TERM GOAL #1   Title Patient will be independent with final progressive HEP for strengthening/balance (ALL LTGS DUE: 01/23/21)    Time 4    Period Weeks    Status On-going    Target  Date 01/23/21      PT LONG TERM GOAL #2   Title Patient reports ambulating & ADLs in home without device and community with single point cane.    Time 4    Period Weeks    Status Revised    Target Date 01/23/21      PT LONG TERM GOAL #3   Title She will improve Rt knee PROM 0-115 degreees to improve funciton.    Baseline --    Time 4    Period Weeks    Status Revised    Target Date 01/23/21      PT LONG TERM GOAL #4   Title She will improve Rt hip/knee strength to 5/5 MMT tested in sitting.    Baseline --    Time 4    Period Weeks    Status On-going    Target Date 01/23/21      PT LONG TERM GOAL #5   Title She will have overall less than 3/10 pain with general activity.    Baseline --    Time 4    Period Weeks    Status On-going    Target Date 01/23/21                   Plan - 01/05/21 1342     Clinical Impression Statement Pt tolerated session well today with AROM the same as last visit, but PROM improved to 115 deg today. Will continue to work on AROM and strengthening.    Personal Factors and Comorbidities Comorbidity 1    Comorbidities CVA with decreased peripheral vision and memory    Examination-Activity Limitations Carry;Lift;Stand;Stairs;Squat;Sleep;Locomotion Level;Transfers;Bend    Examination-Participation Restrictions Cleaning;Community Activity;Driving;Laundry;Shop    Stability/Clinical Decision Making Evolving/Moderate complexity    Rehab Potential Good    PT Frequency 4x / week   4x/wk x 1 wk, then 1-2 x/wk x  3 wks   PT Duration 4 weeks    PT Treatment/Interventions ADLs/Self Care Home Management;Cryotherapy;Electrical Stimulation;Iontophoresis 4mg /ml Dexamethasone;Moist Heat;Ultrasound;Gait training;Therapeutic activities;Therapeutic exercise;Neuromuscular re-education;Balance training;Manual techniques;Passive range of motion;Dry needling;Joint Manipulations;Vasopneumatic Device;Taping;DME Instruction;Stair training;Functional mobility  training;Patient/family education;Scar mobilization    PT Next Visit Plan Flexion AROM and quadriceps strength, vaso    PT Home Exercise Plan Access Code: [email or web address]    Consulted and Agree with Plan of Care Patient    Family Member Consulted interpreter present             Patient will benefit from skilled therapeutic intervention in order to improve the following deficits and impairments:  Decreased activity tolerance, Decreased balance, Decreased coordination, Decreased endurance, Decreased mobility, Decreased range of motion, Decreased strength, Difficulty walking, Impaired flexibility, Increased edema, Pain  Visit Diagnosis: Difficulty in walking, not elsewhere classified  Muscle weakness (generalized)  Localized edema  Chronic pain of right knee  Stiffness of right knee, not elsewhere classified     Problem List Patient Active Problem List   Diagnosis Date Noted   Status post total right knee replacement 10/10/2020   Vitamin D deficiency 04/24/2020   Chronic RUQ pain 01/14/2020   Hyperlipemia 08/23/2019   ICH (intracerebral hemorrhage) ([place]) L parietal w/ L SDH s/p crani 08/09/2019   Healthcare maintenance 06/20/2019   History of strabismus surgery 03/31/2019   Moderate, recurrent MDD 06/06/2018   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

## 2021-01-07 ENCOUNTER — Other Ambulatory Visit: Payer: Self-pay

## 2021-01-07 ENCOUNTER — Encounter: Payer: Self-pay | Admitting: Physical Therapy

## 2021-01-07 ENCOUNTER — Ambulatory Visit (INDEPENDENT_AMBULATORY_CARE_PROVIDER_SITE_OTHER): Payer: Medicare Other | Admitting: Physical Therapy

## 2021-01-07 DIAGNOSIS — R600 Localized edema: Secondary | ICD-10-CM

## 2021-01-07 DIAGNOSIS — R262 Difficulty in walking, not elsewhere classified: Secondary | ICD-10-CM | POA: Diagnosis not present

## 2021-01-07 DIAGNOSIS — M25561 Pain in right knee: Secondary | ICD-10-CM | POA: Diagnosis not present

## 2021-01-07 DIAGNOSIS — M6281 Muscle weakness (generalized): Secondary | ICD-10-CM | POA: Diagnosis not present

## 2021-01-07 DIAGNOSIS — G8929 Other chronic pain: Secondary | ICD-10-CM | POA: Diagnosis not present

## 2021-01-07 DIAGNOSIS — M25661 Stiffness of right knee, not elsewhere classified: Secondary | ICD-10-CM | POA: Diagnosis not present

## 2021-01-07 NOTE — Patient Instructions (Signed)
EXERCISES need to work on  [place] like recumbent bike or stepper and walking Strength can use machines at [employer] and PT HEP Flexibility - PT HEP and stretches Balance - PT HEP  Endurance & strength should be done 3-4 x/wk Flexibility & Balance should be done daily. You can do during tv commercials or if standing in kitchen

## 2021-01-07 NOTE — Therapy (Signed)
[facility] [address] [place], [place], [address] Phone: [phone]   Fax:  [phone]  Physical Therapy Treatment  Patient Details  Name: Michelle Carrillo MRN: [id number] Date of Birth: 12-09-1950 Referring Provider (PT): Dollene Primrose   Encounter Date: 01/07/2021   PT End of Session - 01/07/21 1256     Visit Number 18    Number of Visits 23    Date for PT Re-Evaluation 01/23/21    Authorization Type MCR    Progress Note Due on Visit 24    PT Start Time 7517    PT Stop Time 1346    PT Time Calculation (min) 50 min    Activity Tolerance Patient tolerated treatment well    Behavior During Therapy [facility] for tasks assessed/performed             Past Medical History:  Diagnosis Date   Anxiety    on meds   Arthritis    bilateral knees   Cataract    RIGHT eye   Depression    on meds   Herpes zoster kerstitis s/p corneal transplant 2015    Hypertension    on meds   Right knee osteoarthritis    Strabismus    due to eye injury as a child   Stroke ([place]) 07/2019   stroke with craniotomy    Past Surgical History:  Procedure Laterality Date   CESAREAN SECTION      x 2    CHOLECYSTECTOMY     COLONOSCOPY  04/22/2020   CORNEAL TRANSPLANT Left    2006 and 2016   CRANIOTOMY Left 08/09/2019   Procedure: CRANIOTOMY HEMATOMA EVACUATION SUBDURAL;  Surgeon: Ashok Pall, MD;  Location: [place];  Service: Neurosurgery;  Laterality: Left;  left   EYE SURGERY     TOTAL KNEE ARTHROPLASTY Right 10/10/2020   Procedure: RIGHT TOTAL KNEE ARTHROPLASTY;  Surgeon: Leandrew Koyanagi, MD;  Location: [place];  Service: Orthopedics;  Laterality: Right;   TUBAL LIGATION      There were no vitals filed for this visit.   Subjective Assessment - 01/07/21 1257     Subjective She has been doing her exercises.    Patient is accompained by: Interpreter   [name]   Pertinent History Rt TKA,CVA    Limitations Standing;Walking;House hold activities    How long  can you sit comfortably? Stiffness after prolonged sitting    How long can you stand comfortably? 30 minutes    How long can you walk comfortably? A block without her cane    Patient Stated Goals Reduce pain and improve flexion AROM    Currently in Pain? Yes    Pain Score 2     Pain Location Knee    Pain Orientation Right    Pain Descriptors / Indicators Tightness;Sore    Pain Type Acute pain    Pain Onset More than a month ago    Pain Frequency Constant    Aggravating Factors  walking & standing    Pain Relieving Factors rest, ice                               [facility] Adult PT Treatment/Exercise - 01/07/21 1257       Ambulation/Gait   Ambulation/Gait Yes    Ambulation/Gait Assistance 5: Supervision    Assistive device None      Knee/Hip Exercises: Stretches   Other Knee/Hip Stretches --  Other Knee/Hip Stretches --      Knee/Hip Exercises: Aerobic   Nustep L6 with LEs only x 12 min seat 4   PT educated on exercising while on Nustep -see pt education     Knee/Hip Exercises: Machines for Strengthening   Cybex Knee Extension 10# RLE only 15 reps 2 sets    Cybex Knee Flexion BLEs 25# 15 reps 2 sets    Total Gym Leg Press 112# 15reps 2 sets      Knee/Hip Exercises: Standing   Heel Raises Both;1 set;10 reps;2 seconds    Heel Raises Limitations alternating heel raises & toe raises near sink with goal to not touch    Knee Flexion AROM;Strengthening;Right;15 reps;2 sets    Knee Flexion Limitations PT demo & verbal cues on technique    Other Standing Knee Exercises tandem stance RLE forward & RLE backward counting to 30 stopping if touches sink.      Knee/Hip Exercises: Seated   Sit to Sand 10 reps;without UE support      Knee/Hip Exercises: Supine   Heel Slides --    Heel Slides Limitations --      Manual Therapy   Manual therapy comments seated Rt knee flexion to tolerance                     PT Education - 01/07/21 1506     Education  Details see pt instructions - educated on ongoing HEP, types of exercises and what she can do if goes to [employer] with children.    Person(s) Educated Patient;Child(ren)    Methods Explanation;Demonstration;Verbal cues    Comprehension Verbalized understanding;Need further instruction              PT Short Term Goals - 12/10/20 1630       PT SHORT TERM GOAL #1   Title Pt will be Independent and compliant with initial HEP    Status Achieved               PT Long Term Goals - 12/29/20 1447       PT LONG TERM GOAL #1   Title Patient will be independent with final progressive HEP for strengthening/balance (ALL LTGS DUE: 01/23/21)    Time 4    Period Weeks    Status On-going    Target Date 01/23/21      PT LONG TERM GOAL #2   Title Patient reports ambulating & ADLs in home without device and community with single point cane.    Time 4    Period Weeks    Status Revised    Target Date 01/23/21      PT LONG TERM GOAL #3   Title She will improve Rt knee PROM 0-115 degreees to improve funciton.    Baseline --    Time 4    Period Weeks    Status Revised    Target Date 01/23/21      PT LONG TERM GOAL #4   Title She will improve Rt hip/knee strength to 5/5 MMT tested in sitting.    Baseline --    Time 4    Period Weeks    Status On-going    Target Date 01/23/21      PT LONG TERM GOAL #5   Title She will have overall less than 3/10 pain with general activity.    Baseline --    Time 4    Period Weeks    Status On-going  Target Date 01/23/21                   Plan - 01/07/21 1256     Clinical Impression Statement PT educated pt & her dtr on ongoing need for exercises and types of exercise.  Patient would benefit from her dtr attending PT sessions to improve carryover for home due to some cognitive limitations from previous [place].  Pt & dtr agree with this.  PT added tandem balance, SLS and heel / toe raises in front of sinke for balance and needs  to be added to HEP.    Personal Factors and Comorbidities Comorbidity 1    Comorbidities CVA with decreased peripheral vision and memory    Examination-Activity Limitations Carry;Lift;Stand;Stairs;Squat;Sleep;Locomotion Level;Transfers;Bend    Examination-Participation Restrictions Cleaning;Community Activity;Driving;Laundry;Shop    Stability/Clinical Decision Making Evolving/Moderate complexity    Rehab Potential Good    PT Frequency 4x / week   4x/wk x 1 wk, then 1-2 x/wk x 3 wks   PT Duration 4 weeks    PT Treatment/Interventions ADLs/Self Care Home Management;Cryotherapy;Electrical Stimulation;Iontophoresis 4mg /ml Dexamethasone;Moist Heat;Ultrasound;Gait training;Therapeutic activities;Therapeutic exercise;Neuromuscular re-education;Balance training;Manual techniques;Passive range of motion;Dry needling;Joint Manipulations;Vasopneumatic Device;Taping;DME Instruction;Stair training;Functional mobility training;Patient/family education;Scar mobilization    PT Next Visit Plan continue Flexion AROM and quadriceps strength, add balance activities to HEP and update HEP along with instruction in ongoing need to exercise,  vaso prn    PT Home Exercise Plan Access Code: [email or web address]    Consulted and Agree with Plan of Care Patient;Family member/caregiver    Family Member Consulted interpreter present and dtr, [place]             Patient will benefit from skilled therapeutic intervention in order to improve the following deficits and impairments:  Decreased activity tolerance, Decreased balance, Decreased coordination, Decreased endurance, Decreased mobility, Decreased range of motion, Decreased strength, Difficulty walking, Impaired flexibility, Increased edema, Pain  Visit Diagnosis: Muscle weakness (generalized)  Difficulty in walking, not elsewhere classified  Localized edema  Chronic pain of right knee  Stiffness of right knee, not elsewhere classified     Problem List Patient Active  Problem List   Diagnosis Date Noted   Status post total right knee replacement 10/10/2020   Vitamin D deficiency 04/24/2020   Chronic RUQ pain 01/14/2020   Hyperlipemia 08/23/2019   ICH (intracerebral hemorrhage) ([place]) L parietal w/ L SDH s/p crani 08/09/2019   Healthcare maintenance 06/20/2019   History of strabismus surgery 03/31/2019   Moderate, recurrent MDD 06/06/2018   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 [place], [place], [address] Phone: [phone]   Fax:  [phone]  Name: Michelle Carrillo MRN: [id number] Date of Birth: 1951/03/07

## 2021-01-08 ENCOUNTER — Encounter: Payer: Self-pay | Admitting: Orthopaedic Surgery

## 2021-01-08 ENCOUNTER — Ambulatory Visit (INDEPENDENT_AMBULATORY_CARE_PROVIDER_SITE_OTHER): Payer: Medicare Other | Admitting: Orthopaedic Surgery

## 2021-01-08 DIAGNOSIS — Z96651 Presence of right artificial knee joint: Secondary | ICD-10-CM

## 2021-01-08 NOTE — Progress Notes (Signed)
Post-Op Visit Note   Patient: Michelle Carrillo           Date of Birth: 04/09/1951           MRN: [id number] Visit Date: 01/08/2021 PCP: Orvis Brill, MD   Assessment & Plan:  Chief Complaint:  Chief Complaint  Patient presents with   Right Knee - Pain   Visit Diagnoses:  1. Status post total right knee replacement     Plan: [name] is a 2-week status post manipulation of the right knee for stiffness status post total knee replacement on 10/10/2020.  She feels better overall.  The flexion has not improved and she is currently doing physical therapy upstairs.  She is doing the CPM at home.  No real complaints today.  At this point she will continue to work aggressively at improve range of motion.  Recheck in 6 weeks.  Follow-Up Instructions: Return in about 6 weeks (around 02/19/2021).   Orders:  No orders of the defined types were placed in this encounter.  No orders of the defined types were placed in this encounter.   Imaging: No results found.  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 06/06/2018   Primary osteoarthritis of right knee 06/05/2018   Essential hypertension 06/05/2018   Anxiety 06/05/2018   Age-related nuclear cataract of both eyes 11/26/2014   Diplopia 11/26/2014   Monocular esotropia of left eye 11/26/2014   Past Medical History:  Diagnosis Date   Anxiety    on meds   Arthritis    bilateral knees   Cataract    RIGHT eye   Depression    on meds   Herpes zoster kerstitis s/p corneal transplant 2015     Hypertension    on meds   Right knee osteoarthritis    Strabismus    due to eye injury as a child   Stroke ([place]) 07/2019   stroke with craniotomy    Family History  Problem Relation Age of Onset   Diabetes Mother    Ovarian cancer Mother    High blood pressure Sister    Diabetes Sister    Diabetes Brother    Stomach cancer Maternal Grandmother 28   Colon polyps Neg Hx    Colon cancer Neg Hx    Rectal cancer Neg Hx    Esophageal cancer Neg Hx     Past Surgical History:  Procedure Laterality Date   CESAREAN SECTION      x 2    CHOLECYSTECTOMY     COLONOSCOPY  04/22/2020   CORNEAL TRANSPLANT Left    2006 and 2016   CRANIOTOMY Left 08/09/2019   Procedure: CRANIOTOMY HEMATOMA EVACUATION SUBDURAL;  Surgeon: Ashok Pall, MD;  Location: [place];  Service: Neurosurgery;  Laterality: Left;  left   EYE SURGERY     TOTAL KNEE ARTHROPLASTY Right 10/10/2020   Procedure: RIGHT TOTAL KNEE ARTHROPLASTY;  Surgeon: Leandrew Koyanagi, MD;  Location: [place];  Service: Orthopedics;  Laterality: Right;   TUBAL LIGATION     Social History   Occupational  History   Not on file  Tobacco Use   Smoking status: Never   Smokeless tobacco: Never  Vaping Use   Vaping Use: Never used  Substance and Sexual Activity   Alcohol use: Never   Drug use: Never   Sexual activity: Not on file

## 2021-01-13 ENCOUNTER — Ambulatory Visit (INDEPENDENT_AMBULATORY_CARE_PROVIDER_SITE_OTHER): Payer: Medicare Other | Admitting: Rehabilitative and Restorative Service Providers"

## 2021-01-13 ENCOUNTER — Encounter: Payer: Self-pay | Admitting: Rehabilitative and Restorative Service Providers"

## 2021-01-13 ENCOUNTER — Other Ambulatory Visit: Payer: Self-pay

## 2021-01-13 DIAGNOSIS — M25561 Pain in right knee: Secondary | ICD-10-CM

## 2021-01-13 DIAGNOSIS — R600 Localized edema: Secondary | ICD-10-CM | POA: Diagnosis not present

## 2021-01-13 DIAGNOSIS — R262 Difficulty in walking, not elsewhere classified: Secondary | ICD-10-CM | POA: Diagnosis not present

## 2021-01-13 DIAGNOSIS — M6281 Muscle weakness (generalized): Secondary | ICD-10-CM | POA: Diagnosis not present

## 2021-01-13 DIAGNOSIS — M25661 Stiffness of right knee, not elsewhere classified: Secondary | ICD-10-CM

## 2021-01-13 DIAGNOSIS — G8929 Other chronic pain: Secondary | ICD-10-CM

## 2021-01-13 NOTE — Therapy (Signed)
[facility] [address] [place], [place], [address] Phone: [phone]   Fax:  [phone]  Physical Therapy Treatment  Patient Details  Name: Michelle Carrillo MRN: [id number] Date of Birth: January 09, 1951 Referring Provider (PT): Dollene Primrose   Encounter Date: 01/13/2021   PT End of Session - 01/13/21 1439     Visit Number 19    Number of Visits 23    Date for PT Re-Evaluation 01/23/21    Authorization Type MCR c KX modifier    Progress Note Due on Visit 24    PT Start Time 1438    PT Stop Time 2800    PT Time Calculation (min) 38 min    Activity Tolerance Patient tolerated treatment well    Behavior During Therapy [facility] for tasks assessed/performed             Past Medical History:  Diagnosis Date   Anxiety    on meds   Arthritis    bilateral knees   Cataract    RIGHT eye   Depression    on meds   Herpes zoster kerstitis s/p corneal transplant 2015    Hypertension    on meds   Right knee osteoarthritis    Strabismus    due to eye injury as a child   Stroke ([place]) 07/2019   stroke with craniotomy    Past Surgical History:  Procedure Laterality Date   CESAREAN SECTION      x 2    CHOLECYSTECTOMY     COLONOSCOPY  04/22/2020   CORNEAL TRANSPLANT Left    2006 and 2016   CRANIOTOMY Left 08/09/2019   Procedure: CRANIOTOMY HEMATOMA EVACUATION SUBDURAL;  Surgeon: Ashok Pall, MD;  Location[place];  Service: Neurosurgery;  Laterality: Left;  left   EYE SURGERY     TOTAL KNEE ARTHROPLASTY Right 10/10/2020   Procedure: RIGHT TOTAL KNEE ARTHROPLASTY;  Surgeon: Leandrew Koyanagi, MD;  Location: [place];  Service: Orthopedics;  Laterality: Right;   TUBAL LIGATION      There were no vitals filed for this visit.   Subjective Assessment - 01/13/21 1449     Subjective Pt. indicated doing better overall. A little small amount of pain noted today upon arrival.    Patient is accompained by: Interpreter   [name]   Pertinent History Rt  TKA,CVA    Limitations Standing;Walking;House hold activities    Patient Stated Goals Reduce pain and improve flexion AROM    Currently in Pain? Yes    Pain Score 1    "a little"   Pain Location Knee    Pain Orientation Right    Pain Descriptors / Indicators Tightness;Sore    Pain Type Surgical pain    Pain Onset More than a month ago    Pain Frequency Intermittent    Aggravating Factors  end range bending tightness    Pain Relieving Factors rest                [facility] PT Assessment - 01/13/21 0001       Assessment   Medical Diagnosis Rt TKA 10/10/20    Referring Provider (PT) Erlinda Hong, Delane Ginger    Onset Date/Surgical Date 10/10/20    Hand Dominance Right      Ambulation/Gait   Gait Comments Independent ambulation safely                           [facility] Adult PT Treatment/Exercise -  01/13/21 0001       Knee/Hip Exercises: Aerobic   Recumbent Bike Partial revolutions c cues for 5 mins, fwd revolutions for 5 mins lvl 1 seat 1 (Pt. requested bike today)      Knee/Hip Exercises: Machines for Strengthening   Cybex Knee Extension Rt leg eccentric lowering only 3 x 10 10 lbs    Cybex Knee Flexion Rt leg only 10 lbs 3 x 10    Total Gym Leg Press 112# 15reps 2 sets      Knee/Hip Exercises: Standing   Heel Raises 20 reps;Both    Heel Raises Limitations alt toe/heel raises    Forward Step Up Step Height: 4";Hand Hold: 1;2 sets;10 reps;Right      Knee/Hip Exercises: Seated   Sit to Sand 15 reps;without UE support   18 inch chair, quick up, slow sit                      PT Short Term Goals - 12/10/20 1630       PT SHORT TERM GOAL #1   Title Pt will be Independent and compliant with initial HEP    Status Achieved               PT Long Term Goals - 12/29/20 1447       PT LONG TERM GOAL #1   Title Patient will be independent with final progressive HEP for strengthening/balance (ALL LTGS DUE: 01/23/21)    Time 4    Period Weeks    Status On-going     Target Date 01/23/21      PT LONG TERM GOAL #2   Title Patient reports ambulating & ADLs in home without device and community with single point cane.    Time 4    Period Weeks    Status Revised    Target Date 01/23/21      PT LONG TERM GOAL #3   Title She will improve Rt knee PROM 0-115 degreees to improve funciton.    Baseline --    Time 4    Period Weeks    Status Revised    Target Date 01/23/21      PT LONG TERM GOAL #4   Title She will improve Rt hip/knee strength to 5/5 MMT tested in sitting.    Baseline --    Time 4    Period Weeks    Status On-going    Target Date 01/23/21      PT LONG TERM GOAL #5   Title She will have overall less than 3/10 pain with general activity.    Baseline --    Time 4    Period Weeks    Status On-going    Target Date 01/23/21                   Plan - 01/13/21 1503     Clinical Impression Statement Consistent cues required throughout intervention to facilitate techniques and procedures for optimal use and benefit.  Independent ambulation in clinic noted today c safety.  Continued skilled PT services indicated for strengthening, balance and end range mobility gains.    Personal Factors and Comorbidities Comorbidity 1    Comorbidities CVA with decreased peripheral vision and memory    Examination-Activity Limitations Carry;Lift;Stand;Stairs;Squat;Sleep;Locomotion Level;Transfers;Bend    Examination-Participation Restrictions Cleaning;Community Activity;Driving;Laundry;Shop    Stability/Clinical Decision Making Evolving/Moderate complexity    Rehab Potential Good    PT Frequency 4x / week   4x/wk  x 1 wk, then 1-2 x/wk x 3 wks   PT Duration 4 weeks    PT Treatment/Interventions ADLs/Self Care Home Management;Cryotherapy;Electrical Stimulation;Iontophoresis 4mg /ml Dexamethasone;Moist Heat;Ultrasound;Gait training;Therapeutic activities;Therapeutic exercise;Neuromuscular re-education;Balance training;Manual techniques;Passive range  of motion;Dry needling;Joint Manipulations;Vasopneumatic Device;Taping;DME Instruction;Stair training;Functional mobility training;Patient/family education;Scar mobilization    PT Next Visit Plan Progress strengthening, compliant surface balance    PT Home Exercise Plan Access Code: [email or web address]    Consulted and Agree with Plan of Care Patient    Family Member Consulted --             Patient will benefit from skilled therapeutic intervention in order to improve the following deficits and impairments:  Decreased activity tolerance, Decreased balance, Decreased coordination, Decreased endurance, Decreased mobility, Decreased range of motion, Decreased strength, Difficulty walking, Impaired flexibility, Increased edema, Pain  Visit Diagnosis: Difficulty in walking, not elsewhere classified  Muscle weakness (generalized)  Localized edema  Chronic pain of right knee  Stiffness of right knee, not elsewhere classified     Problem List Patient Active Problem List   Diagnosis Date Noted   Status post total right knee replacement 10/10/2020   Vitamin D deficiency 04/24/2020   Chronic RUQ pain 01/14/2020   Hyperlipemia 08/23/2019   ICH (intracerebral hemorrhage) ([facility]) L parietal w/ L SDH s/p crani 08/09/2019   Healthcare maintenance 06/20/2019   History of strabismus surgery 03/31/2019   Moderate, recurrent MDD 06/06/2018   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 06/06/2018   Primary osteoarthritis of right knee 06/05/2018   Essential hypertension 06/05/2018   Anxiety 06/05/2018   Age-related nuclear cataract of both eyes 11/26/2014   Diplopia 11/26/2014   Monocular esotropia of left eye 11/26/2014    Scot Jun, PT, DPT, OCS, ATC 01/13/21  3:13 PM    [facility] [address] [place], [place], [address] Phone: [phone]   Fax:  [phone]  Name: LUVINIA LUCY MRN: [id number] Date of Birth:  Dec 23, 1950

## 2021-01-15 ENCOUNTER — Ambulatory Visit (INDEPENDENT_AMBULATORY_CARE_PROVIDER_SITE_OTHER): Payer: Medicare Other | Admitting: Physical Therapy

## 2021-01-15 ENCOUNTER — Other Ambulatory Visit: Payer: Self-pay

## 2021-01-15 ENCOUNTER — Encounter: Payer: Self-pay | Admitting: Physical Therapy

## 2021-01-15 DIAGNOSIS — G8929 Other chronic pain: Secondary | ICD-10-CM | POA: Diagnosis not present

## 2021-01-15 DIAGNOSIS — R600 Localized edema: Secondary | ICD-10-CM | POA: Diagnosis not present

## 2021-01-15 DIAGNOSIS — R262 Difficulty in walking, not elsewhere classified: Secondary | ICD-10-CM | POA: Diagnosis not present

## 2021-01-15 DIAGNOSIS — M6281 Muscle weakness (generalized): Secondary | ICD-10-CM | POA: Diagnosis not present

## 2021-01-15 DIAGNOSIS — M25561 Pain in right knee: Secondary | ICD-10-CM | POA: Diagnosis not present

## 2021-01-15 DIAGNOSIS — M25661 Stiffness of right knee, not elsewhere classified: Secondary | ICD-10-CM | POA: Diagnosis not present

## 2021-01-15 NOTE — Therapy (Signed)
[facility] [address] [place], [place], [address] Phone: [phone]   Fax:  [phone]  Physical Therapy Treatment  Patient Details  Name: Michelle Carrillo MRN: [id number] Date of Birth: 1951/01/02 Referring Provider (PT): Dollene Primrose   Encounter Date: 01/15/2021   PT End of Session - 01/15/21 1511     Visit Number 20    Number of Visits 23    Date for PT Re-Evaluation 01/23/21    Authorization Type MCR c KX modifier    Progress Note Due on Visit 24    PT Start Time 1438   pt arrived late   PT Stop Time 2197    PT Time Calculation (min) 33 min    Activity Tolerance Patient tolerated treatment well    Behavior During Therapy [facility] for tasks assessed/performed             Past Medical History:  Diagnosis Date   Anxiety    on meds   Arthritis    bilateral knees   Cataract    RIGHT eye   Depression    on meds   Herpes zoster kerstitis s/p corneal transplant 2015    Hypertension    on meds   Right knee osteoarthritis    Strabismus    due to eye injury as a child   Stroke ([place]) 07/2019   stroke with craniotomy    Past Surgical History:  Procedure Laterality Date   CESAREAN SECTION      x 2    CHOLECYSTECTOMY     COLONOSCOPY  04/22/2020   CORNEAL TRANSPLANT Left    2006 and 2016   CRANIOTOMY Left 08/09/2019   Procedure: CRANIOTOMY HEMATOMA EVACUATION SUBDURAL;  Surgeon: Ashok Pall, MD;  Location: [place];  Service: Neurosurgery;  Laterality: Left;  left   EYE SURGERY     TOTAL KNEE ARTHROPLASTY Right 10/10/2020   Procedure: RIGHT TOTAL KNEE ARTHROPLASTY;  Surgeon: Leandrew Koyanagi, MD;  Location: [place];  Service: Orthopedics;  Laterality: Right;   TUBAL LIGATION      There were no vitals filed for this visit.   Subjective Assessment - 01/15/21 1440     Subjective knee is "getting better"    Patient is accompained by: Interpreter   [name]   Pertinent History Rt TKA,CVA    Limitations Standing;Walking;House hold  activities    Patient Stated Goals Reduce pain and improve flexion AROM    Currently in Pain? Yes    Pain Score 3     Pain Location Knee    Pain Orientation Right    Pain Descriptors / Indicators Tightness;Sore    Pain Type Surgical pain    Pain Onset More than a month ago    Pain Frequency Intermittent    Aggravating Factors  end range bending    Pain Relieving Factors rest                [facility] PT Assessment - 01/15/21 1506       Assessment   Medical Diagnosis Rt TKA 10/10/20    Referring Provider (PT) Erlinda Hong, N    Onset Date/Surgical Date 10/10/20      AROM   Right Knee Extension 0    Right Knee Flexion 100      PROM   Right Knee Flexion 110                           [facility] Adult PT Treatment/Exercise -  01/15/21 1441       Knee/Hip Exercises: Aerobic   Nustep L6 x 10 min; UEs/LEs      Knee/Hip Exercises: Machines for Strengthening   Cybex Knee Extension Rt leg eccentric lowering only 3 x 10 10 lbs    Cybex Knee Flexion Rt leg only 10 lbs 3 x 10    Total Gym Leg Press 112# 20reps 2 sets                       PT Short Term Goals - 12/10/20 1630       PT SHORT TERM GOAL #1   Title Pt will be Independent and compliant with initial HEP    Status Achieved               PT Long Term Goals - 12/29/20 1447       PT LONG TERM GOAL #1   Title Patient will be independent with final progressive HEP for strengthening/balance (ALL LTGS DUE: 01/23/21)    Time 4    Period Weeks    Status On-going    Target Date 01/23/21      PT LONG TERM GOAL #2   Title Patient reports ambulating & ADLs in home without device and community with single point cane.    Time 4    Period Weeks    Status Revised    Target Date 01/23/21      PT LONG TERM GOAL #3   Title She will improve Rt knee PROM 0-115 degreees to improve funciton.    Baseline --    Time 4    Period Weeks    Status Revised    Target Date 01/23/21      PT LONG TERM GOAL #4    Title She will improve Rt hip/knee strength to 5/5 MMT tested in sitting.    Baseline --    Time 4    Period Weeks    Status On-going    Target Date 01/23/21      PT LONG TERM GOAL #5   Title She will have overall less than 3/10 pain with general activity.    Baseline --    Time 4    Period Weeks    Status On-going    Target Date 01/23/21                   Plan - 01/15/21 1511     Clinical Impression Statement Pt tolerated session well today with continued work on strengthening.  Cognition seems to be limiting progress somewhat at this time as she required constant cues in clinic and difficult to determine how much she's exercising at home.  Anticipate d/c next week.    Personal Factors and Comorbidities Comorbidity 1    Comorbidities CVA with decreased peripheral vision and memory    Examination-Activity Limitations Carry;Lift;Stand;Stairs;Squat;Sleep;Locomotion Level;Transfers;Bend    Examination-Participation Restrictions Cleaning;Community Activity;Driving;Laundry;Shop    Stability/Clinical Decision Making Evolving/Moderate complexity    Rehab Potential Good    PT Frequency 4x / week   4x/wk x 1 wk, then 1-2 x/wk x 3 wks   PT Duration 4 weeks    PT Treatment/Interventions ADLs/Self Care Home Management;Cryotherapy;Electrical Stimulation;Iontophoresis 4mg /ml Dexamethasone;Moist Heat;Ultrasound;Gait training;Therapeutic activities;Therapeutic exercise;Neuromuscular re-education;Balance training;Manual techniques;Passive range of motion;Dry needling;Joint Manipulations;Vasopneumatic Device;Taping;DME Instruction;Stair training;Functional mobility training;Patient/family education;Scar mobilization    PT Next Visit Plan begin checking goals, plan for d/c    PT Home Exercise Plan Access Code: [email or web address]  Consulted and Agree with Plan of Care Patient             Patient will benefit from skilled therapeutic intervention in order to improve the following deficits and  impairments:  Decreased activity tolerance, Decreased balance, Decreased coordination, Decreased endurance, Decreased mobility, Decreased range of motion, Decreased strength, Difficulty walking, Impaired flexibility, Increased edema, Pain  Visit Diagnosis: Difficulty in walking, not elsewhere classified  Muscle weakness (generalized)  Localized edema  Chronic pain of right knee  Stiffness of right knee, not elsewhere classified     Problem List Patient Active Problem List   Diagnosis Date Noted   Status post total right knee replacement 10/10/2020   Vitamin D deficiency 04/24/2020   Chronic RUQ pain 01/14/2020   Hyperlipemia 08/23/2019   ICH (intracerebral hemorrhage) ([place]) L parietal w/ L SDH s/p crani 08/09/2019   Healthcare maintenance 06/20/2019   History of strabismus surgery 03/31/2019   Moderate, recurrent MDD 06/06/2018   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 06/06/2018   Primary osteoarthritis of right knee 06/05/2018   Essential hypertension 06/05/2018   Anxiety 06/05/2018   Age-related nuclear cataract of both eyes 11/26/2014   Diplopia 11/26/2014   Monocular esotropia of left eye 11/26/2014     Laureen Abrahams, PT, DPT 01/15/21 3:17 PM    [facility] [address] [place], [place], [address] Phone: [phone]   Fax:  [phone]  Name: Michelle Carrillo MRN: [id number] Date of Birth: 1950-05-24

## 2021-01-19 ENCOUNTER — Ambulatory Visit (INDEPENDENT_AMBULATORY_CARE_PROVIDER_SITE_OTHER): Payer: Medicare Other | Admitting: Adult Health

## 2021-01-19 ENCOUNTER — Encounter: Payer: Self-pay | Admitting: Adult Health

## 2021-01-19 ENCOUNTER — Other Ambulatory Visit: Payer: Self-pay

## 2021-01-19 VITALS — BP 132/72 | HR 84 | Ht 59.0 in | Wt 163.0 lb

## 2021-01-19 DIAGNOSIS — E538 Deficiency of other specified B group vitamins: Secondary | ICD-10-CM

## 2021-01-19 DIAGNOSIS — I611 Nontraumatic intracerebral hemorrhage in hemisphere, cortical: Secondary | ICD-10-CM

## 2021-01-19 DIAGNOSIS — E785 Hyperlipidemia, unspecified: Secondary | ICD-10-CM

## 2021-01-19 DIAGNOSIS — R4701 Aphasia: Secondary | ICD-10-CM

## 2021-01-19 NOTE — Patient Instructions (Addendum)
Continue aspirin 81 mg daily  and atorvastatin 40mg  daily  for secondary stroke prevention  Continue to follow up with PCP regarding cholesterol and blood pressure management  Maintain strict control of hypertension with blood pressure goal below 130/90 and cholesterol with LDL cholesterol (bad cholesterol) goal below 70 mg/dL.   Please schedule EEG to rule out possible seizure or increased risk of seizures with history of ICH  Please ensure you follow up with your PCP regarding depression concerns which could be contributing to your fatigue and lack of motivation     Followup in the future with me in 6 months or call earlier if needed       Thank you for coming to see [place] at [facility]. I hope we have been able to provide you high quality care today.  You may receive a patient satisfaction survey over the next few weeks. We would appreciate your feedback and comments so that we may continue to improve ourselves and the health of our patients.

## 2021-01-19 NOTE — Progress Notes (Signed)
[facility] [address] [place]. [place] [address] [phone]       OFFICE FOLLOW-UP NOTE  [name] Date of Birth:  [birth year] Medical Record Number:  [id number]    Chief Complaint  Patient presents with   Follow-up    Rm 2 with daughter (acting as interpreter)- Pt reports she has been feeling better she has feeling anxious since her last visit.        HPI:   Today, 01/19/2021, returns for 50-month stroke follow-up accompanied by her daughter, Michelle Carrillo, who assists with interpretation.  Overall stable from stroke standpoint.  Residual right peripheral visual impairment stable without worsening.  Evaluated by neuro-ophthalmology per daughter request last month -confirmed right homonymous hemianopia and no treatment options available. She has not yet been seen by her primary ophthalmologist. Continues to struggle with depression and lack of motivation. No additional aphasia episodes - EEG not yet completed.  Carotid duplex unremarkable.  Denies new stroke/TIA symptoms. Remains on aspirin and atorvastatin without side effects.  Blood pressure today 132/72. S/p R TKR 10/10/2020 by Dr. Erlinda Carrillo currently working with PT. She complains of multiple other areas of pain (elbow and shoulders) .  No further concerns at this time.     History provided for reference purposes only Update 07/17/2020 Michelle Carrillo: Pt returns for scheduled 51-month stroke follow-up accompanied by her daughter, Michelle Carrillo, who is a [facility] interpreter   She reports continued visual impairment, "being clumsy" and cognitive impairment with some improvement since prior visit Recently working with OT/SLP but daughter reports this has been completed Since stopping, patient does not do any of the exercises as daughter reports she is very unmotivated Episode on 07/13/2020 (evaluated in ED) consisted of word finding difficulty that lasted "just a few seconds" per patient but per ED notes, lasted approx 3 minutes.  No  other associated symptoms such as weakness, numbness/tingling, confusion, altered mental status or loss of consciousness.  Neuro consult who felt possibly more related to seizure activity especially with history of ICH. MRI negative for acute infarct.  Recommended EEG outpatient.  No additional events since that time.  Previously on atorvastatin but per daughter, PCP recommended discontinuing and recommended diet modification  Blood pressure 121/68 -routinely monitored at home which has been stable  No further concerns at this time  Update 01/14/2020 Michelle Carrillo: Michelle Carrillo returns for 77-month stroke follow-up accompanied by her daughter who assists with interpretation. Does report continued cognitive impairment such as difficulty concentrating and short-term memory loss and occasional word finding difficulty. Completed HH SLP - daughter questioning if additional outpatient therapy would be beneficial. She continues to live with her husband and 2 daughters. Maintains ADLs independently but does need assistance with IADLs such as bill paying and cooking.  Daughter reports visual concerns post stroke such as bumping into different objects currently being followed by ophthalmology with extensive ophthalmologic history.  Denies diplopia or blurred vision but possibly right peripheral impairment.  Denies new or worsening stroke/TIA symptoms.  Repeat CT head showed resolution of prior ICH.  EEG showed no evidence of seizure activity.  Keppra discontinued after slow titration and denies seizure activity or symptoms.  Blood pressure today 132/68. Monitors at home which has been stable.  Continues to have issues with depression/anxiety currently being managed by PCP.  No further concerns at this time.  Initial visit 10/09/2019 Dr. Phillips Carrillo. Michelle Carrillo is a pleasant 71 year old Hispanic lady who is accompanied today by her daughter as well as Spanish language interpreter Michelle Carrillo who  translates for her.  History is  obtained from them, review of electronic medical records and I personally reviewed imaging films in PACS.  She has past medical history of hypertension, anxiety and depression who presented to [facility] emergency room on 08/09/2019 with altered mental status.  CT scan of the head showed a large moderate size cortically based hematoma in the left parietal region with extension into the subdural space resulting in a fair sized subdural hematoma with left-to-right midline shift.  She had neurological worsening on exam requiring emergent neurosurgical consultation and patient underwent craniotomy for evacuation of the subdural hematoma.  She was kept in the intensive care unit and blood pressure was tightly controlled.  She was started on hypertonic saline.  Follow-up CT scan showed gradual improvement of her rightward midline shift from 7 mm down.  2D echo showed normal ejection fraction without cardiac source of embolism.  EEG shows no seizure activity but she was started on Keppra for seizure prophylaxis.  Repeat CT on the day of discharge on 08/23/2019 showed reduction in midline shift from 11 to 8 mm.  Patient was found to have mild aphasia on exam but otherwise no focal deficits.  She was transferred to inpatient rehab to [facility] in [place] as there was no beds available at [facility] inpatient rehab.  Patient was there for 3 weeks and is presently living at home with her daughter.  Her speech is mostly improved though she has some word hesitancy and forgets occasional words.  She is mostly fluent when she speaks.  Her understanding is also improving though it is not back to normal.  The patient's blood pressure has been well controlled and today it is 140/76.  She complains of some intermittent dizziness as well as anxiety.  She complains of some tingling on her head on the surgical site.  This is intermittent and not very bothersome.  She is currently getting home speech therapy.  She was started on  Zoloft 25 mg by primary care physician recently but daughter feels it does not help and perhaps may have caused new side effects.    ROS:   14 system review of systems is positive for those listed in HPI and all other systems negative  PMH:  Past Medical History:  Diagnosis Date   Anxiety    on meds   Arthritis    bilateral knees   Cataract    RIGHT eye   Depression    on meds   Herpes zoster kerstitis s/p corneal transplant 2015    Hypertension    on meds   Right knee osteoarthritis    Strabismus    due to eye injury as a child   Stroke ([place]) 07/2019   stroke with craniotomy    Social History:  Social History   Socioeconomic History   Marital status: Married    Spouse name: Not on file   Number of children: Not on file   Years of education: Not on file   Highest education level: Not on file  Occupational History   Not on file  Tobacco Use   Smoking status: Never   Smokeless tobacco: Never  Vaping Use   Vaping Use: Never used  Substance and Sexual Activity   Alcohol use: Never   Drug use: Never   Sexual activity: Not on file  Other Topics Concern   Not on file  Social History Narrative   Not on file   Social Determinants of Health  Financial Resource Strain: Not on file  Food Insecurity: Not on file  Transportation Needs: Not on file  Physical Activity: Not on file  Stress: Not on file  Social Connections: Not on file  Intimate Partner Violence: Not on file    Medications:   Current Outpatient Medications on File Prior to Visit  Medication Sig Dispense Refill   amLODipine (NORVASC) 10 MG tablet Take 1 tablet by mouth once daily 90 tablet 0   aspirin EC 81 MG tablet Take 1 tablet (81 mg total) by mouth 2 (two) times daily. To be taken after surgery 84 tablet 0   atorvastatin (LIPITOR) 40 MG tablet Take 1 tablet (40 mg total) by mouth daily. 90 tablet 3   docusate sodium (COLACE) 100 MG capsule Take 1 capsule (100 mg total) by mouth daily as  needed. 30 capsule 2   HYDROcodone-acetaminophen (NORCO) 5-325 MG tablet Take 1 tablet by mouth 3 (three) times daily as needed. 20 tablet 0   ibuprofen (ADVIL) 800 MG tablet Take 1 tablet (800 mg total) by mouth every 8 (eight) hours as needed. 30 tablet 2   lisinopril (ZESTRIL) 20 MG tablet Take 1 tablet by mouth once daily 90 tablet 0   methocarbamol (ROBAXIN) 500 MG tablet Take 1 tablet (500 mg total) by mouth 2 (two) times daily as needed. To be taken after surgery 30 tablet 5   Multiple Vitamin (MULTIVITAMIN WITH MINERALS) TABS tablet Take 1 tablet by mouth daily.     ondansetron (ZOFRAN) 4 MG tablet Take 1 tablet (4 mg total) by mouth every 8 (eight) hours as needed for nausea or vomiting. 40 tablet 0   oxyCODONE-acetaminophen (PERCOCET) 5-325 MG tablet Take 1-2 tablets by mouth every 8 (eight) hours as needed. To be taken after surgery 40 tablet 0   PARoxetine (PAXIL) 20 MG tablet Take 1 tablet by mouth once daily 90 tablet 0   Polyethyl Glycol-Propyl Glycol (SYSTANE ULTRA) 0.4-0.3 % SOLN Apply 1-2 drops to eye daily as needed. (Patient taking differently: Apply 1-2 drops to eye daily as needed (dry eyes).) 20 mL 0   No current facility-administered medications on file prior to visit.    Allergies:  No Known Allergies  Physical Exam Today's Vitals   01/19/21 1442  BP: 132/72  Pulse: 84  SpO2: 95%  Weight: 163 lb (73.9 kg)  Height: 4\' 11"  (1.499 m)    Body mass index is 32.92 kg/m. AP General: well developed, well nourished pleasant Spanish-speaking middle-aged Hispanic lady, seated, in no evident distress Head: head normocephalic and craniotomy scar noted in the left parietal region. Neck: supple with no carotid or supraclavicular bruits Cardiovascular: regular rate and rhythm, no murmurs Musculoskeletal: no deformity Skin:  no rash/petichiae Vascular:  Normal pulses all extremities  Neurologic Exam  Mental Status: Awake and fully alert.  Spanish-speaking only but her  daughter no issues currently with speech and language.  Oriented to place and time. Recent memory subjectively impaired and remote memory intact. Attention span, concentration and fund of knowledge appropriate during visit. Mood and affect appropriate.  Cranial Nerves: Pupils equal, briskly reactive to light. Extraocular movements full without nystagmus. Visual fields right homonymous hemianopia.  Mild left eye proptosis (chronic).  Hearing intact. Facial sensation intact.  Mild right lower facial asymmetry when she smiles., tongue, palate moves normally and symmetrically.  Motor: Normal bulk and tone. Normal strength in all tested extremity muscles. Sensory.: intact to touch ,pinprick .position and vibratory sensation.  Coordination: Rapid alternating movements normal  in all extremities. Finger-to-nose and heel-to-shin performed accurately bilaterally. Gait and Station: Arises from chair without difficulty. Stance is normal. Gait demonstrates normal stride length and balance without use of assistive device.  Reflexes: 1+ and symmetric. Toes downgoing.       ASSESSMENT/PLAN: 71 year old lady with left parietal large intracerebral hemorrhage likely of hypertensive etiology with left-sided acute subdural hematoma with cytotoxic edema and midline shift requiring craniotomy in April 2021.  Short lasting episode of aphasia possible sz vs TIA in 07/2020    APHASIA EPISODE 07/2020 -No reoccurring events -MRI unremarkable -needs to complete EEG - order will be placed again -Possible TIA -recommend continuation of aspirin 81 mg daily and atorvastatin 40 mg daily for secondary stroke prevention -LDL 153 (07/2020) -atorvastatin 40 mg daily restarted.  Repeat lipid panel today -A1c 5.7 (07/2020) -Carotid duplex unremarkable   L PARIETAL ICH -Residual cognitive impairment and right peripheral visual impairment - stable. Discussed importance of increasing daily activity as well as brain exercises.  -Repeat  CT head 10/16/2019 showed resolution of prior ICH with residual encephalomalacia -Maintain strict control of HTN with BP goal <130/90 and HLD with LDL goal<70  DEPRESSION -Previously on Prozac per PCP but apparently stopped taking -Advised to follow-up with PCP to further discuss    Follow-up in 6 months or call earlier if needed  CC:  Orvis Brill, MD    I spent 38 minutes of face-to-face and non-face-to-face time with patient and daughter assisting with interpretation.  This included previsit chart review, lab review, study review, order entry, electronic health record documentation, patient and daughter education and discussion regarding recent aphasia episode and possible etiologies, history of prior stroke, residual deficits, importance of managing stroke risk factors, secondary stroke prevention measures and answered all other questions to patient and daughter satisfaction  Frann Rider, AGNP-BC  [facility] [address] [place], [place] [address]  Phone [phone] Fax [phone] Note: This document was prepared with digital dictation and possible smart phrase technology. Any transcriptional errors that result from this process are unintentional.

## 2021-01-20 ENCOUNTER — Encounter: Payer: Medicare Other | Admitting: Physical Therapy

## 2021-01-20 LAB — LIPID PANEL
Chol/HDL Ratio: 2.1 ratio (ref 0.0–4.4)
Cholesterol, Total: 130 mg/dL (ref 100–199)
HDL: 62 mg/dL (ref >39)
LDL Chol Calc (NIH): 48 mg/dL (ref 0–99)
Triglycerides: 109 mg/dL (ref 0–149)
VLDL Cholesterol Cal: 20 mg/dL (ref 5–40)

## 2021-01-20 LAB — VITAMIN B12: Vitamin B-12: 809 pg/mL (ref 232–1245)

## 2021-01-22 ENCOUNTER — Ambulatory Visit (INDEPENDENT_AMBULATORY_CARE_PROVIDER_SITE_OTHER): Payer: Medicare Other | Admitting: Physical Therapy

## 2021-01-22 ENCOUNTER — Other Ambulatory Visit: Payer: Self-pay

## 2021-01-22 DIAGNOSIS — M6281 Muscle weakness (generalized): Secondary | ICD-10-CM

## 2021-01-22 DIAGNOSIS — M25561 Pain in right knee: Secondary | ICD-10-CM

## 2021-01-22 DIAGNOSIS — R600 Localized edema: Secondary | ICD-10-CM

## 2021-01-22 DIAGNOSIS — R262 Difficulty in walking, not elsewhere classified: Secondary | ICD-10-CM | POA: Diagnosis not present

## 2021-01-22 DIAGNOSIS — M25661 Stiffness of right knee, not elsewhere classified: Secondary | ICD-10-CM | POA: Diagnosis not present

## 2021-01-22 DIAGNOSIS — G8929 Other chronic pain: Secondary | ICD-10-CM

## 2021-01-22 NOTE — Therapy (Signed)
[facility] [address] [place], [place], [address] Phone: [phone]   Fax:  [phone]  Physical Therapy Treatment  Patient Details  Name: GWENDOLYNE WELFORD MRN: [id number] Date of Birth: 1950/11/10 Referring Provider (PT): Dollene Primrose   Encounter Date: 01/22/2021   PT End of Session - 01/22/21 1317     Visit Number 21    Number of Visits 23    Date for PT Re-Evaluation 01/23/21    Authorization Type MCR c KX modifier    Progress Note Due on Visit 24    PT Start Time 0103    PT Stop Time 0141    PT Time Calculation (min) 38 min    Activity Tolerance Patient tolerated treatment well    Behavior During Therapy [employer] for tasks assessed/performed             Past Medical History:  Diagnosis Date   Anxiety    on meds   Arthritis    bilateral knees   Cataract    RIGHT eye   Depression    on meds   Herpes zoster kerstitis s/p corneal transplant 2015    Hypertension    on meds   Right knee osteoarthritis    Strabismus    due to eye injury as a child   Stroke ([place]) 07/2019   stroke with craniotomy    Past Surgical History:  Procedure Laterality Date   CESAREAN SECTION      x 2    CHOLECYSTECTOMY     COLONOSCOPY  04/22/2020   CORNEAL TRANSPLANT Left    2006 and 2016   CRANIOTOMY Left 08/09/2019   Procedure: CRANIOTOMY HEMATOMA EVACUATION SUBDURAL;  Surgeon: Ashok Pall, MD;  Location: [place];  Service: Neurosurgery;  Laterality: Left;  left   EYE SURGERY     TOTAL KNEE ARTHROPLASTY Right 10/10/2020   Procedure: RIGHT TOTAL KNEE ARTHROPLASTY;  Surgeon: Leandrew Koyanagi, MD;  Location: [facility];  Service: Orthopedics;  Laterality: Right;   TUBAL LIGATION      There were no vitals filed for this visit.   Subjective Assessment - 01/22/21 1316     Subjective no significant pain to report today, does not report any difficulty with any specific activities, but gets confused by this question.    Patient is accompained by:  Interpreter   [name]   Pertinent History Rt TKA,CVA    Limitations Standing;Walking;House hold activities    How long can you sit comfortably? Stiffness after prolonged sitting    How long can you stand comfortably? 30 minutes    How long can you walk comfortably? A block without her cane    Patient Stated Goals Reduce pain and improve flexion AROM    Pain Onset More than a month ago               [facility] Adult PT Treatment/Exercise - 01/22/21 0001       Knee/Hip Exercises: Aerobic   Recumbent Bike 10 minutes full revolutons      Knee/Hip Exercises: Machines for Strengthening   Cybex Knee Extension DL 10# 2X15    Cybex Knee Flexion DL 35# 2X15    Total Gym Leg Press 125# 20reps 2 sets      Knee/Hip Exercises: Standing   Forward Step Up Right;10 reps;Step Height: 8";Hand Hold: 1    Other Standing Knee Exercises lateral walking and retro walking by counter top, 3 round trips without UE support      Manual Therapy  Manual therapy comments Rt knee PROM with overpressure and flexion mobs                       PT Short Term Goals - 12/10/20 1630       PT SHORT TERM GOAL #1   Title Pt will be Independent and compliant with initial HEP    Status Achieved               PT Long Term Goals - 01/22/21 1319       PT LONG TERM GOAL #1   Title Patient will be independent with final progressive HEP for strengthening/balance (ALL LTGS DUE: 01/23/21)    Baseline unsure of compliance due to cognitive deficits but does work on this with family member assistance    Time 4    Period Weeks    Status Partially Met      PT LONG TERM GOAL #2   Title Patient reports ambulating & ADLs in home without device and community with single point cane.    Baseline now met 10/13    Time 4    Period Weeks    Status Achieved      PT LONG TERM GOAL #3   Title She will improve Rt knee PROM 0-115 degreees to improve funciton.    Baseline 0-115 PROM on 01/22/21    Time 4     Period Weeks    Status Achieved      PT LONG TERM GOAL #4   Title She will improve Rt hip/knee strength to 5/5 MMT tested in sitting.    Baseline now 5/5 on 10/13    Time 4    Period Weeks    Status Achieved      PT LONG TERM GOAL #5   Title She will have overall less than 3/10 pain with general activity.    Baseline mostly met, difficult to assess due to confusion and cognitive deficit, she does not appear to be in any pain with exercises and activities.    Time 4    Period Weeks    Status Partially Met                   Plan - 01/22/21 1318     Clinical Impression Statement I feel she is at a good overall functional level in terms of her Rt knee S/P Rt TKA and has met her strength, ambulation, and ROM goals. 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 It is difficult to assess her pain and HEP compliance due to her cognitive deficits and confusion with these questions. I feel she is ready to discharge due to her progress and I explained that we will finish up wit PT next visit.    Personal Factors and Comorbidities Comorbidity 1    Comorbidities CVA with decreased peripheral vision and memory    Examination-Activity Limitations Carry;Lift;Stand;Stairs;Squat;Sleep;Locomotion Level;Transfers;Bend    Examination-Participation Restrictions Cleaning;Community Activity;Driving;Laundry;Shop    Stability/Clinical Decision Making Evolving/Moderate complexity    Rehab Potential Good    PT Frequency 4x / week   4x/wk x 1 wk, then 1-2 x/wk x 3 wks   PT Duration 4 weeks    PT Treatment/Interventions ADLs/Self Care Home Management;Cryotherapy;Electrical Stimulation;Iontophoresis 59m/ml Dexamethasone;Moist Heat;Ultrasound;Gait training;Therapeutic activities;Therapeutic exercise;Neuromuscular re-education;Balance training;Manual techniques;Passive range of motion;Dry needling;Joint Manipulations;Vasopneumatic Device;Taping;DME Instruction;Stair training;Functional mobility  training;Patient/family education;Scar mobilization    PT Next Visit Plan DC this visit, print gym activity HEP (leg press, leg  ext, leg curl machines) she can do as she plans to continue exercises at gym with family member asssistance.    PT Home Exercise Plan Access Code: [email or web address]    Consulted and Agree with Plan of Care Patient             Patient will benefit from skilled therapeutic intervention in order to improve the following deficits and impairments:  Decreased activity tolerance, Decreased balance, Decreased coordination, Decreased endurance, Decreased mobility, Decreased range of motion, Decreased strength, Difficulty walking, Impaired flexibility, Increased edema, Pain  Visit Diagnosis: Difficulty in walking, not elsewhere classified  Muscle weakness (generalized)  Localized edema  Chronic pain of right knee  Stiffness of right knee, not elsewhere classified     Problem List Patient Active Problem List   Diagnosis Date Noted   Status post total right knee replacement 10/10/2020   Vitamin D deficiency 04/24/2020   Chronic RUQ pain 01/14/2020   Hyperlipemia 08/23/2019   ICH (intracerebral hemorrhage) ([place]) L parietal w/ L SDH s/p crani 08/09/2019   Healthcare maintenance 06/20/2019   History of strabismus surgery 03/31/2019   Moderate, recurrent MDD 06/06/2018   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 [place], [place], [address] Phone: [phone]   Fax:  [phone]  Name: MIRRANDA MONRROY MRN: [id number] Date of Birth: 05-Aug-1950

## 2021-01-27 ENCOUNTER — Other Ambulatory Visit: Payer: Self-pay

## 2021-01-27 ENCOUNTER — Encounter: Payer: Self-pay | Admitting: Physical Therapy

## 2021-01-27 ENCOUNTER — Ambulatory Visit (INDEPENDENT_AMBULATORY_CARE_PROVIDER_SITE_OTHER): Payer: Medicare Other | Admitting: Physical Therapy

## 2021-01-27 DIAGNOSIS — R262 Difficulty in walking, not elsewhere classified: Secondary | ICD-10-CM

## 2021-01-27 DIAGNOSIS — M25561 Pain in right knee: Secondary | ICD-10-CM

## 2021-01-27 DIAGNOSIS — G8929 Other chronic pain: Secondary | ICD-10-CM

## 2021-01-27 DIAGNOSIS — R600 Localized edema: Secondary | ICD-10-CM

## 2021-01-27 DIAGNOSIS — M6281 Muscle weakness (generalized): Secondary | ICD-10-CM | POA: Diagnosis not present

## 2021-01-27 NOTE — Therapy (Signed)
[facility] [address] [place], [place], [address] Phone: [phone]   Fax:  [phone]  Physical Therapy Treatment & Discharge Summary  Patient Details  Name: Michelle Carrillo MRN: [id number] Date of Birth: 1950/08/08 Referring Provider (PT): Dollene Primrose   Encounter Date: 01/27/2021  PHYSICAL THERAPY DISCHARGE SUMMARY  Visits from Start of Care: 22  Current functional level related to goals / functional outcomes: See below   Remaining deficits: See below   Education / Equipment: HEP & gym program.    Patient agrees to discharge. Patient goals were partially met. Patient is being discharged due to meeting the stated rehab goals.    PT End of Session - 01/27/21 1242     Visit Number 22    Number of Visits 23    Date for PT Re-Evaluation 01/23/21    Authorization Type MCR c KX modifier    Progress Note Due on Visit 24    PT Start Time 1148    PT Stop Time 1230    PT Time Calculation (min) 42 min    Activity Tolerance Patient tolerated treatment well    Behavior During Therapy WFL for tasks assessed/performed             Past Medical History:  Diagnosis Date   Anxiety    on meds   Arthritis    bilateral knees   Cataract    RIGHT eye   Depression    on meds   Herpes zoster kerstitis s/p corneal transplant 2015    Hypertension    on meds   Right knee osteoarthritis    Strabismus    due to eye injury as a child   Stroke ([place]) 07/2019   stroke with craniotomy    Past Surgical History:  Procedure Laterality Date   CESAREAN SECTION      x 2    CHOLECYSTECTOMY     COLONOSCOPY  04/22/2020   CORNEAL TRANSPLANT Left    2006 and 2016   CRANIOTOMY Left 08/09/2019   Procedure: CRANIOTOMY HEMATOMA EVACUATION SUBDURAL;  Surgeon: Ashok Pall, MD;  Location: [facility];  Service: Neurosurgery;  Laterality: Left;  left   EYE SURGERY     TOTAL KNEE ARTHROPLASTY Right 10/10/2020   Procedure: RIGHT TOTAL KNEE ARTHROPLASTY;   Surgeon: Leandrew Koyanagi, MD;  Location: [facility];  Service: Orthopedics;  Laterality: Right;   TUBAL LIGATION      There were no vitals filed for this visit.   Subjective Assessment - 01/27/21 1148     Subjective no significant pain.  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    Patient is accompained by: Interpreter   [name]   Pertinent History Rt TKA,CVA    Limitations Standing;Walking;House hold activities    How long can you sit comfortably? Stiffness after prolonged sitting    How long can you stand comfortably? 30 minutes    How long can you walk comfortably? A block without her cane    Patient Stated Goals Reduce pain and improve flexion AROM    Currently in Pain? Yes    Pain Score 2     Pain Location Knee    Pain Orientation Right    Pain Descriptors / Indicators Tightness;Sore    Pain Type Chronic pain    Pain Onset More than a month ago    Pain Frequency Intermittent    Aggravating Factors  end range bending    Pain Relieving Factors rest                               [facility] Adult PT Treatment/Exercise - 01/27/21 1150       Self-Care   Self-Care Other Self-Care Comments    Other Self-Care Comments  PT demo & verbal cues on how to elevate.  pt & dtr verbalized understanding.      Knee/Hip Exercises: Aerobic   Recumbent Bike 10 minutes full revolutons   PT educated on seat setting. Recommended [employer] 10 min 2 sets with 5 min rest bw and decrease rest as she improves. pt & dtr verbalized understanding.     Knee/Hip Exercises: Machines for Strengthening   Cybex Knee Extension DL 10# 2X15   PT instructed thru dtr who was interpreter in set up, weight selection and reps. pt & dtr verbalized understanding.   Cybex Knee Flexion DL 25# 2X15   PT instructed thru dtr who was interpreter in set up, weight selection and reps. pt & dtr verbalized understanding.   Total Gym Leg Press 150#  15reps 2 sets   PT instructed thru dtr who was interpreter in set up, weight selection and reps. pt & dtr verbalized understanding.     Knee/Hip Exercises: Standing   Forward Step Up Right;10 reps;Step Height: 8";Hand Hold: 1    Forward Step Up Limitations PT instructed thru dtr who was interpreter in set up, weight selection and reps. pt & dtr verbalized understanding.    Other Standing Knee Exercises --                       PT Short Term Goals - 12/10/20 1630       PT SHORT TERM GOAL #1   Title Pt will be Independent and compliant with initial HEP    Status Achieved               PT Long Term Goals - 01/22/21 1319       PT LONG TERM GOAL #1   Title Patient will be independent with final progressive HEP for strengthening/balance (ALL LTGS DUE: 01/23/21)    Baseline unsure of compliance due to cognitive deficits but does work on this with family member assistance    Time 4    Period Weeks    Status Partially Met      PT LONG TERM GOAL #2   Title Patient reports ambulating & ADLs in home without device and community with single point cane.    Baseline now met 10/13    Time 4    Period Weeks    Status Achieved      PT LONG TERM GOAL #3   Title She will improve Rt knee PROM 0-115 degreees to improve funciton.    Baseline 0-115 PROM on 01/22/21    Time 4    Period Weeks    Status Achieved      PT LONG TERM GOAL #4   Title She will improve Rt hip/knee strength to 5/5 MMT tested in sitting.    Baseline now 5/5 on 10/13    Time 4    Period Weeks    Status Achieved      PT LONG TERM GOAL #5   Title She will have overall less than 3/10 pain with general activity.    Baseline mostly met, difficult to assess due  to confusion and cognitive deficit, she does not appear to be in any pain with exercises and activities.    Time 4    Period Weeks    Status Partially Met                   Plan - 01/27/21 1243     Clinical Impression Statement  Today's focused on exercising at gym with her children with recommendation 2-3 x/wk and HEP other days.  Dtr and pt appear to understand.  Patient appears to be functioning at community level safely.    Personal Factors and Comorbidities Comorbidity 1    Comorbidities CVA with decreased peripheral vision and memory    Examination-Activity Limitations Carry;Lift;Stand;Stairs;Squat;Sleep;Locomotion Level;Transfers;Bend    Examination-Participation Restrictions Cleaning;Community Activity;Driving;Laundry;Shop    Stability/Clinical Decision Making Evolving/Moderate complexity    Rehab Potential Good    PT Frequency 4x / week   4x/wk x 1 wk, then 1-2 x/wk x 3 wks   PT Duration 4 weeks    PT Treatment/Interventions ADLs/Self Care Home Management;Cryotherapy;Electrical Stimulation;Iontophoresis 42m/ml Dexamethasone;Moist Heat;Ultrasound;Gait training;Therapeutic activities;Therapeutic exercise;Neuromuscular re-education;Balance training;Manual techniques;Passive range of motion;Dry needling;Joint Manipulations;Vasopneumatic Device;Taping;DME Instruction;Stair training;Functional mobility training;Patient/family education;Scar mobilization    PT Next Visit Plan discharge PT    PT Home Exercise Plan Access Code: [email or web address]    Consulted and Agree with Plan of Care Patient             Patient will benefit from skilled therapeutic intervention in order to improve the following deficits and impairments:  Decreased activity tolerance, Decreased balance, Decreased coordination, Decreased endurance, Decreased mobility, Decreased range of motion, Decreased strength, Difficulty walking, Impaired flexibility, Increased edema, Pain  Visit Diagnosis: Difficulty in walking, not elsewhere classified  Muscle weakness (generalized)  Localized edema  Chronic pain of right knee     Problem List Patient Active Problem List   Diagnosis Date Noted   Status post total right knee replacement 10/10/2020   Vitamin  D deficiency 04/24/2020   Chronic RUQ pain 01/14/2020   Hyperlipemia 08/23/2019   ICH (intracerebral hemorrhage) ([place] L parietal w/ L SDH s/p crani 08/09/2019   Healthcare maintenance 06/20/2019   History of strabismus surgery 03/31/2019   Moderate, recurrent MDD 06/06/2018   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

## 2021-01-29 ENCOUNTER — Ambulatory Visit (INDEPENDENT_AMBULATORY_CARE_PROVIDER_SITE_OTHER): Payer: Medicare Other | Admitting: Neurology

## 2021-01-29 DIAGNOSIS — R4701 Aphasia: Secondary | ICD-10-CM

## 2021-01-29 DIAGNOSIS — I611 Nontraumatic intracerebral hemorrhage in hemisphere, cortical: Secondary | ICD-10-CM

## 2021-02-19 ENCOUNTER — Encounter: Payer: Medicare Other | Admitting: Internal Medicine

## 2021-02-23 ENCOUNTER — Ambulatory Visit (INDEPENDENT_AMBULATORY_CARE_PROVIDER_SITE_OTHER): Payer: Medicare Other | Admitting: Internal Medicine

## 2021-02-23 ENCOUNTER — Encounter: Payer: Self-pay | Admitting: Internal Medicine

## 2021-02-23 ENCOUNTER — Other Ambulatory Visit: Payer: Self-pay

## 2021-02-23 VITALS — BP 132/71 | HR 82 | Ht 59.0 in | Wt 170.0 lb

## 2021-02-23 DIAGNOSIS — I10 Essential (primary) hypertension: Secondary | ICD-10-CM

## 2021-02-23 DIAGNOSIS — H269 Unspecified cataract: Secondary | ICD-10-CM

## 2021-02-23 DIAGNOSIS — F32A Depression, unspecified: Secondary | ICD-10-CM

## 2021-02-23 DIAGNOSIS — Z0000 Encounter for general adult medical examination without abnormal findings: Secondary | ICD-10-CM

## 2021-02-23 MED ORDER — AMLODIPINE BESYLATE 10 MG PO TABS: 10.0000 mg | ORAL_TABLET | Freq: Every day | ORAL | 0 refills | Status: DC

## 2021-02-23 MED ORDER — PAROXETINE HCL 20 MG PO TABS: 30.0000 mg | ORAL_TABLET | Freq: Every day | ORAL | 2 refills | Status: DC

## 2021-02-23 MED ORDER — LISINOPRIL 20 MG PO TABS: 20.0000 mg | ORAL_TABLET | Freq: Every day | ORAL | 0 refills | Status: DC

## 2021-02-23 MED ORDER — PAROXETINE HCL 30 MG PO TABS: 30.0000 mg | ORAL_TABLET | Freq: Every day | ORAL | 2 refills | Status: DC

## 2021-02-23 NOTE — Assessment & Plan Note (Signed)
Patient is followed by neurology for history of ICH.  Has a BP goal of <830 systolic.  Today, blood pressure of 140/60.  Repeat BP of 132/71.  Patient states that she has been compliant with her blood pressure medications, with the exception of this morning (she has not taken her amlodipine or lisinopril yet today).  P: Likely elevated in the setting of missing BP meds this morning.  Continue amlodipine 10 mg daily and lisinopril 20 mg daily.

## 2021-02-23 NOTE — Progress Notes (Addendum)
    CC: routine follow-up  HPI:  [name] is a 70 y.o. with past medical history as noted below who presents to the clinic today for routine follow-up. Please see problem-based list for further details, assessments, and plans.  Past Medical History:  Diagnosis Date   Anxiety    on meds   Arthritis    bilateral knees   Cataract    RIGHT eye   Depression    on meds   Herpes zoster kerstitis s/p corneal transplant 2015    Hypertension    on meds   Right knee osteoarthritis    Strabismus    due to eye injury as a child   Stroke ([place]) 07/2019   stroke with craniotomy   Review of Systems:  Review of Systems  Constitutional: Negative.   HENT: Negative.    Eyes:  Positive for blurred vision.  Respiratory: Negative.    Cardiovascular: Negative.   Gastrointestinal:  Positive for abdominal pain.       Does not occur always, sometimes after eating spicy foods.  Genitourinary: Negative.   Musculoskeletal: Negative.   Skin: Negative.   Neurological: Negative.  Negative for dizziness.  Endo/Heme/Allergies: Negative.   Psychiatric/Behavioral:  Positive for depression.    Physical Exam:  Vitals:   02/23/21 1446 02/23/21 1553  BP: 140/60 132/71  Pulse: 82   SpO2: 98%   Weight: 170 lb (77.1 kg)   Height: 4\' 11"  (1.499 m)    Physical Exam Constitutional:      General: She is not in acute distress.    Appearance: Normal appearance.  HENT:     Head: Normocephalic and atraumatic.  Eyes:     Extraocular Movements: Extraocular movements intact.  Cardiovascular:     Rate and Rhythm: Normal rate and regular rhythm.     Heart sounds: No murmur heard.   No friction rub. No gallop.  Pulmonary:     Effort: Pulmonary effort is normal. No respiratory distress.     Breath sounds: Normal breath sounds. No wheezing, rhonchi or rales.  Abdominal:     General: There is no distension.     Palpations: Abdomen is soft.     Tenderness: There is no abdominal tenderness.   Skin:    General: Skin is warm and dry.  Neurological:     General: No focal deficit present.     Mental Status: She is alert and oriented to person, place, and time. Mental status is at baseline.  Psychiatric:        Mood and Affect: Mood normal.        Behavior: Behavior normal.     Assessment & Plan:   See Encounters Tab for problem based charting.  Patient seen with Dr. Dareen Piano

## 2021-02-23 NOTE — Assessment & Plan Note (Signed)
Per chart review, patient has been on paroxetine 20 mg daily for years.  She states that she sometimes forgets to take this medication.  However, when she does take this medication, she says that she still feels down at times.  She is wondering if she can try increasing this medication to help her symptoms.  P: Increased paroxetine from 20 mg daily to 30 mg daily today.  We discussed that it can take 4-6 weeks for this medication to work.  We will follow-up in 6 to 8 weeks to assess the efficacy of this increased dose.

## 2021-02-23 NOTE — Assessment & Plan Note (Signed)
Per chart review, patient was seen by neuro-ophthalmology.  At that time, found to have right homonymous hemianopsia, no treatments for this.  Recommended referral to regular ophthalmologist for discussion of possible future cataract surgery.  She and her daughter are requesting referral to ophthalmology.  We have placed referral here today.

## 2021-02-23 NOTE — Patient Instructions (Addendum)
Thank you, Ms.Maiyah E Espitia-Juarez for allowing [place] to provide your care today.   Discutimos tus medicinas, tu presion alta, y tus ojos.  1) Necesitas tomar paroxetine 30 mg cada dia.  2) He colocado una referencia para oftalmologa hoy.  3) Continua amlodipine 10 mg cada dia y lisinopril 20 mg cada dia. Tambien continua atorvastatin.   I have ordered the following labs for you:  Lab Orders  No laboratory test(s) ordered today     Referrals ordered today:    Referral Orders         Ambulatory referral to Ophthalmology      I have ordered the following medication/changed the following medications:   Stop the following medications: Medications Discontinued During This Encounter  Medication Reason   amLODipine (NORVASC) 10 MG tablet Reorder   lisinopril (ZESTRIL) 20 MG tablet Reorder     Start the following medications: Meds ordered this encounter  Medications   lisinopril (ZESTRIL) 20 MG tablet    Sig: Take 1 tablet (20 mg total) by mouth daily.    Dispense:  90 tablet    Refill:  0   amLODipine (NORVASC) 10 MG tablet    Sig: Take 1 tablet (10 mg total) by mouth daily.    Dispense:  90 tablet    Refill:  0   PARoxetine (PAXIL) 20 MG tablet    Sig: Take 1.5 tablets (30 mg total) by mouth daily.    Dispense:  30 tablet    Refill:  2     Follow up:  6-8 weeks  (semanas)  Should you have any questions or concerns please call the internal medicine clinic at [phone].

## 2021-03-02 NOTE — Progress Notes (Signed)
Internal Medicine Clinic Attending ? ?I saw and evaluated the patient.  I personally confirmed the key portions of the history and exam documented by Dr. Bonanno and I reviewed pertinent patient test results.  The assessment, diagnosis, and plan were formulated together and I agree with the documentation in the resident?s note. ? ?

## 2021-03-10 ENCOUNTER — Telehealth: Payer: Self-pay | Admitting: *Deleted

## 2021-03-10 NOTE — Telephone Encounter (Signed)
Ortho bundle 90 day call and survey completed. 

## 2021-03-16 ENCOUNTER — Ambulatory Visit: Payer: Medicare Other | Admitting: Podiatry

## 2021-03-19 ENCOUNTER — Ambulatory Visit: Payer: Medicare Other | Admitting: Podiatry

## 2021-03-19 DIAGNOSIS — H2511 Age-related nuclear cataract, right eye: Secondary | ICD-10-CM | POA: Diagnosis not present

## 2021-03-19 DIAGNOSIS — H35363 Drusen (degenerative) of macula, bilateral: Secondary | ICD-10-CM | POA: Diagnosis not present

## 2021-03-19 DIAGNOSIS — I6609 Occlusion and stenosis of unspecified middle cerebral artery: Secondary | ICD-10-CM | POA: Diagnosis not present

## 2021-03-19 DIAGNOSIS — Z947 Corneal transplant status: Secondary | ICD-10-CM | POA: Diagnosis not present

## 2021-05-26 ENCOUNTER — Telehealth: Payer: Self-pay

## 2021-05-26 NOTE — Telephone Encounter (Signed)
Return call to pt's daughter - "call cannot be completed at this time"; unable to leave a message.

## 2021-05-26 NOTE — Telephone Encounter (Signed)
Pt's daughter requesting to speak with a nurse about getting an appt for her mother. States her mother is not feeling well and has a fever. No open slot for today, please call her back.

## 2021-06-01 IMAGING — MR MR HEAD W/O CM
11 of 12 series · 44 of 48 positions shown · non-contrast
Comparison: CT head 08/09/2019

CLINICAL DATA: Postop craniotomy for subdural evacuation.

EXAM:
MRI HEAD WITHOUT CONTRAST
MRA HEAD WITHOUT CONTRAST
TECHNIQUE: Multiplanar, multiecho pulse sequences of the brain and surrounding
structures were obtained without intravenous contrast. Angiographic
images of the head were obtained using MRA technique without
contrast.

[Series 5: DWI · axial · 3.0mm · 0.88mm/px · z∈[-170,-35]mm · 10 of 100 slices shown (1 of 4)]
[im 1/100]
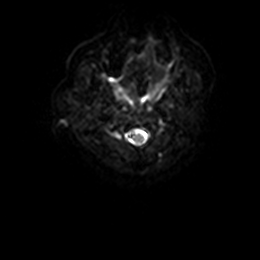
[im 12/100]
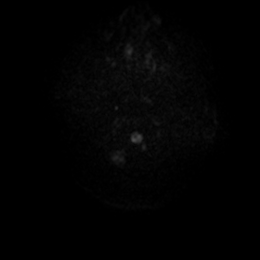
[im 23/100]
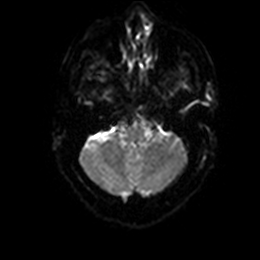
[im 34/100]
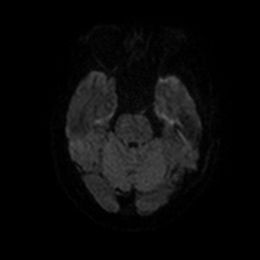
[im 45/100]
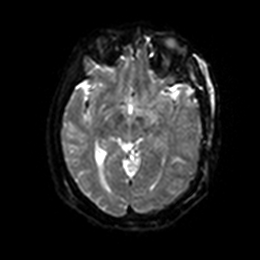
[im 56/100]
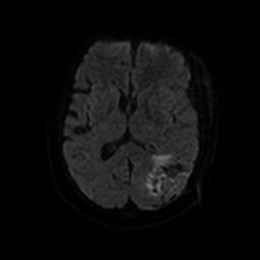
[im 67/100]
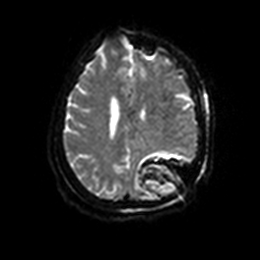
[im 78/100]
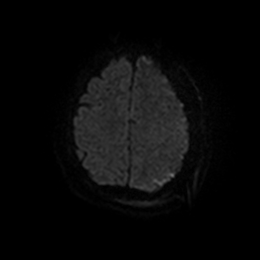
[im 89/100]
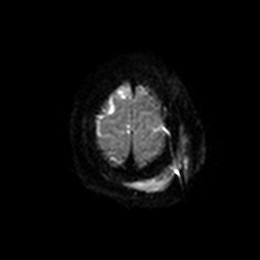
[im 100/100]
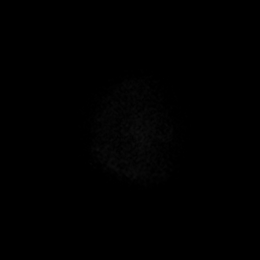

[Series 6: DWI · axial · 3.0mm · 0.88mm/px · z∈[-170,-35]mm · 5 of 50 slices shown (2 of 4)]
[im 1/50]
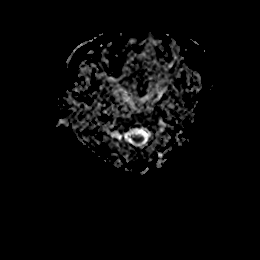
[im 13/50]
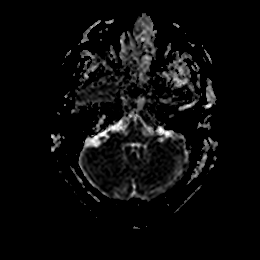
[im 25/50]
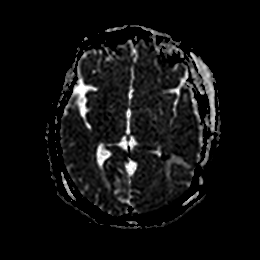
[im 37/50]
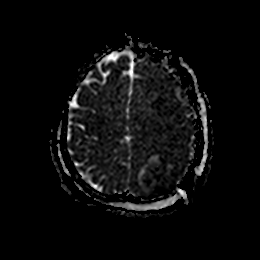
[im 50/50]
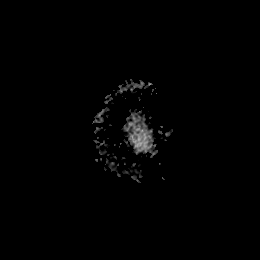

[Series 7: DWI · coronal · 4.0mm · 0.88mm/px · 6 of 72 slices shown (3 of 4)]
[im 1/72]
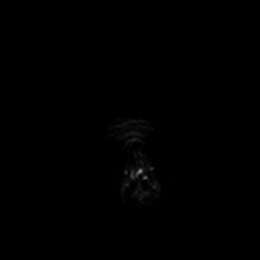
[im 15/72]
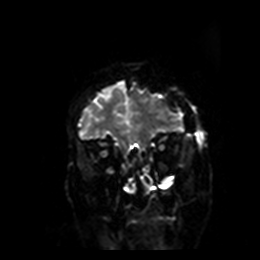
[im 29/72]
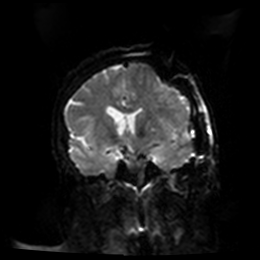
[im 43/72]
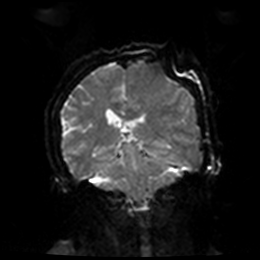
[im 57/72]
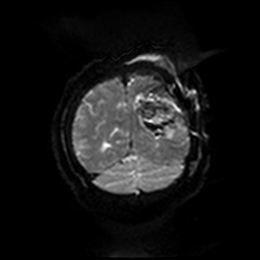
[im 72/72]
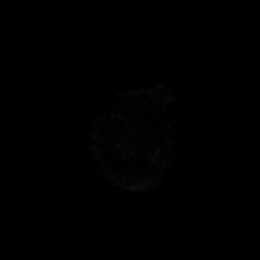

[Series 8: DWI · coronal · 4.0mm · 0.88mm/px · 3 of 36 slices shown (4 of 4)]
[im 1/36]
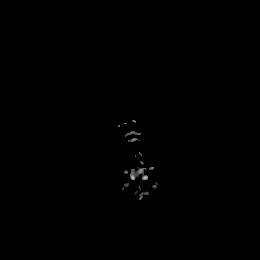
[im 18/36]
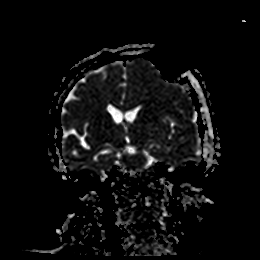
[im 36/36]
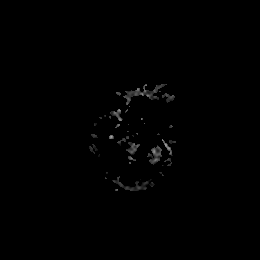

[Series 9: T1 · sagittal · 5.0mm · 0.75mm/px · 2 of 25 slices shown]
[im 1/25]
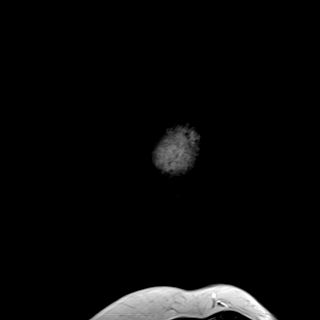
[im 25/25]
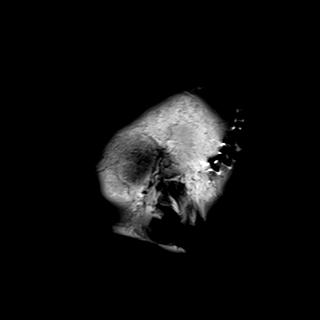

[Series 11: FLAIR · axial · 5.0mm · 0.45mm/px · z∈[-141,-13]mm · 2 of 23 slices shown]
[im 1/23]
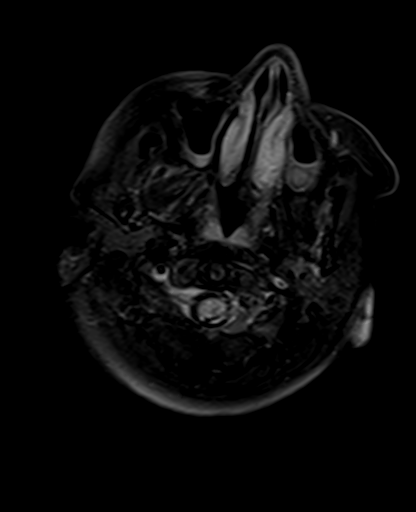
[im 23/23]
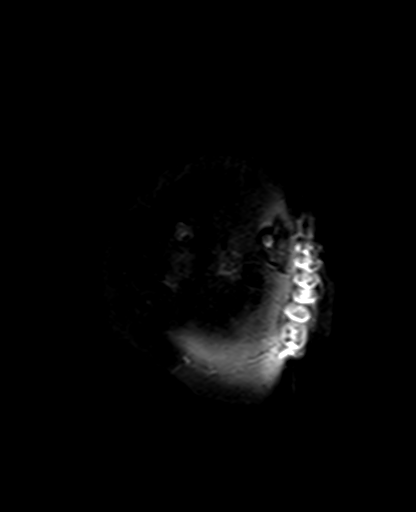

[Series 12: T2 · axial · 5.0mm · 0.72mm/px · z∈[-145,-16]mm · 2 of 23 slices shown (1 of 2)]
[im 1/23]
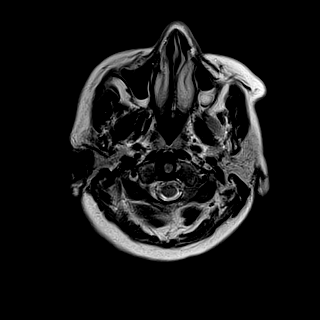
[im 23/23]
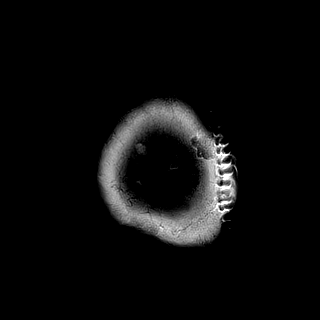

[Series 13: mag_images · axial · 3.0mm · 0.90mm/px · z∈[-152,-16]mm · 4 of 48 slices shown]
[im 1/48]
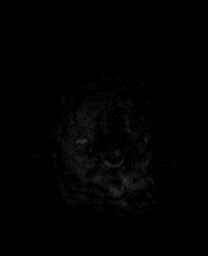
[im 16/48]
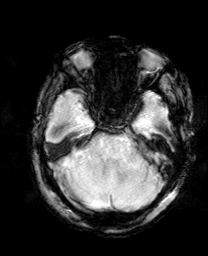
[im 32/48]
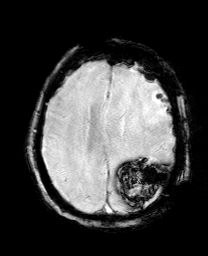
[im 48/48]
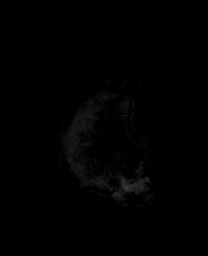

[Series 14: pha_images · axial · 3.0mm · 0.90mm/px · z∈[-152,-16]mm · 4 of 48 slices shown]
[im 1/48]
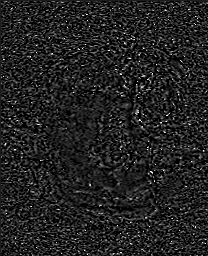
[im 16/48]
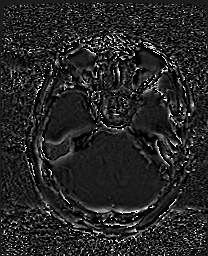
[im 32/48]
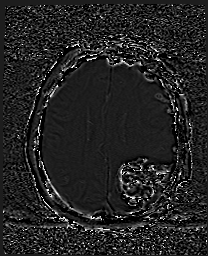
[im 48/48]
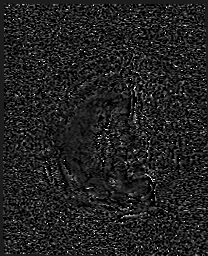

[Series 15: swi_images · axial · 3.0mm · 0.90mm/px · z∈[-152,-16]mm · 4 of 48 slices shown]
[im 1/48]
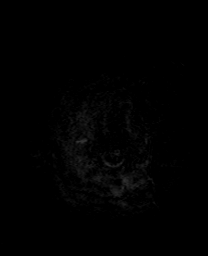
[im 16/48]
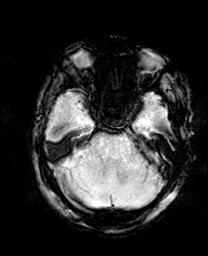
[im 32/48]
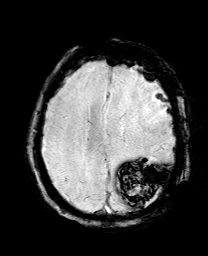
[im 48/48]
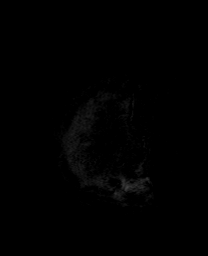

[Series 18: T2 · coronal · 5.0mm · 0.72mm/px · 2 of 26 slices shown (2 of 2)]
[im 1/26]
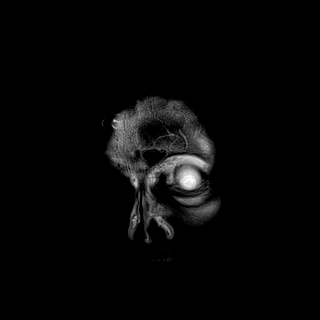
[im 26/26]
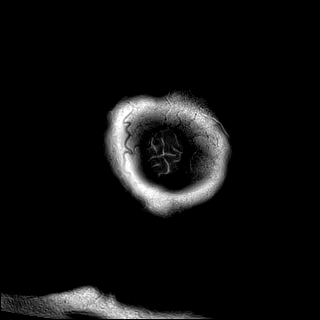

[44 of 48 positions shown; findings below may reference images not displayed]

FINDINGS: MRI HEAD FINDINGS

Brain: Interval left hemisphere craniotomy. 4 cm left parietal
parenchymal hematoma is unchanged from the prior CT. Interval
evacuation of most of the left-sided subdural hematoma. Residual
blood in fluid in the left subdural space measuring approximately 4
mm in thickness. No other areas of intracranial hemorrhage.

Mass-effect on the left cerebral hemisphere with inferior
displacement of the left lateral ventricle. Interval improvement in
midline shift now measuring approximately 4 mm. No acute or chronic
infarct. Small amount of subarachnoid hemorrhage in the left frontal
lobe.

Vascular: Normal arterial flow voids

Skull and upper cervical spine: Left hemispheric craniotomy. No
focal skeletal lesion

Sinuses/Orbits: Mucosal edema paranasal sinuses. Negative orbit.

Other: None

MRA HEAD FINDINGS

Motion degraded study.

Internal carotid artery widely patent. Mild irregularity in the
cavernous carotid bilaterally due to atherosclerotic disease without
significant stenosis. Anterior and middle cerebral arteries patent
bilaterally. Mild atherosclerotic disease in the anterior middle
cerebral arteries without flow limiting stenosis or large vessel
occlusion.

Both vertebral arteries patent to the basilar. PICA patent
bilaterally. Basilar widely patent. Superior cerebellar and
posterior cerebral arteries patent bilaterally.

Negative for aneurysm or vascular malformation.
IMPRESSION: 1. 4 cm left parietal parenchymal hematoma unchanged
2. Interval craniotomy and drainage of left subdural hematoma with
mild amount of residual fluid in sub and blood in the left subdural
space. Improvement in mass-effect and midline shift which now
measures 4 mm to the right. No other areas of hemorrhage.
3. MRA head demonstrates mild intracranial atherosclerotic disease.
No vascular malformation. Motion degraded MRA.

## 2021-06-02 DIAGNOSIS — H52212 Irregular astigmatism, left eye: Secondary | ICD-10-CM | POA: Diagnosis not present

## 2021-06-02 DIAGNOSIS — Z947 Corneal transplant status: Secondary | ICD-10-CM | POA: Diagnosis not present

## 2021-06-02 DIAGNOSIS — H2511 Age-related nuclear cataract, right eye: Secondary | ICD-10-CM | POA: Diagnosis not present

## 2021-06-22 ENCOUNTER — Encounter: Payer: Medicare Other | Admitting: Internal Medicine

## 2021-06-24 ENCOUNTER — Ambulatory Visit (INDEPENDENT_AMBULATORY_CARE_PROVIDER_SITE_OTHER): Payer: Medicare Other | Admitting: Internal Medicine

## 2021-06-24 ENCOUNTER — Other Ambulatory Visit (HOSPITAL_COMMUNITY)
Admission: RE | Admit: 2021-06-24 | Discharge: 2021-06-24 | Disposition: A | Discharge status: Home / Self Care | Payer: Medicare Other | Source: Ambulatory Visit | Attending: Internal Medicine | Admitting: Internal Medicine

## 2021-06-24 VITALS — BP 125/63 | HR 76 | Temp 97.9°F | Ht 59.0 in | Wt 171.6 lb

## 2021-06-24 DIAGNOSIS — F419 Anxiety disorder, unspecified: Secondary | ICD-10-CM | POA: Diagnosis not present

## 2021-06-24 DIAGNOSIS — Z0000 Encounter for general adult medical examination without abnormal findings: Secondary | ICD-10-CM

## 2021-06-24 DIAGNOSIS — Z124 Encounter for screening for malignant neoplasm of cervix: Secondary | ICD-10-CM | POA: Insufficient documentation

## 2021-06-24 DIAGNOSIS — Z780 Asymptomatic menopausal state: Secondary | ICD-10-CM | POA: Insufficient documentation

## 2021-06-24 DIAGNOSIS — N950 Postmenopausal bleeding: Secondary | ICD-10-CM

## 2021-06-24 DIAGNOSIS — Z1159 Encounter for screening for other viral diseases: Secondary | ICD-10-CM

## 2021-06-24 MED ORDER — PAROXETINE HCL 40 MG PO TABS: 40.0000 mg | ORAL_TABLET | Freq: Every day | ORAL | 2 refills | Status: DC

## 2021-06-24 MED ORDER — BUSPIRONE HCL 5 MG PO TABS: 5.0000 mg | ORAL_TABLET | Freq: Two times a day (BID) | ORAL | 2 refills | Status: DC

## 2021-06-24 NOTE — Assessment & Plan Note (Addendum)
The patient has a family history of either uterine or ovarian cancer in her mother.  The patient had a Pap smear in 2021 which was normal, the patient was told that she would no longer need Pap smears unless she were to have vaginal bleeding.  She is here today requesting a Pap smear due to her family history and due to seeing a small amount of blood on toilet paper about 2 weeks ago.  This is never happened to her before, and it has not happened again since.  Denies dysuria or hematochezia. ? ?Exam: Performed with chaperone present.  External exam is unremarkable.  Bimanual exam was unremarkable.  Pap smear performed.  Cervix is normal-appearing. ? ?Plan: ?Cytology sent out from Pap smear, will contact patient with results.  Also placed order for transvaginal ultrasound given family history of possible uterine cancer.  ? ?Addendum: Contacted patient about lab results so far as we await HPV testing which was added on by the lab this morning. Pt aware. Will contact the family again with updated results. ? ?Addendum: Contacted lab for HPV results, inform me that these were negative.  Contacted patient with results.  Our plan at this point is to obtain pelvic ultrasound as well as referral to gynecology for next steps.  Patient and family are amenable to this plan. ?

## 2021-06-24 NOTE — Assessment & Plan Note (Signed)
The patient has been on paxil 30 mg daily for her anxiety.  The patient states that she has been compliant with this medication, however this dose does not seem to be helping alleviate her symptoms.  In the past, she has tried sertraline and Prozac, both of which have not helped.   ? ?Plan: ?Will increase Paxil to 40 mg daily and add BuSpar 5 mg twice daily.  Will reassess in 4-6 weeks. ?

## 2021-06-24 NOTE — Assessment & Plan Note (Signed)
The patient is requesting tetanus shot, hepatitis C test, and shingles vaccines. ? ? ?Plan: ?We will obtain hepatitis C testing today. ?Patient instructed to go to the pharmacy to obtain tetanus and shingles vaccines as these will be less expensive for her there. ? ?

## 2021-06-24 NOTE — Progress Notes (Signed)
? ?  CC: routine follow-up ? ?HPI: ? ?Michelle Carrillo is a 71 y.o. with past medical history as noted below who presents to the clinic today for a routine follow-up. Please see problem-based list for further details, assessments, and plans.  ? ?Past Medical History:  ?Diagnosis Date  ? Anxiety   ? on meds  ? Arthritis   ? bilateral knees  ? Cataract   ? RIGHT eye  ? Depression   ? on meds  ? Herpes zoster kerstitis s/p corneal transplant 2015   ? Hypertension   ? on meds  ? Right knee osteoarthritis   ? Strabismus   ? due to eye injury as a child  ? Stroke [facility]) 07/2019  ? stroke with craniotomy  ? ?Review of Systems: Negative aside from that listed in individualized problem based charting.  ? ?Physical Exam: ? ?Vitals:  ? 06/24/21 1350  ?BP: 125/63  ?Pulse: 76  ?Temp: 97.9 ?F (36.6 ?C)  ?TempSrc: Oral  ?SpO2: 97%  ?Weight: 171 lb 9.6 oz (77.8 kg)  ?Height: '4\' 11"'$  (1.499 m)  ? ?General: NAD, nl appearance ?HE: Normocephalic, atraumatic, EOMI, Conjunctivae normal ?ENT: No congestion, no rhinorrhea, no exudate or erythema  ?Cardiovascular: Normal rate, regular rhythm. No murmurs, rubs, or gallops ?Pulmonary: Effort normal, breath sounds normal. No wheezes, rales, or rhonchi ?Abdominal: soft, nontender, bowel sounds present ?Musculoskeletal: no swelling, deformity, injury or tenderness in extremities ?GU: Performed with chaperone present: Normal external and internal anatomy.  Cervix is normal-appearing. ?Skin: Warm, dry, no bruising, erythema, or rash ?Psychiatric/Behavioral: normal mood, normal behavior   ? ? ?Assessment & Plan:  ? ?See Encounters Tab for problem based charting. ? ?Patient discussed with Dr.  Saverio Danker ? ?

## 2021-06-24 NOTE — Patient Instructions (Addendum)
Thank you, Ms.Fe E Espitia-Juarez for allowing [place] to provide your care today. Today we discussed your vaginal bleeding and anxiety.   ? ?1) We have performed a pelvic exam and have taken samples from your cervix. I will contact you with these results. I have also ordered an ultrasound to examine the pelvic anatomy. ? ?2) I have ordered Hepatitis C testing. ? ?3) Please go to the pharmacy to obtain your tetanus shot and shingle vaccine. ? ?4) For your anxiety, we will increase your Paxil to 40 mg daily and add buspar 5 mg, twice daily. If you change your mind about this increase in your medication, call our clinic. ? ?I have ordered the following labs for you: ? ?Lab Orders    ?     Hepatitis C Ab reflex to Quant PCR     ? ?Tests ordered today: ? ?Ultrasound - transvaginal ? ?Referrals ordered today:  ? ?Referral Orders  ?No referral(s) requested today  ?  ? ?I have ordered the following medication/changed the following medications:  ? ? ?Start the following medications: ?Meds ordered this encounter  ?Medications  ? PARoxetine (PAXIL) 40 MG tablet  ?  Sig: Take 1 tablet (40 mg total) by mouth daily.  ?  Dispense:  30 tablet  ?  Refill:  2  ? busPIRone (BUSPAR) 5 MG tablet  ?  Sig: Take 1 tablet (5 mg total) by mouth 2 (two) times daily.  ?  Dispense:  60 tablet  ?  Refill:  2  ?  ? ?Follow up:  1 month   ? ? ?Should you have any questions or concerns please call the internal medicine clinic at [phone].   ? ? ? ?

## 2021-06-25 LAB — HCV AB W REFLEX TO QUANT PCR: HCV Ab: NONREACTIVE

## 2021-06-25 LAB — HCV INTERPRETATION

## 2021-06-26 NOTE — Progress Notes (Signed)
Internal Medicine Clinic Attending ? ?Case discussed with Dr. Lorin Glass  At the time of the visit.  We reviewed the resident?s history and exam and pertinent patient test results.  I agree with the assessment, diagnosis, and plan of care documented in the resident?s note. Given concern for postmenopausal bleeding, Pap smear completed without abnormal exam findings and no evidence of bleeding--however this was ordered without HPV. Given result without transformation zone, we will plan to repeat with HPV testing. Will also require referral to gynecology for possible endometrial biopsy.  ? ?

## 2021-06-26 NOTE — Addendum Note (Signed)
Addended byCharise Killian on: 06/26/2021 02:08 PM ? ? Modules accepted: Level of Service ? ?

## 2021-06-27 ENCOUNTER — Other Ambulatory Visit: Payer: Self-pay | Admitting: Internal Medicine

## 2021-06-27 DIAGNOSIS — I10 Essential (primary) hypertension: Secondary | ICD-10-CM

## 2021-06-30 LAB — CYTOLOGY - PAP
Adequacy: ABSENT
Comment: NEGATIVE
Diagnosis: NEGATIVE
High risk HPV: NEGATIVE

## 2021-07-01 NOTE — Addendum Note (Signed)
Addended by: Orvis Brill on: 07/01/2021 04:33 PM ? ? Modules accepted: Orders ? ?

## 2021-07-13 ENCOUNTER — Ambulatory Visit (HOSPITAL_COMMUNITY)
Admission: RE | Admit: 2021-07-13 | Discharge: 2021-07-13 | Disposition: A | Discharge status: Home / Self Care | Payer: Medicare Other | Source: Ambulatory Visit | Attending: Internal Medicine | Admitting: Internal Medicine

## 2021-07-13 DIAGNOSIS — N950 Postmenopausal bleeding: Secondary | ICD-10-CM | POA: Insufficient documentation

## 2021-07-13 DIAGNOSIS — D259 Leiomyoma of uterus, unspecified: Secondary | ICD-10-CM | POA: Diagnosis not present

## 2021-07-17 ENCOUNTER — Other Ambulatory Visit: Payer: Self-pay | Admitting: Internal Medicine

## 2021-07-17 DIAGNOSIS — I10 Essential (primary) hypertension: Secondary | ICD-10-CM

## 2021-07-20 ENCOUNTER — Ambulatory Visit: Payer: Medicare Other | Admitting: Obstetrics and Gynecology

## 2021-07-20 ENCOUNTER — Encounter: Payer: Self-pay | Admitting: Adult Health

## 2021-07-20 ENCOUNTER — Ambulatory Visit: Payer: Medicare Other | Admitting: Adult Health

## 2021-07-20 NOTE — Progress Notes (Deleted)
?[facility] ?[address] ?[place]. [place] [address] ?[phone] ? ?     OFFICE FOLLOW-UP NOTE ? ?[name] ?Date of Birth:  July 29, 1950 ?Medical Record Number:  [id number]  ? ? ?Primary neurologist: Dr. Leonie Man ?Reason for visit: Stroke follow-up ? ? ?No chief complaint on file. ? ?  ? ? ?HPI:  ? ?Update 07/30/2021 JM: Patient returns for 41-monthstroke follow-up accompanied by her daughter who assists with interpretation per patient's permission.  Overall stable without new stroke/TIA symptoms.  Residual visual impairment and cognition stable.  No additional aphasia episodes.  Completed EEG which was unremarkable.  Compliant on aspirin and atorvastatin, denies side effects.  Blood pressure today ***.  ? ? ? ? ? ?History provided for reference purposes only ?Update 01/19/2021 JM: Returns for 695-monthtroke follow-up accompanied by her daughter, LuLeda Quailwho assists with interpretation.  Overall stable from stroke standpoint.  Residual right peripheral visual impairment stable without worsening.  Evaluated by neuro-ophthalmology per daughter request last month -confirmed right homonymous hemianopia and no treatment options available. She has not yet been seen by her primary ophthalmologist. Continues to struggle with depression and lack of motivation. No additional aphasia episodes - EEG not yet completed.  Carotid duplex unremarkable.  Denies new stroke/TIA symptoms. Remains on aspirin and atorvastatin without side effects.  Blood pressure today 132/72. S/p R TKR 10/10/2020 by Dr. XuErlinda Hongurrently working with PT. She complains of multiple other areas of pain (elbow and shoulders) .  No further concerns at this time. ? ?Update 07/17/2020 JM: Pt returns for scheduled 6-35-monthroke follow-up accompanied by her daughter, Michelle Kindleho is a [employer] interpreter  ? ?She reports continued visual impairment, "being clumsy" and cognitive impairment with some improvement since prior visit ?Recently  working with OT/SLP but daughter reports this has been completed ?Since stopping, patient does not do any of the exercises as daughter reports she is very unmotivated ?Episode on 07/13/2020 (evaluated in ED) consisted of word finding difficulty that lasted "just a few seconds" per patient but per ED notes, lasted approx 3 minutes.  No other associated symptoms such as weakness, numbness/tingling, confusion, altered mental status or loss of consciousness.  Neuro consult who felt possibly more related to seizure activity especially with history of ICH. MRI negative for acute infarct.  Recommended EEG outpatient.  No additional events since that time. ? ?Previously on atorvastatin but per daughter, PCP recommended discontinuing and recommended diet modification  ?Blood pressure 121/68 -routinely monitored at home which has been stable ? ?No further concerns at this time ? ?Update 01/14/2020 JM: [name] for 3-m81-monthoke follow-up accompanied by her daughter who assists with interpretation. Does report continued cognitive impairment such as difficulty concentrating and short-term memory loss and occasional word finding difficulty. Completed HH SLP - daughter questioning if additional outpatient therapy would be beneficial. She continues to live with her husband and 2 daughters. Maintains ADLs independently but does need assistance with IADLs such as bill paying and cooking.  Daughter reports visual concerns post stroke such as bumping into different objects currently being followed by ophthalmology with extensive ophthalmologic history.  Denies diplopia or blurred vision but possibly right peripheral impairment.  Denies new or worsening stroke/TIA symptoms.  Repeat CT head showed resolution of prior ICH.  EEG showed no evidence of seizure activity.  Keppra discontinued after slow titration and denies seizure activity or symptoms.  Blood pressure today 132/68. Monitors at home which has been stable.   Continues to  have issues with depression/anxiety currently being managed by PCP.  No further concerns at this time. ? ?Initial visit 10/09/2019 Dr. Phillips Climes. Michelle Carrillo is a pleasant 71 year old Hispanic lady who is accompanied today by her daughter as well as Spanish language interpreter Davy Pique who translates for her.  History is obtained from them, review of electronic medical records and I personally reviewed imaging films in PACS.  She has past medical history of hypertension, anxiety and depression who presented to [facility] emergency room on 08/09/2019 with altered mental status.  CT scan of the head showed a large moderate size cortically based hematoma in the left parietal region with extension into the subdural space resulting in a fair sized subdural hematoma with left-to-right midline shift.  She had neurological worsening on exam requiring emergent neurosurgical consultation and patient underwent craniotomy for evacuation of the subdural hematoma.  She was kept in the intensive care unit and blood pressure was tightly controlled.  She was started on hypertonic saline.  Follow-up CT scan showed gradual improvement of her rightward midline shift from 7 mm down.  2D echo showed normal ejection fraction without cardiac source of embolism.  EEG shows no seizure activity but she was started on Keppra for seizure prophylaxis.  Repeat CT on the day of discharge on 08/23/2019 showed reduction in midline shift from 11 to 8 mm.  Patient was found to have mild aphasia on exam but otherwise no focal deficits.  She was transferred to inpatient rehab to [facility] in [place] as there was no beds available at [facility] inpatient rehab.  Patient was there for 3 weeks and is presently living at home with her daughter.  Her speech is mostly improved though she has some word hesitancy and forgets occasional words.  She is mostly fluent when she speaks.  Her understanding is also improving though it is not back to  normal.  The patient's blood pressure has been well controlled and today it is 140/76.  She complains of some intermittent dizziness as well as anxiety.  She complains of some tingling on her head on the surgical site.  This is intermittent and not very bothersome.  She is currently getting home speech therapy.  She was started on Zoloft 25 mg by primary care physician recently but daughter feels it does not help and perhaps may have caused new side effects. ? ? ? ?ROS:   ?14 system review of systems is positive for those listed in HPI and all other systems negative ? ?PMH:  ?Past Medical History:  ?Diagnosis Date  ? Anxiety   ? on meds  ? Arthritis   ? bilateral knees  ? Cataract   ? RIGHT eye  ? Depression   ? on meds  ? Herpes zoster kerstitis s/p corneal transplant 2015   ? Hypertension   ? on meds  ? Right knee osteoarthritis   ? Strabismus   ? due to eye injury as a child  ? Stroke [facility]) 07/2019  ? stroke with craniotomy  ? ? ?Social History:  ?Social History  ? ?Socioeconomic History  ? Marital status: Married  ?  Spouse name: Not on file  ? Number of children: Not on file  ? Years of education: Not on file  ? Highest education level: Not on file  ?Occupational History  ? Not on file  ?Tobacco Use  ? Smoking status: Never  ? Smokeless tobacco: Never  ?Vaping Use  ? Vaping Use: Never used  ?Substance and Sexual Activity  ?  Alcohol use: Never  ? Drug use: Never  ? Sexual activity: Not on file  ?Other Topics Concern  ? Not on file  ?Social History Narrative  ? Not on file  ? ?Social Determinants of Health  ? ?Financial Resource Strain: Not on file  ?Food Insecurity: Not on file  ?Transportation Needs: Not on file  ?Physical Activity: Not on file  ?Stress: Not on file  ?Social Connections: Not on file  ?Intimate Partner Violence: Not on file  ? ? ?Medications:   ?Current Outpatient Medications on File Prior to Visit  ?Medication Sig Dispense Refill  ? amLODipine (NORVASC) 10 MG tablet Take 1 tablet (10 mg total)  by mouth daily. 90 tablet 0  ? aspirin EC 81 MG tablet Take 1 tablet (81 mg total) by mouth 2 (two) times daily. To be taken after surgery 84 tablet 0  ? atorvastatin (LIPITOR) 40 MG tablet Take 1 tablet (40 mg

## 2021-07-23 ENCOUNTER — Ambulatory Visit (INDEPENDENT_AMBULATORY_CARE_PROVIDER_SITE_OTHER): Payer: Medicare Other | Admitting: Internal Medicine

## 2021-07-23 ENCOUNTER — Encounter: Payer: Self-pay | Admitting: Internal Medicine

## 2021-07-23 VITALS — BP 126/66 | HR 78 | Temp 98.2°F | Ht 59.0 in | Wt 172.2 lb

## 2021-07-23 DIAGNOSIS — F32A Depression, unspecified: Secondary | ICD-10-CM

## 2021-07-23 DIAGNOSIS — R1011 Right upper quadrant pain: Secondary | ICD-10-CM

## 2021-07-23 DIAGNOSIS — I611 Nontraumatic intracerebral hemorrhage in hemisphere, cortical: Secondary | ICD-10-CM | POA: Diagnosis not present

## 2021-07-23 DIAGNOSIS — R7303 Prediabetes: Secondary | ICD-10-CM | POA: Diagnosis not present

## 2021-07-23 DIAGNOSIS — B351 Tinea unguium: Secondary | ICD-10-CM

## 2021-07-23 DIAGNOSIS — F419 Anxiety disorder, unspecified: Secondary | ICD-10-CM | POA: Diagnosis not present

## 2021-07-23 MED ORDER — ASPIRIN EC 81 MG PO TBEC: 81.0000 mg | DELAYED_RELEASE_TABLET | Freq: Every day | ORAL | 2 refills | Status: DC

## 2021-07-23 MED ORDER — OMEPRAZOLE MAGNESIUM 20 MG PO TBEC: 20.0000 mg | DELAYED_RELEASE_TABLET | Freq: Every day | ORAL | 1 refills | Status: DC

## 2021-07-23 MED ORDER — CICLOPIROX 8 % EX KIT: PACK | CUTANEOUS | 2 refills | Status: DC

## 2021-07-23 MED ORDER — PAROXETINE HCL 20 MG PO TABS: 20.0000 mg | ORAL_TABLET | Freq: Every day | ORAL | 2 refills | Status: DC

## 2021-07-23 NOTE — Progress Notes (Signed)
? ?CC: anxiety, RUQ pain with eating, toenail changes ? ?HPI: ? ?[name] is a 71 y.o. female with past medical history as detailed below. Please see problem based charting for detailed assessment and plan. ? ?Past Medical History:  ?Diagnosis Date  ? Anxiety   ? on meds  ? Arthritis   ? bilateral knees  ? Cataract   ? RIGHT eye  ? Depression   ? on meds  ? Healthcare maintenance 06/20/2019  ? Herpes zoster kerstitis s/p corneal transplant 2015   ? History of strabismus surgery 03/31/2019  ? 03/31/2019 1. Left medial rectus recession 6.61m to11.549mfrom limbus 2. Left medial rectus adjustable suture 3. Left inferior rectus 5.44m644mentral tenotomy 4. Left inferior rectus adjustable suture  Last Assessment & Plan:  03/31/2019 1. Left medial rectus recession 6.5mm63m11.5mm 17mm limbus 2. Left medial rectus adjustable suture 3. Left inferior rectus 5.44mm c51mral tenotomy 4. Left inferior r  ? Hypertension   ? on meds  ? Right knee osteoarthritis   ? Status post total right knee replacement 10/10/2020  ? Strabismus   ? due to eye injury as a child  ? Stroke (HCC) [facility]021  ? stroke with craniotomy  ? ?Review of Systems:   ?Review of Systems  ?Constitutional:  Negative for chills and fever.  ?Gastrointestinal:  Positive for abdominal pain. Negative for constipation, diarrhea, nausea and vomiting.  ?Psychiatric/Behavioral:  Positive for depression and memory loss. The patient is nervous/anxious.   ? ?Physical Exam: ? ?Vitals:  ? 07/23/21 1432  ?BP: 126/66  ?Pulse: 78  ?Temp: 98.2 ?F (36.8 ?C)  ?TempSrc: Oral  ?SpO2: 96%  ?Weight: 172 lb 3.2 oz (78.1 kg)  ?Height: '4\' 11"'$  (1.499 m)  ? ?Constitutional:Pleasant, in no acute distress. ?Cardio:Regular rate and rhythm. No murmurs, rubs, gallops. ?Pulm:Clear to auscultation bilaterally. Normal work of breathing on room air. ?Abdomen:Soft, nontender, nondistended. Negative Murphy's sign. ?MSK:NeDXI:PJASNKNLxtremity edema. No abrasions or ulcers noted. ?Skin:Warm and  dry. Increased thickness of the skin on the lateral aspect of fifth toe bilaterally without breakdown in skin. Onychomycosis to bilateral great toes and third L toe. ?Neuro:Alert and oriented x3. No focal deficit noted. ?Psych:Pleasant mood and affect. ? ? ?Assessment & Plan:  ? ?Anxiety ?Patient is here for medication adjustments for her anxiety and depression. On 03/15 her regimen was changed to paroxetine 40 mg daily and buspar 5 mg BID.  ? ?In speaking with her and her daughter, it seems that the main driver of her symptoms is the stroke that she had. Since then a lot of things have changed for her and she feels like she has a lot of health concerns weighing on her. She has been medically managed for depression and anxiety for 10-20 years but doesn't feel that she is getting great symptom control at this time. She initially tried the increased dose of paroxetine 40 mg but did not like how it made her feel--she was feeling gassy and still felt anxious. She started cutting the 40 mg tablets in half to go back to the 20 mg dose and did not have adverse symptoms any more. She did however continue to have episodes where she had a fast heart rate, increased blood pressure, and shortness of breath, and these episodes would self resolve. She did not ever start the buspar. ? ?Her daughter is worried that she is taking too many medications in general and would prefer to start decreasing the number of agents that she is on.  She asks about conservative therapies and weaning her off of these medications entirely. ? ?Assessment: I do not feel that her anxiety and depression are fully controlled at this time and the patient and her daughter agree. I understand the desire to decrease the number of medications that she is taking however I would want to see some improvement in symptoms prior to completely weaning her from therapies. Based on descriptions of her episodes she sounds like she has occasional panic attacks as well.  She does not currently go to talk therapy and I think she would benefit greatly from this. ?Plan:I have decreased her dose of paroxetine to 20 mg daily and counseled the patient and her family on taking this new dose, as well as the buspar 5 mg BID, to try and get her symptoms under better control. I explained that it does sound like she is having occasional panic attacks. I explained that we would gradually try to wean her from these medications as her symptoms tolerate and refer her to Dr. Theodis Shove for talk therapy. I have also provided the information for the [facility] Stroke Support group. We will follow-up in 4 weeks to see how she is doing with these adjustments. ? ?ICH (intracerebral hemorrhage) (HCC) L parietal w/ L SDH s/p crani ?Patient continues to experience depression and anxiety seemingly worse since her stroke. She has completed physical therapy and from that standpoint seems to be doing well. She is not currently taking aspirin therapy as her daughter wishes for the amount of medications that she takes to be lower, and is only taking her statin every other day for the same reason. ?Assessment: Patient is not taking preventative therapies as prescribed due to concerns by her family for polypharmacy. I have counseled her and her daughter on the correct dosing of these medications and explained that they are recommended after stroke as secondary prevention for future adverse events including recurrent stroke. ?Plan:Patient counseled to start aspirin 81 mg daily and to take atorvastatin 40 mg daily. HbA1c obtained to monitor for additional risk factors for ASCVD. ? ?ADDENDUM: HbA1c remains in pre-diabetes range at 6.0, increased from 5.7. ? ?Onychomycosis ?Patient has complaints of thick skin on the sides of her feet in addition to toe nail changes. On physical exam she does have hardened skin on the lateral aspects of the 5th toes without skin breaks as well as onychomycosis to bilateral fifth  toes and L third toe. She is requesting referral to a foot specialist for the skin changes on the lateral fifth toes.  ?Assessment:Onychomysis present and not currently being treated. I do not feel that her nail or skin changes warrant an outside referral at this time. ?Plan: Patient counseled on supportive shoes and cushioning around her toes. Prescription sent in for ciclopirox nail lacquer, and she has been instructed to apply this once daily to the affected nails, 1/2 centimeter around the nail, and to the nail bed, as well as to the undersurface of the nail plate if possible, and to wipe the nail clean with alcohol once weekly. I have advised her and her daughter that this type infection can take some time to resolve. ? ?Postprandial RUQ pain ?Patient reports postprandial RUQ pain, mainly associated with spicy foods and fatty, fried foods. She denies frequent NSAID use, does not have radiation of her pain to her back/shoulder, does not take any OTC medication for heartburn. On chart review it appears that this is a chronic problem for the patient.  ?Assessment:Concern  for biliary colic versus chronic cholecystitis however Percell Miller sign is negative on exam. ?Plan:Will obtain RUQ ultrasound. Prescription for daily prilosec 20 mg sent in as well. Patient counseled on etiology of postprandial pain in the RUQ including that the gallbladder is in the area and is responsible for helping digest fried and fatty foods, and that if there are stones in the gallbladder, when it contracts after eating it can cause pain. She and her daughter voiced understanding. ? ? ?See Encounters Tab for problem based charting. ? ?Patient discussed with Dr. Philipp Ovens ? ?

## 2021-07-23 NOTE — Patient Instructions (Signed)
Michelle Carrillo, ? ?Fue Health and safety inspector. Hablamos de varios temas hoy: ? ?1. Presi?n arterial alta: su presi?n arterial se ve muy bien. Siga tomando amlodipina 10 mg al d?a y lisinopril 20 mg al d?a. ?2. Ansiedad y depresi?n: Entiendo que, desde el ictus, te has sentido bastante deprimida. Entiendo que la dosis de paroxetina de 40 mg no te hizo [employer], as? que la reduje a 20 mg al d?a. Tambi?n tomar? Buspar 5 mg dos veces al d?a. Reduciremos estos medicamentos con el tiempo. Enviar? una referencia al Dr. Theodis Shove, [facility] psic?logo cl?nico tambi?n. La informaci?n del grupo local de apoyo para accidentes cerebrovasculares es: ? ?2do jueves del mes ?4:00 p. m. - 5:00 p. m., hora del Este ?[email or web address] ?Michelle Carrillo ?[email or web address] ?[place] ?3. Para su dolor de est?mago derecho al comer, orden? una ecograf?a del ?rea de su h?gado y ves?cula biliar para asegurarme de que no haya piedras que causen su dolor. Tambi?n le he enviado un medicamento llamado omeprazol que debe tomar a diario y que puede aliviar sus s?ntomas. Como comentamos, evite tomar grandes cantidades de AINE como el ibuprofeno. ?4. U?as de los pies: he recetado un medicamento que se aplicar? en las u?as de los pies afectadas, as? como en la piel alrededor de 1/2 cent?metro alrededor de la u?a, el lecho ungueal y debajo de la u?a una vez al d?a. Seguiremos de cerca esto. ?5. Tambi?n orden? una prueba de HbA1c para evaluar su diabetes. Te llamar? con [employer] ? ?Recuerde: si tiene alguna pregunta o inquietud, llame a nuestra cl?nica al [phone] [employer] 9 a. m. y las 5 p. m. y despu?s del horario de atenci?n llame al [phone] y pregunte por el residente de [place] interna de [place]. Si cree que tiene una emergencia m?dica, llame al 911. ? ?My best, ?Farrel Gordon, DO ? ? ?Michelle Carrillo, ? ?It  was a pleasure to meet you today. We discussed several topics today: ? ?High blood pressure: Your blood pressure looks great. Please keep taking amlodipine 10 mg daily and lisinopril 20 mg daily. ?Anxiety and depression: I understand that, since your stroke, you have felt quite down. I understand that the paroxetine 40 mg dose did not make you feel well so I have decreased this to 20 mg daily. You will also take Buspar 5 mg twice daily. We will wean these medications down over time. I will place a referral to Dr. Theodis Shove, our clinical psychologist as well. The local stroke support group information is: ? ?2nd Thursday of the month ?4:00 PM - 5:00 PM [place] ?[email or web address] ?Michelle Carrillo ?[email or web address] ?[place] ?3. For your right stomach pain with eating I have ordered an ultrasound of the area of your liver and gallbladder to make sure there are not any stones causing your pain. I have also sent in a medication called omeprazole which you should take daily and this may help your symptoms. As we discussed, please avoid taking large amounts of NSAIDs like ibuprofen. ?4. Toe nails: I have ordered a medication that you will apply to the toe nails that are affected, as well as the skin about 1/2 centimeter around the nail, the nail bed, and under the nail once daily. We will closely monitor this. ?5. I have also ordered a HbA1c test to evaluate you for diabetes. I will call you with these results.  ? ?Remember: If you have any questions or concerns, please call our clinic at [phone] between 9am-5pm and  after hours call [phone] and ask for the internal medicine resident on call. If you feel you are having a medical emergency please call 911. ? ?Farrel Gordon, DO ?

## 2021-07-24 LAB — HEMOGLOBIN A1C
Est. average glucose Bld gHb Est-mCnc: 126 mg/dL
Hgb A1c MFr Bld: 6 % — ABNORMAL HIGH (ref 4.8–5.6)

## 2021-07-25 DIAGNOSIS — R7303 Prediabetes: Secondary | ICD-10-CM | POA: Insufficient documentation

## 2021-07-25 NOTE — Assessment & Plan Note (Signed)
Patient continues to experience depression and anxiety seemingly worse since her stroke. She has completed physical therapy and from that standpoint seems to be doing well. She is not currently taking aspirin therapy as her daughter wishes for the amount of medications that she takes to be lower, and is only taking her statin every other day for the same reason. ?Assessment: Patient is not taking preventative therapies as prescribed due to concerns by her family for polypharmacy. I have counseled her and her daughter on the correct dosing of these medications and explained that they are recommended after stroke as secondary prevention for future adverse events including recurrent stroke. ?Plan:Patient counseled to start aspirin 81 mg daily and to take atorvastatin 40 mg daily. HbA1c obtained to monitor for additional risk factors for ASCVD. ? ?ADDENDUM: HbA1c remains in pre-diabetes range at 6.0, increased from 5.7. ?

## 2021-07-25 NOTE — Assessment & Plan Note (Signed)
Patient reports postprandial RUQ pain, mainly associated with spicy foods and fatty, fried foods. She denies frequent NSAID use, does not have radiation of her pain to her back/shoulder, does not take any OTC medication for heartburn. On chart review it appears that this is a chronic problem for the patient.  ?Assessment:Concern for biliary colic versus chronic cholecystitis however Percell Miller sign is negative on exam. ?Plan:Will obtain RUQ ultrasound. Prescription for daily prilosec 20 mg sent in as well. Patient counseled on etiology of postprandial pain in the RUQ including that the gallbladder is in the area and is responsible for helping digest fried and fatty foods, and that if there are stones in the gallbladder, when it contracts after eating it can cause pain. She and her daughter voiced understanding. ?

## 2021-07-25 NOTE — Assessment & Plan Note (Addendum)
Patient has complaints of thick skin on the sides of her feet in addition to toe nail changes. On physical exam she does have hardened skin on the lateral aspects of the 5th toes without skin breaks as well as onychomycosis to bilateral fifth toes and L third toe. She is requesting referral to a foot specialist for the skin changes on the lateral fifth toes.  ?Assessment:Onychomysis present and not currently being treated. I do not feel that her nail or skin changes warrant an outside referral at this time. ?Plan: Patient counseled on supportive shoes and cushioning around her toes. Prescription sent in for ciclopirox nail lacquer, and she has been instructed to apply this once daily to the affected nails, 1/2 centimeter around the nail, and to the nail bed, as well as to the undersurface of the nail plate if possible, and to wipe the nail clean with alcohol once weekly. I have advised her and her daughter that this type infection can take some time to resolve. ?

## 2021-07-25 NOTE — Assessment & Plan Note (Signed)
Patient is here for medication adjustments for her anxiety and depression. On 03/15 her regimen was changed to paroxetine 40 mg daily and buspar 5 mg BID.  ? ?In speaking with her and her daughter, it seems that the main driver of her symptoms is the stroke that she had. Since then a lot of things have changed for her and she feels like she has a lot of health concerns weighing on her. She has been medically managed for depression and anxiety for 10-20 years but doesn't feel that she is getting great symptom control at this time. She initially tried the increased dose of paroxetine 40 mg but did not like how it made her feel--she was feeling gassy and still felt anxious. She started cutting the 40 mg tablets in half to go back to the 20 mg dose and did not have adverse symptoms any more. She did however continue to have episodes where she had a fast heart rate, increased blood pressure, and shortness of breath, and these episodes would self resolve. She did not ever start the buspar. ? ?Her daughter is worried that she is taking too many medications in general and would prefer to start decreasing the number of agents that she is on. She asks about conservative therapies and weaning her off of these medications entirely. ? ?Assessment: I do not feel that her anxiety and depression are fully controlled at this time and the patient and her daughter agree. I understand the desire to decrease the number of medications that she is taking however I would want to see some improvement in symptoms prior to completely weaning her from therapies. Based on descriptions of her episodes she sounds like she has occasional panic attacks as well. She does not currently go to talk therapy and I think she would benefit greatly from this. ?Plan:I have decreased her dose of paroxetine to 20 mg daily and counseled the patient and her family on taking this new dose, as well as the buspar 5 mg BID, to try and get her symptoms under better  control. I explained that it does sound like she is having occasional panic attacks. I explained that we would gradually try to wean her from these medications as her symptoms tolerate and refer her to Dr. Theodis Shove for talk therapy. I have also provided the information for the [facility] Stroke Support group. We will follow-up in 4 weeks to see how she is doing with these adjustments. ?

## 2021-07-28 ENCOUNTER — Encounter: Payer: Self-pay | Admitting: Internal Medicine

## 2021-07-29 ENCOUNTER — Telehealth: Payer: Self-pay

## 2021-07-29 NOTE — Telephone Encounter (Signed)
Pa for pt ( CICLOPIROX ) came through on cover my meds was submitted with office notes ... awaiting approval or denial  ? ? ? ? ?

## 2021-07-31 NOTE — Progress Notes (Signed)
Internal Medicine Clinic Attending ? ?Case discussed with Dr. Dean  At the time of the visit.  We reviewed the resident?s history and exam and pertinent patient test results.  I agree with the assessment, diagnosis, and plan of care documented in the resident?s note.  ?

## 2021-08-03 ENCOUNTER — Ambulatory Visit: Payer: Medicare Other | Admitting: Obstetrics and Gynecology

## 2021-08-03 NOTE — Telephone Encounter (Signed)
DECISION  ? ? ?DENIED : ? ? ?Why did we deny your request? ?We denied this request under Medicare Part D because: ?The National Drug Code [facility]) of the drug you are asking for cannot be covered by your Part D  ?because it is listed as an unapproved drug on the Food and Drug Administration?s NDC Structured  ?Product Labeling (SPL) Data Elements file (or NSDE file). Therefore, it cannot be paid by your  ?Medicare Part D. The NSDE file can be located on the FDA website  ?(ChemicalPaper.be).  ?This decision does not address the medical necessity or appropriateness of the drug you are  ?asking for. It only means that it cannot be paid by your Part D. ? ?

## 2021-08-17 ENCOUNTER — Ambulatory Visit (HOSPITAL_COMMUNITY): Payer: Medicare Other

## 2021-08-17 DIAGNOSIS — Z20822 Contact with and (suspected) exposure to covid-19: Secondary | ICD-10-CM | POA: Diagnosis not present

## 2021-08-24 ENCOUNTER — Institutional Professional Consult (permissible substitution): Payer: Medicare Other | Admitting: Behavioral Health

## 2021-09-04 ENCOUNTER — Other Ambulatory Visit: Payer: Self-pay | Admitting: Internal Medicine

## 2021-09-04 DIAGNOSIS — I10 Essential (primary) hypertension: Secondary | ICD-10-CM

## 2021-09-16 ENCOUNTER — Telehealth: Payer: Self-pay | Admitting: Behavioral Health

## 2021-09-16 ENCOUNTER — Ambulatory Visit: Payer: Medicare Other | Admitting: Behavioral Health

## 2021-09-16 DIAGNOSIS — F331 Major depressive disorder, recurrent, moderate: Secondary | ICD-10-CM

## 2021-09-16 DIAGNOSIS — F419 Anxiety disorder, unspecified: Secondary | ICD-10-CM

## 2021-09-16 NOTE — Telephone Encounter (Signed)
Spoke w/Pt using [employer] & with the facilitation of Pt's 71yo Son Glenetta Borg. Explained to Pt & Son she can r/s with new Provider in early July. Pt acknowledged & agreed.  Dr. Theodis Shove

## 2021-09-16 NOTE — BH Specialist Note (Signed)
Spoke w/Pt & Son today to explain new Provider will be boarding soon & Pt can schedule in early July. Pt & Son expressed understanding & agreed.  Dr. Theodis Shove

## 2021-09-24 ENCOUNTER — Other Ambulatory Visit: Payer: Self-pay | Admitting: Student

## 2021-09-24 DIAGNOSIS — I10 Essential (primary) hypertension: Secondary | ICD-10-CM

## 2021-10-03 ENCOUNTER — Encounter: Payer: Self-pay | Admitting: *Deleted

## 2021-10-20 ENCOUNTER — Other Ambulatory Visit: Payer: Self-pay | Admitting: Internal Medicine

## 2021-10-20 DIAGNOSIS — F419 Anxiety disorder, unspecified: Secondary | ICD-10-CM

## 2021-11-19 ENCOUNTER — Telehealth: Payer: Self-pay | Admitting: Internal Medicine

## 2021-11-19 DIAGNOSIS — I10 Essential (primary) hypertension: Secondary | ICD-10-CM

## 2021-11-24 NOTE — Telephone Encounter (Signed)
Patient sent message via my chart to contact our office to schedule an appointment. 

## 2021-12-18 ENCOUNTER — Telehealth: Payer: Self-pay | Admitting: *Deleted

## 2021-12-18 NOTE — Telephone Encounter (Signed)
Call from patient's daughter states patient has been tired and not herself.  Patient also missed 3 days of her Depression  medication.  Patient has since restarted her medication is doing better but still not completely there.  Daughter feels patient is better.  Patient still wants to be seen and not have a Tele Visit that was offered for this afternoon.  Daughter to call next week to see if there are any cancellations .  Daughter  was also urged to take patient to the Urgent Care if she worsens over the weekend.

## 2021-12-30 ENCOUNTER — Encounter: Payer: Medicare Other | Admitting: Student

## 2022-01-04 ENCOUNTER — Ambulatory Visit (INDEPENDENT_AMBULATORY_CARE_PROVIDER_SITE_OTHER): Payer: Medicare (Managed Care) | Admitting: Family

## 2022-01-04 ENCOUNTER — Encounter: Payer: Self-pay | Admitting: Family

## 2022-01-04 VITALS — BP 130/78 | HR 75 | Temp 98.0°F | Resp 18 | Ht 59.0 in | Wt 177.0 lb

## 2022-01-04 DIAGNOSIS — L84 Corns and callosities: Secondary | ICD-10-CM

## 2022-01-04 DIAGNOSIS — Z7689 Persons encountering health services in other specified circumstances: Secondary | ICD-10-CM | POA: Diagnosis not present

## 2022-01-04 DIAGNOSIS — F32A Depression, unspecified: Secondary | ICD-10-CM

## 2022-01-04 DIAGNOSIS — F419 Anxiety disorder, unspecified: Secondary | ICD-10-CM

## 2022-01-04 DIAGNOSIS — R7303 Prediabetes: Secondary | ICD-10-CM

## 2022-01-04 DIAGNOSIS — I10 Essential (primary) hypertension: Secondary | ICD-10-CM

## 2022-01-04 DIAGNOSIS — E782 Mixed hyperlipidemia: Secondary | ICD-10-CM

## 2022-01-04 MED ORDER — PAROXETINE HCL 20 MG PO TABS: 20.0000 mg | ORAL_TABLET | Freq: Every day | ORAL | 2 refills | Status: DC

## 2022-01-04 MED ORDER — AMLODIPINE BESYLATE 10 MG PO TABS: 10.0000 mg | ORAL_TABLET | Freq: Every day | ORAL | 1 refills | Status: DC

## 2022-01-04 MED ORDER — LISINOPRIL 20 MG PO TABS: 20.0000 mg | ORAL_TABLET | Freq: Every day | ORAL | 1 refills | Status: DC

## 2022-01-04 NOTE — Progress Notes (Signed)
Provider: [name] FNP-C   Michelle Carrillo, Michelle Bucks, NP  Patient Care Team: Michelle Carrillo, Michelle Bucks, NP as PCP - General (Family Medicine)  Extended Emergency Contact Information Primary Emergency Contact: Michelle Carrillo, [place] [address] [place] Phone: [phone] Relation: Daughter Secondary Emergency Contact: Michelle Carrillo Address: [address]          [place],  [address] [name] Phone: [phone] Mobile Phone: [phone] Relation: Spouse  Code Status:  Full Code  Goals of care: Advanced Directive information    01/04/2022    2:25 PM  Advanced Directives  Does Patient Have a Medical Advance Directive? No  Would patient like information on creating a medical advance directive? No - Patient declined     Chief Complaint  Patient presents with   Establish Care    Patient is here to establish care with hx of high cholesterol and HTN    HPI:  Pt is a 71 y.o. female seen today establish care here at [facility] for medical management of chronic diseases.Has a medical history of Hypertension,Depression with anxiety,Hyperlipidemia Prediabetes among other condition.HPI obtain with assistance  via Michelle Carrillo translator Michelle Carrillo.Daughter also speaks english. She run out of her blood pressure pills and b/p was high for the past 4 days but improved after taking medication. She was also out of antidepressant.Daughter notes patient was more emotional crying.symptoms have improved after restarting antidepressant.   Blood pressure runs in the 120's/80's - 180's/120's at home.Daughter states whenever B/p is high patient feels fullness in her left ear.usually takes Garlic which helps with her symptoms.   States had a stroke in 2000 which affected her left eye peripheral vision.sometimes feels a tingling sensation running down the back of the neck.  She complains of pain on left 5 th toe and both great toes  calluses.   Does not smoke or drinking any alcohol.   Due for Tetanus,shingles,COVID-19 and PNA vaccine but daughter states had several vaccine given at [facility] but not sure which vaccine.she will obtain records then will update on the computer. She might be traveling to [place] for another 6 months not sure when she will be back.    Past Medical History:  Diagnosis Date   Anxiety    on meds   Arthritis    bilateral knees   Cataract    RIGHT eye   Depression    on meds   Healthcare maintenance 06/20/2019   Herpes zoster kerstitis s/p corneal transplant 2015    History of strabismus surgery 03/31/2019   03/31/2019 1. Left medial rectus recession 6.41m to11.581mfrom limbus 2. Left medial rectus adjustable suture 3. Left inferior rectus 5.47m32mentral tenotomy 4. Left inferior rectus adjustable suture  Last Assessment & Plan:  03/31/2019 1. Left medial rectus recession 6.5mm59m11.5mm 48mm limbus 2. Left medial rectus adjustable suture 3. Left inferior rectus 5.47mm c25mral tenotomy 4. Left inferior r   Hypertension    on meds   Right knee osteoarthritis    Status post total right knee replacement 10/10/2020   Strabismus    due to eye injury as a child   Stroke (HCC) [id number]   stroke with craniotomy   Past Surgical History:  Procedure Laterality Date   CESAREAN SECTION      x 2    CHOLECYSTECTOMY     COLONOSCOPY  04/22/2020   CORNEAL TRANSPLANT Left  2006 and 2016   CRANIOTOMY Left 08/09/2019   Procedure: CRANIOTOMY HEMATOMA EVACUATION SUBDURAL;  Surgeon: Michelle Pall, MD;  Location: [place];  Service: Neurosurgery;  Laterality: Left;  left   EYE SURGERY     TOTAL KNEE ARTHROPLASTY Right 10/10/2020   Procedure: RIGHT TOTAL KNEE ARTHROPLASTY;  Surgeon: Michelle Koyanagi, MD;  Location: [facility];  Service: Orthopedics;  Laterality: Right;   TUBAL LIGATION      No Known Allergies  Allergies as of 01/04/2022   No Known Allergies      Medication List        Accurate as of  January 04, 2022  2:37 PM. If you have any questions, ask your nurse or doctor.          STOP taking these medications    atorvastatin 40 MG tablet Commonly known as: LIPITOR Stopped by: Michelle Hughs, NP   multivitamin with minerals Tabs tablet Stopped by: Michelle Hughs, NP   omeprazole 20 MG tablet Commonly known as: PriLOSEC OTC Stopped by: Michelle Hughs, NP   ondansetron 4 MG tablet Commonly known as: Zofran Stopped by: Michelle Hughs, NP       TAKE these medications    acetaminophen 500 MG tablet Commonly known as: TYLENOL Take 500 mg by mouth every 6 (six) hours as needed.   amLODipine 10 MG tablet Commonly known as: NORVASC Take 1 tablet by mouth once daily   aspirin EC 81 MG tablet Take 1 tablet (81 mg total) by mouth daily. To be taken after surgery   busPIRone 5 MG tablet Commonly known as: BUSPAR Take 1 tablet (5 mg total) by mouth 2 (two) times daily.   Ciclopirox 8 % Kit Applied once daily to the affected nails, 1/2 centimeter around the nail, and to the nail bed, as well as to the undersurface of the nail plate if possible. Wipe the nail clean with alcohol once weekly.   lisinopril 20 MG tablet Commonly known as: ZESTRIL Take 1 tablet by mouth once daily   PARoxetine 20 MG tablet Commonly known as: PAXIL Take 1 tablet by mouth once daily        Review of Systems  Constitutional:  Negative for appetite change, chills, fatigue, fever and unexpected weight change.  HENT:  Negative for congestion, dental problem, ear discharge, ear pain, facial swelling, hearing loss, nosebleeds, postnasal drip, rhinorrhea, sinus pressure, sinus pain, sneezing, sore throat, tinnitus and trouble swallowing.   Eyes:  Positive for visual disturbance. Negative for pain, discharge, redness and itching.       Wears eye glasses follows up with ophthalmology   Respiratory:  Negative for cough, chest tightness, shortness of breath and wheezing.    Cardiovascular:  Negative for chest pain, palpitations and leg swelling.  Gastrointestinal:  Negative for abdominal distention, abdominal pain, blood in stool, constipation, diarrhea, nausea and vomiting.  Endocrine: Negative for cold intolerance, heat intolerance, polydipsia, polyphagia and polyuria.  Genitourinary:  Negative for difficulty urinating, dysuria, flank pain, frequency and urgency.       Wears peni-liner for incontinence   Musculoskeletal:  Negative for arthralgias, back pain, gait problem, joint swelling, myalgias, neck pain and neck stiffness.  Skin:  Negative for color change, pallor, rash and wound.  Neurological:  Negative for dizziness, syncope, speech difficulty, weakness, light-headedness, numbness and headaches.  Hematological:  Does not bruise/bleed easily.  Psychiatric/Behavioral:  Negative for agitation, behavioral problems, confusion, hallucinations, self-injury, sleep disturbance and suicidal ideas. The patient is  not nervous/anxious.     Immunization History  Administered Date(s) Administered   Influenza,inj,Quad PF,6+ Mos 01/09/2020   Moderna Sars-Covid-2 Vaccination 06/14/2019, 07/17/2019   Pneumococcal Conjugate-13 04/23/2020   Pertinent  Health Maintenance Due  Topic Date Due   INFLUENZA VACCINE  11/10/2021   MAMMOGRAM  12/23/2022   COLONOSCOPY (Pts 45-9yr Insurance coverage will need to be confirmed)  04/23/2023   DEXA SCAN  Completed      10/11/2020    7:58 AM 02/23/2021    2:51 PM 06/24/2021    1:53 PM 07/23/2021    2:35 PM 01/04/2022    2:26 PM  Fall Risk  Falls in the past year?  0 0 0 0  Was there an injury with Fall?  0 0 0 0  Fall Risk Category Calculator  0 0 0 0  Fall Risk Category  Low Low Low Low  Patient Fall Risk Level Moderate fall risk Low fall risk Low fall risk Low fall risk Low fall risk  Patient at Risk for Falls Due to  No Fall Risks  No Fall Risks No Fall Risks  Fall risk Follow up  Falls evaluation completed;Falls  prevention discussed Falls evaluation completed Falls evaluation completed;Falls prevention discussed Falls evaluation completed   Functional Status Survey:    Vitals:   01/04/22 1427  BP: 130/78  Pulse: 75  Resp: 18  Temp: 98 F (36.7 C)  TempSrc: Temporal  SpO2: 96%  Weight: 177 lb (80.3 kg)  Height: 4' 11"  (1.499 m)   Body mass index is 35.75 kg/m. Physical Exam Vitals reviewed.  Constitutional:      General: She is not in acute distress.    Appearance: Normal appearance. She is obese. She is not ill-appearing or diaphoretic.  HENT:     Head: Normocephalic.     Right Ear: Tympanic membrane, ear canal and external ear normal. There is no impacted cerumen.     Left Ear: Tympanic membrane, ear canal and external ear normal. There is no impacted cerumen.     Nose: Nose normal. No congestion or rhinorrhea.     Mouth/Throat:     Mouth: Mucous membranes are moist.     Pharynx: Oropharynx is clear. No oropharyngeal exudate or posterior oropharyngeal erythema.  Eyes:     General: No scleral icterus.       Right eye: No discharge.        Left eye: No discharge.     Extraocular Movements: Extraocular movements intact.     Conjunctiva/sclera: Conjunctivae normal.     Pupils: Pupils are equal, round, and reactive to light.  Neck:     Vascular: No carotid bruit.  Cardiovascular:     Rate and Rhythm: Normal rate and regular rhythm.     Pulses: Normal pulses.     Heart sounds: Normal heart sounds. No murmur heard.    No friction rub. No gallop.  Pulmonary:     Effort: Pulmonary effort is normal. No respiratory distress.     Breath sounds: Normal breath sounds. No wheezing, rhonchi or rales.  Chest:     Chest wall: No tenderness.  Abdominal:     General: Bowel sounds are normal. There is no distension.     Palpations: Abdomen is soft. There is no mass.     Tenderness: There is no abdominal tenderness. There is no right CVA tenderness, left CVA tenderness, guarding or rebound.   Musculoskeletal:        General: No swelling or tenderness.  Normal range of motion.     Cervical back: Normal range of motion. No rigidity or tenderness.     Right lower leg: No edema.     Left lower leg: No edema.     Right foot: Normal range of motion and normal capillary refill. No swelling, tenderness or crepitus. Normal pulse.     Left foot: Normal range of motion and normal capillary refill. No swelling, tenderness or crepitus. Normal pulse.     Comments: Left 5 th toe and bilateral great toes callus   Lymphadenopathy:     Cervical: No cervical adenopathy.  Skin:    General: Skin is warm and dry.     Coloration: Skin is not pale.     Findings: No bruising, erythema, lesion or rash.  Neurological:     Mental Status: She is alert and oriented to person, place, and time.     Cranial Nerves: No cranial nerve deficit.     Sensory: No sensory deficit.     Motor: No weakness.     Coordination: Coordination normal.     Gait: Gait normal.  Psychiatric:        Mood and Affect: Mood normal.        Speech: Speech normal.        Behavior: Behavior normal.        Thought Content: Thought content normal.        Judgment: Judgment normal.     Labs reviewed: No results for input(s): "NA", "K", "CL", "CO2", "GLUCOSE", "BUN", "CREATININE", "CALCIUM", "MG", "PHOS" in the last 8760 hours. No results for input(s): "AST", "ALT", "ALKPHOS", "BILITOT", "PROT", "ALBUMIN" in the last 8760 hours. No results for input(s): "WBC", "NEUTROABS", "HGB", "HCT", "MCV", "PLT" in the last 8760 hours. Lab Results  Component Value Date   TSH 4.100 01/10/2020   Lab Results  Component Value Date   HGBA1C 6.0 (H) 07/23/2021   Lab Results  Component Value Date   CHOL 130 01/19/2021   HDL 62 01/19/2021   LDLCALC 48 01/19/2021   TRIG 109 01/19/2021   CHOLHDL 2.1 01/19/2021    Significant Diagnostic Results in last 30 days:  No results found.  Assessment/Plan 1. Anxiety and depression Symptoms  stable  - continue on Paxil  - PARoxetine (PAXIL) 20 MG tablet; Take 1 tablet (20 mg total) by mouth daily.  Dispense: 90 tablet; Refill: 2  2. Essential hypertension B/p stable today  - continue on lisinopril and amlodipine  - lisinopril (ZESTRIL) 20 MG tablet; Take 1 tablet (20 mg total) by mouth daily.  Dispense: 90 tablet; Refill: 1 - amLODipine (NORVASC) 10 MG tablet; Take 1 tablet (10 mg total) by mouth daily.  Dispense: 90 tablet; Refill: 1 - Lipid panel; Future - TSH; Future - COMPLETE METABOLIC PANEL WITH GFR; Future - CBC with Differential/Platelet; Future  3. Prediabetes Lab Results  Component Value Date   HGBA1C 6.0 (H) 07/23/2021  - dietary modification and exercise advised  - continue to follow up with Ophthalmology  - Ambulatory referral to [place] GFR; Future - CBC with Differential/Platelet; Future - Hemoglobin A1c; Future  4. Callus of foot Painful callus on left 5 th toe and bilateral Great toe  - Ambulatory referral to Podiatry  5. Mixed hyperlipidemia LDL on chart at goal patient stopped atorvastatin previously prescribed by her previous PCP.Per daughter atorvastatin made feel bad.  - Dietary modification and exercise advised  - Lipid panel; Future  6. Encounter to establish  care Available records reviewed, Immunization indicates due but states had immunization given at a [facility] health facility.will obtain records then update on EPICRecommended to schedule for fasting lab work.    Family/ staff Communication: Reviewed plan of care with patient and daughter verbalized understanding via Translator   Labs/tests ordered:  - CBC with Differential/Platelet - CMP with eGFR([facility]) - TSH - Hgb A1C - Lipid panel  Next Appointment : Return in about 4 months (around 05/06/2022) for fasting labs in one week or sooner , medical mangement of chronic issues.Michelle Hughs, NP

## 2022-01-07 ENCOUNTER — Ambulatory Visit: Payer: Medicare Other | Admitting: Family

## 2022-01-14 ENCOUNTER — Other Ambulatory Visit: Payer: Medicare (Managed Care)

## 2022-01-14 DIAGNOSIS — I10 Essential (primary) hypertension: Secondary | ICD-10-CM

## 2022-01-14 DIAGNOSIS — R7303 Prediabetes: Secondary | ICD-10-CM

## 2022-01-14 DIAGNOSIS — E782 Mixed hyperlipidemia: Secondary | ICD-10-CM

## 2022-01-15 LAB — HEMOGLOBIN A1C
Hgb A1c MFr Bld: 5.6 % of total Hgb (ref <5.7)
Mean Plasma Glucose: 114 mg/dL
eAG (mmol/L): 6.3 mmol/L

## 2022-01-15 LAB — COMPLETE METABOLIC PANEL WITH GFR
AG Ratio: 1.2 (calc) (ref 1.0–2.5)
ALT: 18 U/L (ref 6–29)
AST: 20 U/L (ref 10–35)
Albumin: 4 g/dL (ref 3.6–5.1)
Alkaline phosphatase (APISO): 127 U/L (ref 37–153)
BUN: 13 mg/dL (ref 7–25)
CO2: 28 mmol/L (ref 20–32)
Calcium: 9.2 mg/dL (ref 8.6–10.4)
Chloride: 104 mmol/L (ref 98–110)
Creat: 0.59 mg/dL (ref 0.50–1.05)
Globulin: 3.3 g/dL (calc) (ref 1.9–3.7)
Glucose, Bld: 93 mg/dL (ref 65–99)
Potassium: 4.9 mmol/L (ref 3.5–5.3)
Sodium: 141 mmol/L (ref 135–146)
Total Bilirubin: 0.4 mg/dL (ref 0.2–1.2)
Total Protein: 7.3 g/dL (ref 6.1–8.1)
eGFR: 97 mL/min/{1.73_m2} (ref >=60)

## 2022-01-15 LAB — CBC WITH DIFFERENTIAL/PLATELET
Absolute Monocytes: 524 cells/uL (ref 200–950)
Basophils Absolute: 7 cells/uL (ref 0–200)
Basophils Relative: 0.1 %
Eosinophils Absolute: 150 cells/uL (ref 15–500)
Eosinophils Relative: 2.2 %
HCT: 38.6 % (ref 35.0–45.0)
Hemoglobin: 13.1 g/dL (ref 11.7–15.5)
Lymphs Abs: 2060 cells/uL (ref 850–3900)
MCH: 31 pg (ref 27.0–33.0)
MCHC: 33.9 g/dL (ref 32.0–36.0)
MCV: 91.5 fL (ref 80.0–100.0)
MPV: 9.8 fL (ref 7.5–12.5)
Monocytes Relative: 7.7 %
Neutro Abs: 4060 cells/uL (ref 1500–7800)
Neutrophils Relative %: 59.7 %
Platelets: 301 10*3/uL (ref 140–400)
RBC: 4.22 10*6/uL (ref 3.80–5.10)
RDW: 13.1 % (ref 11.0–15.0)
Total Lymphocyte: 30.3 %
WBC: 6.8 10*3/uL (ref 3.8–10.8)

## 2022-01-15 LAB — LIPID PANEL
Cholesterol: 214 mg/dL — ABNORMAL HIGH (ref <200)
HDL: 55 mg/dL (ref >=50)
LDL Cholesterol (Calc): 130 mg/dL (calc) — ABNORMAL HIGH
Non-HDL Cholesterol (Calc): 159 mg/dL (calc) — ABNORMAL HIGH (ref <130)
Total CHOL/HDL Ratio: 3.9 (calc) (ref <5.0)
Triglycerides: 169 mg/dL — ABNORMAL HIGH (ref <150)

## 2022-01-15 LAB — TSH: TSH: 8 mIU/L — ABNORMAL HIGH (ref 0.40–4.50)

## 2022-04-23 ENCOUNTER — Ambulatory Visit: Payer: Medicare Other | Admitting: Podiatry

## 2022-05-07 ENCOUNTER — Ambulatory Visit: Payer: Medicare Other | Admitting: Podiatry

## 2022-05-07 DIAGNOSIS — L84 Corns and callosities: Secondary | ICD-10-CM

## 2022-05-07 DIAGNOSIS — M205X2 Other deformities of toe(s) (acquired), left foot: Secondary | ICD-10-CM | POA: Diagnosis not present

## 2022-05-07 DIAGNOSIS — B351 Tinea unguium: Secondary | ICD-10-CM | POA: Diagnosis not present

## 2022-05-07 NOTE — Patient Instructions (Signed)
I have ordered a medication for you that will come from [facility] in [place]. They should be calling you to verify insurance and will mail the medication to you. If you live close by then you can go by their pharmacy to pick up the medication. Their phone number is [phone]. If you do not hear from them in the next few days, please give us a call at [phone].   

## 2022-05-07 NOTE — Progress Notes (Unsigned)
Subjective:   Patient ID: Michelle Carrillo, female   DOB: 72 y.o.   MRN: [id number]   HPI Chief Complaint  Patient presents with   Callouses    Bilateral, nail fungus as well, prior treatment for fungus was in [place], treatment helped but came back    Callus on side of left 5th toe that hurts. It started about 3-4 months ago. No injuries. No treatment.  She keeps band-aids on that.   No treatment recently. Had treatment a long time ago, Lamisil. It helped.    Review of Systems  All other systems reviewed and are negative.  Past Medical History:  Diagnosis Date   Anxiety    on meds   Arthritis    bilateral knees   Cataract    RIGHT eye   Depression    on meds   Healthcare maintenance 06/20/2019   Herpes zoster kerstitis s/p corneal transplant 2015    History of strabismus surgery 03/31/2019   03/31/2019 1. Left medial rectus recession 6.68m to11.571mfrom limbus 2. Left medial rectus adjustable suture 3. Left inferior rectus 5.28m72mentral tenotomy 4. Left inferior rectus adjustable suture  Last Assessment & Plan:  03/31/2019 1. Left medial rectus recession 6.5mm63m11.5mm 728mm limbus 2. Left medial rectus adjustable suture 3. Left inferior rectus 5.28mm c17mral tenotomy 4. Left inferior r   Hypertension    on meds   Right knee osteoarthritis    Status post total right knee replacement 10/10/2020   Strabismus    due to eye injury as a child   Stroke (HCC) [id number]   stroke with craniotomy    Past Surgical History:  Procedure Laterality Date   CESAREAN SECTION      x 2    CHOLECYSTECTOMY     COLONOSCOPY  04/22/2020   CORNEAL TRANSPLANT Left    2006 and 2016   CRANIOTOMY Left 08/09/2019   Procedure: CRANIOTOMY HEMATOMA EVACUATION SUBDURAL;  Surgeon: CabbelAshok Pall Location: [facility] OR;Detmoldvice: Neurosurgery;  Laterality: Left;  left   EYE SURGERY     TOTAL KNEE ARTHROPLASTY Right 10/10/2020   Procedure: RIGHT TOTAL KNEE ARTHROPLASTY;  Surgeon: Xu, NaLeandrew Koyanagi  Location: [facility] OR;Terramuggusvice: Orthopedics;  Laterality: Right;   TUBAL LIGATION       Current Outpatient Medications:    acetaminophen (TYLENOL) 500 MG tablet, Take 500 mg by mouth every 6 (six) hours as needed., Disp: , Rfl:    amLODipine (NORVASC) 10 MG tablet, Take 1 tablet (10 mg total) by mouth daily., Disp: 90 tablet, Rfl: 1   aspirin EC 81 MG tablet, Take 1 tablet (81 mg total) by mouth daily. To be taken after surgery, Disp: 30 tablet, Rfl: 2   lisinopril (ZESTRIL) 20 MG tablet, Take 1 tablet (20 mg total) by mouth daily., Disp: 90 tablet, Rfl: 1   PARoxetine (PAXIL) 20 MG tablet, Take 1 tablet (20 mg total) by mouth daily., Disp: 90 tablet, Rfl: 2  No Known Allergies        Objective:  Physical Exam  General: AAO x3, NAD  Dermatological: corn on the left 5th toe. Nails   Vascular: Dorsalis Pedis artery and Posterior Tibial artery pedal pulses are 2/4 bilateral with immedate capillary fill time. Pedal hair growth present. No varicosities and no lower extremity edema present bilateral. There is no pain with calf compression, swelling, warmth, erythema.   Neruologic: Grossly intact via light touch bilateral. Vibratory intact via tuning fork bilateral. Protective threshold  with Semmes Wienstein monofilament intact to all pedal sites bilateral. Patellar and Achilles deep tendon reflexes 2+ bilateral. No Babinski or clonus noted bilateral.   Musculoskeletal: No gross boney pedal deformities bilateral. No pain, crepitus, or limitation noted with foot and ankle range of motion bilateral. Muscular strength 5/5 in all groups tested bilateral.  Gait: Unassisted, Nonantalgic.       Assessment:  ***     Plan:  -Treatment options discussed including all alternatives, risks, and complications -Etiology of symptoms were discussed -CC for nail fungus

## 2022-05-27 ENCOUNTER — Telehealth: Payer: Self-pay | Admitting: *Deleted

## 2022-05-27 NOTE — Telephone Encounter (Signed)
Daughter of patient is calling for the status of a compound cream that was supposed to be sent to [place], not there per their office, per epic notes it was sent. Can this be resent?

## 2022-07-01 ENCOUNTER — Encounter: Payer: Self-pay | Admitting: Family

## 2022-07-01 ENCOUNTER — Ambulatory Visit (INDEPENDENT_AMBULATORY_CARE_PROVIDER_SITE_OTHER): Payer: Medicare Other | Admitting: Family

## 2022-07-01 VITALS — BP 140/72 | HR 72 | Temp 97.8°F | Resp 16 | Ht 58.27 in | Wt 178.6 lb

## 2022-07-01 DIAGNOSIS — I10 Essential (primary) hypertension: Secondary | ICD-10-CM | POA: Diagnosis not present

## 2022-07-01 DIAGNOSIS — E782 Mixed hyperlipidemia: Secondary | ICD-10-CM | POA: Diagnosis not present

## 2022-07-01 DIAGNOSIS — B351 Tinea unguium: Secondary | ICD-10-CM | POA: Diagnosis not present

## 2022-07-01 DIAGNOSIS — Z23 Encounter for immunization: Secondary | ICD-10-CM

## 2022-07-01 DIAGNOSIS — R7303 Prediabetes: Secondary | ICD-10-CM | POA: Diagnosis not present

## 2022-07-01 DIAGNOSIS — L84 Corns and callosities: Secondary | ICD-10-CM

## 2022-07-01 DIAGNOSIS — F419 Anxiety disorder, unspecified: Secondary | ICD-10-CM | POA: Diagnosis not present

## 2022-07-01 DIAGNOSIS — F32A Depression, unspecified: Secondary | ICD-10-CM

## 2022-07-01 NOTE — Progress Notes (Signed)
Provider: [name] FNP-C   Michelle Carrillo, Michelle Bucks, NP  Patient Care Team: Michelle Carrillo, Michelle Bucks, NP as PCP - General (Family Medicine)  Extended Emergency Contact Information Primary Emergency Contact: Michelle Carrillo, [place] [address] [place] Phone: [phone] Relation: Daughter Secondary Emergency Contact: Michelle Carrillo Address: [address]          [place], [place] [address] [name] of [place] Phone: [phone] Mobile Phone: [phone] Relation: Spouse  Code Status:  Full Code  Goals of care: Advanced Directive information    01/04/2022    2:25 PM  Advanced Directives  Does Patient Have a Medical Advance Directive? No  Would patient like information on creating a medical advance directive? No - Patient declined     Chief Complaint  Patient presents with   Medical Management of Chronic Issues    Since stroke some days feels ok some days feels bad Wants labs and is fasting    HPI:  Pt is a 72 y.o. female seen today for 6 months follow up for  medical management of chronic diseases.She is here with daughter and Michelle Carrillo spanish Translator Michelle Carrillo.Has a medical history of Hypertension,Hyperlipidemia,Anxiety and depression,Prediabetes,Osteoarthritis,obesity among others.  States would like lab work done to check cholesterol. Due for PNA,shingles,COVID-19 and Tetanus vaccine.Agrees to get Prevnar 20 vaccine today.made aware to get shingles,COVID-19 and Tdap at the pharmacy.   Past Medical History:  Diagnosis Date   Anxiety    on meds   Arthritis    bilateral knees   Cataract    RIGHT eye   Depression    on meds   Healthcare maintenance 06/20/2019   Herpes zoster kerstitis s/p corneal transplant 2015    History of strabismus surgery 03/31/2019   03/31/2019 1. Left medial rectus recession 6.35mm to11.58mm from limbus 2. Left medial rectus adjustable suture 3. Left inferior rectus 5.81mm central tenotomy 4. Left  inferior rectus adjustable suture  Last Assessment & Plan:  03/31/2019 1. Left medial rectus recession 6.2mm to11.62mm from limbus 2. Left medial rectus adjustable suture 3. Left inferior rectus 5.49mm central tenotomy 4. Left inferior r   Hypertension    on meds   Right knee osteoarthritis    Status post total right knee replacement 10/10/2020   Strabismus    due to eye injury as a child   Stroke ([facility]) 07/2019   stroke with craniotomy   Past Surgical History:  Procedure Laterality Date   CESAREAN SECTION      x 2    CHOLECYSTECTOMY     COLONOSCOPY  04/22/2020   CORNEAL TRANSPLANT Left    2006 and 2016   CRANIOTOMY Left 08/09/2019   Procedure: CRANIOTOMY HEMATOMA EVACUATION SUBDURAL;  Surgeon: Ashok Pall, MD;  Location: [facility];  Service: Neurosurgery;  Laterality: Left;  left   EYE SURGERY     TOTAL KNEE ARTHROPLASTY Right 10/10/2020   Procedure: RIGHT TOTAL KNEE ARTHROPLASTY;  Surgeon: Leandrew Koyanagi, MD;  Location: [facility];  Service: Orthopedics;  Laterality: Right;   TUBAL LIGATION      No Known Allergies  Allergies as of 07/01/2022   No Known Allergies      Medication List        Accurate as of July 01, 2022 12:11 PM. If you have any questions, ask your nurse or doctor.          acetaminophen 500 MG tablet Commonly known as: TYLENOL Take 500  mg by mouth every 6 (six) hours as needed.   amLODipine 10 MG tablet Commonly known as: NORVASC Take 1 tablet (10 mg total) by mouth daily.   aspirin EC 81 MG tablet Take 1 tablet (81 mg total) by mouth daily. To be taken after surgery   lisinopril 20 MG tablet Commonly known as: ZESTRIL Take 1 tablet (20 mg total) by mouth daily.   PARoxetine 20 MG tablet Commonly known as: PAXIL Take 1 tablet (20 mg total) by mouth daily.        Review of Systems  Constitutional:  Negative for appetite change, chills, fatigue, fever and unexpected weight change.  HENT:  Negative for congestion, dental problem, ear discharge, ear  pain, facial swelling, hearing loss, nosebleeds, postnasal drip, rhinorrhea, sinus pressure, sinus pain, sneezing, sore throat, tinnitus and trouble swallowing.   Eyes:  Positive for visual disturbance. Negative for pain, discharge, redness and itching.       Wears eye glasses   Respiratory:  Negative for cough, chest tightness, shortness of breath and wheezing.   Cardiovascular:  Negative for chest pain, palpitations and leg swelling.  Gastrointestinal:  Negative for abdominal distention, abdominal pain, blood in stool, constipation, diarrhea, nausea and vomiting.  Endocrine: Negative for cold intolerance, heat intolerance, polydipsia, polyphagia and polyuria.  Genitourinary:  Negative for difficulty urinating, dysuria, flank pain, frequency and urgency.  Musculoskeletal:  Positive for arthralgias and gait problem. Negative for back pain, joint swelling, myalgias, neck pain and neck stiffness.       Right knee pain   Skin:  Negative for color change, pallor, rash and wound.  Neurological:  Negative for dizziness, syncope, speech difficulty, weakness, light-headedness, numbness and headaches.  Hematological:  Does not bruise/bleed easily.  Psychiatric/Behavioral:  Negative for agitation, behavioral problems, confusion, hallucinations, self-injury, sleep disturbance and suicidal ideas. The patient is not nervous/anxious.     Immunization History  Administered Date(s) Administered   Influenza,inj,Quad PF,6+ Mos 01/09/2020   Moderna Sars-Covid-2 Vaccination 06/14/2019, 07/17/2019   Pneumococcal Conjugate-13 04/23/2020   Pertinent  Health Maintenance Due  Topic Date Due   INFLUENZA VACCINE  11/10/2021   MAMMOGRAM  12/23/2022   COLONOSCOPY (Pts 45-43yrs Insurance coverage will need to be confirmed)  04/23/2023   DEXA SCAN  Completed      02/23/2021    2:51 PM 06/24/2021    1:53 PM 07/23/2021    2:35 PM 01/04/2022    2:26 PM 07/01/2022   11:28 AM  Fall Risk  Falls in the past year? 0 0 0 0  0  Was there an injury with Fall? 0 0 0 0 0  Fall Risk Category Calculator 0 0 0 0 0  Fall Risk Category (Retired) Low Low Low Low   (RETIRED) Patient Fall Risk Level Low fall risk Low fall risk Low fall risk Low fall risk   Patient at Risk for Falls Due to No Fall Risks  No Fall Risks No Fall Risks   Fall risk Follow up Falls evaluation completed;Falls prevention discussed Falls evaluation completed Falls evaluation completed;Falls prevention discussed Falls evaluation completed    Functional Status Survey:    Vitals:   07/01/22 1125  BP: (!) 140/72  Pulse: 72  Resp: 16  Temp: 97.8 F (36.6 C)  TempSrc: Temporal  SpO2: 97%  Weight: 178 lb 9.6 oz (81 kg)  Height: 4' 10.27" (1.48 m)   Body mass index is 36.98 kg/m. Physical Exam Vitals reviewed.  Constitutional:      General: She  is not in acute distress.    Appearance: Normal appearance. She is obese. She is not ill-appearing or diaphoretic.  HENT:     Head: Normocephalic.     Right Ear: Tympanic membrane, ear canal and external ear normal. There is no impacted cerumen.     Left Ear: Tympanic membrane, ear canal and external ear normal. There is no impacted cerumen.     Nose: Nose normal. No congestion or rhinorrhea.     Mouth/Throat:     Mouth: Mucous membranes are moist.     Pharynx: Oropharynx is clear. No oropharyngeal exudate or posterior oropharyngeal erythema.  Eyes:     General: No scleral icterus.       Right eye: No discharge.        Left eye: No discharge.     Extraocular Movements: Extraocular movements intact.     Conjunctiva/sclera: Conjunctivae normal.     Pupils: Pupils are equal, round, and reactive to light.  Neck:     Vascular: No carotid bruit.  Cardiovascular:     Rate and Rhythm: Normal rate and regular rhythm.     Pulses: Normal pulses.     Heart sounds: Normal heart sounds. No murmur heard.    No friction rub. No gallop.  Pulmonary:     Effort: Pulmonary effort is normal. No respiratory  distress.     Breath sounds: Normal breath sounds. No wheezing, rhonchi or rales.  Chest:     Chest wall: No tenderness.  Abdominal:     General: Bowel sounds are normal. There is no distension.     Palpations: Abdomen is soft. There is no mass.     Tenderness: There is no abdominal tenderness. There is no right CVA tenderness, left CVA tenderness, guarding or rebound.  Musculoskeletal:        General: No swelling or tenderness. Normal range of motion.     Cervical back: Normal range of motion. No rigidity or tenderness.     Right lower leg: No edema.     Left lower leg: No edema.     Comments: Right knee old surgical incision intact   Feet:     Right foot:     Skin integrity: Callus present. No ulcer, skin breakdown, erythema, warmth, dry skin or fissure.     Toenail Condition: Fungal disease present.    Left foot:     Skin integrity: Callus present. No ulcer, skin breakdown, erythema, warmth, dry skin or fissure.     Toenail Condition: Fungal disease present. Lymphadenopathy:     Cervical: No cervical adenopathy.  Skin:    General: Skin is warm and dry.     Coloration: Skin is not pale.     Findings: No bruising, erythema, lesion or rash.  Neurological:     Mental Status: She is alert and oriented to person, place, and time.     Cranial Nerves: No cranial nerve deficit.     Sensory: No sensory deficit.     Motor: No weakness.     Coordination: Coordination normal.     Gait: Gait abnormal.  Psychiatric:        Mood and Affect: Mood normal.        Speech: Speech normal.        Behavior: Behavior normal.        Thought Content: Thought content normal.        Judgment: Judgment normal.     Labs reviewed: Recent Labs    01/14/22 0915  NA 141  K 4.9  CL 104  CO2 28  GLUCOSE 93  BUN 13  CREATININE 0.59  CALCIUM 9.2   Recent Labs    01/14/22 0915  AST 20  ALT 18  BILITOT 0.4  PROT 7.3   Recent Labs    01/14/22 0915  WBC 6.8  NEUTROABS 4,060  HGB 13.1   HCT 38.6  MCV 91.5  PLT 301   Lab Results  Component Value Date   TSH 8.00 (H) 01/14/2022   Lab Results  Component Value Date   HGBA1C 5.6 01/14/2022   Lab Results  Component Value Date   CHOL 214 (H) 01/14/2022   HDL 55 01/14/2022   LDLCALC 130 (H) 01/14/2022   TRIG 169 (H) 01/14/2022   CHOLHDL 3.9 01/14/2022    Significant Diagnostic Results in last 30 days:  No results found.  Assessment/Plan 1.  Essential hypertension Blood pressure not at goal slightly elevated this visit.Will have an monitor and notify provide if greater than 140/90 -Continue on amlodipine and lisinopril - TSH - COMPLETE METABOLIC PANEL WITH GFR - CBC with Differential/Platelet  2. Mixed hyperlipidemia Previous total cholesterol, triglycerides and LDL were high.Recommend a low saturated fats, low carbohydrates and high vegetable diet also exercise at least 3 times per week for 30 minutes. - will obtain lab work then consider starting on a statin - Lipid panel  3. Anxiety and depression Mood stable.  Reports feeling depressed at times. Continue Paxil and monitor  4. Prediabetes Lab Results  Component Value Date   HGBA1C 5.6 01/14/2022  Has improved Dietary modification and exercise advised as above - Hemoglobin A1c - Ambulatory referral to Podiatry  5. Onychomycosis Bilateral great toe feet peeling - Ambulatory referral to Podiatry  6. Callus of foot Left fifth toe and right great toe. Will have him evaluated by podiatrist - Ambulatory referral to Podiatry  7. Need for pneumococcal 20-valent conjugate vaccination Prevnar 20 vaccine administered today by CMA no reaction reported.  - Pneumococcal conjugate vaccine 20-valent (Prevnar 20)  Family/ staff Communication: Reviewed plan of care with patient and daughter verbalized understanding.  Labs/tests ordered:  - CBC with Differential/Platelet - CMP with eGFR([facility]) - TSH - Hgb A1C - Lipid panel  Next Appointment : Return in  about 6 months (around 01/01/2023) for medical mangement of chronic issues. Also schedule Annual Wellness visit .  Sandrea Hughs, NP

## 2022-07-01 NOTE — Patient Instructions (Signed)
-   Please get shingles and Tetanus vaccine at your pharmacy

## 2022-07-02 ENCOUNTER — Other Ambulatory Visit: Payer: Self-pay | Admitting: Nurse Practitioner

## 2022-07-02 LAB — CBC WITH DIFFERENTIAL/PLATELET
Absolute Monocytes: 490 cells/uL (ref 200–950)
Basophils Absolute: 7 cells/uL (ref 0–200)
Basophils Relative: 0.1 %
Eosinophils Absolute: 77 cells/uL (ref 15–500)
Eosinophils Relative: 1.1 %
HCT: 41.1 % (ref 35.0–45.0)
Hemoglobin: 13.9 g/dL (ref 11.7–15.5)
Lymphs Abs: 2009 cells/uL (ref 850–3900)
MCH: 31.1 pg (ref 27.0–33.0)
MCHC: 33.8 g/dL (ref 32.0–36.0)
MCV: 91.9 fL (ref 80.0–100.0)
MPV: 9.6 fL (ref 7.5–12.5)
Monocytes Relative: 7 %
Neutro Abs: 4417 cells/uL (ref 1500–7800)
Neutrophils Relative %: 63.1 %
Platelets: 311 10*3/uL (ref 140–400)
RBC: 4.47 10*6/uL (ref 3.80–5.10)
RDW: 12.7 % (ref 11.0–15.0)
Total Lymphocyte: 28.7 %
WBC: 7 10*3/uL (ref 3.8–10.8)

## 2022-07-02 LAB — COMPLETE METABOLIC PANEL WITH GFR
AG Ratio: 1.2 (calc) (ref 1.0–2.5)
ALT: 24 U/L (ref 6–29)
AST: 24 U/L (ref 10–35)
Albumin: 4.1 g/dL (ref 3.6–5.1)
Alkaline phosphatase (APISO): 142 U/L (ref 37–153)
BUN/Creatinine Ratio: 23 (calc) — ABNORMAL HIGH (ref 6–22)
BUN: 13 mg/dL (ref 7–25)
CO2: 28 mmol/L (ref 20–32)
Calcium: 9.2 mg/dL (ref 8.6–10.4)
Chloride: 103 mmol/L (ref 98–110)
Creat: 0.57 mg/dL — ABNORMAL LOW (ref 0.60–1.00)
Globulin: 3.4 g/dL (calc) (ref 1.9–3.7)
Glucose, Bld: 90 mg/dL (ref 65–99)
Potassium: 4.4 mmol/L (ref 3.5–5.3)
Sodium: 141 mmol/L (ref 135–146)
Total Bilirubin: 0.4 mg/dL (ref 0.2–1.2)
Total Protein: 7.5 g/dL (ref 6.1–8.1)
eGFR: 98 mL/min/{1.73_m2} (ref >=60)

## 2022-07-02 LAB — TSH: TSH: 5.93 mIU/L — ABNORMAL HIGH (ref 0.40–4.50)

## 2022-07-02 LAB — LIPID PANEL
Cholesterol: 222 mg/dL — ABNORMAL HIGH (ref <200)
HDL: 61 mg/dL (ref >=50)
LDL Cholesterol (Calc): 130 mg/dL (calc) — ABNORMAL HIGH
Non-HDL Cholesterol (Calc): 161 mg/dL (calc) — ABNORMAL HIGH (ref <130)
Total CHOL/HDL Ratio: 3.6 (calc) (ref <5.0)
Triglycerides: 177 mg/dL — ABNORMAL HIGH (ref <150)

## 2022-07-02 LAB — HEMOGLOBIN A1C
Hgb A1c MFr Bld: 6 % of total Hgb — ABNORMAL HIGH (ref <5.7)
Mean Plasma Glucose: 126 mg/dL
eAG (mmol/L): 7 mmol/L

## 2022-07-02 MED ORDER — ATORVASTATIN CALCIUM 10 MG PO TABS: ORAL_TABLET | ORAL | 3 refills | Status: DC

## 2022-07-02 NOTE — Progress Notes (Unsigned)
Lipitor sent to pharmacy to follow up with dinah in 6 weeks due to new medication start

## 2022-07-05 NOTE — Progress Notes (Signed)
Patient has upcoming appointment in April with PCP Michelle Carrillo, Michelle Bucks, NP.

## 2022-07-13 ENCOUNTER — Encounter: Payer: Medicare Other | Admitting: Family

## 2022-07-15 ENCOUNTER — Other Ambulatory Visit: Payer: Self-pay | Admitting: Family

## 2022-07-15 DIAGNOSIS — I10 Essential (primary) hypertension: Secondary | ICD-10-CM

## 2022-07-29 ENCOUNTER — Ambulatory Visit: Payer: Medicare Other | Admitting: Family

## 2022-08-01 NOTE — Progress Notes (Unsigned)
Office Visit Note   Patient: Michelle Carrillo           Date of Birth: [birth year]           MRN: [id number] Visit Date: 08/03/2022              Requested by: Caesar Bookman, NP [address] [place],  [place] [address] PCP: Caesar Bookman, NP   Assessment & Plan: Visit Diagnoses: No diagnosis found.  Plan: ***  Follow-Up Instructions: No follow-ups on file.   Orders:  No orders of the defined types were placed in this encounter.  No orders of the defined types were placed in this encounter.     Procedures: No procedures performed   Clinical Data: No additional findings.   Subjective: No chief complaint on file.   HPI  Review of Systems  Constitutional: Negative.   HENT: Negative.    Eyes: Negative.   Respiratory: Negative.    Cardiovascular: Negative.   Endocrine: Negative.   Musculoskeletal: Negative.   Neurological: Negative.   Hematological: Negative.   Psychiatric/Behavioral: Negative.    All other systems reviewed and are negative.   Objective: Vital Signs: There were no vitals taken for this visit.  Physical Exam Vitals and nursing note reviewed.  Constitutional:      Appearance: She is well-developed.  HENT:     Head: Normocephalic and atraumatic.  Pulmonary:     Effort: Pulmonary effort is normal.  Abdominal:     Palpations: Abdomen is soft.  Musculoskeletal:     Cervical back: Neck supple.  Skin:    General: Skin is warm.     Capillary Refill: Capillary refill takes less than 2 seconds.  Neurological:     Mental Status: She is alert and oriented to person, place, and time.  Psychiatric:        Behavior: Behavior normal.        Thought Content: Thought content normal.        Judgment: Judgment normal.   Ortho Exam  Specialty Comments:  No specialty comments available.  Imaging: No results found.   PMFS History: Patient Active Problem List   Diagnosis Date Noted  . Prediabetes 07/25/2021  . Postmenopausal  bleeding 06/24/2021  . Vitamin D deficiency 04/24/2020  . Postprandial RUQ pain 01/14/2020  . Hyperlipemia 08/23/2019  . ICH (intracerebral hemorrhage) ([facility]) L parietal w/ L SDH s/p crani 08/09/2019  . Onychomycosis 04/03/2019  . Moderate, recurrent MDD 06/06/2018  . 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 Primary osteoarthritis of right knee 06/05/2018  . Essential hypertension 06/05/2018  . Anxiety 06/05/2018  . Age-related nuclear cataract of both eyes 11/26/2014  . Diplopia 11/26/2014  . Monocular esotropia of left eye 11/26/2014   Past Medical History:  Diagnosis Date  . Anxiety    on meds  . Arthritis    bilateral knees  . Cataract    RIGHT eye  . Depression    on meds  . Healthcare maintenance 06/20/2019  . Herpes zoster kerstitis s/p corneal transplant 2015   . History of strabismus surgery 03/31/2019   03/31/2019 1. Left medial rectus recession 6.31mm to11.19mm from limbus 2. Left medial rectus adjustable suture 3. Left inferior rectus 5.68mm central tenotomy 4. Left inferior rectus adjustable suture  Last Assessment & Plan:  03/31/2019 1. Left medial rectus recession 6.77mm to11.70mm from limbus 2. Left medial rectus adjustable suture 3. Left inferior rectus 5.71mm central tenotomy 4.  Left inferior r  . Hypertension    on meds  . Right knee osteoarthritis   . Status post total right knee replacement 10/10/2020  . Strabismus    due to eye injury as a child  . Stroke [facility]) 07/2019   stroke with craniotomy    Family History  Problem Relation Age of Onset  . Diabetes Mother   . Ovarian cancer Mother   . High blood pressure Sister   . Diabetes Sister   . Diabetes Brother   . Stomach cancer Maternal Grandmother 77  . Colon polyps Neg Hx   . Colon cancer Neg Hx   . Rectal cancer Neg Hx   . Esophageal cancer Neg Hx     Past Surgical History:  Procedure Laterality Date  . CESAREAN SECTION      x 2   . CHOLECYSTECTOMY    .  COLONOSCOPY  04/22/2020  . CORNEAL TRANSPLANT Left    2006 and 2016  . CRANIOTOMY Left 08/09/2019   Procedure: CRANIOTOMY HEMATOMA EVACUATION SUBDURAL;  Surgeon: Coletta Memos, MD;  Location: [facility];  Service: Neurosurgery;  Laterality: Left;  left  . EYE SURGERY    . TOTAL KNEE ARTHROPLASTY Right 10/10/2020   Procedure: RIGHT TOTAL KNEE ARTHROPLASTY;  Surgeon: Tarry Kos, MD;  Location: [facility];  Service: Orthopedics;  Laterality: Right;  . TUBAL LIGATION     Social History   Occupational History  . Not on file  Tobacco Use  . Smoking status: Never  . Smokeless tobacco: Never  Vaping Use  . Vaping Use: Never used  Substance and Sexual Activity  . Alcohol use: Never  . Drug use: Never  . Sexual activity: Not on file

## 2022-08-03 ENCOUNTER — Other Ambulatory Visit (INDEPENDENT_AMBULATORY_CARE_PROVIDER_SITE_OTHER): Payer: Medicare Other

## 2022-08-03 ENCOUNTER — Encounter: Payer: Self-pay | Admitting: Orthopaedic Surgery

## 2022-08-03 ENCOUNTER — Ambulatory Visit: Payer: Medicare Other | Admitting: Orthopaedic Surgery

## 2022-08-03 DIAGNOSIS — M5416 Radiculopathy, lumbar region: Secondary | ICD-10-CM | POA: Diagnosis not present

## 2022-08-03 DIAGNOSIS — M545 Low back pain, unspecified: Secondary | ICD-10-CM

## 2022-08-03 DIAGNOSIS — G8929 Other chronic pain: Secondary | ICD-10-CM

## 2022-08-03 MED ORDER — PREDNISONE 10 MG (21) PO TBPK: ORAL_TABLET | ORAL | 3 refills | Status: DC

## 2022-08-05 ENCOUNTER — Ambulatory Visit: Payer: Medicare Other | Admitting: Orthopaedic Surgery

## 2022-08-19 ENCOUNTER — Ambulatory Visit (INDEPENDENT_AMBULATORY_CARE_PROVIDER_SITE_OTHER): Payer: Medicare Other | Admitting: Family

## 2022-08-19 ENCOUNTER — Encounter: Payer: Self-pay | Admitting: Family

## 2022-08-19 VITALS — BP 118/72 | HR 76 | Temp 96.9°F | Ht 58.27 in | Wt 180.4 lb

## 2022-08-19 DIAGNOSIS — F419 Anxiety disorder, unspecified: Secondary | ICD-10-CM | POA: Diagnosis not present

## 2022-08-19 DIAGNOSIS — E782 Mixed hyperlipidemia: Secondary | ICD-10-CM | POA: Diagnosis not present

## 2022-08-19 DIAGNOSIS — F32A Depression, unspecified: Secondary | ICD-10-CM | POA: Diagnosis not present

## 2022-08-19 DIAGNOSIS — I10 Essential (primary) hypertension: Secondary | ICD-10-CM | POA: Diagnosis not present

## 2022-08-19 DIAGNOSIS — R7303 Prediabetes: Secondary | ICD-10-CM

## 2022-08-19 MED ORDER — PAROXETINE HCL 20 MG PO TABS: 20.0000 mg | ORAL_TABLET | Freq: Every day | ORAL | 2 refills | Status: DC

## 2022-08-19 MED ORDER — LISINOPRIL 20 MG PO TABS
20.0000 mg | ORAL_TABLET | Freq: Every day | ORAL | 1 refills | Status: DC
Start: 2022-08-19 — End: 2023-07-04

## 2022-08-19 MED ORDER — ATORVASTATIN CALCIUM 10 MG PO TABS: ORAL_TABLET | ORAL | 3 refills | Status: DC

## 2022-08-19 MED ORDER — AMLODIPINE BESYLATE 10 MG PO TABS: 10.0000 mg | ORAL_TABLET | Freq: Every day | ORAL | 1 refills | Status: DC

## 2022-08-19 NOTE — Progress Notes (Signed)
Provider: Richarda Blade FNP-C   Michelle Carrillo, Michelle Citrin, NP  Patient Care Team: Lenora Gomes, Michelle Citrin, NP as PCP - General (Family Medicine)  Extended Emergency Contact Information Primary Emergency Contact: [name], [name] [place] Phone: [phone] Relation: Daughter Secondary Emergency Contact: [name], [name] Home Phone: [phone] Mobile Phone: [phone] Relation: Spouse  Code Status:  Full Code  Goals of care: Advanced Directive information    01/04/2022    2:25 PM  Advanced Directives  Does Patient Have a Medical Advance Directive? No  Would patient like information on creating a medical advance directive? No - Patient declined     Chief Complaint  Patient presents with   Medical Management of Chronic Issues    Patient presents today for a 6 month follow-up   Quality Metric Gaps    AWV,TDAP,zoster,COVID#3    HPI:  Pt is a 72 y.o. female seen today for 6 months follow up for medical management of chronic diseases.  Has some medical history of hypertension, hyperlipidemia, generalized anxiety disorder, history of stroke in 2000 affected left eye peripheral vision, osteoarthritis, major depression, prediabetes among others. She is here today with her husband and daughter who provides additional HPI information. She denies any acute issues this visit. Recent lab results reviewed and discussed during visit. Hemoglobin A1c 6.0 previous was 5.6  Total cholesterol high 222, triglycerides 177 and LDL 130.  Total cholesterol and triglycerides are higher than previous level. Does not do any form of exercise but walks once in a while. Has had 7 pounds weight gain since last seen 2 months ago.states will be travelling to [place] soon will be walking most of the time when visiting. Reminded to get tetanus, shingles and COVID vaccines at the  pharmacy. Will also schedule for Medicare annual wellness visit.  Past Medical History:  Diagnosis Date   Anxiety    on meds   Arthritis    bilateral knees   Cataract    RIGHT eye   Depression    on meds   Healthcare maintenance 06/20/2019   Herpes zoster kerstitis s/p corneal transplant 2015    History of strabismus surgery 03/31/2019   03/31/2019 1. Left medial rectus recession 6.70mm to11.22mm from limbus 2. Left medial rectus adjustable suture 3. Left inferior rectus 5.4mm central tenotomy 4. Left inferior rectus adjustable suture  Last Assessment & Plan:  03/31/2019 1. Left medial rectus recession 6.46mm to11.90mm from limbus 2. Left medial rectus adjustable suture 3. Left inferior rectus 5.34mm central tenotomy 4. Left inferior r   Hypertension    on meds   Right knee osteoarthritis    Status post total right knee replacement 10/10/2020   Strabismus    due to eye injury as a child   Stroke (HCC) 07/2019   stroke with craniotomy   Past Surgical History:  Procedure Laterality Date   CESAREAN SECTION      x 2    CHOLECYSTECTOMY     COLONOSCOPY  04/22/2020   CORNEAL TRANSPLANT Left    2006 and 2016   CRANIOTOMY Left 08/09/2019   Procedure: CRANIOTOMY HEMATOMA EVACUATION SUBDURAL;  Surgeon: Coletta Memos, MD;  Location: [facility] OR;  Service: Neurosurgery;  Laterality: Left;  left   EYE SURGERY     TOTAL KNEE ARTHROPLASTY Right 10/10/2020   Procedure: RIGHT TOTAL KNEE  ARTHROPLASTY;  Surgeon: Tarry Kos, MD;  Location: [facility];  Service: Orthopedics;  Laterality: Right;   TUBAL LIGATION      No Known Allergies  Allergies as of 08/19/2022   No Known Allergies      Medication List        Accurate as of Aug 19, 2022  3:34 PM. If you have any questions, ask your nurse or doctor.          acetaminophen 500 MG tablet Commonly known as: TYLENOL Take 500 mg by mouth every 6 (six) hours as needed.   amLODipine 10 MG tablet Commonly known as: NORVASC Take 1 tablet by mouth once  daily   aspirin EC 81 MG tablet Take 1 tablet (81 mg total) by mouth daily. To be taken after surgery   atorvastatin 10 MG tablet Commonly known as: LIPITOR Half tablet daily at bedtime for 2 week then increase to 1 tablet daily at bedtime   lisinopril 20 MG tablet Commonly known as: ZESTRIL Take 1 tablet (20 mg total) by mouth daily.   PARoxetine 20 MG tablet Commonly known as: PAXIL Take 1 tablet (20 mg total) by mouth daily.   predniSONE 10 MG (21) Tbpk tablet Commonly known as: STERAPRED UNI-PAK 21 TAB Take as directed        Review of Systems  Constitutional:  Negative for appetite change, chills, fatigue, fever and unexpected weight change.  HENT:  Negative for congestion, dental problem, ear discharge, ear pain, facial swelling, hearing loss, nosebleeds, postnasal drip, rhinorrhea, sinus pressure, sinus pain, sneezing, sore throat, tinnitus and trouble swallowing.   Eyes:  Positive for visual disturbance. Negative for pain, discharge, redness and itching.       Follows up with ophthalmologist   Respiratory:  Negative for cough, chest tightness, shortness of breath and wheezing.   Cardiovascular:  Negative for chest pain, palpitations and leg swelling.  Gastrointestinal:  Negative for abdominal distention, abdominal pain, blood in stool, constipation, diarrhea, nausea and vomiting.  Endocrine: Negative for cold intolerance, heat intolerance, polydipsia, polyphagia and polyuria.  Genitourinary:  Negative for difficulty urinating, dysuria, flank pain, frequency and urgency.  Musculoskeletal:  Positive for arthralgias. Negative for back pain, gait problem, joint swelling, myalgias, neck pain and neck stiffness.  Skin:  Negative for color change, pallor, rash and wound.  Neurological:  Negative for dizziness, syncope, speech difficulty, weakness, light-headedness, numbness and headaches.  Hematological:  Does not bruise/bleed easily.  Psychiatric/Behavioral:  Negative for  agitation, behavioral problems, confusion, hallucinations, self-injury, sleep disturbance and suicidal ideas. The patient is not nervous/anxious.     Immunization History  Administered Date(s) Administered   Influenza,inj,Quad PF,6+ Mos 01/09/2020   Moderna Sars-Covid-2 Vaccination 06/14/2019, 07/17/2019   PNEUMOCOCCAL CONJUGATE-20 07/01/2022   Pneumococcal Conjugate-13 04/23/2020   Pertinent  Health Maintenance Due  Topic Date Due   INFLUENZA VACCINE  11/11/2022   MAMMOGRAM  12/23/2022   COLONOSCOPY (Pts 45-84yrs Insurance coverage will need to be confirmed)  04/23/2023   DEXA SCAN  Completed      06/24/2021    1:53 PM 07/23/2021    2:35 PM 01/04/2022    2:26 PM 07/01/2022   11:28 AM 08/19/2022    3:19 PM  Fall Risk  Falls in the past year? 0 0 0 0 0  Was there an injury with Fall? 0 0 0 0 0  Fall Risk Category Calculator 0 0 0 0 0  Fall Risk Category (Retired) Low Low Low    (RETIRED)  Patient Fall Risk Level Low fall risk Low fall risk Low fall risk    Patient at Risk for Falls Due to  No Fall Risks No Fall Risks  No Fall Risks  Fall risk Follow up Falls evaluation completed Falls evaluation completed;Falls prevention discussed Falls evaluation completed  Falls evaluation completed   Functional Status Survey:    Vitals:   08/19/22 1509  BP: 118/72  Pulse: 76  Temp: (!) 96.9 F (36.1 C)  SpO2: 96%  Weight: 180 lb 6.4 oz (81.8 kg)  Height: 4' 10.27" (1.48 m)   Body mass index is 37.36 kg/m. Physical Exam Vitals reviewed.  Constitutional:      General: She is not in acute distress.    Appearance: Normal appearance. She is obese. She is not ill-appearing or diaphoretic.  HENT:     Head: Normocephalic.     Right Ear: Tympanic membrane, ear canal and external ear normal. There is no impacted cerumen.     Left Ear: Tympanic membrane, ear canal and external ear normal. There is no impacted cerumen.     Nose: Nose normal. No congestion or rhinorrhea.     Mouth/Throat:      Mouth: Mucous membranes are moist.     Pharynx: Oropharynx is clear. No oropharyngeal exudate or posterior oropharyngeal erythema.  Eyes:     General: No scleral icterus.       Right eye: No discharge.        Left eye: No discharge.     Extraocular Movements: Extraocular movements intact.     Conjunctiva/sclera: Conjunctivae normal.     Pupils: Pupils are equal, round, and reactive to light.  Neck:     Vascular: No carotid bruit.  Cardiovascular:     Rate and Rhythm: Normal rate and regular rhythm.     Pulses: Normal pulses.     Heart sounds: Normal heart sounds. No murmur heard.    No friction rub. No gallop.  Pulmonary:     Effort: Pulmonary effort is normal. No respiratory distress.     Breath sounds: Normal breath sounds. No wheezing, rhonchi or rales.  Chest:     Chest wall: No tenderness.  Abdominal:     General: Bowel sounds are normal. There is no distension.     Palpations: Abdomen is soft. There is no mass.     Tenderness: There is no abdominal tenderness. There is no right CVA tenderness, left CVA tenderness, guarding or rebound.  Musculoskeletal:        General: No swelling or tenderness. Normal range of motion.     Cervical back: Normal range of motion. No rigidity or tenderness.     Right lower leg: No edema.     Left lower leg: No edema.  Lymphadenopathy:     Cervical: No cervical adenopathy.  Skin:    General: Skin is warm and dry.     Coloration: Skin is not pale.     Findings: No bruising, erythema, lesion or rash.  Neurological:     Mental Status: She is alert and oriented to person, place, and time.     Cranial Nerves: No cranial nerve deficit.     Sensory: No sensory deficit.     Motor: No weakness.     Coordination: Coordination normal.     Gait: Gait normal.  Psychiatric:        Mood and Affect: Mood normal.        Speech: Speech normal.  Behavior: Behavior normal.        Thought Content: Thought content normal.        Judgment: Judgment  normal.     Labs reviewed: Recent Labs    01/14/22 0915 07/01/22 1219  NA 141 141  K 4.9 4.4  CL 104 103  CO2 28 28  GLUCOSE 93 90  BUN 13 13  CREATININE 0.59 0.57*  CALCIUM 9.2 9.2   Recent Labs    01/14/22 0915 07/01/22 1219  AST 20 24  ALT 18 24  BILITOT 0.4 0.4  PROT 7.3 7.5   Recent Labs    01/14/22 0915 07/01/22 1219  WBC 6.8 7.0  NEUTROABS 4,060 4,417  HGB 13.1 13.9  HCT 38.6 41.1  MCV 91.5 91.9  PLT 301 311   Lab Results  Component Value Date   TSH 5.93 (H) 07/01/2022   Lab Results  Component Value Date   HGBA1C 6.0 (H) 07/01/2022   Lab Results  Component Value Date   CHOL 222 (H) 07/01/2022   HDL 61 07/01/2022   LDLCALC 130 (H) 07/01/2022   TRIG 177 (H) 07/01/2022   CHOLHDL 3.6 07/01/2022    Significant Diagnostic Results in last 30 days:  XR Lumbar Spine 2-3 Views  Result Date: 08/03/2022 X-rays of the lumbar spine show degenerative scoliosis.  Diffuse degenerative changes within the facet joints and the intervertebral spaces.   Assessment/Plan  1. Essential hypertension B/p well controlled  -Continue on amlodipine and lisinopril -On aspirin and statin for cardiovascular event prevention -Dietary modification and exercise at least 3 times per week for 30 minutes discussed at length. - amLODipine (NORVASC) 10 MG tablet; Take 1 tablet (10 mg total) by mouth daily.  Dispense: 90 tablet; Refill: 1 - lisinopril (ZESTRIL) 20 MG tablet; Take 1 tablet (20 mg total) by mouth daily.  Dispense: 90 tablet; Refill: 1 - COMPLETE METABOLIC PANEL WITH GFR; Future - CBC with Differential/Platelet; Future  2. Prediabetes Lab Results  Component Value Date   HGBA1C 6.0 (H) 07/01/2022  Dietary modification and exercise advised as above - Hemoglobin A1c; Future  3. Anxiety and depression Mood stable -Continue on Paxil - TSH; Future - PARoxetine (PAXIL) 20 MG tablet; Take 1 tablet (20 mg total) by mouth daily.  Dispense: 90 tablet; Refill: 2  4.  Mixed hyperlipidemia Total cholesterol, triglycerides and LDL not at goal -Atorvastatin 10 mg daily -Dietary modification and exercise advised - Lipid panel; Future  Family/ staff Communication: Reviewed plan of care with patient, husband and daughter verbalized understanding  Labs/tests ordered:  - COMPLETE METABOLIC PANEL WITH GFR; Future - CBC with Differential/Platelet; Future - Hemoglobin A1c; Future - TSH; Future - Lipid panel; Future  Next Appointment : Return in about 3 months (around 11/19/2022) for medical mangement of chronic issues., fasting labs prior to visit TSH in 2 months .   Caesar Bookman, NP

## 2022-08-25 ENCOUNTER — Ambulatory Visit: Payer: Medicare Other | Admitting: Orthopaedic Surgery

## 2022-08-31 ENCOUNTER — Encounter: Payer: Self-pay | Admitting: Physical Therapy

## 2022-08-31 ENCOUNTER — Ambulatory Visit (INDEPENDENT_AMBULATORY_CARE_PROVIDER_SITE_OTHER): Payer: Medicare Other | Admitting: Physical Therapy

## 2022-08-31 DIAGNOSIS — R262 Difficulty in walking, not elsewhere classified: Secondary | ICD-10-CM

## 2022-08-31 DIAGNOSIS — M5459 Other low back pain: Secondary | ICD-10-CM | POA: Diagnosis not present

## 2022-08-31 NOTE — Therapy (Signed)
OUTPATIENT PHYSICAL THERAPY THORACOLUMBAR EVALUATION   Patient Name: Michelle Carrillo MRN: [id number] DOB:1950/05/15, 72 y.o., female Today's Date: 08/31/2022  END OF SESSION:  PT End of Session - 08/31/22 1534     Visit Number 1    Number of Visits 20    Date for PT Re-Evaluation 11/12/22    Progress Note Due on Visit 10    PT Start Time 1528    PT Stop Time 1610    PT Time Calculation (min) 42 min    Activity Tolerance Patient tolerated treatment well    Behavior During Therapy [facility] for tasks assessed/performed             Past Medical History:  Diagnosis Date   Anxiety    on meds   Arthritis    bilateral knees   Cataract    RIGHT eye   Depression    on meds   Healthcare maintenance 06/20/2019   Herpes zoster kerstitis s/p corneal transplant 2015    History of strabismus surgery 03/31/2019   03/31/2019 1. Left medial rectus recession 6.67mm to11.46mm from limbus 2. Left medial rectus adjustable suture 3. Left inferior rectus 5.39mm central tenotomy 4. Left inferior rectus adjustable suture  Last Assessment & Plan:  03/31/2019 1. Left medial rectus recession 6.41mm to11.62mm from limbus 2. Left medial rectus adjustable suture 3. Left inferior rectus 5.57mm central tenotomy 4. Left inferior r   Hypertension    on meds   Right knee osteoarthritis    Status post total right knee replacement 10/10/2020   Strabismus    due to eye injury as a child   Stroke (HCC) 07/2019   stroke with craniotomy   Past Surgical History:  Procedure Laterality Date   CESAREAN SECTION      x 2    CHOLECYSTECTOMY     COLONOSCOPY  04/22/2020   CORNEAL TRANSPLANT Left    2006 and 2016   CRANIOTOMY Left 08/09/2019   Procedure: CRANIOTOMY HEMATOMA EVACUATION SUBDURAL;  Surgeon: Coletta Memos, MD;  Location: [facility] OR;  Service: Neurosurgery;  Laterality: Left;  left   EYE SURGERY     TOTAL KNEE ARTHROPLASTY Right 10/10/2020   Procedure: RIGHT TOTAL KNEE ARTHROPLASTY;  Surgeon: Tarry Kos, MD;   Location: [facility] OR;  Service: Orthopedics;  Laterality: Right;   TUBAL LIGATION     Patient Active Problem List   Diagnosis Date Noted   Lumbar radiculopathy 08/03/2022   Chronic midline low back pain without sciatica 08/03/2022   Prediabetes 07/25/2021   Postmenopausal bleeding 06/24/2021   Vitamin D deficiency 04/24/2020   Postprandial RUQ pain 01/14/2020   Hyperlipemia 08/23/2019   ICH (intracerebral hemorrhage) (HCC) L parietal w/ L SDH s/p crani 08/09/2019   Onychomycosis 04/03/2019   Moderate, recurrent MDD 06/06/2018   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 06/06/2018   Primary osteoarthritis of right knee 06/05/2018   Essential hypertension 06/05/2018   Anxiety 06/05/2018   Age-related nuclear cataract of both eyes 11/26/2014   Diplopia 11/26/2014   Monocular esotropia of left eye 11/26/2014    PCP:  Ngetich, Donalee Citrin, NP   REFERRING PROVIDER:  Tarry Kos, MD   REFERRING DIAG: M54.50,G89.29 (ICD-10-CM) - Chronic midline low back pain without sciatica M54.16 (ICD-10-CM) - Lumbar radiculopathy  Rationale for Evaluation and Treatment: Rehabilitation  THERAPY DIAG:  Difficulty in walking, not elsewhere classified  Other low back pain  ONSET DATE: 5-6 months  SUBJECTIVE:  SUBJECTIVE STATEMENT: Patient arriving today reporting history of chronic low back pain that is worse with activities such as mopping the floors and completing household chores. Reports numbness and tingling in the right leg with prolonged standing. Pt reporting new fall yesterday.    PERTINENT HISTORY:  Anxiety, arthritis cataract, depression, HTN, Rt knee OA, Rt TKA 2022, Stroke, vit D deficiency, corneal transplant 2006 and 2016 on left  PAIN:  Are you having pain? Yes: NPRS scale:  4/10 Pain location: low back Pain description: achy,  Aggravating factors: standing, movement in the bed Relieving factors: pain meds  PRECAUTIONS: None  WEIGHT BEARING RESTRICTIONS: No  FALLS:  Has patient fallen in last 6 months? Yes, 1 on 08/30/22, no injury reported  LIVING ENVIRONMENT: Lives with: lives with their family Lives in: House/apartment Stairs: Yes: External: 3 steps; none Has following equipment at home: None  OCCUPATION:  PLOF: Independent  PATIENT GOALS: Feel better  NEXT MD VISIT: no f/u scheduled at present time with Dr. Roda Shutters  OBJECTIVE:   DIAGNOSTIC FINDINGS:  08/03/22  X-rays of the lumbar spine show degenerative scoliosis.  Diffuse  degenerative changes within the facet joints and the intervertebral  spaces.   PATIENT SURVEYS:  08/31/22: FOTO 51%   SCREENING FOR RED FLAGS: Bowel or bladder incontinence: No Cauda equina syndrome: No  COGNITION: Overall cognitive status: WFL normal      SENSATION: WFL   POSTURE: rounded shoulders and forward head  PALPATION: 08/31/22:   LUMBAR ROM:   AROM 08/31/22  Flexion 90 finger tips to floor  Extension 5  Right lateral flexion 12  Left lateral flexion 15  Right rotation Limited 75%  Left rotation Limited 75%   (Blank rows = not tested)  LOWER EXTREMITY ROM:       Right 08/31/22 Left 08/31/22  Hip flexion 90 95  Hip extension    Hip abduction    Hip adduction    Hip internal rotation    Hip external rotation    Knee flexion    Knee extension                     (Blank rows = not tested)  LOWER EXTREMITY MMT:    MMT Right 08/31/22 Left 08/31/22  Hip flexion 3 3  Hip extension    Hip abduction 4 4  Hip adduction 4 4  Hip internal rotation    Hip external rotation    Knee flexion 4+ 4  Knee extension 5 5                   (Blank rows = not tested)  LUMBAR SPECIAL TESTS:  08/31/22: Slump test: Negative bilateral  FUNCTIONAL TESTS:  08/31/22:  not tested at  eval  GAIT: 08/31/22: Distance walked: 20 feet  Assistive device utilized: None Level of assistance: Complete Independence Comments: wide BOS, decreased trunk dissociation  TODAY'S TREATMENT:  DATE: 08/31/22:  Therex:    HEP instruction/performance c cues for techniques, handout provided.  Trial set performed of each for comprehension and symptom assessment.  See below for exercise list  PATIENT EDUCATION:  Education details: HEP, POC Person educated: Patient Education method: Explanation, Demonstration, Verbal cues, and Handouts Education comprehension: verbalized understanding, returned demonstration, and verbal cues required  HOME EXERCISE PROGRAM: Access Code: [email or web address] URL: [email or web address] Date: 08/31/2022 Prepared by: Narda Amber  Exercises - Supine Bridge  - 2 x daily - 7 x weekly - 15 reps - 5 seconds hold - Supine Lower Trunk Rotation  - 2 x daily - 7 x weekly - 5 reps - 20 seconds hold - Hooklying Single Knee to Chest Stretch  - 2 x daily - 7 x weekly - 3 sets - 5 reps - 20 seconds hold  ASSESSMENT:  CLINICAL IMPRESSION: Patient is a 72 y.o. who comes to clinic with complaints of low back pain with mobility, strength and movement coordination deficits that impair their ability to perform usual daily and recreational functional activities without increase difficulty/symptoms at this time. Pt with bilateral LE weakness and limitations in her trunk ROM. Pt also presenting with bilateral LE edema.   Patient to benefit from skilled PT services to address impairments and limitations to improve to previous level of function without restriction secondary to condition.   OBJECTIVE IMPAIRMENTS: decreased activity tolerance, decreased mobility, difficulty walking, decreased ROM, decreased strength, impaired flexibility, and pain.    ACTIVITY LIMITATIONS: bending, sitting, standing, and sleeping  PARTICIPATION LIMITATIONS: cleaning, laundry, and community activity  PERSONAL FACTORS: 3+ comorbidities: see pertinent history above  are also affecting patient's functional outcome.   REHAB POTENTIAL: Good  CLINICAL DECISION MAKING: Stable/uncomplicated  EVALUATION COMPLEXITY: Low   GOALS: Goals reviewed with patient? Yes  SHORT TERM GOALS: (target date for Short term goals are 3 weeks 09/24/22)  1. Patient will demonstrate independent use of home exercise program to maintain progress from in clinic treatments.  Goal status: New  LONG TERM GOALS: (target dates for all long term goals are 10 weeks  11/12/22 )   1. Patient will demonstrate/report pain at worst less than or equal to 2/10 to facilitate minimal limitation in daily activity secondary to pain symptoms.  Goal status: New   2. Patient will demonstrate independent use of home exercise program to facilitate ability to maintain/progress functional gains from skilled physical therapy services.  Goal status: New   3. Patient will demonstrate FOTO outcome > or = 59 % to indicate reduced disability due to condition.  Goal status: New   4. Patient will demonstrate lumbar extension 100 % WFL s symptoms to facilitate upright standing, walking posture at PLOF s limitation.  Goal status: New   5.   Pt will be able to report 50% improvement in sleeping.  Goal status: New   6.  Pt will be able to climb a 5 stairs step over step with single hand rail safely with no pain reported.   Goal status: New     PLAN:  PT FREQUENCY: 1-2x/week  PT DURATION: 10 weeks  PLANNED INTERVENTIONS: Therapeutic exercises, Therapeutic activity, Neuro Muscular re-education, Balance training, Gait training, Patient/Family education, Joint mobilization, Stair training, DME instructions, Dry Needling, Electrical stimulation, Cryotherapy, vasopneumatic device, Moist heat,  Taping, Traction Ultrasound, Ionotophoresis 4mg /ml Dexamethasone, and aquatic therapy, Manual therapy.  All included unless contraindicated  PLAN FOR NEXT SESSION: Review HEP knowledge/results, core strengthening, LE strengthening, Perform TUG or BERG for balance assessment  due to recent fall on 08/30/22          Sharmon Leyden, PT, MPT 08/31/2022, 3:35 PM

## 2022-09-14 ENCOUNTER — Telehealth: Payer: Self-pay | Admitting: Physical Therapy

## 2022-09-14 ENCOUNTER — Encounter: Payer: Medicare Other | Admitting: Physical Therapy

## 2022-09-14 NOTE — Telephone Encounter (Signed)
I called pt to follow up after she missed her 11:00 PT appointment. I reminded pt of her 1:45 pm appointment tomorrow 09/15/22.  Narda Amber, PT, MPT 09/14/22 11:44 AM

## 2022-09-15 ENCOUNTER — Encounter: Payer: Self-pay | Admitting: Rehabilitative and Restorative Service Providers"

## 2022-09-15 ENCOUNTER — Ambulatory Visit: Payer: Medicare Other | Admitting: Rehabilitative and Restorative Service Providers"

## 2022-09-15 DIAGNOSIS — R262 Difficulty in walking, not elsewhere classified: Secondary | ICD-10-CM

## 2022-09-15 DIAGNOSIS — M5459 Other low back pain: Secondary | ICD-10-CM

## 2022-09-15 NOTE — Therapy (Signed)
OUTPATIENT PHYSICAL THERAPY THORACOLUMBAR EVALUATION   Patient Name: Michelle Carrillo MRN: [id number] DOB:21-Oct-1950, 72 y.o., female Today's Date: 09/15/2022  END OF SESSION:  PT End of Session - 09/15/22 1340     Visit Number 2    Number of Visits 20    Date for PT Re-Evaluation 11/12/22    Authorization Type [employer] Medicare $20 copay    Progress Note Due on Visit 10    PT Start Time 1343    PT Stop Time 1424    PT Time Calculation (min) 41 min    Activity Tolerance Patient tolerated treatment well    Behavior During Therapy [facility] for tasks assessed/performed              Past Medical History:  Diagnosis Date   Anxiety    on meds   Arthritis    bilateral knees   Cataract    RIGHT eye   Depression    on meds   Healthcare maintenance 06/20/2019   Herpes zoster kerstitis s/p corneal transplant 2015    History of strabismus surgery 03/31/2019   03/31/2019 1. Left medial rectus recession 6.2mm to11.68mm from limbus 2. Left medial rectus adjustable suture 3. Left inferior rectus 5.31mm central tenotomy 4. Left inferior rectus adjustable suture  Last Assessment & Plan:  03/31/2019 1. Left medial rectus recession 6.53mm to11.27mm from limbus 2. Left medial rectus adjustable suture 3. Left inferior rectus 5.73mm central tenotomy 4. Left inferior r   Hypertension    on meds   Right knee osteoarthritis    Status post total right knee replacement 10/10/2020   Strabismus    due to eye injury as a child   Stroke (HCC) 07/2019   stroke with craniotomy   Past Surgical History:  Procedure Laterality Date   CESAREAN SECTION      x 2    CHOLECYSTECTOMY     COLONOSCOPY  04/22/2020   CORNEAL TRANSPLANT Left    2006 and 2016   CRANIOTOMY Left 08/09/2019   Procedure: CRANIOTOMY HEMATOMA EVACUATION SUBDURAL;  Surgeon: Coletta Memos, MD;  Location: [facility] OR;  Service: Neurosurgery;  Laterality: Left;  left   EYE SURGERY     TOTAL KNEE ARTHROPLASTY Right 10/10/2020   Procedure: RIGHT TOTAL  KNEE ARTHROPLASTY;  Surgeon: Tarry Kos, MD;  Location: [facility] OR;  Service: Orthopedics;  Laterality: Right;   TUBAL LIGATION     Patient Active Problem List   Diagnosis Date Noted   Lumbar radiculopathy 08/03/2022   Chronic midline low back pain without sciatica 08/03/2022   Prediabetes 07/25/2021   Postmenopausal bleeding 06/24/2021   Vitamin D deficiency 04/24/2020   Postprandial RUQ pain 01/14/2020   Hyperlipemia 08/23/2019   ICH (intracerebral hemorrhage) (HCC) L parietal w/ L SDH s/p crani 08/09/2019   Onychomycosis 04/03/2019   Moderate, recurrent MDD 06/06/2018   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 06/06/2018   Primary osteoarthritis of right knee 06/05/2018   Essential hypertension 06/05/2018   Anxiety 06/05/2018   Age-related nuclear cataract of both eyes 11/26/2014   Diplopia 11/26/2014   Monocular esotropia of left eye 11/26/2014    PCP:  Ngetich, Donalee Citrin, NP   REFERRING PROVIDER:  Tarry Kos, MD   REFERRING DIAG: M54.50,G89.29 (ICD-10-CM) - Chronic midline low back pain without sciatica M54.16 (ICD-10-CM) - Lumbar radiculopathy  Rationale for Evaluation and Treatment: Rehabilitation  THERAPY DIAG:  Other low back pain  Difficulty in walking, not elsewhere classified  ONSET DATE:  5-6 months  SUBJECTIVE:                                                                                                                                                                                           SUBJECTIVE STATEMENT: She indicated feeling a little bit better since fist visit.  She reported having some pain with walking into today in Rt knee.   She reported today was some more pain with mopping floor (back pain).    Communication through interpreter in person.   PERTINENT HISTORY:  Anxiety, arthritis cataract, depression, HTN, Rt knee OA, Rt TKA 2022, Stroke, vit D deficiency, corneal transplant 2006 and 2016 on left  PAIN:  NPRS  scale: 5/10 Pain location: low back Pain description: achy,  Aggravating factors: housework, mopping Relieving factors: pain meds  PRECAUTIONS: None  WEIGHT BEARING RESTRICTIONS: No  FALLS:  Has patient fallen in last 6 months? Yes, 1 on 08/30/22, no injury reported  LIVING ENVIRONMENT: Lives with: lives with their family Lives in: House/apartment Stairs: Yes: External: 3 steps; none Has following equipment at home: None  OCCUPATION:  PLOF: Independent  PATIENT GOALS: Feel better  NEXT MD VISIT: no f/u scheduled at present time with Dr. Roda Shutters  OBJECTIVE:   DIAGNOSTIC FINDINGS:  08/03/22  X-rays of the lumbar spine show degenerative scoliosis.  Diffuse  degenerative changes within the facet joints and the intervertebral  spaces.   PATIENT SURVEYS:  08/31/22: FOTO 51%   SCREENING FOR RED FLAGS: 08/31/22 Bowel or bladder incontinence: No Cauda equina syndrome: No  COGNITION: 08/31/22 Overall cognitive status: WFL normal      SENSATION: 08/31/22 WFL   POSTURE:  08/31/22 rounded shoulders and forward head  PALPATION: 08/31/22:   LUMBAR ROM:   AROM 08/31/22   Flexion 90 finger tips to floor   Extension 5   Right lateral flexion 12   Left lateral flexion 15   Right rotation Limited 75%   Left rotation Limited 75%    (Blank rows = not tested)  LOWER EXTREMITY ROM:       Right 08/31/22 Left 08/31/22  Hip flexion 90 95  Hip extension    Hip abduction    Hip adduction    Hip internal rotation    Hip external rotation    Knee flexion    Knee extension                     (Blank rows = not tested)  LOWER EXTREMITY MMT:    MMT Right 08/31/22 Left 08/31/22  Hip flexion 3 3  Hip extension    Hip abduction 4  4  Hip adduction 4 4  Hip internal rotation    Hip external rotation    Knee flexion 4+ 4  Knee extension 5 5                   (Blank rows = not tested)  LUMBAR SPECIAL TESTS:  08/31/22: Slump test: Negative bilateral  FUNCTIONAL TESTS:   09/15/2022:  TUG independent:  18 seconds, 18.5 seconds  08/31/22:  not tested at eval  GAIT: 08/31/22: Distance walked: 20 feet  Assistive device utilized: None Level of assistance: Complete Independence Comments: wide BOS, decreased trunk dissociation  TODAY'S TREATMENT:                                                                                                                              DATE:  09/15/2022:  Therex: Review of existing HEP.   Additional time required for technique education on HEP and in clinic activity to improve comprehension.  Supine lumbar trunk rotation 15 sec x 3 bilateral Supine bridge 5 sec hold x 15 Hooklying SKC 15 sec x 3 bilateral Supine green band clam shell with isometric hold contralateral leg x 20 performed bilaterally Sit to stand to sit 18 inch mat no UE assist x 20 with slow sitting focus.  Nustep Lvl 6 6 mins UE/LE for mobility/aerobic exercise.    TODAY'S TREATMENT:                                                                                                                              DATE: 08/31/22:  Therex:    HEP instruction/performance c cues for techniques, handout provided.  Trial set performed of each for comprehension and symptom assessment.  See below for exercise list  PATIENT EDUCATION:  Education details: HEP, POC Person educated: Patient Education method: Explanation, Demonstration, Verbal cues, and Handouts Education comprehension: verbalized understanding, returned demonstration, and verbal cues required  HOME EXERCISE PROGRAM: Access Code: [email or web address] URL: [email or web address] Date: 08/31/2022 Prepared by: Narda Amber  Exercises - Supine Bridge  - 2 x daily - 7 x weekly - 15 reps - 5 seconds hold - Supine Lower Trunk Rotation  - 2 x daily - 7 x weekly - 5 reps - 20 seconds hold - Hooklying Single Knee to Chest Stretch  - 2 x daily - 7 x weekly - 3 sets - 5 reps - 20 seconds  hold  ASSESSMENT:  CLINICAL IMPRESSION: Fair recall of HEP at best.  No additions today to help improve ability to comprehend existing HEP and improve performance.   Continued skilled PT services indicated to progress lumbar mobility and knowledge of HEP techniques to improve symptom management and mobility in HEP and daily activity.   OBJECTIVE IMPAIRMENTS: decreased activity tolerance, decreased mobility, difficulty walking, decreased ROM, decreased strength, impaired flexibility, and pain.   ACTIVITY LIMITATIONS: bending, sitting, standing, and sleeping  PARTICIPATION LIMITATIONS: cleaning, laundry, and community activity  PERSONAL FACTORS: 3+ comorbidities: see pertinent history above  are also affecting patient's functional outcome.   REHAB POTENTIAL: Good  CLINICAL DECISION MAKING: Stable/uncomplicated  EVALUATION COMPLEXITY: Low   GOALS: Goals reviewed with patient? Yes  SHORT TERM GOALS: (target date for Short term goals are 3 weeks 09/24/22)  1. Patient will demonstrate independent use of home exercise program to maintain progress from in clinic treatments.  Goal status: on going 09/15/2022  LONG TERM GOALS: (target dates for all long term goals are 10 weeks  11/12/22 )   1. Patient will demonstrate/report pain at worst less than or equal to 2/10 to facilitate minimal limitation in daily activity secondary to pain symptoms.  Goal status: New   2. Patient will demonstrate independent use of home exercise program to facilitate ability to maintain/progress functional gains from skilled physical therapy services.  Goal status: New   3. Patient will demonstrate FOTO outcome > or = 59 % to indicate reduced disability due to condition.  Goal status: New   4. Patient will demonstrate lumbar extension 100 % WFL s symptoms to facilitate upright standing, walking posture at PLOF s limitation.  Goal status: New   5.   Pt will be able to report 50% improvement in  sleeping.  Goal status: New   6.  Pt will be able to climb a 5 stairs step over step with single hand rail safely with no pain reported.   Goal status: New     PLAN:  PT FREQUENCY: 1-2x/week  PT DURATION: 10 weeks  PLANNED INTERVENTIONS: Therapeutic exercises, Therapeutic activity, Neuro Muscular re-education, Balance training, Gait training, Patient/Family education, Joint mobilization, Stair training, DME instructions, Dry Needling, Electrical stimulation, Cryotherapy, vasopneumatic device, Moist heat, Taping, Traction Ultrasound, Ionotophoresis 4mg /ml Dexamethasone, and aquatic therapy, Manual therapy.  All included unless contraindicated  PLAN FOR NEXT SESSION:   STG yes/no assessment by 09/24/2022.  Review HEP, add a few items as necessary/able.    Chyrel Masson, PT, DPT, OCS, ATC 09/15/22  2:19 PM

## 2022-09-21 ENCOUNTER — Encounter: Payer: Self-pay | Admitting: Physical Therapy

## 2022-09-21 ENCOUNTER — Ambulatory Visit: Payer: Medicare Other | Admitting: Physical Therapy

## 2022-09-21 DIAGNOSIS — M6281 Muscle weakness (generalized): Secondary | ICD-10-CM

## 2022-09-21 DIAGNOSIS — R262 Difficulty in walking, not elsewhere classified: Secondary | ICD-10-CM | POA: Diagnosis not present

## 2022-09-21 DIAGNOSIS — M5459 Other low back pain: Secondary | ICD-10-CM

## 2022-09-21 DIAGNOSIS — R600 Localized edema: Secondary | ICD-10-CM | POA: Diagnosis not present

## 2022-09-21 NOTE — Therapy (Signed)
OUTPATIENT PHYSICAL THERAPY THORACOLUMBAR    Patient Name: Michelle Carrillo MRN: [id number] DOB:1950/10/21, 72 y.o., female Today's Date: 09/21/2022  END OF SESSION:  PT End of Session - 09/21/22 1131     Visit Number 3    Number of Visits 20    Date for PT Re-Evaluation 11/12/22    Authorization Type [employer] Medicare $20 copay    PT Start Time 1100    PT Stop Time 1138    PT Time Calculation (min) 38 min    Activity Tolerance Patient tolerated treatment well    Behavior During Therapy WFL for tasks assessed/performed               Past Medical History:  Diagnosis Date   Anxiety    on meds   Arthritis    bilateral knees   Cataract    RIGHT eye   Depression    on meds   Healthcare maintenance 06/20/2019   Herpes zoster kerstitis s/p corneal transplant 2015    History of strabismus surgery 03/31/2019   03/31/2019 1. Left medial rectus recession 6.42mm to11.32mm from limbus 2. Left medial rectus adjustable suture 3. Left inferior rectus 5.51mm central tenotomy 4. Left inferior rectus adjustable suture  Last Assessment & Plan:  03/31/2019 1. Left medial rectus recession 6.96mm to11.4mm from limbus 2. Left medial rectus adjustable suture 3. Left inferior rectus 5.62mm central tenotomy 4. Left inferior r   Hypertension    on meds   Right knee osteoarthritis    Status post total right knee replacement 10/10/2020   Strabismus    due to eye injury as a child   Stroke (HCC) 07/2019   stroke with craniotomy   Past Surgical History:  Procedure Laterality Date   CESAREAN SECTION      x 2    CHOLECYSTECTOMY     COLONOSCOPY  04/22/2020   CORNEAL TRANSPLANT Left    2006 and 2016   CRANIOTOMY Left 08/09/2019   Procedure: CRANIOTOMY HEMATOMA EVACUATION SUBDURAL;  Surgeon: Coletta Memos, MD;  Location: [facility] OR;  Service: Neurosurgery;  Laterality: Left;  left   EYE SURGERY     TOTAL KNEE ARTHROPLASTY Right 10/10/2020   Procedure: RIGHT TOTAL KNEE ARTHROPLASTY;  Surgeon: Tarry Kos, MD;  Location: [facility] OR;  Service: Orthopedics;  Laterality: Right;   TUBAL LIGATION     Patient Active Problem List   Diagnosis Date Noted   Lumbar radiculopathy 08/03/2022   Chronic midline low back pain without sciatica 08/03/2022   Prediabetes 07/25/2021   Postmenopausal bleeding 06/24/2021   Vitamin D deficiency 04/24/2020   Postprandial RUQ pain 01/14/2020   Hyperlipemia 08/23/2019   ICH (intracerebral hemorrhage) (HCC) L parietal w/ L SDH s/p crani 08/09/2019   Onychomycosis 04/03/2019   Moderate, recurrent MDD 06/06/2018   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 06/06/2018   Primary osteoarthritis of right knee 06/05/2018   Essential hypertension 06/05/2018   Anxiety 06/05/2018   Age-related nuclear cataract of both eyes 11/26/2014   Diplopia 11/26/2014   Monocular esotropia of left eye 11/26/2014    PCP:  Ngetich, Donalee Citrin, NP   REFERRING PROVIDER:  Tarry Kos, MD   REFERRING DIAG: M54.50,G89.29 (ICD-10-CM) - Chronic midline low back pain without sciatica M54.16 (ICD-10-CM) - Lumbar radiculopathy  Rationale for Evaluation and Treatment: Rehabilitation  THERAPY DIAG:  Other low back pain  Difficulty in walking, not elsewhere classified  Muscle weakness (generalized)  Localized edema  ONSET DATE: 5-6  months  SUBJECTIVE:                                                                                                                                                                                           SUBJECTIVE STATEMENT: Pt arriving today reporting 4/10 pain in her low back. Pt's family reporting she hasn't done her exercises much.   PERTINENT HISTORY:  Anxiety, arthritis cataract, depression, HTN, Rt knee OA, Rt TKA 2022, Stroke, vit D deficiency, corneal transplant 2006 and 2016 on left  PAIN:  NPRS scale: 4/10 Pain location: low back Pain description: achy,  Aggravating factors: housework, mopping Relieving factors:  pain meds  PRECAUTIONS: None  WEIGHT BEARING RESTRICTIONS: No  FALLS:  Has patient fallen in last 6 months? Yes, 1 on 08/30/22, no injury reported  LIVING ENVIRONMENT: Lives with: lives with their family Lives in: House/apartment Stairs: Yes: External: 3 steps; none Has following equipment at home: None  OCCUPATION:  PLOF: Independent  PATIENT GOALS: Feel better  NEXT MD VISIT: no f/u scheduled at present time with Dr. Roda Shutters  OBJECTIVE:   DIAGNOSTIC FINDINGS:  08/03/22  X-rays of the lumbar spine show degenerative scoliosis.  Diffuse  degenerative changes within the facet joints and the intervertebral  spaces.   PATIENT SURVEYS:  08/31/22: FOTO 51%   SCREENING FOR RED FLAGS: 08/31/22 Bowel or bladder incontinence: No Cauda equina syndrome: No  COGNITION: 08/31/22 Overall cognitive status: WFL normal      SENSATION: 08/31/22 WFL   POSTURE:  08/31/22 rounded shoulders and forward head  PALPATION: 08/31/22:   LUMBAR ROM:   AROM 08/31/22 09/21/22  Flexion 90 finger tips to floor   Extension 5 10  Right lateral flexion 12 18  Left lateral flexion 15 20  Right rotation Limited 75% Limited 50%  Left rotation Limited 75% Limited 50%   (Blank rows = not tested)  LOWER EXTREMITY ROM:       Right 08/31/22 Left 08/31/22  Hip flexion 90 95  Hip extension    Hip abduction    Hip adduction    Hip internal rotation    Hip external rotation    Knee flexion    Knee extension                     (Blank rows = not tested)  LOWER EXTREMITY MMT:    MMT Right 08/31/22 Left 08/31/22  Hip flexion 3 3  Hip extension    Hip abduction 4 4  Hip adduction 4 4  Hip internal rotation    Hip external rotation    Knee flexion 4+ 4  Knee  extension 5 5                   (Blank rows = not tested)  LUMBAR SPECIAL TESTS:  08/31/22: Slump test: Negative bilateral  FUNCTIONAL TESTS:  09/15/2022:  TUG independent:  18 seconds, 18.5 seconds  08/31/22:  not tested at  eval  GAIT: 08/31/22: Distance walked: 20 feet  Assistive device utilized: None Level of assistance: Complete Independence Comments: wide BOS, decreased trunk dissociation  _________________________________________________________________________________________-  TODAY'S TREATMENT:                                                                                                                         DATE:  09/21/2022:  Therex: Nustep: level 5 x 7 minutes seat 6 UE/LE  Supine lumbar trunk rotation 15 sec x 3 bilateral Supine bridge 5 sec hold x 15 Supine hip adduction: ball squeezes 2 x 10 holding 5 sec SLR: x 10 bil LE's Supine clam shells 2 x 10 c green Hooklying SKC 15 sec x 3 bilateral Bridge:  x 10 holding 5 sec Sit to stand to sit 18 inch mat no UE assist x 20 with slow sitting focus.  Seated bil hamstring stretch: x 3 holding 30 seconds      TODAY'S TREATMENT:                                                                                                                              DATE:  09/15/2022:  Therex: Review of existing HEP.   Additional time required for technique education on HEP and in clinic activity to improve comprehension.  Supine lumbar trunk rotation 15 sec x 3 bilateral Supine bridge 5 sec hold x 15 Hooklying SKC 15 sec x 3 bilateral Supine green band clam shell with isometric hold contralateral leg x 20 performed bilaterally Sit to stand to sit 18 inch mat no UE assist x 20 with slow sitting focus.  Nustep Lvl 6 6 mins UE/LE for mobility/aerobic exercise.    TODAY'S TREATMENT:  DATE: 08/31/22:  Therex:    HEP instruction/performance c cues for techniques, handout provided.  Trial set performed of each for comprehension and symptom assessment.  See below for exercise list  PATIENT EDUCATION:  Education details: HEP, POC Person  educated: Patient Education method: Explanation, Demonstration, Verbal cues, and Handouts Education comprehension: verbalized understanding, returned demonstration, and verbal cues required  HOME EXERCISE PROGRAM: Access Code: [email or web address] URL: [email or web address] Date: 09/21/2022 Prepared by: Narda Amber  Exercises - Supine Bridge  - 2 x daily - 7 x weekly - 15 reps - 5 seconds hold - Supine Lower Trunk Rotation  - 2 x daily - 7 x weekly - 5 reps - 20 seconds hold - Hooklying Single Knee to Chest Stretch  - 2 x daily - 7 x weekly - 5 reps - 20 seconds hold - Hooklying Clamshell with Resistance  - 2 x daily - 7 x weekly - 10 reps - Sit to Stand with Counter Support  - 2 x daily - 7 x weekly - 10 reps - Supine Piriformis Stretch with Foot on Ground  - 2 x daily - 7 x weekly - 5 reps - 20 seconds hold  ASSESSMENT:  CLINICAL IMPRESSION: Pt's HEP was reviewed and updated this visit. Pt requiring additional time for instruction of the new exercises.  Pt has met her STG set at initial evaluation. Pt tolerating all exercises well today with no reports of increased pain. Continue with skilled PT to maximize pt's function toward LTG's set.   OBJECTIVE IMPAIRMENTS: decreased activity tolerance, decreased mobility, difficulty walking, decreased ROM, decreased strength, impaired flexibility, and pain.   ACTIVITY LIMITATIONS: bending, sitting, standing, and sleeping  PARTICIPATION LIMITATIONS: cleaning, laundry, and community activity  PERSONAL FACTORS: 3+ comorbidities: see pertinent history above  are also affecting patient's functional outcome.   REHAB POTENTIAL: Good  CLINICAL DECISION MAKING: Stable/uncomplicated  EVALUATION COMPLEXITY: Low   GOALS: Goals reviewed with patient? Yes  SHORT TERM GOALS: (target date for Short term goals are 3 weeks 09/24/22)  1. Patient will demonstrate independent use of home exercise program to maintain progress from in clinic  treatments.  Goal status: MET 09/21/22  LONG TERM GOALS: (target dates for all long term goals are 10 weeks  11/12/22 )   1. Patient will demonstrate/report pain at worst less than or equal to 2/10 to facilitate minimal limitation in daily activity secondary to pain symptoms.  Goal status: on-going 09/21/22   2. Patient will demonstrate independent use of home exercise program to facilitate ability to maintain/progress functional gains from skilled physical therapy services.  Goal status: New   3. Patient will demonstrate FOTO outcome > or = 59 % to indicate reduced disability due to condition.  Goal status: New   4. Patient will demonstrate lumbar extension 100 % WFL s symptoms to facilitate upright standing, walking posture at PLOF s limitation.  Goal status: New   5.   Pt will be able to report 50% improvement in sleeping.  Goal status: New   6.  Pt will be able to climb 5 stairs step over step with single hand rail safely with no pain reported.   Goal status: New     PLAN:  PT FREQUENCY: 1-2x/week  PT DURATION: 10 weeks  PLANNED INTERVENTIONS: Therapeutic exercises, Therapeutic activity, Neuro Muscular re-education, Balance training, Gait training, Patient/Family education, Joint mobilization, Stair training, DME instructions, Dry Needling, Electrical stimulation, Cryotherapy, vasopneumatic device, Moist heat, Taping, Traction Ultrasound, Ionotophoresis 4mg /ml Dexamethasone, and aquatic therapy, Manual  therapy.  All included unless contraindicated  PLAN FOR NEXT SESSION: Core strengthening, LE strengthening, continue to update HEP as needed   Narda Amber, PT, MPT 09/21/22 11:44 AM   09/21/22  11:44 AM

## 2022-09-23 ENCOUNTER — Encounter: Payer: Self-pay | Admitting: Physical Therapy

## 2022-09-23 ENCOUNTER — Ambulatory Visit: Payer: Medicare Other | Admitting: Physical Therapy

## 2022-09-23 DIAGNOSIS — M5459 Other low back pain: Secondary | ICD-10-CM | POA: Diagnosis not present

## 2022-09-23 DIAGNOSIS — R262 Difficulty in walking, not elsewhere classified: Secondary | ICD-10-CM | POA: Diagnosis not present

## 2022-09-23 NOTE — Therapy (Addendum)
OUTPATIENT PHYSICAL THERAPY TREATMENT /DISCHARGE    Patient Name: Michelle Carrillo MRN: [id number] DOB:06/13/1950, 72 y.o., female Today's Date: 09/23/2022  END OF SESSION:  PT End of Session - 09/23/22 1015     Visit Number 4    Number of Visits 20    Date for PT Re-Evaluation 11/12/22    Authorization Type [employer] $20 copay    Progress Note Due on Visit 10    PT Start Time 1012    PT Stop Time 1052    PT Time Calculation (min) 40 min    Activity Tolerance Patient tolerated treatment well    Behavior During Therapy [facility] for tasks assessed/performed                Past Medical History:  Diagnosis Date   Anxiety    on meds   Arthritis    bilateral knees   Cataract    RIGHT eye   Depression    on meds   Healthcare maintenance 06/20/2019   Herpes zoster kerstitis s/p corneal transplant 2015    History of strabismus surgery 03/31/2019   03/31/2019 1. Left medial rectus recession 6.97mm to11.10mm from limbus 2. Left medial rectus adjustable suture 3. Left inferior rectus 5.68mm central tenotomy 4. Left inferior rectus adjustable suture  Last Assessment & Plan:  03/31/2019 1. Left medial rectus recession 6.49mm to11.46mm from limbus 2. Left medial rectus adjustable suture 3. Left inferior rectus 5.40mm central tenotomy 4. Left inferior r   Hypertension    on meds   Right knee osteoarthritis    Status post total right knee replacement 10/10/2020   Strabismus    due to eye injury as a child   Stroke (HCC) 07/2019   stroke with craniotomy   Past Surgical History:  Procedure Laterality Date   CESAREAN SECTION      x 2    CHOLECYSTECTOMY     COLONOSCOPY  04/22/2020   CORNEAL TRANSPLANT Left    2006 and 2016   CRANIOTOMY Left 08/09/2019   Procedure: CRANIOTOMY HEMATOMA EVACUATION SUBDURAL;  Surgeon: Coletta Memos, MD;  Location: [facility] OR;  Service: Neurosurgery;  Laterality: Left;  left   EYE SURGERY     TOTAL KNEE ARTHROPLASTY Right 10/10/2020   Procedure: RIGHT  TOTAL KNEE ARTHROPLASTY;  Surgeon: Tarry Kos, MD;  Location: [facility] OR;  Service: Orthopedics;  Laterality: Right;   TUBAL LIGATION     Patient Active Problem List   Diagnosis Date Noted   Lumbar radiculopathy 08/03/2022   Chronic midline low back pain without sciatica 08/03/2022   Prediabetes 07/25/2021   Postmenopausal bleeding 06/24/2021   Vitamin D deficiency 04/24/2020   Postprandial RUQ pain 01/14/2020   Hyperlipemia 08/23/2019   ICH (intracerebral hemorrhage) (HCC) L parietal w/ L SDH s/p crani 08/09/2019   Onychomycosis 04/03/2019   Moderate, recurrent MDD 06/06/2018   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 06/06/2018   Primary osteoarthritis of right knee 06/05/2018   Essential hypertension 06/05/2018   Anxiety 06/05/2018   Age-related nuclear cataract of both eyes 11/26/2014   Diplopia 11/26/2014   Monocular esotropia of left eye 11/26/2014    PCP:  Ngetich, Donalee Citrin, NP   REFERRING PROVIDER:  Tarry Kos, MD   REFERRING DIAG: M54.50,G89.29 (ICD-10-CM) - Chronic midline low back pain without sciatica M54.16 (ICD-10-CM) - Lumbar radiculopathy  Rationale for Evaluation and Treatment: Rehabilitation  THERAPY DIAG:  Other low back pain  Difficulty in walking, not elsewhere classified  ONSET DATE: 5-6 months  SUBJECTIVE:                                                                                                                                                                                           SUBJECTIVE STATEMENT: Some days are better and some days the pain isn't any different.  Was having more pain yesterday.  Is only doing her exercises "a little bit."  PERTINENT HISTORY:  Anxiety, arthritis cataract, depression, HTN, Rt knee OA, Rt TKA 2022, Stroke, vit D deficiency, corneal transplant 2006 and 2016 on left  PAIN:  NPRS scale: 4/10 Pain location: low back Pain description: achy,  Aggravating factors: housework,  mopping Relieving factors: pain meds  PRECAUTIONS: None  WEIGHT BEARING RESTRICTIONS: No  FALLS:  Has patient fallen in last 6 months? Yes, 1 on 08/30/22, no injury reported  LIVING ENVIRONMENT: Lives with: lives with their family Lives in: House/apartment Stairs: Yes: External: 3 steps; none Has following equipment at home: None  OCCUPATION:  PLOF: Independent  PATIENT GOALS: Feel better  NEXT MD VISIT: no f/u scheduled at present time with Dr. Roda Shutters  OBJECTIVE:   DIAGNOSTIC FINDINGS:  08/03/22  X-rays of the lumbar spine show degenerative scoliosis.  Diffuse  degenerative changes within the facet joints and the intervertebral  spaces.   PATIENT SURVEYS:  08/31/22: FOTO 51%   SCREENING FOR RED FLAGS: 08/31/22 Bowel or bladder incontinence: No Cauda equina syndrome: No  COGNITION: 08/31/22 Overall cognitive status: WFL normal      SENSATION: 08/31/22 WFL  POSTURE:  08/31/22 rounded shoulders and forward head  LUMBAR ROM:   AROM 08/31/22 09/21/22  Flexion 90 finger tips to floor   Extension 5 10  Right lateral flexion 12 18  Left lateral flexion 15 20  Right rotation Limited 75% Limited 50%  Left rotation Limited 75% Limited 50%   (Blank rows = not tested)  LOWER EXTREMITY ROM:       Right 08/31/22 Left 08/31/22  Hip flexion 90 95  Hip extension    Hip abduction    Hip adduction    Hip internal rotation    Hip external rotation    Knee flexion    Knee extension                     (Blank rows = not tested)  LOWER EXTREMITY MMT:    MMT Right 08/31/22 Left 08/31/22  Hip flexion 3 3  Hip extension    Hip abduction 4 4  Hip adduction 4 4  Hip internal rotation    Hip external rotation    Knee  flexion 4+ 4  Knee extension 5 5   (Blank rows = not tested)  LUMBAR SPECIAL TESTS:  08/31/22: Slump test: Negative bilateral  FUNCTIONAL TESTS:  09/15/2022:  TUG independent:  18 seconds, 18.5 seconds  08/31/22:  not tested at  eval  GAIT: 08/31/22: Distance walked: 20 feet  Assistive device utilized: None Level of assistance: Complete Independence Comments: wide BOS, decreased trunk dissociation  _________________________________________________________________________________________-  TODAY'S TREATMENT:                                                                                                                         DATE:  09/23/2022:  Therex: Nustep: level 8 x 7 minutes seat 5 UE/LE  Supine lumbar trunk rotation 15 sec x 3 bilateral Hooklying piriformis stretch with knee to opposite shoulder 15 sec x 3 bilateral Supine bridge 5 sec hold x 15 Hooklying single limb clamshell L3 band 2x10 bil Seated trunk lean with 6# med ball 2x10 Seated trunk rotation 2x10 bil with 6# med ball Sit to/from stand with overhead reach and 6# med ball 2x10 Standing hip abduction L3 band 2x10 bil Standing hip extension L3 band 2x10 bil Squats 2x10 Seated SLR 2x10 bil    TODAY'S TREATMENT:                                                                                                                         DATE:  09/21/2022:  Therex: Nustep: level 5 x 7 minutes seat 6 UE/LE  Supine lumbar trunk rotation 15 sec x 3 bilateral Supine bridge 5 sec hold x 15 Supine hip adduction: ball squeezes 2 x 10 holding 5 sec SLR: x 10 bil LE's Supine clam shells 2 x 10 c green Hooklying SKC 15 sec x 3 bilateral Bridge:  x 10 holding 5 sec Sit to stand to sit 18 inch mat no UE assist x 20 with slow sitting focus.  Seated bil hamstring stretch: x 3 holding 30 seconds      TODAY'S TREATMENT:  DATE:  09/15/2022:  Therex: Review of existing HEP.   Additional time required for technique education on HEP and in clinic activity to improve comprehension.  Supine lumbar trunk rotation 15 sec x 3 bilateral Supine  bridge 5 sec hold x 15 Hooklying SKC 15 sec x 3 bilateral Supine green band clam shell with isometric hold contralateral leg x 20 performed bilaterally Sit to stand to sit 18 inch mat no UE assist x 20 with slow sitting focus.  Nustep Lvl 6 6 mins UE/LE for mobility/aerobic exercise.    TODAY'S TREATMENT:                                                                                                                              DATE: 08/31/22:  Therex:    HEP instruction/performance c cues for techniques, handout provided.  Trial set performed of each for comprehension and symptom assessment.  See below for exercise list  PATIENT EDUCATION:  Education details: HEP, POC Person educated: Patient Education method: Explanation, Demonstration, Verbal cues, and Handouts Education comprehension: verbalized understanding, returned demonstration, and verbal cues required  HOME EXERCISE PROGRAM: Access Code: [email or web address] URL: [email or web address] Date: 09/21/2022 Prepared by: Narda Amber  Exercises - Supine Bridge  - 2 x daily - 7 x weekly - 15 reps - 5 seconds hold - Supine Lower Trunk Rotation  - 2 x daily - 7 x weekly - 5 reps - 20 seconds hold - Hooklying Single Knee to Chest Stretch  - 2 x daily - 7 x weekly - 5 reps - 20 seconds hold - Hooklying Clamshell with Resistance  - 2 x daily - 7 x weekly - 10 reps - Sit to Stand with Counter Support  - 2 x daily - 7 x weekly - 10 reps - Supine Piriformis Stretch with Foot on Ground  - 2 x daily - 7 x weekly - 5 reps - 20 seconds hold  ASSESSMENT:  CLINICAL IMPRESSION: Pt tolerated session well today and needs frequent cues with exercises.  No pain at the end of session today.  Continue skilled PT.   OBJECTIVE IMPAIRMENTS: decreased activity tolerance, decreased mobility, difficulty walking, decreased ROM, decreased strength, impaired flexibility, and pain.   ACTIVITY LIMITATIONS: bending, sitting, standing, and  sleeping  PARTICIPATION LIMITATIONS: cleaning, laundry, and community activity  PERSONAL FACTORS: 3+ comorbidities: see pertinent history above  are also affecting patient's functional outcome.   REHAB POTENTIAL: Good  CLINICAL DECISION MAKING: Stable/uncomplicated  EVALUATION COMPLEXITY: Low   GOALS: Goals reviewed with patient? Yes  SHORT TERM GOALS: (target date for Short term goals are 3 weeks 09/24/22)  1. Patient will demonstrate independent use of home exercise program to maintain progress from in clinic treatments.  Goal status: MET 09/21/22  LONG TERM GOALS: (target dates for all long term goals are 10 weeks  11/12/22 )   1. Patient will demonstrate/report pain at worst less than or equal to 2/10 to facilitate minimal limitation  in daily activity secondary to pain symptoms.  Goal status: on-going 09/21/22   2. Patient will demonstrate independent use of home exercise program to facilitate ability to maintain/progress functional gains from skilled physical therapy services.  Goal status: New   3. Patient will demonstrate FOTO outcome > or = 59 % to indicate reduced disability due to condition.  Goal status: New   4. Patient will demonstrate lumbar extension 100 % WFL s symptoms to facilitate upright standing, walking posture at PLOF s limitation.  Goal status: New   5.   Pt will be able to report 50% improvement in sleeping.  Goal status: New   6.  Pt will be able to climb 5 stairs step over step with single hand rail safely with no pain reported.   Goal status: New     PLAN:  PT FREQUENCY: 1-2x/week  PT DURATION: 10 weeks  PLANNED INTERVENTIONS: Therapeutic exercises, Therapeutic activity, Neuro Muscular re-education, Balance training, Gait training, Patient/Family education, Joint mobilization, Stair training, DME instructions, Dry Needling, Electrical stimulation, Cryotherapy, vasopneumatic device, Moist heat, Taping, Traction Ultrasound, Ionotophoresis  4mg /ml Dexamethasone, and aquatic therapy, Manual therapy.  All included unless contraindicated  PLAN FOR NEXT SESSION: keep exercises simple, Core strengthening, LE strengthening, continue to update HEP as needed   Clarita Crane, PT, DPT 09/23/22 10:56 AM   PHYSICAL THERAPY DISCHARGE SUMMARY  Visits from Start of Care: 4  Current functional level related to goals / functional outcomes: See note   Remaining deficits: See note   Education / Equipment: HEP  Patient goals were partially met. Patient is being discharged due to not returning since the last visit.  Chyrel Masson, PT, DPT, OCS, ATC 10/25/22  2:48 PM

## 2022-09-27 ENCOUNTER — Encounter: Payer: Medicare Other | Admitting: Physical Therapy

## 2022-09-28 ENCOUNTER — Encounter: Payer: Medicare Other | Admitting: Physical Therapy

## 2022-09-29 ENCOUNTER — Encounter: Payer: Medicare Other | Admitting: Physical Therapy

## 2022-10-05 ENCOUNTER — Encounter: Payer: Medicare Other | Admitting: Physical Therapy

## 2022-10-07 DIAGNOSIS — H2511 Age-related nuclear cataract, right eye: Secondary | ICD-10-CM | POA: Diagnosis not present

## 2022-10-07 DIAGNOSIS — Z947 Corneal transplant status: Secondary | ICD-10-CM | POA: Diagnosis not present

## 2022-10-07 DIAGNOSIS — H43391 Other vitreous opacities, right eye: Secondary | ICD-10-CM | POA: Diagnosis not present

## 2022-10-07 DIAGNOSIS — H35363 Drusen (degenerative) of macula, bilateral: Secondary | ICD-10-CM | POA: Diagnosis not present

## 2022-10-07 DIAGNOSIS — H524 Presbyopia: Secondary | ICD-10-CM | POA: Diagnosis not present

## 2022-10-12 ENCOUNTER — Encounter: Payer: Medicare Other | Admitting: Physical Therapy

## 2022-10-14 ENCOUNTER — Other Ambulatory Visit: Payer: Self-pay | Admitting: Family

## 2022-10-14 DIAGNOSIS — I10 Essential (primary) hypertension: Secondary | ICD-10-CM

## 2022-10-15 ENCOUNTER — Other Ambulatory Visit: Payer: Self-pay | Admitting: Family

## 2022-10-15 DIAGNOSIS — I10 Essential (primary) hypertension: Secondary | ICD-10-CM

## 2022-10-15 NOTE — Telephone Encounter (Signed)
Message routed to PCP Ngetich, Dinah C, NP  

## 2022-11-17 ENCOUNTER — Encounter: Payer: Medicare Other | Admitting: Obstetrics and Gynecology

## 2022-11-23 ENCOUNTER — Other Ambulatory Visit: Payer: Medicare Other

## 2022-11-25 ENCOUNTER — Ambulatory Visit: Payer: Medicare Other | Admitting: Family

## 2023-01-03 ENCOUNTER — Ambulatory Visit: Payer: Medicare Other | Admitting: Family

## 2023-07-02 ENCOUNTER — Other Ambulatory Visit: Payer: Self-pay | Admitting: Family

## 2023-07-02 DIAGNOSIS — I10 Essential (primary) hypertension: Secondary | ICD-10-CM

## 2023-09-05 ENCOUNTER — Other Ambulatory Visit: Payer: Self-pay | Admitting: Nurse Practitioner

## 2023-09-05 ENCOUNTER — Other Ambulatory Visit: Payer: Self-pay | Admitting: Family

## 2023-09-05 DIAGNOSIS — F419 Anxiety disorder, unspecified: Secondary | ICD-10-CM

## 2023-09-06 NOTE — Telephone Encounter (Signed)
 Pharmacy requested refill.  Patient needs an appointment before anymore future refills. MyChart message sent to patient.

## 2023-10-03 ENCOUNTER — Other Ambulatory Visit: Payer: Self-pay | Admitting: Family

## 2023-10-04 NOTE — Telephone Encounter (Signed)
 Needs appt

## 2023-10-04 NOTE — Telephone Encounter (Signed)
 Patient has request refill on medication Atorvastatin . Patient medication last refilled 09/06/2023. Patient was told to schedule appointment for further refills and has not done so. Medication pend and sent to Harlene An, NP for approval due to PCP Ngetich, Roxan BROCKS, NP being out of office.

## 2023-10-09 ENCOUNTER — Other Ambulatory Visit: Payer: Self-pay | Admitting: Family

## 2023-10-09 DIAGNOSIS — F32A Depression, unspecified: Secondary | ICD-10-CM

## 2023-11-09 ENCOUNTER — Other Ambulatory Visit: Payer: Self-pay

## 2023-11-09 MED ORDER — ATORVASTATIN CALCIUM 10 MG PO TABS
ORAL_TABLET | ORAL | 0 refills | Status: DC
Start: 2023-11-09 — End: 2023-12-27

## 2023-12-20 ENCOUNTER — Telehealth: Payer: Self-pay | Admitting: Podiatry

## 2023-12-20 NOTE — Telephone Encounter (Signed)
 Must make an appointment in a established patient slot.

## 2023-12-22 ENCOUNTER — Ambulatory Visit: Admitting: Podiatry

## 2023-12-22 ENCOUNTER — Ambulatory Visit: Admitting: Family

## 2023-12-23 ENCOUNTER — Ambulatory Visit: Admitting: Family

## 2023-12-26 ENCOUNTER — Ambulatory Visit: Admitting: Family

## 2023-12-26 ENCOUNTER — Ambulatory Visit: Admitting: Podiatry

## 2023-12-27 ENCOUNTER — Ambulatory Visit (INDEPENDENT_AMBULATORY_CARE_PROVIDER_SITE_OTHER): Admitting: Family

## 2023-12-27 VITALS — BP 132/84 | HR 64 | Temp 97.6°F | Resp 20 | Ht <= 58 in | Wt 174.2 lb

## 2023-12-27 DIAGNOSIS — Z23 Encounter for immunization: Secondary | ICD-10-CM

## 2023-12-27 DIAGNOSIS — M545 Low back pain, unspecified: Secondary | ICD-10-CM | POA: Diagnosis not present

## 2023-12-27 DIAGNOSIS — I611 Nontraumatic intracerebral hemorrhage in hemisphere, cortical: Secondary | ICD-10-CM

## 2023-12-27 DIAGNOSIS — Z1211 Encounter for screening for malignant neoplasm of colon: Secondary | ICD-10-CM | POA: Diagnosis not present

## 2023-12-27 DIAGNOSIS — E782 Mixed hyperlipidemia: Secondary | ICD-10-CM | POA: Diagnosis not present

## 2023-12-27 DIAGNOSIS — F419 Anxiety disorder, unspecified: Secondary | ICD-10-CM | POA: Diagnosis not present

## 2023-12-27 DIAGNOSIS — R413 Other amnesia: Secondary | ICD-10-CM | POA: Diagnosis not present

## 2023-12-27 DIAGNOSIS — Z1231 Encounter for screening mammogram for malignant neoplasm of breast: Secondary | ICD-10-CM

## 2023-12-27 DIAGNOSIS — G8929 Other chronic pain: Secondary | ICD-10-CM

## 2023-12-27 DIAGNOSIS — N898 Other specified noninflammatory disorders of vagina: Secondary | ICD-10-CM

## 2023-12-27 DIAGNOSIS — H938X3 Other specified disorders of ear, bilateral: Secondary | ICD-10-CM

## 2023-12-27 DIAGNOSIS — I10 Essential (primary) hypertension: Secondary | ICD-10-CM

## 2023-12-27 DIAGNOSIS — F32A Depression, unspecified: Secondary | ICD-10-CM

## 2023-12-27 MED ORDER — ASPIRIN EC 81 MG PO TBEC: 81.0000 mg | DELAYED_RELEASE_TABLET | Freq: Every day | ORAL | 2 refills | Status: AC

## 2023-12-27 MED ORDER — AMLODIPINE BESYLATE 10 MG PO TABS: 10.0000 mg | ORAL_TABLET | Freq: Every day | ORAL | 1 refills | Status: DC

## 2023-12-27 MED ORDER — ATORVASTATIN CALCIUM 10 MG PO TABS: ORAL_TABLET | ORAL | 3 refills | Status: DC

## 2023-12-27 MED ORDER — PAROXETINE HCL 20 MG PO TABS
20.0000 mg | ORAL_TABLET | Freq: Every day | ORAL | 0 refills | Status: DC
Start: 2023-12-27 — End: 2024-01-03

## 2023-12-27 MED ORDER — ACETAMINOPHEN 500 MG PO TABS: 500.0000 mg | ORAL_TABLET | Freq: Four times a day (QID) | ORAL | 3 refills | Status: AC | PRN

## 2023-12-27 MED ORDER — LISINOPRIL 20 MG PO TABS: 20.0000 mg | ORAL_TABLET | Freq: Every day | ORAL | 1 refills | Status: DC

## 2023-12-27 NOTE — Patient Instructions (Signed)
 1.Stop at check out and schedule Annual Wellness Visit.

## 2023-12-28 ENCOUNTER — Ambulatory Visit

## 2023-12-29 ENCOUNTER — Ambulatory Visit

## 2023-12-29 NOTE — Progress Notes (Signed)
 Provider: Roxan Plough FNP-C   [place] Marchiano, Roxan BROCKS, NP  Patient Care Team: Tadan Shill, Roxan BROCKS, NP as PCP - General (Family Medicine)  Extended Emergency Contact Information Primary Emergency Contact: Jabier Eda [place], [place] [address] [place] Phone: [phone] Relation: Daughter Secondary Emergency Contact: Jackson Dama Address: [address]          [place], [place] [address] [place] Home Phone: [phone] Mobile Phone: [phone] Relation: Spouse  Code Status:  Full Code  Goals of care: Advanced Directive information    12/27/2023   11:48 AM  Advanced Directives  Does Patient Have a Medical Advance Directive? No  Would patient like information on creating a medical advance directive? No - Patient declined     Chief Complaint  Patient presents with   Annual Exam    Physical      Discussed the use of AI scribe software for clinical note transcription with the patient, who gave verbal consent to proceed.  History of Present Illness   Michelle Carrillo is a 73 year old female who presents with memory issues. She is accompanied by her daughter, who is also an interpreter though [employer] Interpreter also present.  She is experiencing significant memory issues, with difficulties in remembering how to use household appliances like the microwave and blender, indicating a decline in her ability to retain new information. Her daughter is concerned about potential dementia or Alzheimer's disease.  She has persistent lower back pain located in the lower back area, which has been bothersome enough to consider the need for a steroid injection.  She has a long-standing issue of continuous discharge, requiring daily use of maxi pads. This has been ongoing for several years, and she has not yet consulted a gynecologist for this issue.  She has a history of a stroke, which has impacted her memory and possibly her  hearing, as she sometimes experiences a blocked sensation in her ears, similar to the feeling on an airplane. No hearing loss is reported, but the blocked sensation occurs occasionally.  She has been back from [place] for about three weeks and has not had a physical exam since May 2024. She is due for routine blood work, including cholesterol, kidney, liver, electrolytes, and thyroid  function tests. No issues with urination were reported. She walks daily for exercise and is considering dietary changes to manage her weight.   Past Medical History:  Diagnosis Date   Anxiety    on meds   Arthritis    bilateral knees   Cataract    RIGHT eye   Depression    on meds   Healthcare maintenance 06/20/2019   Herpes zoster kerstitis s/p corneal transplant 2015    History of strabismus surgery 03/31/2019   03/31/2019 1. Left medial rectus recession 6.2mm to11.73mm from limbus 2. Left medial rectus adjustable suture 3. Left inferior rectus 5.13mm central tenotomy 4. Left inferior rectus adjustable suture  Last Assessment & Plan:  03/31/2019 1. Left medial rectus recession 6.34mm to11.42mm from limbus 2. Left medial rectus adjustable suture 3. Left inferior rectus 5.73mm central tenotomy 4. Left inferior r   Hypertension    on meds   Right knee osteoarthritis    Status post total right knee replacement 10/10/2020   Strabismus    due to eye injury as a child   Stroke [facility]) 07/2019   stroke with craniotomy   Past Surgical History:  Procedure Laterality  Date   CESAREAN SECTION      x 2    CHOLECYSTECTOMY     COLONOSCOPY  04/22/2020   CORNEAL TRANSPLANT Left    2006 and 2016   CRANIOTOMY Left 08/09/2019   Procedure: CRANIOTOMY HEMATOMA EVACUATION SUBDURAL;  Surgeon: Gillie Duncans, MD;  Location: [facility];  Service: Neurosurgery;  Laterality: Left;  left   EYE SURGERY     TOTAL KNEE ARTHROPLASTY Right 10/10/2020   Procedure: RIGHT TOTAL KNEE ARTHROPLASTY;  Surgeon: Jerri Kay HERO, MD;  Location: [facility] OR;  Service:  Orthopedics;  Laterality: Right;   TUBAL LIGATION      No Known Allergies  Allergies as of 12/27/2023   No Known Allergies      Medication List        Accurate as of December 27, 2023 11:59 PM. If you have any questions, ask your nurse or doctor.          acetaminophen  500 MG tablet Commonly known as: TYLENOL  Take 1 tablet (500 mg total) by mouth every 6 (six) hours as needed.   amLODipine  10 MG tablet Commonly known as: NORVASC  Take 1 tablet (10 mg total) by mouth daily. APPOINTMENT DUE PRIOR TO ADDITIONAL REFILLS   aspirin  EC 81 MG tablet Take 1 tablet (81 mg total) by mouth daily. To be taken after surgery   atorvastatin  10 MG tablet Commonly known as: LIPITOR TAKE 1 TABLET AT BEDTIME. Needs an appointment before anymore future refills.   lisinopril  20 MG tablet Commonly known as: ZESTRIL  Take 1 tablet (20 mg total) by mouth daily. APPOINTMENT DUE PRIOR TO ADDITIONAL REFILLS   PARoxetine  20 MG tablet Commonly known as: PAXIL  Take 1 tablet (20 mg total) by mouth daily. Needs an appointment before anymore future refills.   predniSONE  10 MG (21) Tbpk tablet Commonly known as: STERAPRED UNI-PAK 21 TAB Take as directed        Review of Systems  Constitutional:  Negative for appetite change, chills, fatigue, fever and unexpected weight change.  HENT:  Negative for congestion, dental problem, ear discharge, ear pain, facial swelling, nosebleeds, postnasal drip, rhinorrhea, sinus pressure, sinus pain, sneezing, sore throat, tinnitus and trouble swallowing.        Hearing fullness in the ears  Eyes:  Negative for pain, discharge, redness, itching and visual disturbance.  Respiratory:  Negative for cough, chest tightness, shortness of breath and wheezing.   Cardiovascular:  Negative for chest pain, palpitations and leg swelling.  Gastrointestinal:  Negative for abdominal distention, abdominal pain, blood in stool, constipation, diarrhea, nausea and vomiting.   Endocrine: Negative for cold intolerance, heat intolerance, polydipsia, polyphagia and polyuria.  Genitourinary:  Negative for difficulty urinating, dysuria, flank pain, frequency and urgency.       Chronic vaginal discharge  Musculoskeletal:  Negative for arthralgias, back pain, gait problem, joint swelling, myalgias, neck pain and neck stiffness.  Skin:  Negative for color change, pallor, rash and wound.  Neurological:  Negative for dizziness, syncope, speech difficulty, weakness, light-headedness, numbness and headaches.  Hematological:  Does not bruise/bleed easily.  Psychiatric/Behavioral:  Negative for agitation, behavioral problems, confusion, hallucinations, self-injury, sleep disturbance and suicidal ideas. The patient is not nervous/anxious.     Immunization History  Administered Date(s) Administered   INFLUENZA, HIGH DOSE SEASONAL PF 12/27/2023   Influenza,inj,Quad PF,6+ Mos 01/09/2020   Moderna Sars-Covid-2 Vaccination 06/14/2019, 07/17/2019   PNEUMOCOCCAL CONJUGATE-20 07/01/2022   Pneumococcal Conjugate-13 04/23/2020   Pertinent  Health Maintenance Due  Topic Date Due  Mammogram  12/23/2022   Colonoscopy  04/23/2023   Influenza Vaccine  Completed   DEXA SCAN  Completed      07/23/2021    2:35 PM 01/04/2022    2:26 PM 07/01/2022   11:28 AM 08/19/2022    3:19 PM 12/27/2023   11:47 AM  Fall Risk  Falls in the past year? 0 0 0 0 0  Was there an injury with Fall? 0 0 0 0 0  Fall Risk Category Calculator 0 0 0 0 0  Fall Risk Category (Retired) Low  Low      (RETIRED) Patient Fall Risk Level Low fall risk  Low fall risk      Patient at Risk for Falls Due to No Fall Risks No Fall Risks  No Fall Risks No Fall Risks  Fall risk Follow up Falls evaluation completed;Falls prevention discussed  Falls evaluation completed   Falls evaluation completed Falls evaluation completed     Data saved with a previous flowsheet row definition   Functional Status Survey:    Vitals:    12/27/23 1153  BP: 132/84  Pulse: 64  Resp: 20  Temp: 97.6 F (36.4 C)  SpO2: 96%  Height: 4' 10 (1.473 m)   Body mass index is 37.7 kg/m. Physical Exam  GENERAL: Alert, cooperative, well developed, no acute distress. HEENT: Normocephalic, normal oropharynx, moist mucous membranes, ears normal bilaterally, nose normal, extraocular movements intact, no sinus tenderness. NECK: Neck normal. CHEST: Clear to auscultation bilaterally, no wheezes, rhonchi, or crackles. CARDIOVASCULAR: Normal heart rate and rhythm, S1 and S2 normal without murmurs. ABDOMEN: Soft, non-tender, non-distended, without organomegaly, normal bowel sounds, abdomen normal on deep palpation. GENITOURINARY: Pelvic exam deferred  EXTREMITIES: No cyanosis or edema. NEUROLOGICAL: Cranial nerves grossly intact, moves all extremities without gross motor or sensory deficit.  SKIN: No rash,no lesion or erythema   PSYCHIATRY/BEHAVIORAL: Mood stable  Labs reviewed: No results for input(s): NA, K, CL, CO2, GLUCOSE, BUN, CREATININE, CALCIUM , MG, PHOS in the last 8760 hours. No results for input(s): AST, ALT, ALKPHOS, BILITOT, PROT, ALBUMIN in the last 8760 hours. No results for input(s): WBC, NEUTROABS, HGB, HCT, MCV, PLT in the last 8760 hours. Lab Results  Component Value Date   TSH 5.93 (H) 07/01/2022   Lab Results  Component Value Date   HGBA1C 6.0 (H) 07/01/2022   Lab Results  Component Value Date   CHOL 222 (H) 07/01/2022   HDL 61 07/01/2022   LDLCALC 130 (H) 07/01/2022   TRIG 177 (H) 07/01/2022   CHOLHDL 3.6 07/01/2022    Significant Diagnostic Results in last 30 days:  No results found.  Assessment/Plan  Memory loss and cognitive impairment/Nontrumatic  cortical Hemorrhage of left cerebral  Memory issues present, possibly related to previous stroke or early signs of dementia or Alzheimer's disease. Difficulty retaining new information such as using household  appliances. - Refer to neurologist for further evaluation of memory issues.  Chronic low back pain Complaints of chronic lower back pain. Potential need for steroid injection discussed, but x-ray required first to determine further treatment. - Order x-ray of lower back. - Consider referral to orthopedic specialist based on x-ray results.  Abnormal vaginal discharge Chronic issue with non-urinary liquid discharge requiring daily pad use. No prior gynecological evaluation noted. - Refer to gynecologist for evaluation of abnormal discharge.  Hearing fullness, likely due to allergies Intermittent sensation of ear fullness, similar to ear blockage experienced during flights. Likely related to allergies. - Recommend taking Claritin to alleviate  ear fullness.  Essential hypertension No specific discussion of hypertension management during this visit.  General Health Maintenance Flu shot administered. Tetanus, shingles, and COVID vaccines are due. Colon cancer screening discussed with preference for Cologuard. Breast cancer screening also due. - Advise obtaining tetanus, shingles, and COVID vaccines at pharmacy. - Order Cologuard for colon cancer screening. - Schedule breast cancer screening appointment. - - Schedule blood work appointment for cholesterol, kidney function, liver function, electrolytes, and thyroid  function.   Family/ staff Communication: Reviewed plan of care with patient verbalized understanding   Labs/tests ordered:  - CBC with Differential/Platelet - CMP with eGFR([facility]) - TSH - Lipid panel - Screening Mammogram bilateral breast  - Cologuard    Next Appointment : Return in about 6 months (around 06/25/2024) for medical mangement of chronic issues., Fasting labs soon .   Spent 45 minutes of Face to face and non-face to face with patient  >50% time spent counseling; reviewing medical record; tests; labs; documentation and developing future plan of care.   Roxan JAYSON Plough, NP

## 2024-01-02 ENCOUNTER — Other Ambulatory Visit: Payer: Self-pay | Admitting: Family

## 2024-01-02 DIAGNOSIS — F419 Anxiety disorder, unspecified: Secondary | ICD-10-CM

## 2024-01-04 ENCOUNTER — Other Ambulatory Visit

## 2024-01-04 ENCOUNTER — Ambulatory Visit
Admission: RE | Admit: 2024-01-04 | Discharge: 2024-01-04 | Disposition: A | Discharge status: Home / Self Care | Source: Ambulatory Visit | Attending: Family | Admitting: Family

## 2024-01-04 DIAGNOSIS — Z1231 Encounter for screening mammogram for malignant neoplasm of breast: Secondary | ICD-10-CM | POA: Diagnosis not present

## 2024-01-04 DIAGNOSIS — I10 Essential (primary) hypertension: Secondary | ICD-10-CM

## 2024-01-04 DIAGNOSIS — E782 Mixed hyperlipidemia: Secondary | ICD-10-CM

## 2024-01-04 LAB — COMPREHENSIVE METABOLIC PANEL WITH GFR
AG Ratio: 1.2 (calc) (ref 1.0–2.5)
ALT: 13 U/L (ref 6–29)
AST: 19 U/L (ref 10–35)
Albumin: 3.9 g/dL (ref 3.6–5.1)
Alkaline phosphatase (APISO): 109 U/L (ref 37–153)
BUN/Creatinine Ratio: 25 (calc) — ABNORMAL HIGH (ref 6–22)
BUN: 15 mg/dL (ref 7–25)
CO2: 27 mmol/L (ref 20–32)
Calcium: 8.9 mg/dL (ref 8.6–10.4)
Chloride: 106 mmol/L (ref 98–110)
Creat: 0.59 mg/dL — ABNORMAL LOW (ref 0.60–1.00)
Globulin: 3.3 g/dL (ref 1.9–3.7)
Glucose, Bld: 99 mg/dL (ref 65–99)
Potassium: 4.3 mmol/L (ref 3.5–5.3)
Sodium: 141 mmol/L (ref 135–146)
Total Bilirubin: 0.5 mg/dL (ref 0.2–1.2)
Total Protein: 7.2 g/dL (ref 6.1–8.1)
eGFR: 96 mL/min/1.73m2 (ref >=60)

## 2024-01-04 LAB — TSH: TSH: 4.97 m[IU]/L — ABNORMAL HIGH (ref 0.40–4.50)

## 2024-01-04 LAB — CBC WITH DIFFERENTIAL/PLATELET
Absolute Lymphocytes: 2208 {cells}/uL (ref 850–3900)
Absolute Monocytes: 540 {cells}/uL (ref 200–950)
Basophils Absolute: 21 {cells}/uL (ref 0–200)
Basophils Relative: 0.3 %
Eosinophils Absolute: 121 {cells}/uL (ref 15–500)
Eosinophils Relative: 1.7 %
HCT: 40.3 % (ref 35.0–45.0)
Hemoglobin: 13.5 g/dL (ref 11.7–15.5)
MCH: 31.1 pg (ref 27.0–33.0)
MCHC: 33.5 g/dL (ref 32.0–36.0)
MCV: 92.9 fL (ref 80.0–100.0)
MPV: 10.1 fL (ref 7.5–12.5)
Monocytes Relative: 7.6 %
Neutro Abs: 4210 {cells}/uL (ref 1500–7800)
Neutrophils Relative %: 59.3 %
Platelets: 277 Thousand/uL (ref 140–400)
RBC: 4.34 Million/uL (ref 3.80–5.10)
RDW: 13.1 % (ref 11.0–15.0)
Total Lymphocyte: 31.1 %
WBC: 7.1 Thousand/uL (ref 3.8–10.8)

## 2024-01-04 LAB — LIPID PANEL
Cholesterol: 119 mg/dL (ref <200)
HDL: 57 mg/dL (ref >=50)
LDL Cholesterol (Calc): 45 mg/dL
Non-HDL Cholesterol (Calc): 62 mg/dL (ref <130)
Total CHOL/HDL Ratio: 2.1 (calc) (ref <5.0)
Triglycerides: 89 mg/dL (ref <150)

## 2024-01-05 ENCOUNTER — Other Ambulatory Visit: Payer: Self-pay | Admitting: Family

## 2024-01-05 DIAGNOSIS — R413 Other amnesia: Secondary | ICD-10-CM

## 2024-01-05 DIAGNOSIS — F32A Depression, unspecified: Secondary | ICD-10-CM

## 2024-01-05 MED ORDER — PAROXETINE HCL 20 MG PO TABS: 20.0000 mg | ORAL_TABLET | Freq: Every day | ORAL | 1 refills | Status: DC

## 2024-01-05 NOTE — Telephone Encounter (Signed)
 Paroxetine  refill was approved on 01/03/24

## 2024-01-06 ENCOUNTER — Ambulatory Visit: Payer: Self-pay | Admitting: Family

## 2024-01-06 DIAGNOSIS — R7989 Other specified abnormal findings of blood chemistry: Secondary | ICD-10-CM

## 2024-01-10 DIAGNOSIS — Z1211 Encounter for screening for malignant neoplasm of colon: Secondary | ICD-10-CM | POA: Diagnosis not present

## 2024-01-12 ENCOUNTER — Ambulatory Visit (INDEPENDENT_AMBULATORY_CARE_PROVIDER_SITE_OTHER): Admitting: Physical Medicine and Rehabilitation

## 2024-01-12 ENCOUNTER — Encounter: Payer: Self-pay | Admitting: Physical Medicine and Rehabilitation

## 2024-01-12 DIAGNOSIS — M47816 Spondylosis without myelopathy or radiculopathy, lumbar region: Secondary | ICD-10-CM | POA: Diagnosis not present

## 2024-01-12 DIAGNOSIS — G8929 Other chronic pain: Secondary | ICD-10-CM | POA: Diagnosis not present

## 2024-01-12 DIAGNOSIS — M545 Low back pain, unspecified: Secondary | ICD-10-CM

## 2024-01-12 DIAGNOSIS — M47819 Spondylosis without myelopathy or radiculopathy, site unspecified: Secondary | ICD-10-CM

## 2024-01-12 NOTE — Progress Notes (Signed)
 Pain Scale   Average Pain 4 Patient advising she has chronic lower back pain that radiates bilaterally to hips and legs.         +Driver, -BT, -Dye Allergies.

## 2024-01-12 NOTE — Progress Notes (Signed)
 Michelle Carrillo - 73 y.o. female MRN [id number]  Date of birth: December 05, 1950  Office Visit Note: Visit Date: 01/12/2024 PCP: Leonarda Roxan BROCKS, NP Referred by: Leonarda Roxan BROCKS, NP  Subjective: Chief Complaint  Patient presents with   Lower Back - Pain   HPI: Michelle Carrillo is a 73 y.o. female who comes in today per the request of Roxan Leonarda, NP for evaluation of chronic, worsening and severe bilateral lower back pain radiating to hips. Daughter at bedside to help with interpretation. Pain ongoing for several years, worsens with standing, cleaning and mopping the floor. She describes pain as sore and aching, currently rates as 8 out of 10. Some relief of pain with home exercise regimen, rest and use of medications. History of formal physical therapy for her lower back, some relief of pain with these treatments. Lumbar radiographs from 2024 show multi level degenerative changes and facet arthropathy. No prior lumbar MRI imaging. No history of lumbar surgery/injections. No recent trauma or falls.   History of stroke in 2022, daughter reports visual issues and expressive aphasia post stroke. Her daughter reports continued issues with memory loss. Reports difficulty using household appliances like microwave, also reports difficulty charging cell phone. She has upcoming appointment with neurology.      Review of Systems  Musculoskeletal:  Positive for back pain.  Neurological:  Negative for tingling, sensory change, focal weakness and weakness.  All other systems reviewed and are negative.  Otherwise per HPI.  Assessment & Plan: Visit Diagnoses:    ICD-10-CM   1. Chronic bilateral low back pain without sciatica  M54.50    G89.29     2. Spondylosis without myelopathy or radiculopathy  M47.819     3. Facet arthropathy, lumbar  M47.816        Plan: Findings:  Chronic, worsening and severe bilateral lower back pain radiating to hips. No radiating pain down the legs. Lower  back pain becomes severe with household tasks such as standing to cook and clean. She continues to have severe pain despite good conservative therapies such as formal physical therapy, home exercise regimen, rest and use of medications. Patients clinical presentation and exam are consistent with facet mediated pain. She does have pain with lumbar extension today. We discussed treatment plan in detail today. Next step is to place order for lumbar MRI imaging. Depending on results of lumbar MRI imaging we discussed possibility of performing lumbar facet joint injections. Patient and daughter have no questions at this time. No red flag symptoms noted upon exam today.     Meds & Orders: No orders of the defined types were placed in this encounter.  No orders of the defined types were placed in this encounter.   Follow-up: Return for Lumbar MRI review.   Procedures: No procedures performed      Clinical History: No specialty comments available.   She reports that she has never smoked. She has never used smokeless tobacco. No results for input(s): HGBA1C, LABURIC in the last 8760 hours.  Objective:  VS:  HT:    WT:   BMI:     BP:   HR: bpm  TEMP: ( )  RESP:  Physical Exam Vitals and nursing note reviewed.  HENT:     Head: Normocephalic and atraumatic.     Right Ear: External ear normal.     Left Ear: External ear normal.     Nose: Nose normal.     Mouth/Throat:     Mouth:  Mucous membranes are moist.  Eyes:     Extraocular Movements: Extraocular movements intact.  Cardiovascular:     Rate and Rhythm: Normal rate.     Pulses: Normal pulses.  Pulmonary:     Effort: Pulmonary effort is normal.  Abdominal:     General: Abdomen is flat. There is no distension.  Musculoskeletal:        General: Tenderness present.     Cervical back: Normal range of motion.     Comments: Patient is slow to rise from seated position to standing. Pain noted with facet loading and lumbar extension.  5/5 strength noted with bilateral hip flexion, knee flexion/extension, ankle dorsiflexion/plantarflexion and EHL. No clonus noted bilaterally. No pain upon palpation of greater trochanters. No pain with internal/external rotation of bilateral hips. Sensation intact bilaterally. Negative slump test bilaterally. Ambulates without aid, gait steady.     Skin:    General: Skin is warm and dry.     Capillary Refill: Capillary refill takes less than 2 seconds.  Neurological:     General: No focal deficit present.     Mental Status: She is alert and oriented to person, place, and time.  Psychiatric:        Mood and Affect: Mood normal.        Behavior: Behavior normal.     Ortho Exam  Imaging: No results found.  Past Medical/Family/Surgical/Social History: Medications & Allergies reviewed per EMR, new medications updated. Patient Active Problem List   Diagnosis Date Noted   Lumbar radiculopathy 08/03/2022   Chronic midline low back pain without sciatica 08/03/2022   Prediabetes 07/25/2021   Postmenopausal bleeding 06/24/2021   Vitamin D deficiency 04/24/2020   Postprandial RUQ pain 01/14/2020   Nontraumatic cortical hemorrhage of left cerebral hemisphere (HCC) 09/17/2019   Hyperlipemia 08/23/2019   ICH (intracerebral hemorrhage) (HCC) L parietal w/ L SDH s/p crani 08/09/2019   Onychomycosis 04/03/2019   Moderate, recurrent MDD 06/06/2018   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 06/06/2018   Primary osteoarthritis of right knee 06/05/2018   Essential hypertension 06/05/2018   Anxiety 06/05/2018   Age-related nuclear cataract of both eyes 11/26/2014   Diplopia 11/26/2014   Monocular esotropia of left eye 11/26/2014   Past Medical History:  Diagnosis Date   Anxiety    on meds   Arthritis    bilateral knees   Cataract    RIGHT eye   Depression    on meds   Healthcare maintenance 06/20/2019   Herpes zoster kerstitis s/p corneal transplant 2015     History of strabismus surgery 03/31/2019   03/31/2019 1. Left medial rectus recession 6.93mm to11.8mm from limbus 2. Left medial rectus adjustable suture 3. Left inferior rectus 5.45mm central tenotomy 4. Left inferior rectus adjustable suture  Last Assessment & Plan:  03/31/2019 1. Left medial rectus recession 6.64mm to11.61mm from limbus 2. Left medial rectus adjustable suture 3. Left inferior rectus 5.62mm central tenotomy 4. Left inferior r   Hypertension    on meds   Right knee osteoarthritis    Status post total right knee replacement 10/10/2020   Strabismus    due to eye injury as a child   Stroke [facility]) 07/2019   stroke with craniotomy   Family History  Problem Relation Age of Onset   Diabetes Mother    Ovarian cancer Mother    High blood pressure Sister    Diabetes Sister    Diabetes Brother    Stomach cancer  Maternal Grandmother 41   Colon polyps Neg Hx    Colon cancer Neg Hx    Rectal cancer Neg Hx    Esophageal cancer Neg Hx    Past Surgical History:  Procedure Laterality Date   CESAREAN SECTION      x 2    CHOLECYSTECTOMY     COLONOSCOPY  04/22/2020   CORNEAL TRANSPLANT Left    2006 and 2016   CRANIOTOMY Left 08/09/2019   Procedure: CRANIOTOMY HEMATOMA EVACUATION SUBDURAL;  Surgeon: Gillie Duncans, MD;  Location: [facility] OR;  Service: Neurosurgery;  Laterality: Left;  left   EYE SURGERY     TOTAL KNEE ARTHROPLASTY Right 10/10/2020   Procedure: RIGHT TOTAL KNEE ARTHROPLASTY;  Surgeon: Jerri Kay HERO, MD;  Location: [facility] OR;  Service: Orthopedics;  Laterality: Right;   TUBAL LIGATION     Social History   Occupational History   Not on file  Tobacco Use   Smoking status: Never   Smokeless tobacco: Never  Vaping Use   Vaping status: Never Used  Substance and Sexual Activity   Alcohol use: Never   Drug use: Never   Sexual activity: Not on file

## 2024-01-13 ENCOUNTER — Ambulatory Visit

## 2024-01-13 VITALS — Ht <= 58 in | Wt 174.2 lb

## 2024-01-13 DIAGNOSIS — M205X2 Other deformities of toe(s) (acquired), left foot: Secondary | ICD-10-CM | POA: Diagnosis not present

## 2024-01-13 DIAGNOSIS — L84 Corns and callosities: Secondary | ICD-10-CM

## 2024-01-13 DIAGNOSIS — L089 Local infection of the skin and subcutaneous tissue, unspecified: Secondary | ICD-10-CM

## 2024-01-13 DIAGNOSIS — B351 Tinea unguium: Secondary | ICD-10-CM

## 2024-01-13 NOTE — Progress Notes (Signed)
 Subjective:   Patient ID: Michelle Carrillo, female   DOB: 73 y.o.   MRN: [id number]   Toe Pain    Chief Complaint  Patient presents with   Toe Pain    RM 10 right foot small toe infection   73 year old female presents the office today with her daughter and an interpreter for the above concerns.  She was last seen in our office approximately 2 years ago for hyperkeratotic lesion to her left fifth toe and onychomycosis.      Review of Systems  All other systems reviewed and are negative.  Past Medical History:  Diagnosis Date   Anxiety    on meds   Arthritis    bilateral knees   Cataract    RIGHT eye   Depression    on meds   Healthcare maintenance 06/20/2019   Herpes zoster kerstitis s/p corneal transplant 2015    History of strabismus surgery 03/31/2019   03/31/2019 1. Left medial rectus recession 6.28mm to11.83mm from limbus 2. Left medial rectus adjustable suture 3. Left inferior rectus 5.36mm central tenotomy 4. Left inferior rectus adjustable suture  Last Assessment & Plan:  03/31/2019 1. Left medial rectus recession 6.102mm to11.15mm from limbus 2. Left medial rectus adjustable suture 3. Left inferior rectus 5.14mm central tenotomy 4. Left inferior r   Hypertension    on meds   Right knee osteoarthritis    Status post total right knee replacement 10/10/2020   Strabismus    due to eye injury as a child   Stroke (HCC) 07/2019   stroke with craniotomy    Past Surgical History:  Procedure Laterality Date   CESAREAN SECTION      x 2    CHOLECYSTECTOMY     COLONOSCOPY  04/22/2020   CORNEAL TRANSPLANT Left    2006 and 2016   CRANIOTOMY Left 08/09/2019   Procedure: CRANIOTOMY HEMATOMA EVACUATION SUBDURAL;  Surgeon: Gillie Duncans, MD;  Location: [facility];  Service: Neurosurgery;  Laterality: Left;  left   EYE SURGERY     TOTAL KNEE ARTHROPLASTY Right 10/10/2020   Procedure: RIGHT TOTAL KNEE ARTHROPLASTY;  Surgeon: Jerri Kay HERO, MD;  Location: [facility];  Service: Orthopedics;   Laterality: Right;   TUBAL LIGATION       Current Outpatient Medications:    acetaminophen  (TYLENOL ) 500 MG tablet, Take 1 tablet (500 mg total) by mouth every 6 (six) hours as needed., Disp: 30 tablet, Rfl: 3   amLODipine  (NORVASC ) 10 MG tablet, Take 1 tablet (10 mg total) by mouth daily. APPOINTMENT DUE PRIOR TO ADDITIONAL REFILLS, Disp: 90 tablet, Rfl: 1   aspirin  EC 81 MG tablet, Take 1 tablet (81 mg total) by mouth daily. To be taken after surgery, Disp: 30 tablet, Rfl: 2   atorvastatin  (LIPITOR) 10 MG tablet, TAKE 1 TABLET AT BEDTIME. Needs an appointment before anymore future refills., Disp: 30 tablet, Rfl: 3   lisinopril  (ZESTRIL ) 20 MG tablet, Take 1 tablet (20 mg total) by mouth daily. APPOINTMENT DUE PRIOR TO ADDITIONAL REFILLS, Disp: 90 tablet, Rfl: 1   predniSONE  (STERAPRED UNI-PAK 21 TAB) 10 MG (21) TBPK tablet, Take as directed, Disp: 21 tablet, Rfl: 3   PARoxetine  (PAXIL ) 20 MG tablet, Take 1 tablet (20 mg total) by mouth daily., Disp: 90 tablet, Rfl: 1  No Known Allergies        Objective:  Physical Exam  General: AAO x3, NAD-translator present  Dermatological: Hyperkeratotic lesion left fifth toe without any underlying ulceration drainage or  signs of infection.  Hallux nails are hypertrophic, dystrophic with yellow, brown discoloration.  No hyperpigmentation.  No edema, erythema or signs of infection to the toenail sites.  No other open lesions noted.  Vascular: Dorsalis Pedis artery and Posterior Tibial artery pedal pulses are 2/4 bilateral with immedate capillary fill time.  There is no pain with calf compression, swelling, warmth, erythema.   Neruologic: Grossly intact via light touch bilateral.    Musculoskeletal: Tenderness to hyperkeratotic lesion.  At the base of the fifth toe noted.  Gait: Unassisted, Nonantalgic.       Assessment:   Onychomycosis, hyperkeratotic lesion due to adductovarus     Plan:  -Treatment options discussed including all  alternatives, risks, and complications -Etiology of symptoms were discussed -Discussed treatment options for nail fungus.  After discussion she wants to hold off on oral medication. We discussed the benefits and risks of lamisil at length.  - Sharply debrided bilateral hallux nails -Sharply debrided the hyperkeratotic lesion on the left fifth toe without any complications or bleeding the patient comfort.  Dispensed silicone toe sleeves.  Discussed shoe modifications to avoid excess pressure.  In the future consider surgical intervention if needed.  Prentice PARAS Joah Patlan DPM

## 2024-01-18 LAB — COLOGUARD: COLOGUARD: NEGATIVE

## 2024-01-30 ENCOUNTER — Encounter: Admitting: Adult Health

## 2024-02-01 ENCOUNTER — Telehealth: Payer: Self-pay

## 2024-02-01 NOTE — Telephone Encounter (Signed)
 Copied from CRM #[id number]. Topic: Clinical - Home Health Verbal Orders >> Feb 01, 2024  2:21 PM Miquel SAILOR wrote: Caller/Agency: Red[facility] from [facility]  Callback Number: [phone] ok for Detail  message  Service Requested: Physical Therapy Frequency: 1time 9 weeks Speech on language pathology request for PT Any new concerns about the patient? No

## 2024-02-01 NOTE — Telephone Encounter (Signed)
 Call returned to [facility] and verbal orders authorized per [employer] standing order

## 2024-02-02 ENCOUNTER — Ambulatory Visit: Admitting: Obstetrics

## 2024-02-02 ENCOUNTER — Other Ambulatory Visit (HOSPITAL_COMMUNITY)
Admission: RE | Admit: 2024-02-02 | Discharge: 2024-02-02 | Disposition: A | Source: Ambulatory Visit | Attending: Obstetrics | Admitting: Obstetrics

## 2024-02-02 ENCOUNTER — Encounter: Payer: Self-pay | Admitting: Obstetrics

## 2024-02-02 VITALS — BP 137/81 | HR 81 | Ht 59.0 in | Wt 172.4 lb

## 2024-02-02 DIAGNOSIS — Z1151 Encounter for screening for human papillomavirus (HPV): Secondary | ICD-10-CM | POA: Diagnosis not present

## 2024-02-02 DIAGNOSIS — Z124 Encounter for screening for malignant neoplasm of cervix: Secondary | ICD-10-CM | POA: Diagnosis not present

## 2024-02-02 DIAGNOSIS — Z947 Corneal transplant status: Secondary | ICD-10-CM | POA: Diagnosis not present

## 2024-02-02 DIAGNOSIS — I10 Essential (primary) hypertension: Secondary | ICD-10-CM

## 2024-02-02 DIAGNOSIS — H524 Presbyopia: Secondary | ICD-10-CM | POA: Diagnosis not present

## 2024-02-02 DIAGNOSIS — H26492 Other secondary cataract, left eye: Secondary | ICD-10-CM | POA: Diagnosis not present

## 2024-02-02 DIAGNOSIS — R7303 Prediabetes: Secondary | ICD-10-CM

## 2024-02-02 DIAGNOSIS — Z01419 Encounter for gynecological examination (general) (routine) without abnormal findings: Secondary | ICD-10-CM | POA: Diagnosis not present

## 2024-02-02 DIAGNOSIS — Z113 Encounter for screening for infections with a predominantly sexual mode of transmission: Secondary | ICD-10-CM | POA: Diagnosis not present

## 2024-02-02 DIAGNOSIS — E66811 Obesity, class 1: Secondary | ICD-10-CM

## 2024-02-02 DIAGNOSIS — H35363 Drusen (degenerative) of macula, bilateral: Secondary | ICD-10-CM | POA: Diagnosis not present

## 2024-02-02 DIAGNOSIS — N898 Other specified noninflammatory disorders of vagina: Secondary | ICD-10-CM

## 2024-02-02 DIAGNOSIS — F419 Anxiety disorder, unspecified: Secondary | ICD-10-CM | POA: Diagnosis not present

## 2024-02-02 DIAGNOSIS — H52213 Irregular astigmatism, bilateral: Secondary | ICD-10-CM | POA: Diagnosis not present

## 2024-02-02 DIAGNOSIS — I6609 Occlusion and stenosis of unspecified middle cerebral artery: Secondary | ICD-10-CM | POA: Diagnosis not present

## 2024-02-02 DIAGNOSIS — H2511 Age-related nuclear cataract, right eye: Secondary | ICD-10-CM | POA: Diagnosis not present

## 2024-02-02 NOTE — Progress Notes (Signed)
 Subjective:        Michelle Carrillo is a 73 y.o. female here for a routine exam.  Current complaints: None.    Personal health questionnaire:  Is patient Michelle Carrillo, have a family history of breast and/or ovarian cancer: yes Is there a family history of uterine cancer diagnosed at age < 68, gastrointestinal cancer, urinary tract cancer, family member who is a Personnel Officer syndrome-associated carrier: yes Is the patient overweight and hypertensive, family history of diabetes, personal history of gestational diabetes, preeclampsia or PCOS: yes Is patient over 71, have PCOS,  family history of premature CHD under age 73, diabetes, smoke, have hypertension or peripheral artery disease:  yes At any time, has a partner hit, kicked or otherwise hurt or frightened you?: no Over the past 2 weeks, have you felt down, depressed or hopeless?: no Over the past 2 weeks, have you felt little interest or pleasure in doing things?:no   Gynecologic History No LMP recorded. Patient is postmenopausal. Contraception: post menopausal status Last Pap: 2023. Results were: normal Last mammogram: 2025. Results were: normal  Obstetric History OB History  Gravida Para Term Preterm AB Living  5 4 3 1 1 4   SAB IAB Ectopic Multiple Live Births  1        # Outcome Date GA Lbr Len/2nd Weight Sex Type Anes PTL Lv  5 Preterm           4 Term           3 Term           2 Term           1 SAB             Past Medical History:  Diagnosis Date   Anxiety    on meds   Arthritis    bilateral knees   Cataract    RIGHT eye   Depression    on meds   Healthcare maintenance 06/20/2019   Herpes zoster kerstitis s/p corneal transplant 2015    History of strabismus surgery 03/31/2019   03/31/2019 1. Left medial rectus recession 6.79mm to11.87mm from limbus 2. Left medial rectus adjustable suture 3. Left inferior rectus 5.69mm central tenotomy 4. Left inferior rectus adjustable suture  Last Assessment & Plan:   03/31/2019 1. Left medial rectus recession 6.35mm to11.69mm from limbus 2. Left medial rectus adjustable suture 3. Left inferior rectus 5.2mm central tenotomy 4. Left inferior r   Hypertension    on meds   Right knee osteoarthritis    Status post total right knee replacement 10/10/2020   Strabismus    due to eye injury as a child   Stroke (HCC) 07/2019   stroke with craniotomy    Past Surgical History:  Procedure Laterality Date   CESAREAN SECTION      x 2    CHOLECYSTECTOMY     COLONOSCOPY  04/22/2020   CORNEAL TRANSPLANT Left    2006 and 2016   CRANIOTOMY Left 08/09/2019   Procedure: CRANIOTOMY HEMATOMA EVACUATION SUBDURAL;  Surgeon: Gillie Duncans, MD;  Location: [facility] OR;  Service: Neurosurgery;  Laterality: Left;  left   EYE SURGERY     TOTAL KNEE ARTHROPLASTY Right 10/10/2020   Procedure: RIGHT TOTAL KNEE ARTHROPLASTY;  Surgeon: Jerri Kay HERO, MD;  Location: [facility] OR;  Service: Orthopedics;  Laterality: Right;   TUBAL LIGATION       Current Outpatient Medications:    acetaminophen  (TYLENOL ) 500 MG tablet, Take 1 tablet (  500 mg total) by mouth every 6 (six) hours as needed., Disp: 30 tablet, Rfl: 3   amLODipine  (NORVASC ) 10 MG tablet, Take 1 tablet (10 mg total) by mouth daily. APPOINTMENT DUE PRIOR TO ADDITIONAL REFILLS, Disp: 90 tablet, Rfl: 1   aspirin  EC 81 MG tablet, Take 1 tablet (81 mg total) by mouth daily. To be taken after surgery, Disp: 30 tablet, Rfl: 2   atorvastatin  (LIPITOR) 10 MG tablet, TAKE 1 TABLET AT BEDTIME. Needs an appointment before anymore future refills. (Patient not taking: Reported on 02/02/2024), Disp: 30 tablet, Rfl: 3   lisinopril  (ZESTRIL ) 20 MG tablet, Take 1 tablet (20 mg total) by mouth daily. APPOINTMENT DUE PRIOR TO ADDITIONAL REFILLS (Patient not taking: Reported on 02/02/2024), Disp: 90 tablet, Rfl: 1   metroNIDAZOLE (FLAGYL) 500 MG tablet, Take 1 tablet (500 mg total) by mouth 2 (two) times daily., Disp: 14 tablet, Rfl: 2   PARoxetine  (PAXIL ) 20 MG  tablet, Take 1 tablet (20 mg total) by mouth daily., Disp: 90 tablet, Rfl: 1   predniSONE  (STERAPRED UNI-PAK 21 TAB) 10 MG (21) TBPK tablet, Take as directed (Patient not taking: Reported on 02/02/2024), Disp: 21 tablet, Rfl: 3 No Known Allergies  Social History   Tobacco Use   Smoking status: Never   Smokeless tobacco: Never  Substance Use Topics   Alcohol use: Never    Family History  Problem Relation Age of Onset   Diabetes Mother    Ovarian cancer Mother    High blood pressure Sister    Diabetes Sister    Diabetes Brother    Stomach cancer Maternal Grandmother 75   Colon polyps Neg Hx    Colon cancer Neg Hx    Rectal cancer Neg Hx    Esophageal cancer Neg Hx       Review of Systems  Constitutional: negative for fatigue and weight loss Respiratory: negative for cough and wheezing Cardiovascular: negative for chest pain, fatigue and palpitations Gastrointestinal: negative for abdominal pain and change in bowel habits Musculoskeletal:negative for myalgias Neurological: negative for gait problems and tremors Behavioral/Psych:  positive for anxiety.  negative for abusive relationship Endocrine: negative for temperature intolerance    Genitourinary: positive for vaginal discharge.  negative for abnormal menstrual periods, genital lesions, hot flashes, sexual problems  Integument/breast: negative for breast lump, breast tenderness, nipple discharge and skin lesion(s)    Objective:       BP 137/81   Pulse 81   Ht 4' 11 (1.499 m)   Wt 172 lb 6.4 oz (78.2 kg)   BMI 34.82 kg/m  General:   Alert and no distress  Skin:   no rash or abnormalities  Lungs:   clear to auscultation bilaterally  Heart:   regular rate and rhythm, S1, S2 normal, no murmur, click, rub or gallop  Breasts:   normal without suspicious masses, skin or nipple changes or axillary nodes  Abdomen:  normal findings: no organomegaly, soft, non-tender and no hernia  Pelvis:  External genitalia: normal  general appearance Urinary system: urethral meatus normal and bladder without fullness, nontender Vaginal: normal without tenderness, induration or masses Cervix: normal appearance Adnexa: normal bimanual exam Uterus: anteverted and non-tender, normal size   Lab Review Urine pregnancy test Labs reviewed yes Radiologic studies reviewed yes  I have spent a total of 20 minutes of face-to-face time, excluding clinical staff time, reviewing notes and preparing to see patient, ordering tests and/or medications, and counseling the patient.   Assessment:    .  1. Encounter for routine gynecological examination with Papanicolaou smear of cervix (Primary) Rx: - Cytology - PAP( [facility])  2. Vaginal discharge Rx: - Cervicovaginal ancillary only( [facility])  3. Anxiety - clinically stable.  Managed by PCP.  4. Essential hypertension - clinically stable.  Managed by PCP  5. Prediabetes - managed by PCP  6. Obesity (BMI 30.0-34.9) - weight reduction with the aid of dietary changes, exercise and behavioral modification recommended     Plan:    Education reviewed: calcium  supplements, depression evaluation, low fat, low cholesterol diet, safe sex/STD prevention, self breast exams, and weight bearing exercise. Follow up in: 1 year.    CARLIN RONAL CENTERS, FACOG Attending Obstetrician & Gynecologist, [facility] for [facility], [facility], [place] 02/02/2024

## 2024-02-02 NOTE — Progress Notes (Signed)
 Pt presents for annual. Pt states that she has a lot of discharge, odor, no irritation but has to wear a panty liner. Pt scored 9 on PHQ-9 and 5 on GAD-7. Pt does not want a referral.

## 2024-02-03 LAB — CERVICOVAGINAL ANCILLARY ONLY
Bacterial Vaginitis (gardnerella): POSITIVE — AB
Candida Glabrata: NEGATIVE
Candida Vaginitis: NEGATIVE
Chlamydia: NEGATIVE
Comment: NEGATIVE
Comment: NEGATIVE
Comment: NEGATIVE
Comment: NEGATIVE
Comment: NEGATIVE
Comment: NORMAL
Neisseria Gonorrhea: NEGATIVE
Trichomonas: NEGATIVE

## 2024-02-05 ENCOUNTER — Other Ambulatory Visit

## 2024-02-06 ENCOUNTER — Ambulatory Visit: Payer: Self-pay | Admitting: Obstetrics

## 2024-02-06 DIAGNOSIS — B9689 Other specified bacterial agents as the cause of diseases classified elsewhere: Secondary | ICD-10-CM

## 2024-02-06 LAB — CYTOLOGY - PAP
Adequacy: ABSENT
Comment: NEGATIVE
Diagnosis: NEGATIVE
High risk HPV: NEGATIVE

## 2024-02-06 MED ORDER — METRONIDAZOLE 500 MG PO TABS: 500.0000 mg | ORAL_TABLET | Freq: Two times a day (BID) | ORAL | 2 refills | Status: DC

## 2024-02-09 MED ORDER — METRONIDAZOLE 0.75 % VA GEL: 1.0000 | Freq: Every day | VAGINAL | 0 refills | Status: AC

## 2024-02-13 ENCOUNTER — Encounter: Payer: Self-pay | Admitting: Radiology

## 2024-02-13 DIAGNOSIS — G8929 Other chronic pain: Secondary | ICD-10-CM | POA: Diagnosis not present

## 2024-02-13 DIAGNOSIS — E782 Mixed hyperlipidemia: Secondary | ICD-10-CM | POA: Diagnosis not present

## 2024-02-13 DIAGNOSIS — F32A Depression, unspecified: Secondary | ICD-10-CM

## 2024-02-13 DIAGNOSIS — Z8673 Personal history of transient ischemic attack (TIA), and cerebral infarction without residual deficits: Secondary | ICD-10-CM

## 2024-02-13 DIAGNOSIS — M545 Low back pain, unspecified: Secondary | ICD-10-CM | POA: Diagnosis not present

## 2024-02-13 DIAGNOSIS — F419 Anxiety disorder, unspecified: Secondary | ICD-10-CM

## 2024-02-13 DIAGNOSIS — I10 Essential (primary) hypertension: Secondary | ICD-10-CM | POA: Diagnosis not present

## 2024-02-13 DIAGNOSIS — Z604 Social exclusion and rejection: Secondary | ICD-10-CM

## 2024-02-22 ENCOUNTER — Ambulatory Visit: Payer: Self-pay

## 2024-02-22 NOTE — Telephone Encounter (Signed)
 Reason for Disposition . [1] Hallucinations AND [2] getting WORSE (e.g., sleeping poorly, less able to do activities of daily living)  Answer Assessment - Initial Assessment Questions No available appts today. Scheduled 02/23/24 Advised call back or [facility]/ED/911 if symptoms worsen.  Pt's daughter requesting CT Scan.  Provided patient's daughter with BH Resources: [facility] and [facility] 24 hour help line.  1. MAIN CONCERN: What happened that made you call today?     Visual halluciations; last night saying someone was in house last night 2. DESCRIPTION: What are the hallucinations like? Describe them for me. (e.g., auditory, visual, tactile; worsening; threatening) If auditory, ask Are they telling you to hurt yourself or someone else?     no 3. ONSET: When did this start? (e.g., hours, days, months)     Last night, been going for past month 4. PATTERN: Do they come and go, or are they present all the time?  Are they present now?     Whenever she is alone; last episode last night 5. PRIOR HALLUCINATIONS: Have you had hallucinations in the past? (e.g., same, different, worse)     worse 6. MENTAL HEALTH HISTORY: Have you ever been diagnosed with schizophrenia or any other mental health problem? (e.g., bipolar disorder, schizophrenia; dementia, Parkinsons)     No ; but scheduled for CT scan. Had previous stroke 7. MENTAL HEALTH MEDICINES: Are you taking any medicines for depression or other mental health problems? (e.g., psychiatric medicines; sleeping pills)     unsure  9. MEDICINE CHANGES: Did you recently stop taking a medicine? (e.g., antidepressant, barbiturates, benzodiazepine, gabapentin)  Did you recently start a new medicine or increase the dose? (e.g., antidepressant, digoxin, sleep medicine, steroid, Parkinson's medicine)    Stopped taking antidepressant on her on three months ago  12. OTHER SYMPTOMS: Are there any other symptoms? (e.g., difficulty breathing,  headache, fever, weakness)       She currently fine, she's safe.  Denies HA, blurred vision, weakness/ numbness, fever, chills  Protocols used: Hallucinations-A-AH

## 2024-02-22 NOTE — Telephone Encounter (Addendum)
 FYI Only or Action Required?: Action required by provider: No contact made, follow up needed.  Patient was last seen in primary care on 12/27/2023 by Ngetich, Roxan BROCKS, NP.  Called Nurse Triage reporting Hallucinations.  Symptoms began no contact.  Interventions attempted: Other: no contact.  Symptoms are: no contact.  Triage Disposition: No Contact Calls  Patient/caregiver understands and will follow disposition?:    Copied from CRM [id number]. Topic: Clinical - Red Word Triage >> Feb 22, 2024  9:19 AM Michelle Carrillo wrote: Red Word that prompted transfer to Nurse Triage: Had a stroke - a couple years back - has been hallucinating - seeing things that's not there would like to make sure its not reoccurrence of stroke

## 2024-02-22 NOTE — Telephone Encounter (Signed)
 Using [employer] [phone]. Patient called, left VM to return the call to the office to speak to NT.

## 2024-02-22 NOTE — Telephone Encounter (Addendum)
 FYI Only or Action Required?: Action required by provider: clinical question for provider.  Patient was last seen in primary care on 12/27/2023 by Ngetich, Roxan BROCKS, NP.  Called Nurse Triage reporting Hallucinations.  Symptoms began yesterday.  Interventions attempted: Nothing.  Symptoms are: unchanged.  Triage Disposition: See Physician Within 24 Hours, No Contact Calls  Patient/caregiver understands and will follow disposition?: Yes   No available appts today. Scheduled 02/23/24 Advised call back or [facility]/ED/911 if symptoms worsen.  Pt's daughter requesting CT Scan.  Provided patient's daughter with BH Resources: [facility] and [facility] 24 hour help line.  1. MAIN CONCERN: What happened that made you call today?     Visual halluciations; last night saying someone was in house last night 2. DESCRIPTION: What are the hallucinations like? Describe them for me. (e.g., auditory, visual, tactile; worsening; threatening) If auditory, ask Are they telling you to hurt yourself or someone else?     no 3. ONSET: When did this start? (e.g., hours, days, months)     Last night, been going for past month 4. PATTERN: Do they come and go, or are they present all the time?  Are they present now?     Whenever she is alone; last episode last night 5. PRIOR HALLUCINATIONS: Have you had hallucinations in the past? (e.g., same, different, worse)     worse 6. MENTAL HEALTH HISTORY: Have you ever been diagnosed with schizophrenia or any other mental health problem? (e.g., bipolar disorder, schizophrenia; dementia, Parkinsons)     No ; but scheduled for CT scan. Had previous stroke 7. MENTAL HEALTH MEDICINES: Are you taking any medicines for depression or other mental health problems? (e.g., psychiatric medicines; sleeping pills)     unsure  9. MEDICINE CHANGES: Did you recently stop taking a medicine? (e.g., antidepressant, barbiturates, benzodiazepine, gabapentin)  Did you recently start a  new medicine or increase the dose? (e.g., antidepressant, digoxin, sleep medicine, steroid, Parkinson's medicine)    Stopped taking antidepressant on her on three months ago  12. OTHER SYMPTOMS: Are there any other symptoms? (e.g., difficulty breathing, headache, fever, weakness)       She currently fine, she's safe.  Denies HA, blurred vision, weakness/ numbness, fever, chills

## 2024-02-22 NOTE — Telephone Encounter (Signed)
 Will evaluate symptoms during visit 02/23/2024 my go to ED if symptoms worsen.

## 2024-02-22 NOTE — Telephone Encounter (Signed)
 Reason for Disposition . Third attempt to contact caller AND no contact made. Phone number verified. . Message left on unidentified voice mail.  Phone number verified.  Protocols used: No Contact or Duplicate Contact Call-A-AH

## 2024-02-23 ENCOUNTER — Ambulatory Visit: Admitting: Family

## 2024-02-23 ENCOUNTER — Encounter: Payer: Self-pay | Admitting: Family

## 2024-02-23 ENCOUNTER — Ambulatory Visit
Admission: RE | Admit: 2024-02-23 | Discharge: 2024-02-23 | Disposition: A | Discharge status: Home / Self Care | Source: Ambulatory Visit | Attending: Physical Medicine and Rehabilitation | Admitting: Physical Medicine and Rehabilitation

## 2024-02-23 VITALS — BP 118/66 | HR 75 | Temp 97.9°F | Resp 19 | Ht 59.0 in | Wt 173.0 lb

## 2024-02-23 DIAGNOSIS — R7303 Prediabetes: Secondary | ICD-10-CM

## 2024-02-23 DIAGNOSIS — R413 Other amnesia: Secondary | ICD-10-CM

## 2024-02-23 DIAGNOSIS — I10 Essential (primary) hypertension: Secondary | ICD-10-CM | POA: Diagnosis not present

## 2024-02-23 DIAGNOSIS — R441 Visual hallucinations: Secondary | ICD-10-CM

## 2024-02-23 DIAGNOSIS — M47816 Spondylosis without myelopathy or radiculopathy, lumbar region: Secondary | ICD-10-CM

## 2024-02-23 DIAGNOSIS — E782 Mixed hyperlipidemia: Secondary | ICD-10-CM

## 2024-02-23 DIAGNOSIS — M47819 Spondylosis without myelopathy or radiculopathy, site unspecified: Secondary | ICD-10-CM

## 2024-02-23 DIAGNOSIS — R0981 Nasal congestion: Secondary | ICD-10-CM

## 2024-02-23 DIAGNOSIS — Z8673 Personal history of transient ischemic attack (TIA), and cerebral infarction without residual deficits: Secondary | ICD-10-CM

## 2024-02-23 DIAGNOSIS — G8929 Other chronic pain: Secondary | ICD-10-CM

## 2024-02-23 LAB — POCT URINALYSIS DIPSTICK OB
Bilirubin, UA: NEGATIVE
Blood, UA: NEGATIVE
Glucose, UA: NEGATIVE
Ketones, UA: NEGATIVE
Leukocytes, UA: NEGATIVE
Nitrite, UA: NEGATIVE
POC,PROTEIN,UA: NEGATIVE
Spec Grav, UA: 1.02 (ref 1.010–1.025)
Urobilinogen, UA: 0.2 U/dL
pH, UA: 6 (ref 5.0–8.0)

## 2024-02-24 LAB — URINE CULTURE
MICRO NUMBER:: 17232479
Result:: NO GROWTH
SPECIMEN QUALITY:: ADEQUATE

## 2024-02-27 ENCOUNTER — Ambulatory Visit: Payer: Self-pay

## 2024-02-27 ENCOUNTER — Ambulatory Visit: Payer: Self-pay | Admitting: Family

## 2024-03-01 ENCOUNTER — Encounter: Payer: Self-pay | Admitting: Physical Medicine and Rehabilitation

## 2024-03-01 ENCOUNTER — Other Ambulatory Visit

## 2024-03-01 ENCOUNTER — Ambulatory Visit: Admitting: Physical Medicine and Rehabilitation

## 2024-03-01 DIAGNOSIS — M5416 Radiculopathy, lumbar region: Secondary | ICD-10-CM | POA: Diagnosis not present

## 2024-03-01 DIAGNOSIS — M48062 Spinal stenosis, lumbar region with neurogenic claudication: Secondary | ICD-10-CM | POA: Diagnosis not present

## 2024-03-01 NOTE — Progress Notes (Signed)
 Pain Scale   Average Pain 6 Patient advising she has chronic lower back pain that is constant. Patient is here for MRI review.        +Driver, -BT, -Dye Allergies.

## 2024-03-01 NOTE — Progress Notes (Signed)
 Michelle Carrillo - 73 y.o. female MRN [id number]  Date of birth: [birth year]  Office Visit Note: Visit Date: 03/01/2024 PCP: Leonarda Roxan BROCKS, NP Referred by: Leonarda Roxan BROCKS, NP  Subjective: Chief Complaint  Patient presents with   Lower Back - Pain, Follow-up   HPI: Michelle Carrillo is a 73 y.o. female who comes in today for evaluation of chronic, worsening and severe bilateral lower back pain. Spanish interpreter at bedside. Son in law at bedside today. She is here today for lumbar MRI review. Pain ongoing for several years. Her pain worsens with movement and activity. States she is unable to stand and walk for prolonged periods of time. She describes pain as sore and aching sensation, currently rates as 6 out of 10. Some relief of pain with home exercise regimen, rest and use of medications. Recent lumbar MRI imaging shows moderate multifactorial spinal stenosis at L4-L5 with moderate lateral recess and mild foraminal narrowing bilaterally. There is moderate to severe right foraminal narrowing and possible right L5 nerve root impingement. Patient denies focal weakness, numbness and tingling. No recent trauma or falls.   History of stroke in 2022, daughter reports visual issues and expressive aphasia post stroke. She has upcoming appointment with neurology.      Review of Systems  Musculoskeletal:  Positive for back pain.  Neurological:  Negative for tingling, sensory change, focal weakness and weakness.  All other systems reviewed and are negative.  Otherwise per HPI.  Assessment & Plan: Visit Diagnoses:    ICD-10-CM   1. Lumbar radiculopathy  M54.16     2. Spinal stenosis of lumbar region with neurogenic claudication  M48.062        Plan: Findings:  Chronic, worsening and severe bilateral lower back pain. Patient continues to have severe pain despite good conservative therapies such as home exercise regimen, rest and use of medications. I discussed recent lumbar  MRI with her today using imaging and spine model. There is moderate spinal canal stenosis at L4-L5, also lateral recess and foraminal narrowing at this level. We discussed treatment plan in detail today. Next step is to perform diagnostic and hopefully therapeutic L4-L5 interlaminar epidural steroid injection under fluoroscopic guidance. She is not currently taking anticoagulant medications. I discussed injection procedure in detail today, she has no questions at this time. If good relief of pain with injection we can repeat this procedure infrequently as needed. Her exam today is non focal, good strength noted to bilateral lower extremities. We will see her back for injection.    Meds & Orders: No orders of the defined types were placed in this encounter.  No orders of the defined types were placed in this encounter.   Follow-up: Return for L4-L5 interlaminar epidural steroid injection.   Procedures: No procedures performed      Clinical History: Narrative & Impression CLINICAL DATA:  Persistent low back pain for years. No acute injury or prior relevant surgery.   EXAM: MRI LUMBAR SPINE WITHOUT CONTRAST   TECHNIQUE: Multiplanar, multisequence MR imaging of the lumbar spine was performed. No intravenous contrast was administered.   COMPARISON:  Lumbar spine radiographs 08/03/2022   FINDINGS: Segmentation: Conventional anatomy assumed, with the last open disc space designated L5-S1.Concordant with prior radiographs.   Alignment: Mild convex right scoliosis. Minimal degenerative anterolisthesis at L5-S1.   Vertebrae: No worrisome osseous lesion, acute fracture or pars defect. Scattered hemangiomas, largest at L3. There are Modic 2 endplate degenerative changes at L4-5.   Conus medullaris: Extends  to the L1-2 level. The conus and cauda equina appear normal.   Paraspinal and other soft tissues: No significant paraspinal findings. Mild extrahepatic biliary dilatation noted.    Disc levels:   Sagittal images demonstrate no significant disc space findings within the visualized lower thoracic spine.   L1-2: Mild loss of disc height with annular disc bulging and endplate osteophytes asymmetric to the right. No spinal stenosis. Mild right foraminal narrowing without definite L1 nerve root impingement.   L2-3: Loss of disc height with annular disc bulging and endplate osteophytes asymmetric to the left. Mild facet and ligamentous hypertrophy present no significant spinal stenosis. There is mild asymmetric left lateral recess and left foraminal narrowing.   L3-4: Loss of disc height with annular disc bulging and endplate osteophytes asymmetric to the left. Small left paracentral disc protrusion. Mild to moderate facet and ligamentous hypertrophy. Resulting mild spinal stenosis with mild asymmetric narrowing of the left lateral recess and left foramen.   L4-5: Loss of disc height with annular disc bulging and endplate osteophytes. Mild-to-moderate facet and ligamentous hypertrophy. Resulting moderate multifactorial spinal stenosis with moderate lateral recess and mild foraminal narrowing bilaterally.   L5-S1: Loss of disc height with annular disc bulging and endplate osteophytes asymmetric to the right. Moderate facet hypertrophy, greater on the right. Resulting mild spinal stenosis with moderate to severe right foraminal narrowing and possible right L5 nerve root impingement. The left foramen appears only mildly narrowed.   IMPRESSION: 1. Multilevel spondylosis associated with a mild convex right scoliosis. 2. At L4-5, there is moderate multifactorial spinal stenosis with moderate lateral recess and mild foraminal narrowing bilaterally. 3. At L5-S1, there is mild spinal stenosis with moderate to severe right foraminal narrowing and possible right L5 nerve root impingement. 4. At L3-4, there is mild spinal stenosis with mild asymmetric narrowing of the  left lateral recess and left foramen. 5. No acute findings.     Electronically Signed   By: Elsie Perone M.D.   On: 02/26/2024 11:48   She reports that she has never smoked. She has never used smokeless tobacco. No results for input(s): HGBA1C, LABURIC in the last 8760 hours.  Objective:  VS:  HT:    WT:   BMI:     BP:   HR: bpm  TEMP: ( )  RESP:  Physical Exam Vitals and nursing note reviewed.  HENT:     Head: Normocephalic and atraumatic.     Right Ear: External ear normal.     Left Ear: External ear normal.     Nose: Nose normal.     Mouth/Throat:     Mouth: Mucous membranes are moist.  Eyes:     Extraocular Movements: Extraocular movements intact.  Cardiovascular:     Rate and Rhythm: Normal rate.     Pulses: Normal pulses.  Pulmonary:     Effort: Pulmonary effort is normal.  Abdominal:     General: Abdomen is flat. There is no distension.  Musculoskeletal:        General: Tenderness present.     Cervical back: Normal range of motion.     Comments: Patient is slow to rise from seated position to standing. Pain noted with facet loading and lumbar extension. 5/5 strength noted with bilateral hip flexion, knee flexion/extension, ankle dorsiflexion/plantarflexion and EHL. No clonus noted bilaterally. No pain upon palpation of greater trochanters. No pain with internal/external rotation of bilateral hips. Sensation intact bilaterally. Negative slump test bilaterally. Ambulates without aid, gait steady.  Skin:    General: Skin is warm and dry.     Capillary Refill: Capillary refill takes less than 2 seconds.  Neurological:     General: No focal deficit present.     Mental Status: She is alert and oriented to person, place, and time.  Psychiatric:        Mood and Affect: Mood normal.        Behavior: Behavior normal.     Ortho Exam  Imaging: No results found.  Past Medical/Family/Surgical/Social History: Medications & Allergies reviewed per EMR, new  medications updated. 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 Left medial rectus recession 6.12mm to11.48mm from limbus 2. Left medial rectus adjustable suture 3. Left inferior rectus 5.50mm central tenotomy 4. Left inferior rectus adjustable suture  Last Assessment & Plan:  03/31/2019 1. Left medial rectus recession 6.41mm to11.84mm from limbus 2. Left medial rectus adjustable suture 3. Left inferior rectus 5.22mm central tenotomy 4. Left inferior r   Hypertension    on meds   Right knee osteoarthritis    Status post total right knee replacement 10/10/2020   Strabismus    due to eye injury as a child   Stroke [facility]) 07/2019   stroke with craniotomy   Family History  Problem Relation Age of Onset   Diabetes Mother    Ovarian cancer Mother    High blood pressure Sister    Diabetes Sister     Diabetes Brother    Stomach cancer Maternal Grandmother 34   Colon polyps Neg Hx    Colon cancer Neg Hx    Rectal cancer Neg Hx    Esophageal cancer Neg Hx    Past Surgical History:  Procedure Laterality Date   CESAREAN SECTION      x 2    CHOLECYSTECTOMY     COLONOSCOPY  04/22/2020   CORNEAL TRANSPLANT Left    2006 and 2016   CRANIOTOMY Left 08/09/2019   Procedure: CRANIOTOMY HEMATOMA EVACUATION SUBDURAL;  Surgeon: Gillie Duncans, MD;  Location: [facility] OR;  Service: Neurosurgery;  Laterality: Left;  left   EYE SURGERY     TOTAL KNEE ARTHROPLASTY Right 10/10/2020   Procedure: RIGHT TOTAL KNEE ARTHROPLASTY;  Surgeon: Jerri Kay HERO, MD;  Location: [facility] OR;  Service: Orthopedics;  Laterality: Right;   TUBAL LIGATION  Social History   Occupational History   Not on file  Tobacco Use   Smoking status: Never   Smokeless tobacco: Never  Vaping Use   Vaping status: Never Used  Substance and Sexual Activity   Alcohol use: Never   Drug use: Never   Sexual activity: Not Currently

## 2024-03-02 ENCOUNTER — Other Ambulatory Visit: Payer: Self-pay

## 2024-03-02 ENCOUNTER — Ambulatory Visit: Payer: Self-pay | Admitting: Family

## 2024-03-02 DIAGNOSIS — R7989 Other specified abnormal findings of blood chemistry: Secondary | ICD-10-CM

## 2024-03-02 LAB — TSH: TSH: 3.55 m[IU]/L (ref 0.40–4.50)

## 2024-03-04 DIAGNOSIS — R413 Other amnesia: Secondary | ICD-10-CM | POA: Insufficient documentation

## 2024-03-04 NOTE — Progress Notes (Signed)
 Provider: Roxan Plough FNP-C  Rowyn Mustapha, Roxan BROCKS, NP  Patient Care Team: Desiree Fleming, Roxan BROCKS, NP as PCP - General (Family Medicine)  Extended Emergency Contact Information Primary Emergency Contact: Jabier Eda [place], [place] [address] [place] Phone: [phone] Relation: Daughter Secondary Emergency Contact: Jackson Dama Address: [address]          [place], [place] [address] [place] Home Phone: [phone] Mobile Phone: [phone] Relation: Spouse  Code Status: Full code Goals of care: Advanced Directive information    12/27/2023   11:48 AM  Advanced Directives  Does Patient Have a Medical Advance Directive? No  Would patient like information on creating a medical advance directive? No - Patient declined     Chief Complaint  Patient presents with   Hallucinations    Discussed the use of AI scribe software for clinical note transcription with the patient, who gave verbal consent to proceed.  History of Present Illness   Michelle Carrillo is a 73 year old female with a history of nontraumatic cortical hemorrhage who presents with memory loss and hallucinations.  She has experienced three episodes of hallucinations, with the most recent involving a belief that two men, including the president, were in her house. This was actually a video on her phone, but she was convinced it was real and interacted with the figures. The latest episode was the most convincing to her.  She feels forgetful, often misplacing items and sometimes performing inappropriate actions, such as placing a cup on the stove. Her daughter notes that she sometimes seems fine but at other times is 'a little off.'  She has been feeling unwell for the past few days, with symptoms including a sensation of clogged ears and general malaise. No frequent urination, ear or nose pain, or significant depression.  Her current medications include  amlodipine  10 mg daily, atorvastatin  10 mg, lisinopril  20 mg, Paxil  20 mg daily, and she has completed a course of Flagyl  and a steroid. There is a concern about the dosage of amlodipine , as she was previously on 5 mg in [place].  She was in [place] for six months with her husband and reportedly did well there, although she had some bad days with similar symptoms of ear clogging and feeling out of sorts. Her daughter is concerned about her safety, especially with incidents like placing a cup on the stove.   Past Medical History:  Diagnosis Date   Anxiety    on meds   Arthritis    bilateral knees   Cataract    RIGHT eye   Depression    on meds   Healthcare maintenance 06/20/2019   Herpes zoster kerstitis s/p corneal transplant 2015    History of strabismus surgery 03/31/2019   03/31/2019 1. Left medial rectus recession 6.40mm to11.35mm from limbus 2. Left medial rectus adjustable suture 3. Left inferior rectus 5.24mm central tenotomy 4. Left inferior rectus adjustable suture  Last Assessment & Plan:  03/31/2019 1. Left medial rectus recession 6.18mm to11.48mm from limbus 2. Left medial rectus adjustable suture 3. Left inferior rectus 5.71mm central tenotomy 4. Left inferior r   Hypertension    on meds   Right knee osteoarthritis    Status post total right knee replacement 10/10/2020   Strabismus    due to eye injury as a child   Stroke [facility]) 07/2019   stroke with craniotomy   Past Surgical History:  Procedure Laterality  Date   CESAREAN SECTION      x 2    CHOLECYSTECTOMY     COLONOSCOPY  04/22/2020   CORNEAL TRANSPLANT Left    2006 and 2016   CRANIOTOMY Left 08/09/2019   Procedure: CRANIOTOMY HEMATOMA EVACUATION SUBDURAL;  Surgeon: Gillie Duncans, MD;  Location: [facility] OR;  Service: Neurosurgery;  Laterality: Left;  left   EYE SURGERY     TOTAL KNEE ARTHROPLASTY Right 10/10/2020   Procedure: RIGHT TOTAL KNEE ARTHROPLASTY;  Surgeon: Jerri Kay HERO, MD;  Location: [facility] OR;  Service: Orthopedics;   Laterality: Right;   TUBAL LIGATION      No Known Allergies  Outpatient Encounter Medications as of 02/23/2024  Medication Sig   acetaminophen  (TYLENOL ) 500 MG tablet Take 1 tablet (500 mg total) by mouth every 6 (six) hours as needed.   amLODipine  (NORVASC ) 10 MG tablet Take 1 tablet (10 mg total) by mouth daily. APPOINTMENT DUE PRIOR TO ADDITIONAL REFILLS   aspirin  EC 81 MG tablet Take 1 tablet (81 mg total) by mouth daily. To be taken after surgery (Patient taking differently: Take 81 mg by mouth as needed. To be taken after surgery)   atorvastatin  (LIPITOR) 10 MG tablet TAKE 1 TABLET AT BEDTIME. Needs an appointment before anymore future refills.   lisinopril  (ZESTRIL ) 20 MG tablet Take 1 tablet (20 mg total) by mouth daily. APPOINTMENT DUE PRIOR TO ADDITIONAL REFILLS   PARoxetine  (PAXIL ) 20 MG tablet Take 1 tablet (20 mg total) by mouth daily.   [DISCONTINUED] metroNIDAZOLE  (FLAGYL ) 500 MG tablet Take 1 tablet (500 mg total) by mouth 2 (two) times daily. (Patient not taking: Reported on 02/23/2024)   [DISCONTINUED] predniSONE  (STERAPRED UNI-PAK 21 TAB) 10 MG (21) TBPK tablet Take as directed (Patient not taking: Reported on 02/23/2024)   No facility-administered encounter medications on file as of 02/23/2024.    Review of Systems  Constitutional:  Positive for fatigue. Negative for appetite change, chills, fever and unexpected weight change.  HENT:  Negative for congestion, dental problem, ear discharge, ear pain, hearing loss, nosebleeds, postnasal drip, rhinorrhea, sinus pressure, sinus pain, sneezing, sore throat, tinnitus and trouble swallowing.        Feelings of clogged ears  Eyes:  Negative for pain, discharge, redness, itching and visual disturbance.  Respiratory:  Negative for cough, chest tightness, shortness of breath and wheezing.   Cardiovascular:  Negative for chest pain, palpitations and leg swelling.  Gastrointestinal:  Negative for abdominal distention, abdominal  pain, blood in stool, constipation, diarrhea, nausea and vomiting.  Genitourinary:  Negative for difficulty urinating, dysuria, flank pain, frequency and urgency.  Musculoskeletal:  Negative for arthralgias, back pain, gait problem, joint swelling, myalgias, neck pain and neck stiffness.  Skin:  Negative for color change, pallor, rash and wound.  Neurological:  Negative for dizziness, weakness, light-headedness, numbness and headaches.  Hematological:  Does not bruise/bleed easily.  Psychiatric/Behavioral:  Positive for hallucinations. Negative for agitation, behavioral problems, confusion, self-injury, sleep disturbance and suicidal ideas. The patient is not nervous/anxious.     Immunization History  Administered Date(s) Administered   INFLUENZA, HIGH DOSE SEASONAL PF 12/27/2023   Influenza,inj,Quad PF,6+ Mos 01/09/2020   Moderna Sars-Covid-2 Vaccination 06/14/2019, 07/17/2019   PNEUMOCOCCAL CONJUGATE-20 07/01/2022   Pneumococcal Conjugate-13 04/23/2020   Pertinent  Health Maintenance Due  Topic Date Due   Colonoscopy  04/23/2023   Mammogram  01/03/2026   Influenza Vaccine  Completed   Bone Density Scan  Completed      07/23/2021  2:35 PM 01/04/2022    2:26 PM 07/01/2022   11:28 AM 08/19/2022    3:19 PM 12/27/2023   11:47 AM  Fall Risk  Falls in the past year? 0 0 0 0 0  Was there an injury with Fall? 0 0 0 0 0  Fall Risk Category Calculator 0 0 0 0 0  Fall Risk Category (Retired) Low  Low      (RETIRED) Patient Fall Risk Level Low fall risk  Low fall risk      Patient at Risk for Falls Due to No Fall Risks No Fall Risks  No Fall Risks No Fall Risks  Fall risk Follow up Falls evaluation completed;Falls prevention discussed  Falls evaluation completed   Falls evaluation completed Falls evaluation completed     Data saved with a previous flowsheet row definition   Functional Status Survey:    Vitals:   02/23/24 1326  BP: 118/66  Pulse: 75  Resp: 19  Temp: 97.9 F (36.6  C)  SpO2: 95%  Weight: 173 lb (78.5 kg)  Height: 4' 11 (1.499 m)   Body mass index is 34.94 kg/m. Physical Exam  VITALS: T- 97.9, P- 75, BP- 118/66, SaO2- 95% MEASUREMENTS: Weight- 173. GENERAL: Alert, cooperative, well developed, no acute distress. HEENT: Normocephalic, normal oropharynx, moist mucous membranes, right nasal passage congested with swollen mucosa, ears normal with clear tympanic membranes. NECK: Neck supple, no nuchal rigidity. thyroid  swollen CHEST: Clear to auscultation bilaterally, no wheezes, rhonchi, or crackles. CARDIOVASCULAR: Normal heart rate and rhythm, S1 and S2 normal without murmurs. ABDOMEN: Soft, non-tender, non-distended, without organomegaly, normal bowel sounds. EXTREMITIES: No cyanosis or edema. NEUROLOGICAL: Cranial nerves grossly intact, moves all extremities without gross motor or sensory deficit.  SKIN: No rash,no lesion or erythema   PSYCHIATRY/BEHAVIORAL: Mood stable   Labs reviewed: Recent Labs    01/04/24 0912  NA 141  K 4.3  CL 106  CO2 27  GLUCOSE 99  BUN 15  CREATININE 0.59*  CALCIUM  8.9   Recent Labs    01/04/24 0912  AST 19  ALT 13  BILITOT 0.5  PROT 7.2   Recent Labs    01/04/24 0912  WBC 7.1  NEUTROABS 4,210  HGB 13.5  HCT 40.3  MCV 92.9  PLT 277   Lab Results  Component Value Date   TSH 3.55 03/01/2024   Lab Results  Component Value Date   HGBA1C 6.0 (H) 07/01/2022   Lab Results  Component Value Date   CHOL 119 01/04/2024   HDL 57 01/04/2024   LDLCALC 45 01/04/2024   TRIG 89 01/04/2024   CHOLHDL 2.1 01/04/2024    Significant Diagnostic Results in last 30 days:  MR LUMBAR SPINE WO CONTRAST Result Date: 02/26/2024 CLINICAL DATA:  Persistent low back pain for years. No acute injury or prior relevant surgery. EXAM: MRI LUMBAR SPINE WITHOUT CONTRAST TECHNIQUE: Multiplanar, multisequence MR imaging of the lumbar spine was performed. No intravenous contrast was administered. COMPARISON:  Lumbar spine  radiographs 08/03/2022 FINDINGS: Segmentation: Conventional anatomy assumed, with the last open disc space designated L5-S1.Concordant with prior radiographs. Alignment: Mild convex right scoliosis. Minimal degenerative anterolisthesis at L5-S1. Vertebrae: No worrisome osseous lesion, acute fracture or pars defect. Scattered hemangiomas, largest at L3. There are Modic 2 endplate degenerative changes at L4-5. Conus medullaris: Extends to the L1-2 level. The conus and cauda equina appear normal. Paraspinal and other soft tissues: No significant paraspinal findings. Mild extrahepatic biliary dilatation noted. Disc levels: Sagittal images demonstrate no  significant disc space findings within the visualized lower thoracic spine. L1-2: Mild loss of disc height with annular disc bulging and endplate osteophytes asymmetric to the right. No spinal stenosis. Mild right foraminal narrowing without definite L1 nerve root impingement. L2-3: Loss of disc height with annular disc bulging and endplate osteophytes asymmetric to the left. Mild facet and ligamentous hypertrophy present no significant spinal stenosis. There is mild asymmetric left lateral recess and left foraminal narrowing. L3-4: Loss of disc height with annular disc bulging and endplate osteophytes asymmetric to the left. Small left paracentral disc protrusion. Mild to moderate facet and ligamentous hypertrophy. Resulting mild spinal stenosis with mild asymmetric narrowing of the left lateral recess and left foramen. L4-5: Loss of disc height with annular disc bulging and endplate osteophytes. Mild-to-moderate facet and ligamentous hypertrophy. Resulting moderate multifactorial spinal stenosis with moderate lateral recess and mild foraminal narrowing bilaterally. L5-S1: Loss of disc height with annular disc bulging and endplate osteophytes asymmetric to the right. Moderate facet hypertrophy, greater on the right. Resulting mild spinal stenosis with moderate to severe  right foraminal narrowing and possible right L5 nerve root impingement. The left foramen appears only mildly narrowed. IMPRESSION: 1. Multilevel spondylosis associated with a mild convex right scoliosis. 2. At L4-5, there is moderate multifactorial spinal stenosis with moderate lateral recess and mild foraminal narrowing bilaterally. 3. At L5-S1, there is mild spinal stenosis with moderate to severe right foraminal narrowing and possible right L5 nerve root impingement. 4. At L3-4, there is mild spinal stenosis with mild asymmetric narrowing of the left lateral recess and left foramen. 5. No acute findings. Electronically Signed   By: Elsie Perone M.D.   On: 02/26/2024 11:48    Assessment/Plan  Memory loss and visual hallucinations with possible dementia Intermittent memory loss and visual hallucinations, with a recent episode of believing people were in the house when she was watching a video on her phone. Differential diagnosis includes dementia, Alzheimer's disease, or urinary tract infection. MRI is preferred for diagnosing dementia. Discussed that while there is no cure for dementia, medications can sometimes prevent worsening. Emphasized the importance of safety due to memory loss and potential wandering. - Ordered MRI of the brain with or without contrast - Obtained urine specimen to rule out urinary tract infection  History of left-sided nontraumatic cortical hemorrhage (stroke) Nontraumatic cortical hemorrhage on the left side of the brain, which may contribute to vascular dementia. Blood pressure management is crucial to prevent further strokes. - Continue current antihypertensive regimen  Nasal congestion and clogged ears sensation Nasal congestion, particularly on the right side, causing a sensation of clogged ears. Examination revealed congestion and swelling in the right nasal passage. - Prescribed Flonase nasal spray, one spray in each nostril daily  Hypertension Blood pressure  is well-controlled on current regimen of amlodipine  and lisinopril . Discussed the importance of maintaining blood pressure control, especially given her history of stroke. - Continue current antihypertensive regimen - Monitor blood pressure at home weekly  Prediabetes Due for routine blood work to monitor prediabetes status. - Scheduled blood work for prediabetes monitoring  Hyperlipidemia Currently managed with atorvastatin . - Continue atorvastatin  10 mg daily  Thyroid  disorder under evaluation Thyroid  function is being monitored with upcoming blood work scheduled for November 21st. Previous thyroid  levels were slightly elevated. - Will perform thyroid  function tests on November 21st  Depressive symptoms Intermittent depressive symptoms, possibly related to awareness of cognitive decline. Discussed the emotional impact of early-stage dementia. - Continue to monitor mood and depressive  symptoms   Family/ staff Communication: Reviewed plan of care with patient 's daughter verbalized understanding   Labs/tests ordered: None   Next Appointment: Return if symptoms worsen or fail to improve.   Total time: minutes. Greater than 50% of total time spent doing patient education regarding Memory loss and visual hallucinations with possible dementia,HTN, HLD,Prediabetes,Nasal congestion and clogged ears sensation,Thyroid  disorder under evaluation,History of left-sided nontraumatic cortical hemorrhage,health maintenance including symptom/medication management.   Roxan JAYSON Plough, NP

## 2024-03-05 ENCOUNTER — Encounter: Payer: Self-pay | Admitting: Neurology

## 2024-03-05 ENCOUNTER — Telehealth: Payer: Self-pay | Admitting: Neurology

## 2024-03-05 ENCOUNTER — Ambulatory Visit: Admitting: Neurology

## 2024-03-05 VITALS — BP 131/69 | HR 72 | Ht 59.0 in | Wt 172.0 lb

## 2024-03-05 DIAGNOSIS — F0150 Vascular dementia without behavioral disturbance: Secondary | ICD-10-CM | POA: Diagnosis not present

## 2024-03-05 DIAGNOSIS — Z8673 Personal history of transient ischemic attack (TIA), and cerebral infarction without residual deficits: Secondary | ICD-10-CM

## 2024-03-05 DIAGNOSIS — R413 Other amnesia: Secondary | ICD-10-CM | POA: Diagnosis not present

## 2024-03-05 DIAGNOSIS — G309 Alzheimer's disease, unspecified: Secondary | ICD-10-CM

## 2024-03-05 DIAGNOSIS — F0280 Dementia in other diseases classified elsewhere without behavioral disturbance: Secondary | ICD-10-CM

## 2024-03-05 MED ORDER — MEMANTINE HCL 10 MG PO TABS: 10.0000 mg | ORAL_TABLET | Freq: Two times a day (BID) | ORAL | 3 refills | Status: DC

## 2024-03-05 NOTE — Patient Instructions (Addendum)
 I had a long discussion with the patient and her daughter using Spanish language interpreter about her health subacute memory loss and cognitive decline which likely represents mixed dementia given her prior history of large intracerebral hemorrhage.  I recommend further evaluation checking MRI scan of the brain for structural lesions as well as EEG and memory panel labs.  Trial of Namenda  5 mg at night for 1 week and then twice daily for a month and increase if tolerated 10 mg twice daily.  I also encouraged her to increase participation in cognitively challenging activities like solving crossword puzzles, playing bridge and sudoku.  We discussed memory compensation strategies.  Continue aspirin  for stroke prevention and maintain aggressive risk factor modification with strict control of hypertension and blood pressure goal below 140/90, lipids with LDL cholesterol goal below 70 mg percent and diabetes with hemoglobin A1c goal below 6.5%.  Return for follow-up in the future in 3 months with nurse practitioner Harlene or call earlier if necessary.  Memory Compensation Strategies  Use WARM strategy.  W= write it down  A= associate it  R= repeat it  M= make a mental note  2.   You can keep a Glass Blower/designer.  Use a 3-ring notebook with sections for the following: calendar, important names and phone numbers,  medications, doctors' names/phone numbers, lists/reminders, and a section to journal what you did  each day.   3.    Use a calendar to write appointments down.  4.    Write yourself a schedule for the day.  This can be placed on the calendar or in a separate section of the Memory Notebook.  Keeping a  regular schedule can help memory.  5.    Use medication organizer with sections for each day or morning/evening pills.  You may need help loading it  6.    Keep a basket, or pegboard by the door.  Place items that you need to take out with you in the basket or on the pegboard.  You may also  want to  include a message board for reminders.  7.    Use sticky notes.  Place sticky notes with reminders in a place where the task is performed.  For example:  turn off the  stove placed by the stove, lock the door placed on the door at eye level,  take your medications on  the bathroom mirror or by the place where you normally take your medications.  8.    Use alarms/timers.  Use while cooking to remind yourself to check on food or as a reminder to take your medicine, or as a  reminder to make a call, or as a reminder to perform another task, etc.

## 2024-03-05 NOTE — Progress Notes (Signed)
 [facility] [address] [place]. [place] [address] [phone]       OFFICE CONSULT NOTE  [name] Date of Birth:  [birth year] Medical Record Number:  [id number]   Referring MD:  Michelle Carrillo  Reason for Referral : memory loss  HPI: [name] is a pleasant 73 year old Hispanic lady seen today for office consultation visit.  She is accompanied by her daughter as well as Spanish language interpreter was present throughout the visit.  History is obtained from them and review of electronic medical records and I have personally reviewed pertinent available imaging films in PACS.  Patient has past medical history of arthritis, knee replacement, hypertension, anxiety, depression and left parietal parenchymal intracerebral hemorrhage in April 2021 s/p craniotomy and history of TIA in April 2022.  Patient had recovered reasonably well from these and had only mild residual speech difficulties.  Over the last 2 to 3 months the daughter has noticed cognitive decline.  She does get confused and at times disoriented.  She also has hallucination and at times thinks there are people are not there.  She has trouble understanding videos and characters.  Patient had gone to [place] and lived there for a year with her husband until 4 months ago.  Since she is coming here the daughter has noticed these changes.  She denies any headaches, extremity weakness, gait or balance difficulties.  She has not demonstrated any violent behavior but does sometimes describes people who were not there.  She does have still occasional word finding difficulties and her speech is not as fluent but it is not progressively getting worse.  She denies any headache, fall, head injury or gait or balance problems.  She has not had any recent brain imaging studies done.  On Mini-Mental status testing today she scored 17/30 and on the depression scale she scored only 4 which is not suggestive  of significant depression.  There is no family history of Alzheimer's. Prior office visit 01/19/2021 Michelle Raisin, Carrillo ) returns for 61-month stroke follow-up accompanied by her daughter, Michelle Carrillo, who assists with interpretation.  Overall stable from stroke standpoint.  Residual right peripheral visual impairment stable without worsening.  Evaluated by neuro-ophthalmology per daughter request last month -confirmed right homonymous hemianopia and no treatment options available. She has not yet been seen by her primary ophthalmologist. Continues to struggle with depression and lack of motivation. No additional aphasia episodes - EEG not yet completed.  Carotid duplex unremarkable.  Denies new stroke/TIA symptoms. Remains on aspirin  and atorvastatin  without side effects.  Blood pressure today 132/72. S/p R TKR 10/10/2020 by Dr. Jerri currently working with PT. She complains of multiple other areas of pain (elbow and shoulders) .  No further concerns at this time.    History provided for reference purposes only Update 07/17/2020 JM: Pt returns for scheduled 13-month stroke follow-up accompanied by her daughter, Michelle Carrillo, who is a [facility] interpreter    She reports continued visual impairment, being clumsy and cognitive impairment with some improvement since prior visit Recently working with OT/SLP but daughter reports this has been completed Since stopping, patient does not do any of the exercises as daughter reports she is very unmotivated Episode on 07/13/2020 (evaluated in ED) consisted of word finding difficulty that lasted just a few seconds per patient but per ED notes, lasted approx 3 minutes.  No other associated symptoms such as weakness, numbness/tingling, confusion, altered mental status or loss of consciousness.  Neuro consult who felt  possibly more related to seizure activity especially with history of ICH. MRI negative for acute infarct.  Recommended EEG outpatient.  No additional events since that time.    Previously on atorvastatin  but per daughter, PCP recommended discontinuing and recommended diet modification  Blood pressure 121/68 -routinely monitored at home which has been stable   No further concerns at this time   Update 01/14/2020 JM: Ms. [name] returns for 19-month stroke follow-up accompanied by her daughter who assists with interpretation. Does report continued cognitive impairment such as difficulty concentrating and short-term memory loss and occasional word finding difficulty. Completed HH SLP - daughter questioning if additional outpatient therapy would be beneficial. She continues to live with her husband and 2 daughters. Maintains ADLs independently but does need assistance with IADLs such as bill paying and cooking.  Daughter reports visual concerns post stroke such as bumping into different objects currently being followed by ophthalmology with extensive ophthalmologic history.  Denies diplopia or blurred vision but possibly right peripheral impairment.  Denies new or worsening stroke/TIA symptoms.  Repeat CT head showed resolution of prior ICH.  EEG showed no evidence of seizure activity.  Keppra  discontinued after slow titration and denies seizure activity or symptoms.  Blood pressure today 132/68. Monitors at home which has been stable.  Continues to have issues with depression/anxiety currently being managed by PCP.  No further concerns at this time.   Initial visit 10/09/2019 Michelle Carrillo. Michelle Carrillo is a pleasant 73 year old Hispanic lady who is accompanied today by her daughter as well as Spanish language interpreter Michelle Carrillo who translates for her.  History is obtained from them, review of electronic medical records and I personally reviewed imaging films in PACS.  She has past medical history of hypertension, anxiety and depression who presented to [facility] emergency room on 08/09/2019 with altered mental status.  CT scan of the head showed a large moderate size  cortically based hematoma in the left parietal region with extension into the subdural space resulting in a fair sized subdural hematoma with left-to-right midline shift.  She had neurological worsening on exam requiring emergent neurosurgical consultation and patient underwent craniotomy for evacuation of the subdural hematoma.  She was kept in the intensive care unit and blood pressure was tightly controlled.  She was started on hypertonic saline.  Follow-up CT scan showed gradual improvement of her rightward midline shift from 7 mm down.  2D echo showed normal ejection fraction without cardiac source of embolism.  EEG shows no seizure activity but she was started on Keppra  for seizure prophylaxis.  Repeat CT on the day of discharge on 08/23/2019 showed reduction in midline shift from 11 to 8 mm.  Patient was found to have mild aphasia on exam but otherwise no focal deficits.  She was transferred to inpatient rehab to [facility] in [place] as there was no beds available at [facility] inpatient rehab.  Patient was there for 3 weeks and is presently living at home with her daughter.  Her speech is mostly improved though she has some word hesitancy and forgets occasional words.  She is mostly fluent when she speaks.  Her understanding is also improving though it is not back to normal.  The patient's blood pressure has been well controlled and today it is 140/76.  She complains of some intermittent dizziness as well as anxiety.  She complains of some tingling on her head on the surgical site.  This is intermittent and not very bothersome.  She is currently getting home speech  therapy.  She was started on Zoloft  25 mg by primary care physician recently but daughter feels it does not help and perhaps may have caused new side effects. ROS:   14 system review of systems is positive for memory loss, confusion, disorientation, hallucinations, speech difficulties all other systems negative  PMH:  Past Medical History:   Diagnosis Date   Anxiety    on meds   Arthritis    bilateral knees   Cataract    RIGHT eye   Depression    on meds   Healthcare maintenance 06/20/2019   Herpes zoster kerstitis s/p corneal transplant 2015    History of strabismus surgery 03/31/2019   03/31/2019 1. Left medial rectus recession 6.40mm to11.48mm from limbus 2. Left medial rectus adjustable suture 3. Left inferior rectus 5.101mm central tenotomy 4. Left inferior rectus adjustable suture  Last Assessment & Plan:  03/31/2019 1. Left medial rectus recession 6.7mm to11.58mm from limbus 2. Left medial rectus adjustable suture 3. Left inferior rectus 5.31mm central tenotomy 4. Left inferior r   Hypertension    on meds   Right knee osteoarthritis    Status post total right knee replacement 10/10/2020   Strabismus    due to eye injury as a child   Stroke [facility]) 07/2019   stroke with craniotomy    Social History:  Social History   Socioeconomic History   Marital status: Married    Spouse name: Not on file   Number of children: Not on file   Years of education: Not on file   Highest education level: 3rd grade  Occupational History   Not on file  Tobacco Use   Smoking status: Never   Smokeless tobacco: Never  Vaping Use   Vaping status: Never Used  Substance and Sexual Activity   Alcohol use: Never   Drug use: Never   Sexual activity: Not Currently  Other Topics Concern   Not on file  Social History Narrative   Not on file   Social Drivers of Health   Financial Resource Strain: Low Risk  (12/27/2023)   Overall Financial Resource Strain (CARDIA)    Difficulty of Paying Living Expenses: Not hard at all  Food Insecurity: No Food Insecurity (12/27/2023)   Hunger Vital Sign    Worried About Running Out of Food in the Last Year: Never true    Ran Out of Food in the Last Year: Never true  Transportation Needs: No Transportation Needs (12/27/2023)   PRAPARE - Administrator, Civil Service (Medical): No    Lack of  Transportation (Non-Medical): No  Recent Concern: Transportation Needs - Unmet Transportation Needs (12/27/2023)   PRAPARE - Transportation    Lack of Transportation (Medical): Yes    Lack of Transportation (Non-Medical): Yes  Physical Activity: Inactive (12/27/2023)   Exercise Vital Sign    Days of Exercise per Week: 0 days    Minutes of Exercise per Session: Not on file  Stress: No Stress Concern Present (12/27/2023)   [employer] of Occupational Health - Occupational Stress Questionnaire    Feeling of Stress: Only a little  Social Connections: Socially Integrated (12/27/2023)   Social Connection and Isolation Panel    Frequency of Communication with Friends and Family: More than three times a week    Frequency of Social Gatherings with Friends and Family: Twice a week    Attends Religious Services: More than 4 times per year    Active Member of [employer] or Organizations: Yes  Attends Banker Meetings: Not on file    Marital Status: Married  Intimate Partner Violence: Not At Risk (12/27/2023)   Humiliation, Afraid, Rape, and Kick questionnaire    Fear of Current or Ex-Partner: No    Emotionally Abused: No    Physically Abused: No    Sexually Abused: No    Medications:   Current Outpatient Medications on File Prior to Visit  Medication Sig Dispense Refill   acetaminophen  (TYLENOL ) 500 MG tablet Take 1 tablet (500 mg total) by mouth every 6 (six) hours as needed. 30 tablet 3   amLODipine  (NORVASC ) 10 MG tablet Take 1 tablet (10 mg total) by mouth daily. APPOINTMENT DUE PRIOR TO ADDITIONAL REFILLS 90 tablet 1   aspirin  EC 81 MG tablet Take 1 tablet (81 mg total) by mouth daily. To be taken after surgery (Patient taking differently: Take 81 mg by mouth as needed. To be taken after surgery) 30 tablet 2   atorvastatin  (LIPITOR) 10 MG tablet TAKE 1 TABLET AT BEDTIME. Needs an appointment before anymore future refills. 30 tablet 3   lisinopril  (ZESTRIL ) 20 MG tablet Take 1  tablet (20 mg total) by mouth daily. APPOINTMENT DUE PRIOR TO ADDITIONAL REFILLS 90 tablet 1   PARoxetine  (PAXIL ) 20 MG tablet Take 1 tablet (20 mg total) by mouth daily. (Patient not taking: Reported on 03/05/2024) 90 tablet 1   No current facility-administered medications on file prior to visit.    Allergies:  No Known Allergies  Physical Exam General: well developed, well nourished, seated, in no evident distress Head: head normocephalic and atraumatic.   Neck: supple with no carotid or supraclavicular bruits Cardiovascular: regular rate and rhythm, no murmurs Musculoskeletal: no deformity Skin:  no rash/petichiae Vascular:  Normal pulses all extremities  Neurologic Exam Mental Status: Awake and fully alert. Oriented to place and time. Recent and remote memory intact. Attention span, concentration and fund of knowledge appropriate. Mood and affect appropriate.  MMSE score 17/30.  With deficits in orientation, attention, recall and drawing and repetition.  Activities of daily living she required help for activities like shopping cooking and managing medications and transportation and finances and communication.  Geriatric depression score 4 not depressed.  Clock drawing 1/4.  Able to name only 6 animals which can walk on forelegs. Cranial Nerves: Fundoscopic exam reveals sharp disc margins. Pupils equal, briskly reactive to light. Extraocular movements full without nystagmus. Visual fields full to confrontation. Hearing intact. Facial sensation intact. Face, tongue, palate moves normally and symmetrically.  Motor: Normal bulk and tone. Normal strength in all tested extremity muscles. Sensory.: intact to touch , pinprick , position and vibratory sensation.  Coordination: Rapid alternating movements normal in all extremities. Finger-to-nose and heel-to-shin performed accurately bilaterally. Gait and Station: Arises from chair without difficulty. Stance is normal. Gait demonstrates normal  stride length and balance . Able to heel, toe and tandem walk without difficulty.  Reflexes: 1+ and symmetric. Toes downgoing.   NIHSS  4 Modified Rankin  3    03/05/2024   11:32 AM  MMSE - Mini Mental State Exam  Orientation to time 1  Orientation to Place 4  Registration 3  Attention/ Calculation 0  Recall 1  Language- name 2 objects 2  Language- repeat 1  Language- follow 3 step command 3  Language- read & follow direction 1  Write a sentence 1  Copy design 0  Total score 17      ASSESSMENT: 72 year old Hispanic lady with 3 to 32-month  history of cognitive decline likely due to mixed dementia with prior history of left parietal parenchymal hemorrhage in April 2021 likely of hypertensive etiology s/p craniotomy with mild residual cognitive impairment and speech difficulties as well as left hemispheric TIA in April 2022.     PLAN:I had a long discussion with the patient and her daughter using Spanish language interpreter about her  subacute memory loss and cognitive decline which likely represents mixed dementia given her prior history of large intracerebral hemorrhage.  I recommend further evaluation checking MRI scan of the brain for structural lesions as well as EEG and memory panel labs.  Trial of Namenda  5 mg at night for 1 week and then twice daily for a month and increase if tolerated 10 mg twice daily.  I also encouraged her to increase participation in cognitively challenging activities like solving crossword puzzles, playing bridge and sudoku.  We discussed memory compensation strategies.  Continue aspirin  for stroke prevention and maintain aggressive risk factor modification with strict control of hypertension and blood pressure goal below 140/90, lipids with LDL cholesterol goal below 70 mg percent and diabetes with hemoglobin A1c goal below 6.5%.  Return for follow-up in the future in 3 months with nurse practitioner Harlene or call earlier if necessary.   I personally  spent a total of 60 minutes in the care of the patient today including getting/reviewing separately obtained history, performing a medically appropriate exam/evaluation, counseling and educating, placing orders, referring and communicating with other health care professionals, documenting clinical information in the EHR, independently interpreting results, and coordinating care.     Eather Popp, MD   Note: This document was prepared with digital dictation and possible smart phrase technology. Any transcriptional errors that result from this process are unintentional.

## 2024-03-05 NOTE — Telephone Encounter (Signed)
 no auth required sent to GI [phone]

## 2024-03-07 ENCOUNTER — Ambulatory Visit: Payer: Self-pay | Admitting: Neurology

## 2024-03-07 NOTE — Progress Notes (Signed)
 Kindly inform the patient that lab work for reversible causes of memory loss was satisfactory.  One of the blood test is not back yet.

## 2024-03-08 LAB — DEMENTIA PANEL
Homocysteine: 9.3 umol/L (ref 0.0–19.2)
RPR Ser Ql: NONREACTIVE
TSH: 3.32 u[IU]/mL (ref 0.450–4.500)
Vitamin B-12: 1246 pg/mL — ABNORMAL HIGH (ref 232–1245)

## 2024-03-14 ENCOUNTER — Ambulatory Visit: Admitting: Neurology

## 2024-03-14 DIAGNOSIS — R413 Other amnesia: Secondary | ICD-10-CM

## 2024-03-14 DIAGNOSIS — R4182 Altered mental status, unspecified: Secondary | ICD-10-CM

## 2024-03-22 ENCOUNTER — Ambulatory Visit: Admitting: Physical Medicine and Rehabilitation

## 2024-03-29 ENCOUNTER — Emergency Department (HOSPITAL_COMMUNITY)

## 2024-03-29 ENCOUNTER — Other Ambulatory Visit: Payer: Self-pay

## 2024-03-29 ENCOUNTER — Inpatient Hospital Stay (HOSPITAL_COMMUNITY)
Admission: EM | Admit: 2024-03-29 | Discharge: 2024-04-01 | DRG: 871 | Disposition: A | Discharge status: Home / Self Care | Attending: Family Medicine | Admitting: Family Medicine

## 2024-03-29 DIAGNOSIS — E66811 Obesity, class 1: Secondary | ICD-10-CM | POA: Diagnosis present

## 2024-03-29 DIAGNOSIS — J9601 Acute respiratory failure with hypoxia: Secondary | ICD-10-CM | POA: Diagnosis present

## 2024-03-29 DIAGNOSIS — F32A Depression, unspecified: Secondary | ICD-10-CM | POA: Diagnosis present

## 2024-03-29 DIAGNOSIS — A419 Sepsis, unspecified organism: Principal | ICD-10-CM | POA: Diagnosis present

## 2024-03-29 DIAGNOSIS — Z792 Long term (current) use of antibiotics: Secondary | ICD-10-CM

## 2024-03-29 DIAGNOSIS — J189 Pneumonia, unspecified organism: Principal | ICD-10-CM | POA: Diagnosis present

## 2024-03-29 DIAGNOSIS — Z9049 Acquired absence of other specified parts of digestive tract: Secondary | ICD-10-CM

## 2024-03-29 DIAGNOSIS — I10 Essential (primary) hypertension: Secondary | ICD-10-CM | POA: Diagnosis present

## 2024-03-29 DIAGNOSIS — Z6834 Body mass index (BMI) 34.0-34.9, adult: Secondary | ICD-10-CM

## 2024-03-29 DIAGNOSIS — I6930 Unspecified sequelae of cerebral infarction: Secondary | ICD-10-CM

## 2024-03-29 DIAGNOSIS — Z1152 Encounter for screening for COVID-19: Secondary | ICD-10-CM

## 2024-03-29 DIAGNOSIS — F419 Anxiety disorder, unspecified: Secondary | ICD-10-CM | POA: Diagnosis present

## 2024-03-29 DIAGNOSIS — E785 Hyperlipidemia, unspecified: Secondary | ICD-10-CM | POA: Diagnosis present

## 2024-03-29 DIAGNOSIS — Z833 Family history of diabetes mellitus: Secondary | ICD-10-CM

## 2024-03-29 DIAGNOSIS — Z947 Corneal transplant status: Secondary | ICD-10-CM

## 2024-03-29 DIAGNOSIS — Z7982 Long term (current) use of aspirin: Secondary | ICD-10-CM

## 2024-03-29 DIAGNOSIS — R413 Other amnesia: Secondary | ICD-10-CM | POA: Diagnosis present

## 2024-03-29 DIAGNOSIS — Z9851 Tubal ligation status: Secondary | ICD-10-CM

## 2024-03-29 DIAGNOSIS — Z79899 Other long term (current) drug therapy: Secondary | ICD-10-CM

## 2024-03-29 DIAGNOSIS — R7303 Prediabetes: Secondary | ICD-10-CM | POA: Diagnosis present

## 2024-03-29 DIAGNOSIS — Z96651 Presence of right artificial knee joint: Secondary | ICD-10-CM | POA: Diagnosis present

## 2024-03-29 MED ORDER — ACETAMINOPHEN 325 MG PO TABS
650.0000 mg | ORAL_TABLET | Freq: Once | ORAL | Status: AC
  Administered 2024-03-29: 23:00:00 650 mg via ORAL
  Filled 2024-03-29: qty 2

## 2024-03-29 NOTE — ED Notes (Signed)
 O2 2 LPM/[place] applied.

## 2024-03-29 NOTE — ED Triage Notes (Signed)
 Patient reports chest congestion with dry cough and fever this week , states family member has the same symptoms , o2 2 lpm/[place] applied at triage .

## 2024-03-30 ENCOUNTER — Emergency Department (HOSPITAL_COMMUNITY)

## 2024-03-30 DIAGNOSIS — R413 Other amnesia: Secondary | ICD-10-CM | POA: Diagnosis present

## 2024-03-30 DIAGNOSIS — J189 Pneumonia, unspecified organism: Secondary | ICD-10-CM

## 2024-03-30 DIAGNOSIS — Z7982 Long term (current) use of aspirin: Secondary | ICD-10-CM | POA: Diagnosis not present

## 2024-03-30 DIAGNOSIS — I10 Essential (primary) hypertension: Secondary | ICD-10-CM

## 2024-03-30 DIAGNOSIS — Z9049 Acquired absence of other specified parts of digestive tract: Secondary | ICD-10-CM | POA: Diagnosis not present

## 2024-03-30 DIAGNOSIS — E66811 Obesity, class 1: Secondary | ICD-10-CM | POA: Diagnosis present

## 2024-03-30 DIAGNOSIS — R7303 Prediabetes: Secondary | ICD-10-CM

## 2024-03-30 DIAGNOSIS — J9601 Acute respiratory failure with hypoxia: Secondary | ICD-10-CM | POA: Diagnosis present

## 2024-03-30 DIAGNOSIS — A419 Sepsis, unspecified organism: Secondary | ICD-10-CM | POA: Diagnosis present

## 2024-03-30 DIAGNOSIS — F419 Anxiety disorder, unspecified: Secondary | ICD-10-CM | POA: Diagnosis present

## 2024-03-30 DIAGNOSIS — I6930 Unspecified sequelae of cerebral infarction: Secondary | ICD-10-CM | POA: Diagnosis not present

## 2024-03-30 DIAGNOSIS — Z96651 Presence of right artificial knee joint: Secondary | ICD-10-CM | POA: Diagnosis present

## 2024-03-30 DIAGNOSIS — E785 Hyperlipidemia, unspecified: Secondary | ICD-10-CM

## 2024-03-30 DIAGNOSIS — F32A Depression, unspecified: Secondary | ICD-10-CM | POA: Diagnosis present

## 2024-03-30 DIAGNOSIS — Z79899 Other long term (current) drug therapy: Secondary | ICD-10-CM | POA: Diagnosis not present

## 2024-03-30 DIAGNOSIS — Z792 Long term (current) use of antibiotics: Secondary | ICD-10-CM | POA: Diagnosis not present

## 2024-03-30 DIAGNOSIS — Z1152 Encounter for screening for COVID-19: Secondary | ICD-10-CM | POA: Diagnosis not present

## 2024-03-30 DIAGNOSIS — Z947 Corneal transplant status: Secondary | ICD-10-CM | POA: Diagnosis not present

## 2024-03-30 DIAGNOSIS — Z6834 Body mass index (BMI) 34.0-34.9, adult: Secondary | ICD-10-CM | POA: Diagnosis not present

## 2024-03-30 DIAGNOSIS — Z9851 Tubal ligation status: Secondary | ICD-10-CM | POA: Diagnosis not present

## 2024-03-30 DIAGNOSIS — Z833 Family history of diabetes mellitus: Secondary | ICD-10-CM | POA: Diagnosis not present

## 2024-03-30 LAB — CBC WITH DIFFERENTIAL/PLATELET
Abs Immature Granulocytes: 0.02 K/uL (ref 0.00–0.07)
Basophils Absolute: 0 K/uL (ref 0.0–0.1)
Basophils Relative: 0 %
Eosinophils Absolute: 0 K/uL (ref 0.0–0.5)
Eosinophils Relative: 0 %
HCT: 41.3 % (ref 36.0–46.0)
Hemoglobin: 14.1 g/dL (ref 12.0–15.0)
Immature Granulocytes: 0 %
Lymphocytes Relative: 17 %
Lymphs Abs: 1.1 K/uL (ref 0.7–4.0)
MCH: 31.7 pg (ref 26.0–34.0)
MCHC: 34.1 g/dL (ref 30.0–36.0)
MCV: 92.8 fL (ref 80.0–100.0)
Monocytes Absolute: 0.4 K/uL (ref 0.1–1.0)
Monocytes Relative: 6 %
Neutro Abs: 4.7 K/uL (ref 1.7–7.7)
Neutrophils Relative %: 77 %
Platelets: 216 K/uL (ref 150–400)
RBC: 4.45 MIL/uL (ref 3.87–5.11)
RDW: 13.4 % (ref 11.5–15.5)
WBC: 6.2 K/uL (ref 4.0–10.5)
nRBC: 0 % (ref 0.0–0.2)

## 2024-03-30 LAB — BASIC METABOLIC PANEL WITH GFR
Anion gap: 12 (ref 5–15)
BUN: 14 mg/dL (ref 8–23)
CO2: 24 mmol/L (ref 22–32)
Calcium: 8.8 mg/dL — ABNORMAL LOW (ref 8.9–10.3)
Chloride: 98 mmol/L (ref 98–111)
Creatinine, Ser: 0.64 mg/dL (ref 0.44–1.00)
GFR, Estimated: >60 mL/min
Glucose, Bld: 162 mg/dL — ABNORMAL HIGH (ref 70–99)
Potassium: 4.2 mmol/L (ref 3.5–5.1)
Sodium: 135 mmol/L (ref 135–145)

## 2024-03-30 LAB — RESP PANEL BY RT-PCR (RSV, FLU A&B, COVID)  RVPGX2
Influenza A by PCR: NEGATIVE
Influenza B by PCR: NEGATIVE
Resp Syncytial Virus by PCR: NEGATIVE
SARS Coronavirus 2 by RT PCR: NEGATIVE

## 2024-03-30 LAB — PROCALCITONIN: Procalcitonin: 0.16 ng/mL

## 2024-03-30 LAB — I-STAT CG4 LACTIC ACID, ED: Lactic Acid, Venous: 0.8 mmol/L (ref 0.5–1.9)

## 2024-03-30 LAB — STREP PNEUMONIAE URINARY ANTIGEN: Strep Pneumo Urinary Antigen: NEGATIVE

## 2024-03-30 MED ORDER — ATORVASTATIN CALCIUM 10 MG PO TABS
10.0000 mg | ORAL_TABLET | Freq: Every day | ORAL | Status: DC
  Administered 2024-03-30 – 2024-03-31 (×2): 10 mg via ORAL
  Filled 2024-03-30 (×2): qty 1

## 2024-03-30 MED ORDER — SODIUM CHLORIDE 0.9% FLUSH
3.0000 mL | Freq: Two times a day (BID) | INTRAVENOUS | Status: DC
  Administered 2024-03-30 – 2024-04-01 (×5): 3 mL via INTRAVENOUS

## 2024-03-30 MED ORDER — ACETAMINOPHEN 325 MG PO TABS
650.0000 mg | ORAL_TABLET | Freq: Once | ORAL | Status: AC
  Administered 2024-03-30: 650 mg via ORAL
  Filled 2024-03-30: qty 2

## 2024-03-30 MED ORDER — AMOXICILLIN-POT CLAVULANATE 875-125 MG PO TABS: 1.0000 | ORAL_TABLET | Freq: Two times a day (BID) | ORAL | 0 refills | Status: DC

## 2024-03-30 MED ORDER — SODIUM CHLORIDE 0.9 % IV SOLN
500.0000 mg | Freq: Once | INTRAVENOUS | Status: AC
  Administered 2024-03-30: 500 mg via INTRAVENOUS
  Filled 2024-03-30: qty 5

## 2024-03-30 MED ORDER — SODIUM CHLORIDE 0.9 % IV BOLUS
1000.0000 mL | Freq: Once | INTRAVENOUS | Status: AC
  Administered 2024-03-30: 1000 mL via INTRAVENOUS

## 2024-03-30 MED ORDER — IOHEXOL 350 MG/ML SOLN
75.0000 mL | Freq: Once | INTRAVENOUS | Status: AC | PRN
  Administered 2024-03-30: 75 mL via INTRAVENOUS

## 2024-03-30 MED ORDER — ALBUTEROL SULFATE (2.5 MG/3ML) 0.083% IN NEBU: 2.5000 mg | INHALATION_SOLUTION | RESPIRATORY_TRACT | Status: DC | PRN

## 2024-03-30 MED ORDER — MEMANTINE HCL 10 MG PO TABS
10.0000 mg | ORAL_TABLET | Freq: Two times a day (BID) | ORAL | Status: DC
  Administered 2024-03-30: 10 mg via ORAL
  Filled 2024-03-30 (×2): qty 1

## 2024-03-30 MED ORDER — LISINOPRIL 20 MG PO TABS
20.0000 mg | ORAL_TABLET | Freq: Every day | ORAL | Status: DC
  Administered 2024-03-30 – 2024-04-01 (×3): 20 mg via ORAL
  Filled 2024-03-30 (×3): qty 1

## 2024-03-30 MED ORDER — CALCIUM GLUCONATE-NACL 1-0.675 GM/50ML-% IV SOLN
1.0000 g | Freq: Once | INTRAVENOUS | Status: AC
  Administered 2024-03-30: 1000 mg via INTRAVENOUS
  Filled 2024-03-30: qty 50

## 2024-03-30 MED ORDER — AZITHROMYCIN 500 MG PO TABS
500.0000 mg | ORAL_TABLET | Freq: Every day | ORAL | Status: DC
  Administered 2024-03-31 – 2024-04-01 (×2): 500 mg via ORAL
  Filled 2024-03-30 (×2): qty 1

## 2024-03-30 MED ORDER — ACETAMINOPHEN 325 MG PO TABS
650.0000 mg | ORAL_TABLET | Freq: Four times a day (QID) | ORAL | Status: DC | PRN
  Administered 2024-03-30 – 2024-03-31 (×2): 650 mg via ORAL
  Filled 2024-03-30 (×2): qty 2

## 2024-03-30 MED ORDER — ENOXAPARIN SODIUM 40 MG/0.4ML IJ SOSY
40.0000 mg | PREFILLED_SYRINGE | INTRAMUSCULAR | Status: DC
  Administered 2024-03-30 – 2024-03-31 (×2): 40 mg via SUBCUTANEOUS
  Filled 2024-03-30 (×2): qty 0.4

## 2024-03-30 MED ORDER — ACETAMINOPHEN 650 MG RE SUPP: 650.0000 mg | Freq: Four times a day (QID) | RECTAL | Status: DC | PRN

## 2024-03-30 MED ORDER — AMLODIPINE BESYLATE 5 MG PO TABS
10.0000 mg | ORAL_TABLET | Freq: Every day | ORAL | Status: DC
  Administered 2024-03-30 – 2024-04-01 (×3): 10 mg via ORAL
  Filled 2024-03-30 (×3): qty 2

## 2024-03-30 MED ORDER — SODIUM CHLORIDE 0.9 % IV SOLN
1.0000 g | Freq: Once | INTRAVENOUS | Status: AC
  Administered 2024-03-30: 1 g via INTRAVENOUS
  Filled 2024-03-30: qty 10

## 2024-03-30 MED ORDER — GUAIFENESIN ER 600 MG PO TB12
600.0000 mg | ORAL_TABLET | Freq: Two times a day (BID) | ORAL | Status: DC
  Administered 2024-03-30 – 2024-04-01 (×5): 600 mg via ORAL
  Filled 2024-03-30 (×5): qty 1

## 2024-03-30 MED ORDER — DOXYCYCLINE HYCLATE 100 MG PO CAPS: 100.0000 mg | ORAL_CAPSULE | Freq: Two times a day (BID) | ORAL | 0 refills | Status: DC

## 2024-03-30 MED ORDER — SODIUM CHLORIDE 0.9 % IV SOLN
2.0000 g | INTRAVENOUS | Status: DC
  Administered 2024-03-31 – 2024-04-01 (×2): 2 g via INTRAVENOUS
  Filled 2024-03-30 (×2): qty 20

## 2024-03-30 NOTE — ED Provider Notes (Addendum)
 " [place] [facility] Provider Note   CSN: [id number] Arrival date & time: 03/29/24  2311     Patient presents with: Chest Congestion / Cough  (Fever)   Michelle Carrillo is a 73 y.o. female with history of memory loss, lumbar radiculopathy, chronic back pain, hypertension, anxiety.  Presents to ED with her daughter for evaluation of chest congestion, cough and fever.  Patient daughter reports that for the last few days the patient has been increasingly fatigued at home, complaining of chest congestion, cough, fever.  No known sick contacts in the household.  No chest pain.  No nausea, vomiting, diarrhea at home.  Patient arrives to ED hypoxic at 88% room air.  Workup initiated.  HPI     Prior to Admission medications  Medication Sig Start Date End Date Taking? Authorizing Provider  amoxicillin -clavulanate (AUGMENTIN ) 875-125 MG tablet Take 1 tablet by mouth every 12 (twelve) hours. 03/30/24  Yes Ruthell Lonni FALCON, PA-C  doxycycline  (VIBRAMYCIN ) 100 MG capsule Take 1 capsule (100 mg total) by mouth 2 (two) times daily. 03/30/24  Yes Ruthell Lonni FALCON, PA-C  acetaminophen  (TYLENOL ) 500 MG tablet Take 1 tablet (500 mg total) by mouth every 6 (six) hours as needed. 12/27/23   Ngetich, Dinah C, NP  amLODipine  (NORVASC ) 10 MG tablet Take 1 tablet (10 mg total) by mouth daily. APPOINTMENT DUE PRIOR TO ADDITIONAL REFILLS 12/27/23   Ngetich, Dinah C, NP  aspirin  EC 81 MG tablet Take 1 tablet (81 mg total) by mouth daily. To be taken after surgery Patient taking differently: Take 81 mg by mouth as needed. To be taken after surgery 12/27/23   Ngetich, Dinah C, NP  atorvastatin  (LIPITOR) 10 MG tablet TAKE 1 TABLET AT BEDTIME. Needs an appointment before anymore future refills. 12/27/23   Ngetich, Dinah C, NP  lisinopril  (ZESTRIL ) 20 MG tablet Take 1 tablet (20 mg total) by mouth daily. APPOINTMENT DUE PRIOR TO ADDITIONAL REFILLS 12/27/23   Ngetich, Dinah C, NP   memantine  (NAMENDA ) 10 MG tablet Take 1 tablet (10 mg total) by mouth 2 (two) times daily. Start 1/2 tab at night x 1 week and then twice daily x 4 weeks and then 1 tab twice daily 03/05/24   Sethi, Pramod S, MD  PARoxetine  (PAXIL ) 20 MG tablet Take 1 tablet (20 mg total) by mouth daily. Patient not taking: Reported on 03/05/2024 01/05/24   Ngetich, Dinah C, NP    Allergies: Patient has no known allergies.    Review of Systems  All other systems reviewed and are negative.   Updated Vital Signs BP (!) 130/58 (BP Location: Right Arm)   Pulse 89   Temp 99.3 F (37.4 C) (Oral)   Resp 16   SpO2 93%   Physical Exam Vitals and nursing note reviewed.  Constitutional:      General: She is not in acute distress.    Appearance: She is well-developed.  HENT:     Head: Normocephalic and atraumatic.  Eyes:     Conjunctiva/sclera: Conjunctivae normal.  Cardiovascular:     Rate and Rhythm: Normal rate and regular rhythm.     Heart sounds: No murmur heard. Pulmonary:     Effort: Pulmonary effort is normal. No respiratory distress.     Breath sounds: Normal breath sounds.  Abdominal:     Palpations: Abdomen is soft.     Tenderness: There is no abdominal tenderness.  Musculoskeletal:        General: No  swelling.     Cervical back: Neck supple.  Skin:    General: Skin is warm and dry.     Capillary Refill: Capillary refill takes less than 2 seconds.  Neurological:     Mental Status: She is alert and oriented to person, place, and time. Mental status is at baseline.  Psychiatric:        Mood and Affect: Mood normal.     (all labs ordered are listed, but only abnormal results are displayed) Labs Reviewed  BASIC METABOLIC PANEL WITH GFR - Abnormal; Notable for the following components:      Result Value   Glucose, Bld 162 (*)    Calcium  8.8 (*)    All other components within normal limits  RESP PANEL BY RT-PCR (RSV, FLU A&B, COVID)  RVPGX2  CBC WITH DIFFERENTIAL/PLATELET   PROCALCITONIN  I-STAT CG4 LACTIC ACID, ED  I-STAT CG4 LACTIC ACID, ED    EKG: None  Radiology: CT Angio Chest PE W and/or Wo Contrast Result Date: 03/30/2024 EXAM: CTA CHEST 03/30/2024 05:15:49 AM TECHNIQUE: CTA of the chest was performed without and with the administration of 75 mL of iohexol  (OMNIPAQUE ) 350 MG/ML injection. Multiplanar reformatted images are provided for review. MIP images are provided for review. Automated exposure control, iterative reconstruction, and/or weight based adjustment of the mA/kV was utilized to reduce the radiation dose to as low as reasonably achievable. COMPARISON: CT angiogram of the chest dated 10/15/2014. CLINICAL HISTORY: Pulmonary embolism (PE) suspected, high prob. FINDINGS: PULMONARY ARTERIES: Pulmonary arteries are adequately opacified for evaluation. No acute pulmonary embolus. Main pulmonary artery is normal in caliber. MEDIASTINUM: The heart and pericardium demonstrate no acute abnormality. There is no acute abnormality of the thoracic aorta. LYMPH NODES: No mediastinal, hilar or axillary lymphadenopathy. LUNGS AND PLEURA: There is moderate consolidation of the left lower lobe, concerning for pneumonia. A few hazy and reticular opacities are present in the dependent periphery of the right lower lobe. No evidence of pleural effusion or pneumothorax. UPPER ABDOMEN: Surgical clips are present in the gallbladder fossa. SOFT TISSUES AND BONES: No acute bone or soft tissue abnormality. IMPRESSION: 1. No evidence of pulmonary embolus. 2. Moderate consolidation of the left lower lobe, concerning for pneumonia. 3. A few hazy and reticular opacities in the dependent periphery of the right lower lobe, which may represent atelectasis or additional infectious/inflammatory change. Electronically signed by: Evalene Coho MD 03/30/2024 05:28 AM EST RP Workstation: [id number]   DG Chest 2 View Result Date: 03/29/2024 EXAM: 2 VIEW(S) XRAY OF THE CHEST 03/29/2024  11:46:42 PM COMPARISON: Chest x-ray 10/07/2000. CLINICAL HISTORY: Cough/chest congestion. FINDINGS: LUNGS AND PLEURA: There are some mild patchy opacities in the left lung base. There is no pleural effusion or pneumothorax. HEART AND MEDIASTINUM: No acute abnormality of the cardiac and mediastinal silhouettes. BONES AND SOFT TISSUES: No acute osseous abnormality. IMPRESSION: 1. Mild patchy opacities in the left lung base worrisome for infection. Recommend follow-up chest x-ray in 4 to 6 weeks to confirm resolution . 2. No pleural effusion or pneumothorax. Electronically signed by: Greig Pique MD 03/29/2024 11:53 PM EST RP Workstation: [id number]    .Critical Care  Performed by: Ruthell Lonni FALCON, PA-C Authorized by: Ruthell Lonni FALCON, PA-C   Critical care provider statement:    Critical care time (minutes):  45   Critical care time was exclusive of:  Separately billable procedures and treating other patients   Critical care was necessary to treat or prevent imminent or life-threatening deterioration of the following  conditions:  Respiratory failure   Critical care was time spent personally by me on the following activities:  Blood draw for specimens, development of treatment plan with patient or surrogate, discussions with primary provider, discussions with consultants, evaluation of patient's response to treatment, examination of patient, interpretation of cardiac output measurements, ordering and performing treatments and interventions, ordering and review of laboratory studies, ordering and review of radiographic studies, re-evaluation of patient's condition, pulse oximetry, review of old charts and obtaining history from patient or surrogate   I assumed direction of critical care for this patient from another provider in my specialty: no     Care discussed with: admitting provider      Medications Ordered in the ED  acetaminophen  (TYLENOL ) tablet 650 mg (650 mg Oral Given 03/29/24 2322)   sodium chloride  0.9 % bolus 1,000 mL (0 mLs Intravenous Stopped 03/30/24 0447)  cefTRIAXone  (ROCEPHIN ) 1 g in sodium chloride  0.9 % 100 mL IVPB (0 g Intravenous Stopped 03/30/24 0427)  azithromycin  (ZITHROMAX ) 500 mg in sodium chloride  0.9 % 250 mL IVPB (500 mg Intravenous New Bag/Given 03/30/24 0523)  iohexol  (OMNIPAQUE ) 350 MG/ML injection 75 mL (75 mLs Intravenous Contrast Given 03/30/24 0516)    Medical Decision Making Amount and/or Complexity of Data Reviewed Labs: ordered. Radiology: ordered.  Risk OTC drugs. Prescription drug management.   73 year old female presents to ED for evaluation.  On exam, HD stable.  Lung sounds are clear bilaterally, on oxygen currently.  Patient abdomen soft and compressible.  Neuroexam at baseline.  Overall nontoxic in appearance.  Assessed with CBC, BMP, viral panel, procalcitonin, lactic acid, chest x-ray and EKG.  CBC without leukocytosis or anemia.  Metabolic panel is grossly unremarkable.  Lactic acid not elevated at 0.8.  Procalcitonin 0.16.  Viral panel negative for all.  EKG nonischemic.  Chest x-ray shows mild patchy opacities in the left lung base worrisome for infection.  No pleural effusion.  Patient did arrive seemingly hypoxic with oxygen saturation 88% with fever of 101.7 and tachycardic to 124.  Patient was given Tylenol  in triage and patient fever has reduced to 99.3.  Her pulse rate is currently 80 1:09 liter of fluid.  CTA was obtained for further clarification and to rule out PE.  No evidence of PE however does confirm pneumonia.  Patient was ambulated prior to discharge maintain oxygen saturation 95 to 96% as documented by RN staff.  However, there was concern by nursing staff that possibly there was not good waveform, reading of her pulse oximeter.  The pulse oximeter was changed and the patient was ambulated once more.  The patient oxygen saturation did drop to 88% ambulating on room air.  The patient will require admission to the  hospital for hypoxia secondary to pneumonia.  Patient received azithromycin , Rocephin  for community-acquired pneumonia.  At this time, patient signed out to oncoming provider [facility] PA-C.  Plan of management discussed.  Plan for admission.    Final diagnoses:  Community acquired pneumonia of left lung, unspecified part of lung    ED Discharge Orders          Ordered    amoxicillin -clavulanate (AUGMENTIN ) 875-125 MG tablet  Every 12 hours        03/30/24 0623    doxycycline  (VIBRAMYCIN ) 100 MG capsule  2 times daily        03/30/24 9376                   Ruthell Lonni FALCON, PA-C 03/30/24  9361    Raford Lenis, MD 03/30/24 [phone]  "

## 2024-03-30 NOTE — Discharge Instructions (Addendum)
 It was a pleasure taking part in your care.  As discussed, you have pneumonia causing your symptoms.  Please take Tylenol  every 6 hours for fevers at home.  Take antibiotics Augmentin twice a day for 7 days.  Take antibiotic doxycycline twice a day for 7 days.  Follow-up with your PCP in 5 days for reevaluation.  Return to ED with new symptoms.  Fue un building surveyor atencin. Como ya se mencion, sus sntomas le causan neumona. Tome Tylenol  cada 6 horas para la interior and spatial designer. Tome el antibitico Augmentin dos veces al da durante 7 [address]  [phone]. Tome el antibitico doxiciclina dos veces al da durante 7 [address] Consulte con su mdico de cabecera en 5 das para una reevaluacin. Regrese a urgencias si presenta nuevos sntomas.

## 2024-03-30 NOTE — ED Notes (Signed)
 Pt tolerated ambulation well. O2 sats remained between 97-98 the majority of the time. PA Groce advised

## 2024-03-30 NOTE — ED Notes (Signed)
 Pt ambulated to the bathroom.

## 2024-03-30 NOTE — ED Notes (Signed)
 Pt O2 sat reading 91%, ambulated Pt with pulse ox and pt became hypoxic with sat between 88-90%. PA Groce advised

## 2024-03-30 NOTE — H&P (Signed)
 " History and Physical    Patient: Michelle Carrillo FMW:[id number] DOB: June 08, 1950 DOA: 03/29/2024 DOS: the patient was seen and examined on 03/30/2024 PCP: Ngetich, Michelle BROCKS, NP  Patient coming from: Home  Chief Complaint:  Chief Complaint  Patient presents with   Chest Congestion / Cough     Fever   HPI: Michelle Carrillo is a 73 y.o. mostly Spanish-speaking female with medical history significant of hypertension, hyperlipidemia, CVA, history of craniotomy, memory loss, osteoarthritis, chronic back pain, and anxiety presents with fever, cough, and congestion.  Patient's daughter is present at bedside and helps interpret.  She has had a cough and congestion for three to four days.  She has been experiencing fever for the past two to three days. Chills accompany the fever. She reports a headache and fatigue in addition to her respiratory symptoms.  There is a history of exposure to a sick family member, her daughter. No chest pain, stomach pain, nausea, vomiting, diarrhea, or dysuria symptoms reported.  Normally she is not on oxygen at baseline.  In the emergency department patient was noted to be febrile up to 101.7 F with heart rates elevated up to 124, respirations 22, blood pressures maintained, and O2 saturations as low as 88% on room air for which patient was placed on 2 L nasal cannula oxygen.  Labs from 12/18 noted WBC 6.2, glucose 162, calcium  8.8, lactic acid 0.8, and procalcitonin 0.16.  Chest x-ray noted mild patchy opacities in the left lung base worrisome for infection with no pleural effusion or pneumothorax.  CT angiogram of the chest was obtained and confirmed moderate consolidation in the left lower lobe concerning for pneumonia, a few hazy reticular opacities in the right lower lung thought to represent atelectasis or additional infectious/inflammatory changes, and no signs of a pulmonary embolism.  Patient had been given acetaminophen  650 mg p.o. x 3, 1 L normal  saline IV fluids, Rocephin, and azithromycin.  Review of Systems: As mentioned in the history of present illness. All other systems reviewed and are negative. Past Medical History:  Diagnosis Date   Anxiety    on meds   Arthritis    bilateral knees   Cataract    RIGHT eye   Depression    on meds   Healthcare maintenance 06/20/2019   Herpes zoster kerstitis s/p corneal transplant 2015    History of strabismus surgery 03/31/2019   03/31/2019 1. Left medial rectus recession 6.45mm to11.77mm from limbus 2. Left medial rectus adjustable suture 3. Left inferior rectus 5.36mm central tenotomy 4. Left inferior rectus adjustable suture  Last Assessment & Plan:  03/31/2019 1. Left medial rectus recession 6.41mm to11.49mm from limbus 2. Left medial rectus adjustable suture 3. Left inferior rectus 5.65mm central tenotomy 4. Left inferior r   Hypertension    on meds   Right knee osteoarthritis    Status post total right knee replacement 10/10/2020   Strabismus    due to eye injury as a child   Stroke (HCC) 07/2019   stroke with craniotomy   Past Surgical History:  Procedure Laterality Date   CESAREAN SECTION      x 2    CHOLECYSTECTOMY     COLONOSCOPY  04/22/2020   CORNEAL TRANSPLANT Left    2006 and 2016   CRANIOTOMY Left 08/09/2019   Procedure: CRANIOTOMY HEMATOMA EVACUATION SUBDURAL;  Surgeon: Gillie Duncans, MD;  Location: [facility] OR;  Service: Neurosurgery;  Laterality: Left;  left   EYE SURGERY  TOTAL KNEE ARTHROPLASTY Right 10/10/2020   Procedure: RIGHT TOTAL KNEE ARTHROPLASTY;  Surgeon: Jerri Kay HERO, MD;  Location: [facility] OR;  Service: Orthopedics;  Laterality: Right;   TUBAL LIGATION     Social History:  reports that she has never smoked. She has never used smokeless tobacco. She reports that she does not drink alcohol and does not use drugs.  Allergies[1]  Family History  Problem Relation Age of Onset   Diabetes Mother    Ovarian cancer Mother    High blood pressure Sister    Diabetes  Sister    Diabetes Brother    Stomach cancer Maternal Grandmother 59   Colon polyps Neg Hx    Colon cancer Neg Hx    Rectal cancer Neg Hx    Esophageal cancer Neg Hx     Prior to Admission medications  Medication Sig Start Date End Date Taking? Authorizing Provider  amoxicillin -clavulanate (AUGMENTIN ) 875-125 MG tablet Take 1 tablet by mouth every 12 (twelve) hours. 03/30/24  Yes Ruthell Lonni FALCON, PA-Carrillo  doxycycline  (VIBRAMYCIN ) 100 MG capsule Take 1 capsule (100 mg total) by mouth 2 (two) times daily. 03/30/24  Yes Ruthell Lonni FALCON, PA-Carrillo  acetaminophen  (TYLENOL ) 500 MG tablet Take 1 tablet (500 mg total) by mouth every 6 (six) hours as needed. 12/27/23   Ngetich, Michelle C, NP  amLODipine  (NORVASC ) 10 MG tablet Take 1 tablet (10 mg total) by mouth daily. APPOINTMENT DUE PRIOR TO ADDITIONAL REFILLS 12/27/23   Ngetich, Michelle C, NP  aspirin  EC 81 MG tablet Take 1 tablet (81 mg total) by mouth daily. To be taken after surgery Patient taking differently: Take 81 mg by mouth as needed. To be taken after surgery 12/27/23   Ngetich, Michelle C, NP  atorvastatin  (LIPITOR) 10 MG tablet TAKE 1 TABLET AT BEDTIME. Needs an appointment before anymore future refills. 12/27/23   Ngetich, Michelle C, NP  lisinopril  (ZESTRIL ) 20 MG tablet Take 1 tablet (20 mg total) by mouth daily. APPOINTMENT DUE PRIOR TO ADDITIONAL REFILLS 12/27/23   Ngetich, Michelle C, NP  memantine  (NAMENDA ) 10 MG tablet Take 1 tablet (10 mg total) by mouth 2 (two) times daily. Start 1/2 tab at night x 1 week and then twice daily x 4 weeks and then 1 tab twice daily 03/05/24   Sethi, Pramod S, MD  PARoxetine  (PAXIL ) 20 MG tablet Take 1 tablet (20 mg total) by mouth daily. Patient not taking: Reported on 03/05/2024 01/05/24   NgetichRoxan BROCKS, NP    Physical Exam: Vitals:   03/30/24 0442 03/30/24 0457 03/30/24 0526 03/30/24 0628  BP:   (!) 130/58 (!) 137/117  Pulse:   89 99  Resp:   16 16  Temp:   99.3 F (37.4 Carrillo) 99.8 F (37.7 Carrillo)   TempSrc:   Oral Oral  SpO2: 96% 96% 93% 91%   Constitutional: Elderly female who appears ill, but able to follow commands Eyes: PERRL, lids and conjunctivae normal ENMT: Mucous membranes are moist.   Normal dentition.  Neck: normal, supple  Respiratory: Normal respiratory effort with some rhonchi appreciated in the left lower lung field.  No significant wheezing appreciated.  Patient able to speak in complete sentences Cardiovascular: Regular rate and rhythm, no murmurs / rubs / gallops. No extremity edema. 2+ pedal pulses  Abdomen: no tenderness, no masses palpated.  Bowel sounds positive.  Musculoskeletal: no clubbing / cyanosis. No joint deformity upper and lower extremities. Good ROM, no contractures. Normal muscle tone.  Skin: no rashes,  lesions, ulcers. No induration Neurologic: CN 2-12 grossly intact. S Strength 5/5 in all 4.  Psychiatric: Normal judgment and insight. Alert and oriented x 3. Normal mood.   Data Reviewed:  Orders placed to check EKG. Reviewed labs, imaging, and pertinent records as documented.  Assessment and Plan:  Acute respiratory failure with hypoxia Sepsis secondary to pneumonia Patient presented with complaints of cough, congestion, and fever.  Noted to be febrile up to 101.8 F with tachycardia, tachypnea, and hypoxia down to 88% on room air with improvement on 2 L of nasal cannula oxygen.  Chest x-ray noted left lower lobe opacities which were confirmed to be concerning for pneumonia on CT angiogram of the chest.  No signs of a pulmonary embolism.  Patient met sepsis criteria.  Lactic acid was reassuring at procalcitonin was 0.16.  Blood cultures were not initially obtained.  Patient was given acetaminophen , 1 L normal saline IV fluids, Rocephin, and azithromycin. - Admit to medical telemetry bed - Pneumonia order set utilized - Continuous pulse oximetry with nasal cannula oxygen to maintain O2 saturation greater than 92%.  Weaned to room air once able -  Incentive spirometry and flutter valve - Check single blood culture - Continue empiric antibiotics of Rocephin and azithromycin - Mucinex - Tylenol  as needed for fever  Essential hypertension Blood pressures currently maintained. - Continue lisinopril  and amlodipine   History of CVA with residual deficit Hyperlipidemia Patient daughter reports patient has some residual vision deficits as well as memory issues. - Continue aspirin  and statin  Memory loss Patient has some memory loss thought to be related with prior stroke.  She was recently started on medication. - Delirium precaution - Namenda   Prediabetes On admission glucose noted to be elevated up to 165.   Last hemoglobin A1c was 6 when checked 07/01/2022. Suspect secondary to acute stress response.  -Check hemoglobin A1c in a.m. - Continue to monitor  Anxiety   - Continue paroxetine   DVT prophylaxis: Lovenox Advance Care Planning:   Code Status: Full Code   Consults: None  Family Communication: Daughter updated at bedside  Severity of Illness: The appropriate patient status for this patient is INPATIENT. Inpatient status is judged to be reasonable and necessary in order to provide the required intensity of service to ensure the patient's safety. The patient's presenting symptoms, physical exam findings, and initial radiographic and laboratory data in the context of their chronic comorbidities is felt to place them at high risk for further clinical deterioration. Furthermore, it is not anticipated that the patient will be medically stable for discharge from the hospital within 2 midnights of admission.   * I certify that at the point of admission it is my clinical judgment that the patient will require inpatient hospital care spanning beyond 2 midnights from the point of admission due to high intensity of service, high risk for further deterioration and high frequency of surveillance required.*  Author: Maximino DELENA Sharps,  MD 03/30/2024 7:28 AM  For on call review [email or web address].     [1] No Known Allergies  "

## 2024-03-30 NOTE — ED Provider Notes (Signed)
" °  Accepted handoff at shift change from Groce PA-C. Please see prior provider note for more detail.   Briefly: Patient is 73 y.o. chest congestion, cough and fever. Patient daughter reports that for the last few days the patient has been increasingly fatigued at home, complaining of chest congestion, cough, fever. No known sick contacts in the household. No chest pain. No nausea, vomiting, diarrhea at home. Patient arrives to ED hypoxic at 88% room air. Workup initiated.   Plan:  - Plan for admission for LLL PNA (and possibly developing RLL PNA) and hypoxia to 88% during ambulation. Prior provider starting IV ABX and fluids. - Dr. Claudene admitting provider.    Michelle Carrillo, [place] 03/30/24 0719    Raford Lenis, MD 03/30/24 [phone]  "

## 2024-03-31 DIAGNOSIS — A419 Sepsis, unspecified organism: Secondary | ICD-10-CM | POA: Diagnosis not present

## 2024-03-31 DIAGNOSIS — J189 Pneumonia, unspecified organism: Secondary | ICD-10-CM | POA: Diagnosis not present

## 2024-03-31 LAB — CBC
HCT: 35.4 % — ABNORMAL LOW (ref 36.0–46.0)
Hemoglobin: 12 g/dL (ref 12.0–15.0)
MCH: 31 pg (ref 26.0–34.0)
MCHC: 33.9 g/dL (ref 30.0–36.0)
MCV: 91.5 fL (ref 80.0–100.0)
Platelets: 187 K/uL (ref 150–400)
RBC: 3.87 MIL/uL (ref 3.87–5.11)
RDW: 13.5 % (ref 11.5–15.5)
WBC: 5.4 K/uL (ref 4.0–10.5)
nRBC: 0 % (ref 0.0–0.2)

## 2024-03-31 LAB — HEMOGLOBIN A1C
Hgb A1c MFr Bld: 5.8 % — ABNORMAL HIGH (ref 4.8–5.6)
Mean Plasma Glucose: 119.76 mg/dL

## 2024-03-31 LAB — COMPREHENSIVE METABOLIC PANEL WITH GFR
ALT: 18 U/L (ref 0–44)
AST: 30 U/L (ref 15–41)
Albumin: 3.3 g/dL — ABNORMAL LOW (ref 3.5–5.0)
Alkaline Phosphatase: 86 U/L (ref 38–126)
Anion gap: 8 (ref 5–15)
BUN: 12 mg/dL (ref 8–23)
CO2: 27 mmol/L (ref 22–32)
Calcium: 8.2 mg/dL — ABNORMAL LOW (ref 8.9–10.3)
Chloride: 104 mmol/L (ref 98–111)
Creatinine, Ser: 0.58 mg/dL (ref 0.44–1.00)
GFR, Estimated: >60 mL/min
Glucose, Bld: 106 mg/dL — ABNORMAL HIGH (ref 70–99)
Potassium: 3.9 mmol/L (ref 3.5–5.1)
Sodium: 139 mmol/L (ref 135–145)
Total Bilirubin: 0.3 mg/dL (ref 0.0–1.2)
Total Protein: 6.4 g/dL — ABNORMAL LOW (ref 6.5–8.1)

## 2024-03-31 MED ORDER — MEMANTINE HCL 10 MG PO TABS
5.0000 mg | ORAL_TABLET | Freq: Two times a day (BID) | ORAL | Status: DC
  Administered 2024-03-31 – 2024-04-01 (×2): 5 mg via ORAL
  Filled 2024-03-31 (×2): qty 1

## 2024-03-31 MED ORDER — MEMANTINE HCL 10 MG PO TABS: 10.0000 mg | ORAL_TABLET | Freq: Two times a day (BID) | ORAL | Status: DC

## 2024-03-31 MED ORDER — MEMANTINE HCL 10 MG PO TABS: 5.0000 mg | ORAL_TABLET | Freq: Two times a day (BID) | ORAL | Status: DC

## 2024-03-31 MED ORDER — ASPIRIN 81 MG PO TBEC
81.0000 mg | DELAYED_RELEASE_TABLET | Freq: Every day | ORAL | Status: DC
  Administered 2024-03-31 – 2024-04-01 (×2): 81 mg via ORAL
  Filled 2024-03-31 (×2): qty 1

## 2024-03-31 NOTE — Plan of Care (Signed)
  Problem: Clinical Measurements: Goal: Respiratory complications will improve Outcome: Progressing   Problem: Activity: Goal: Risk for activity intolerance will decrease Outcome: Progressing   Problem: Nutrition: Goal: Adequate nutrition will be maintained Outcome: Progressing   

## 2024-03-31 NOTE — Progress Notes (Signed)
 " PROGRESS NOTE    [name]  FMW:[id number] DOB: 17-Nov-1950 DOA: 03/29/2024 PCP: Leonarda Roxan BROCKS, NP  Chief Complaint  Patient presents with   Chest Congestion / Cough     Fever    Brief Narrative:   Michelle Carrillo is Michelle Carrillo 73 y.o. mostly Spanish-speaking female with medical history significant of hypertension, hyperlipidemia, CVA, history of craniotomy, memory loss, osteoarthritis, chronic back pain, and anxiety presents with fever, cough, and congestion.  Patient's daughter is present at bedside and helps interpret.   Assessment & Plan:   Principal Problem:   Sepsis due to pneumonia [facility]) Active Problems:   Acute respiratory failure with hypoxia (HCC)   Essential hypertension   Hyperlipemia   History of CVA with residual deficit   Memory loss   Prediabetes   Anxiety  Acute respiratory failure with hypoxia Sepsis secondary to pneumonia Patient presented with complaints of cough, congestion, and fever.  Noted to be febrile up to 101.8 F with tachycardia, tachypnea, and hypoxia down to 88% on room air with improvement on 2 L of nasal cannula oxygen.  Chest x-ray noted left lower lobe opacities which were confirmed to be concerning for pneumonia on CT angiogram of the chest.   Patient met sepsis criteria.  Blood culture  Sputum culture Urine strep negative.  Legionella pending. Negative covid, flu, RSV Ceftriaxone , azithro  Essential hypertension Continue lisinopril  and amlodipine    History of CVA with residual deficit Hyperlipidemia Patient daughter reports patient has some residual vision deficits as well as memory issues. Continue aspirin  and statin   Memory loss Patient has some memory loss thought to be related with prior stroke.  She was recently started on medication. Delirium precaution Namenda    Prediabetes A1c 5.8   Anxiety   - Continue paroxetine   Obesity, Class 1 Body mass index is 34.74 kg/m.    DVT prophylaxis:  lovenox  Code Status: full Family Communication: family at bedside Disposition:   Status is: Inpatient Remains inpatient appropriate because: need for continued inpatient care   Consultants:  none  Procedures:  none  Antimicrobials:  Anti-infectives (From admission, onward)    Start     Dose/Rate Route Frequency Ordered Stop   03/31/24 1000  azithromycin  (ZITHROMAX ) tablet 500 mg        500 mg Oral Daily 03/30/24 0741 04/04/24 0959   03/31/24 0600  cefTRIAXone  (ROCEPHIN ) 2 g in sodium chloride  0.9 % 100 mL IVPB        2 g 200 mL/hr over 30 Minutes Intravenous Every 24 hours 03/30/24 0741 04/04/24 0559   03/30/24 0745  cefTRIAXone  (ROCEPHIN ) 1 g in sodium chloride  0.9 % 100 mL IVPB        1 g 200 mL/hr over 30 Minutes Intravenous  Once 03/30/24 0741 03/30/24 0942   03/30/24 0315  cefTRIAXone  (ROCEPHIN ) 1 g in sodium chloride  0.9 % 100 mL IVPB        1 g 200 mL/hr over 30 Minutes Intravenous  Once 03/30/24 0304 03/30/24 0427   03/30/24 0315  azithromycin  (ZITHROMAX ) 500 mg in sodium chloride  0.9 % 250 mL IVPB        500 mg 250 mL/hr over 60 Minutes Intravenous  Once 03/30/24 0304 03/30/24 0623   03/30/24 0000  amoxicillin -clavulanate (AUGMENTIN ) 875-125 MG tablet        1 tablet Oral Every 12 hours 03/30/24 0623     03/30/24 0000  doxycycline  (VIBRAMYCIN ) 100 MG capsule        100  mg Oral 2 times daily 03/30/24 9376         Subjective: Mild SOB  Declined interpreter, daughter at bedside assisted  Objective: Vitals:   03/31/24 0352 03/31/24 0958 03/31/24 1212 03/31/24 1553  BP: 128/61 133/71 (!) 124/58 126/67  Pulse: 75 86 83   Resp: 18 18 17 18   Temp: 98.7 F (37.1 C) 98.1 F (36.7 C) 98.5 F (36.9 C) 97.7 F (36.5 C)  TempSrc: Oral Oral Oral Oral  SpO2: 96% 91% 92% 94%  Weight:      Height:        Intake/Output Summary (Last 24 hours) at 03/31/2024 1645 Last data filed at 03/31/2024 0045 Gross per 24 hour  Intake 240 ml  Output --  Net 240 ml   Filed  Weights   03/30/24 0800  Weight: 78 kg    Examination:  General exam: Appears calm and comfortable  Respiratory system: crackles on L Cardiovascular system: RRR Gastrointestinal system: Abdomen is nondistended, soft and nontender. Central nervous system: Alert and oriented. No focal neurological deficits. Extremities: no LEE    Data Reviewed: I have personally reviewed following labs and imaging studies  CBC: Recent Labs  Lab 03/29/24 2326 03/31/24 0600  WBC 6.2 5.4  NEUTROABS 4.7  --   HGB 14.1 12.0  HCT 41.3 35.4*  MCV 92.8 91.5  PLT 216 187    Basic Metabolic Panel: Recent Labs  Lab 03/29/24 2326 03/31/24 0600  NA 135 139  K 4.2 3.9  CL 98 104  CO2 24 27  GLUCOSE 162* 106*  BUN 14 12  CREATININE 0.64 0.58  CALCIUM  8.8* 8.2*    GFR: Estimated Creatinine Clearance: 58.1 mL/min (by C-G formula based on SCr of 0.58 mg/dL).  Liver Function Tests: Recent Labs  Lab 03/31/24 0600  AST 30  ALT 18  ALKPHOS 86  BILITOT 0.3  PROT 6.4*  ALBUMIN 3.3*    CBG: No results for input(s): GLUCAP in the last 168 hours.   Recent Results (from the past 240 hours)  Resp panel by RT-PCR (RSV, Flu Yisel Megill&B, Covid) Anterior Nasal Swab     Status: None   Collection Time: 03/29/24 11:21 PM   Specimen: Anterior Nasal Swab  Result Value Ref Range Status   SARS Coronavirus 2 by RT PCR NEGATIVE NEGATIVE Final   Influenza Genevieve Ritzel by PCR NEGATIVE NEGATIVE Final   Influenza B by PCR NEGATIVE NEGATIVE Final    Comment: (NOTE) The Xpert Xpress SARS-CoV-2/FLU/RSV plus assay is intended as an aid in the diagnosis of influenza from Nasopharyngeal swab specimens and should not be used as Karyme Mcconathy sole basis for treatment. Nasal washings and aspirates are unacceptable for Xpert Xpress SARS-CoV-2/FLU/RSV testing.  Fact Sheet for Patients: bloggercourse.com  Fact Sheet for Healthcare Providers: seriousbroker.it  This test is not yet  approved or cleared by the [place]  FDA and has been authorized for detection and/or diagnosis of SARS-CoV-2 by FDA under an Emergency Use Authorization (EUA). This EUA will remain in effect (meaning this test can be used) for the duration of the COVID-19 declaration under Section 564(b)(1) of the Act, 21 U.S.C. section 360bbb-3(b)(1), unless the authorization is terminated or revoked.     Resp Syncytial Virus by PCR NEGATIVE NEGATIVE Final    Comment: (NOTE) Fact Sheet for Patients: bloggercourse.com  Fact Sheet for Healthcare Providers: seriousbroker.it  This test is not yet approved or cleared by the [place]  FDA and has been authorized for detection and/or diagnosis of SARS-CoV-2 by FDA  under an Emergency Use Authorization (EUA). This EUA will remain in effect (meaning this test can be used) for the duration of the COVID-19 declaration under Section 564(b)(1) of the Act, 21 U.S.C. section 360bbb-3(b)(1), unless the authorization is terminated or revoked.  Performed at [facility], [address], [place], [place] [address]   Culture, blood (single) w Reflex to ID Panel     Status: None (Preliminary result)   Collection Time: 03/30/24  8:06 AM   Specimen: BLOOD  Result Value Ref Range Status   Specimen Description BLOOD SITE NOT SPECIFIED  Final   Special Requests   Final    BOTTLES DRAWN AEROBIC AND ANAEROBIC Blood Culture adequate volume   Culture   Final    NO GROWTH 1 DAY Performed at [facility], [address] [place], [place] [address]    Report Status PENDING  Incomplete         Radiology Studies: CT Angio Chest PE W and/or Wo Contrast Result Date: 03/30/2024 EXAM: CTA CHEST 03/30/2024 05:15:49 AM TECHNIQUE: CTA of the chest was performed without and with the administration of 75 mL of iohexol  (OMNIPAQUE ) 350 MG/ML injection. Multiplanar reformatted images are provided for review. MIP  images are provided for review. Automated exposure control, iterative reconstruction, and/or weight based adjustment of the mA/kV was utilized to reduce the radiation dose to as low as reasonably achievable. COMPARISON: CT angiogram of the chest dated 10/15/2014. CLINICAL HISTORY: Pulmonary embolism (PE) suspected, high prob. FINDINGS: PULMONARY ARTERIES: Pulmonary arteries are adequately opacified for evaluation. No acute pulmonary embolus. Main pulmonary artery is normal in caliber. MEDIASTINUM: The heart and pericardium demonstrate no acute abnormality. There is no acute abnormality of the thoracic aorta. LYMPH NODES: No mediastinal, hilar or axillary lymphadenopathy. LUNGS AND PLEURA: There is moderate consolidation of the left lower lobe, concerning for pneumonia. Warren Kugelman few hazy and reticular opacities are present in the dependent periphery of the right lower lobe. No evidence of pleural effusion or pneumothorax. UPPER ABDOMEN: Surgical clips are present in the gallbladder fossa. SOFT TISSUES AND BONES: No acute bone or soft tissue abnormality. IMPRESSION: 1. No evidence of pulmonary embolus. 2. Moderate consolidation of the left lower lobe, concerning for pneumonia. 3. Adriell Polansky few hazy and reticular opacities in the dependent periphery of the right lower lobe, which may represent atelectasis or additional infectious/inflammatory change. Electronically signed by: Evalene Coho MD 03/30/2024 05:28 AM EST RP Workstation: [id number]   DG Chest 2 View Result Date: 03/29/2024 EXAM: 2 VIEW(S) XRAY OF THE CHEST 03/29/2024 11:46:42 PM COMPARISON: Chest x-ray 10/07/2000. CLINICAL HISTORY: Cough/chest congestion. FINDINGS: LUNGS AND PLEURA: There are some mild patchy opacities in the left lung base. There is no pleural effusion or pneumothorax. HEART AND MEDIASTINUM: No acute abnormality of the cardiac and mediastinal silhouettes. BONES AND SOFT TISSUES: No acute osseous abnormality. IMPRESSION: 1. Mild patchy opacities in  the left lung base worrisome for infection. Recommend follow-up chest x-ray in 4 to 6 weeks to confirm resolution . 2. No pleural effusion or pneumothorax. Electronically signed by: Greig Pique MD 03/29/2024 11:53 PM EST RP Workstation: [id number]        Scheduled Meds:  amLODipine   10 mg Oral Daily   atorvastatin   10 mg Oral QHS   azithromycin   500 mg Oral Daily   enoxaparin  (LOVENOX ) injection  40 mg Subcutaneous Q24H   guaiFENesin   600 mg Oral BID   lisinopril   20 mg Oral Daily   memantine   5 mg Oral  BID   Followed by   NOREEN ON 04/10/2024] memantine   10 mg Oral BID   sodium chloride  flush  3 mL Intravenous Q12H   Continuous Infusions:  cefTRIAXone  (ROCEPHIN )  IV 2 g (03/31/24 0551)     LOS: 1 day    Time spent: over 30 min    Meliton Monte, MD [facility]   To contact the attending provider between 7A-7P or the covering provider during after hours 7P-7A, please log into the web site [email or web address] and access using universal [place] password for that web site. If you do not have the password, please call the hospital operator.  03/31/2024, 4:45 PM    "

## 2024-03-31 NOTE — Plan of Care (Signed)
" °  Problem: Health Behavior/Discharge Planning: Goal: Ability to manage health-related needs will improve Outcome: Progressing   Problem: Clinical Measurements: Goal: Will remain free from infection Outcome: Progressing   Problem: Clinical Measurements: Goal: Respiratory complications will improve Outcome: Progressing   Problem: Activity: Goal: Risk for activity intolerance will decrease Outcome: Progressing   Problem: Nutrition: Goal: Adequate nutrition will be maintained Outcome: Progressing   Problem: Coping: Goal: Level of anxiety will decrease Outcome: Progressing   Problem: Pain Managment: Goal: General experience of comfort will improve and/or be controlled Outcome: Progressing   Problem: Safety: Goal: Ability to remain free from injury will improve Outcome: Progressing   Problem: Skin Integrity: Goal: Risk for impaired skin integrity will decrease Outcome: Progressing   Problem: Activity: Goal: Ability to tolerate increased activity will improve Outcome: Progressing   "

## 2024-04-01 DIAGNOSIS — I10 Essential (primary) hypertension: Secondary | ICD-10-CM | POA: Diagnosis not present

## 2024-04-01 DIAGNOSIS — R413 Other amnesia: Secondary | ICD-10-CM | POA: Diagnosis not present

## 2024-04-01 DIAGNOSIS — I6930 Unspecified sequelae of cerebral infarction: Secondary | ICD-10-CM | POA: Diagnosis not present

## 2024-04-01 DIAGNOSIS — A419 Sepsis, unspecified organism: Secondary | ICD-10-CM | POA: Diagnosis not present

## 2024-04-01 DIAGNOSIS — J9601 Acute respiratory failure with hypoxia: Secondary | ICD-10-CM | POA: Diagnosis not present

## 2024-04-01 DIAGNOSIS — J189 Pneumonia, unspecified organism: Secondary | ICD-10-CM | POA: Diagnosis not present

## 2024-04-01 MED ORDER — GUAIFENESIN ER 600 MG PO TB12: 600.0000 mg | ORAL_TABLET | Freq: Two times a day (BID) | ORAL | 0 refills | Status: AC

## 2024-04-01 NOTE — Plan of Care (Signed)
" °  Problem: Health Behavior/Discharge Planning: Goal: Ability to manage health-related needs will improve Outcome: Progressing   Problem: Clinical Measurements: Goal: Will remain free from infection Outcome: Progressing   Problem: Activity: Goal: Risk for activity intolerance will decrease Outcome: Progressing   Problem: Nutrition: Goal: Adequate nutrition will be maintained Outcome: Progressing   Problem: Coping: Goal: Level of anxiety will decrease Outcome: Progressing   Problem: Clinical Measurements: Goal: Respiratory complications will improve Outcome: Progressing   Problem: Safety: Goal: Ability to remain free from injury will improve Outcome: Progressing   Problem: Skin Integrity: Goal: Risk for impaired skin integrity will decrease Outcome: Progressing   "

## 2024-04-01 NOTE — Discharge Summary (Signed)
 " Physician Discharge Summary   Patient: Michelle Carrillo MRN: [id number] DOB: Oct 24, 1950  Admit date:     03/29/2024  Discharge date: 04/01/2024  Discharge Physician: Michelle Carrillo   PCP: Michelle Roxan BROCKS, NP   Recommendations at discharge:   Follow-up PCP in 1 week  Discharge Diagnoses: Principal Problem:   Sepsis due to pneumonia Michelle Carrillo) Active Problems:   Acute respiratory failure with hypoxia (HCC)   Essential hypertension   Hyperlipemia   History of CVA with residual deficit   Memory loss   Prediabetes   Anxiety  Resolved Problems:   * No resolved hospital problems. *  Hospital Course:  73 y.o. mostly Spanish-speaking female with medical history significant of hypertension, hyperlipidemia, CVA, history of craniotomy, memory loss, osteoarthritis, chronic back pain, and anxiety presents with fever, cough, and congestion.  Patient's daughter is present at bedside and helps interpret.   Assessment and Plan:  Acute respiratory failure with hypoxia Sepsis secondary to pneumonia Patient presented with complaints of cough, congestion, and fever.  Noted to be febrile up to 101.8 F with tachycardia, tachypnea, and hypoxia down to 88% on room air with improvement on 2 L of nasal cannula oxygen.  Chest x-ray noted left lower lobe opacities which were confirmed to be concerning for pneumonia on CT angiogram of the chest.   Patient met sepsis criteria.  - Sepsis physiology has resolved -Blood cultures are negative to date -Urine strep pneumo antigen is negative Negative covid, flu, RSV Ceftriaxone , azithro -Will discharge on Augmentin  and doxycycline  for 7 days -She has been weaned off oxygen.  O2 sats 91 to 92% on room air after ambulation -Will need chest x-ray as outpatient in 4 weeks   Essential hypertension Continue lisinopril  and amlodipine    History of CVA with residual deficit Hyperlipidemia Patient daughter reports patient has some residual vision deficits as  well as memory issues. Continue aspirin  and statin   Memory loss Patient has some memory loss thought to be related with prior stroke.  She was recently started on medication. Delirium precaution Namenda    Prediabetes A1c 5.8   Anxiety   - Continue paroxetine        Consultants:  Procedures performed:  Disposition: Home Diet recommendation:  Regular diet DISCHARGE MEDICATION: Allergies as of 04/01/2024   No Known Allergies      Medication List     TAKE these medications    acetaminophen  500 MG tablet Commonly known as: TYLENOL  Take 1 tablet (500 mg total) by mouth every 6 (six) hours as needed.   amLODipine  10 MG tablet Commonly known as: NORVASC  Take 1 tablet (10 mg total) by mouth daily. APPOINTMENT DUE PRIOR TO ADDITIONAL REFILLS   amoxicillin -clavulanate 875-125 MG tablet Commonly known as: AUGMENTIN  Take 1 tablet by mouth every 12 (twelve) hours.   aspirin  EC 81 MG tablet Take 1 tablet (81 mg total) by mouth daily. To be taken after surgery What changed:  when to take this reasons to take this   atorvastatin  10 MG tablet Commonly known as: LIPITOR TAKE 1 TABLET AT BEDTIME. Needs an appointment before anymore future refills.   doxycycline  100 MG capsule Commonly known as: VIBRAMYCIN  Take 1 capsule (100 mg total) by mouth 2 (two) times daily.   guaiFENesin  600 MG 12 hr tablet Commonly known as: MUCINEX  Take 1 tablet (600 mg total) by mouth 2 (two) times daily for 5 days.   lisinopril  20 MG tablet Commonly known as: ZESTRIL  Take 1 tablet (20 mg total) by  mouth daily. APPOINTMENT DUE PRIOR TO ADDITIONAL REFILLS   memantine  10 MG tablet Commonly known as: Namenda  Take 1 tablet (10 mg total) by mouth 2 (two) times daily. Start 1/2 tab at night x 1 week and then twice daily x 4 weeks and then 1 tab twice daily   PARoxetine  20 MG tablet Commonly known as: PAXIL  Take 1 tablet (20 mg total) by mouth daily.        Follow-up Information      Carrillo, Michelle C, NP. Schedule an appointment as soon as possible for a visit .   Specialty: Family Medicine Why: Follow-up with your PCP in 5 days for reevaluation Contact information: [address] [place] [address] [phone]                Discharge Exam: Michelle Carrillo   03/30/24 0800  Weight: 78 kg   General-appears in no acute distress Heart-S1-S2, regular, no murmur auscultated Lungs-clear to auscultation bilaterally, no wheezing or crackles auscultated Abdomen-soft, nontender, no organomegaly Extremities-no edema in the lower extremities Neuro-alert, oriented x3, no focal deficit noted  Condition at discharge: good  The results of significant diagnostics from this hospitalization (including imaging, microbiology, ancillary and laboratory) are listed below for reference.   Imaging Studies: CT Angio Chest PE W and/or Wo Contrast Result Date: 03/30/2024 EXAM: CTA CHEST 03/30/2024 05:15:49 AM TECHNIQUE: CTA of the chest was performed without and with the administration of 75 mL of iohexol  (OMNIPAQUE ) 350 MG/ML injection. Multiplanar reformatted images are provided for review. MIP images are provided for review. Automated exposure control, iterative reconstruction, and/or weight based adjustment of the mA/kV was utilized to reduce the radiation dose to as low as reasonably achievable. COMPARISON: CT angiogram of the chest dated 10/15/2014. CLINICAL HISTORY: Pulmonary embolism (PE) suspected, high prob. FINDINGS: PULMONARY ARTERIES: Pulmonary arteries are adequately opacified for evaluation. No acute pulmonary embolus. Main pulmonary artery is normal in caliber. MEDIASTINUM: The heart and pericardium demonstrate no acute abnormality. There is no acute abnormality of the thoracic aorta. LYMPH NODES: No mediastinal, hilar or axillary lymphadenopathy. LUNGS AND PLEURA: There is moderate consolidation of the left lower lobe, concerning for pneumonia. A few hazy and reticular  opacities are present in the dependent periphery of the right lower lobe. No evidence of pleural effusion or pneumothorax. UPPER ABDOMEN: Surgical clips are present in the gallbladder fossa. SOFT TISSUES AND BONES: No acute bone or soft tissue abnormality. IMPRESSION: 1. No evidence of pulmonary embolus. 2. Moderate consolidation of the left lower lobe, concerning for pneumonia. 3. A few hazy and reticular opacities in the dependent periphery of the right lower lobe, which may represent atelectasis or additional infectious/inflammatory change. Electronically signed by: Evalene Coho MD 03/30/2024 05:28 AM EST RP Workstation: [id number]   DG Chest 2 View Result Date: 03/29/2024 EXAM: 2 VIEW(S) XRAY OF THE CHEST 03/29/2024 11:46:42 PM COMPARISON: Chest x-ray 10/07/2000. CLINICAL HISTORY: Cough/chest congestion. FINDINGS: LUNGS AND PLEURA: There are some mild patchy opacities in the left lung base. There is no pleural effusion or pneumothorax. HEART AND MEDIASTINUM: No acute abnormality of the cardiac and mediastinal silhouettes. BONES AND SOFT TISSUES: No acute osseous abnormality. IMPRESSION: 1. Mild patchy opacities in the left lung base worrisome for infection. Recommend follow-up chest x-ray in 4 to 6 weeks to confirm resolution . 2. No pleural effusion or pneumothorax. Electronically signed by: Greig Pique MD 03/29/2024 11:53 PM EST RP Workstation: [id number]   EEG adult       [facility]  [address] [place]. [place] [address] [phone]      Electroencephalogram Procedure Note Ms. Hadassah FORBES Fells Date of Birth:  04-05-1951 Medical Record Number:  [id number] Indications: Diagnostic Date of Procedure 03/14/2024 Medications: none Clinical history :  62 year patient with memory loss Technical Description This study was performed using 17 channel digital electroencephalographic recording equipment. International 10-20 electrode placement was used. The record was obtained with the  patient awake, drowsy, and asleep.  The record is of good technical quality for purposes of interpretation. Activation Procedures:  hyperventilation and photic stimulation . EEG Description Awake: Alpha Activity: The waking state record contains a well-defined bi-occipital alpha rhythm of  moderate amplitude with a dominant frequency of 10 Hz. Reactivity is present. No paroxsymal activity, spikes, or sharp waves are noted. Technical component of study is adequate. EKG tracing shows regular sinus rhythmn Length of this recording is  28 mins and 46 seconds Sleep: With drowsiness, there is attenuation of the background alpha activity. As the patient enters into light sleep, vertex waves and symmetrical spindles are noted. K complexes are noted in sleep. Transition to the waking state is unremarkable. Result of Activation Procedures: Hyperventilation: Hyperventilation for three minutes fails to activate the recording. Photo Stimulation: No photic driving response is noted. Summary Normal electroencephalogram, awake, asleep and with activation procedures. There are no focal lateralizing or epileptiform features.    Microbiology: Results for orders placed or performed during the hospital encounter of 03/29/24  Resp panel by RT-PCR (RSV, Flu A&B, Covid) Anterior Nasal Swab     Status: None   Collection Time: 03/29/24 11:21 PM   Specimen: Anterior Nasal Swab  Result Value Ref Range Status   SARS Coronavirus 2 by RT PCR NEGATIVE NEGATIVE Final   Influenza A by PCR NEGATIVE NEGATIVE Final   Influenza B by PCR NEGATIVE NEGATIVE Final    Comment: (NOTE) The Xpert Xpress SARS-CoV-2/FLU/RSV plus assay is intended as an aid in the diagnosis of influenza from Nasopharyngeal swab specimens and should not be used as a sole basis for treatment. Nasal washings and aspirates are unacceptable for Xpert Xpress SARS-CoV-2/FLU/RSV testing.  Fact Sheet for Patients: bloggercourse.com  Fact Sheet  for Healthcare Providers: seriousbroker.it  This test is not yet approved or cleared by the [place]  FDA and has been authorized for detection and/or diagnosis of SARS-CoV-2 by FDA under an Emergency Use Authorization (EUA). This EUA will remain in effect (meaning this test can be used) for the duration of the COVID-19 declaration under Section 564(b)(1) of the Act, 21 U.S.C. section 360bbb-3(b)(1), unless the authorization is terminated or revoked.     Resp Syncytial Virus by PCR NEGATIVE NEGATIVE Final    Comment: (NOTE) Fact Sheet for Patients: bloggercourse.com  Fact Sheet for Healthcare Providers: seriousbroker.it  This test is not yet approved or cleared by the [place]  FDA and has been authorized for detection and/or diagnosis of SARS-CoV-2 by FDA under an Emergency Use Authorization (EUA). This EUA will remain in effect (meaning this test can be used) for the duration of the COVID-19 declaration under Section 564(b)(1) of the Act, 21 U.S.C. section 360bbb-3(b)(1), unless the authorization is terminated or revoked.  Performed at [facility], [address] [place], [place] [address]   Culture, blood (single) w Reflex to ID Panel     Status: None (Preliminary result)   Collection Time: 03/30/24  8:06 AM   Specimen: BLOOD  Result Value Ref Range Status   Specimen Description BLOOD  SITE NOT SPECIFIED  Final   Special Requests   Final    BOTTLES DRAWN AEROBIC AND ANAEROBIC Blood Culture adequate volume   Culture   Final    NO GROWTH 2 DAYS Performed at [facility], [address] [place], [place] [address]    Report Status PENDING  Incomplete    Labs: CBC: Recent Labs  Lab 03/29/24 2326 03/31/24 0600  WBC 6.2 5.4  NEUTROABS 4.7  --   HGB 14.1 12.0  HCT 41.3 35.4*  MCV 92.8 91.5  PLT 216 187   Basic Metabolic Panel: Recent Labs  Lab 03/29/24 2326  03/31/24 0600  NA 135 139  K 4.2 3.9  CL 98 104  CO2 24 27  GLUCOSE 162* 106*  BUN 14 12  CREATININE 0.64 0.58  CALCIUM  8.8* 8.2*   Liver Function Tests: Recent Labs  Lab 03/31/24 0600  AST 30  ALT 18  ALKPHOS 86  BILITOT 0.3  PROT 6.4*  ALBUMIN 3.3*   CBG: No results for input(s): GLUCAP in the last 168 hours.  Discharge time spent: greater than 30 minutes.  Signed: Sabas GORMAN Brod, MD [facility] 04/01/2024 "

## 2024-04-01 NOTE — Progress Notes (Addendum)
 SATURATION QUALIFICATIONS: (This note is used to comply with regulatory documentation for home oxygen)  Patient Saturations on Room Air at Rest = 92-93%  Patient Saturations on Room Air while Ambulating = 89%  Patient Saturations on 1 Liters of oxygen while Ambulating = 91%  Please briefly explain why patient needs home oxygen: with activity She would drop to 83% with 1L of O2   Kept in Room air for more than and SPO2 was between 92-93%, ambulated in the hallway for and SPO2 was drop to 89% without O2 and with O2 it stays at 91-92%.

## 2024-04-01 NOTE — Progress Notes (Incomplete)
 [facility]  PROGRESS NOTE  Michelle Carrillo FMW:[id number] DOB: 1950-10-22 DOA: 03/29/2024 PCP: Leonarda Roxan BROCKS, NP   Brief HPI:    73 y.o. mostly Spanish-speaking female with medical history significant of hypertension, hyperlipidemia, CVA, history of craniotomy, memory loss, osteoarthritis, chronic back pain, and anxiety presents with fever, cough, and congestion.  Patient's daughter is present at bedside and helps interpret.       Assessment/Plan:   Acute respiratory failure with hypoxia Sepsis secondary to pneumonia Patient presented with complaints of cough, congestion, and fever.  Noted to be febrile up to 101.8 F with tachycardia, tachypnea, and hypoxia down to 88% on room air with improvement on 2 L of nasal cannula oxygen.  Chest x-ray noted left lower lobe opacities which were confirmed to be concerning for pneumonia on CT angiogram of the chest.   Patient met sepsis criteria.  Blood culture  Sputum culture Urine strep negative.  Legionella pending. Negative covid, flu, RSV Ceftriaxone , azithro   Essential hypertension Continue lisinopril  and amlodipine    History of CVA with residual deficit Hyperlipidemia Patient daughter reports patient has some residual vision deficits as well as memory issues. Continue aspirin  and statin   Memory loss Patient has some memory loss thought to be related with prior stroke.  She was recently started on medication. Delirium precaution Namenda    Prediabetes A1c 5.8   Anxiety   - Continue paroxetine    Obesity, Class 1 Body mass index is 34.74 kg/m.      DVT prophylaxis: ***  Medications     amLODipine   10 mg Oral Daily   aspirin  EC  81 mg Oral Daily   atorvastatin   10 mg Oral QHS   azithromycin   500 mg Oral Daily   enoxaparin  (LOVENOX ) injection  40 mg Subcutaneous Q24H   guaiFENesin   600 mg Oral BID   lisinopril   20 mg Oral Daily   memantine   5 mg Oral BID   Followed by   NOREEN ON 04/10/2024]  memantine   10 mg Oral BID   sodium chloride  flush  3 mL Intravenous Q12H     Data Reviewed:   CBG:  No results for input(s): GLUCAP in the last 168 hours.  SpO2: 94 % O2 Flow Rate (L/min): 1 L/min    Vitals:   03/31/24 1553 03/31/24 1959 03/31/24 2341 04/01/24 0358  BP: 126/67 120/65 124/61 123/75  Pulse:  80 71 70  Resp: 18 18 18 18   Temp: 97.7 F (36.5 C) 99.6 F (37.6 C) 98.6 F (37 C) 98.7 F (37.1 C)  TempSrc: Oral Oral Oral Oral  SpO2: 94% 93% 96% 94%  Weight:      Height:          Data Reviewed:  Basic Metabolic Panel: Recent Labs  Lab 03/29/24 2326 03/31/24 0600  NA 135 139  K 4.2 3.9  CL 98 104  CO2 24 27  GLUCOSE 162* 106*  BUN 14 12  CREATININE 0.64 0.58  CALCIUM  8.8* 8.2*    CBC: Recent Labs  Lab 03/29/24 2326 03/31/24 0600  WBC 6.2 5.4  NEUTROABS 4.7  --   HGB 14.1 12.0  HCT 41.3 35.4*  MCV 92.8 91.5  PLT 216 187    LFT Recent Labs  Lab 03/31/24 0600  AST 30  ALT 18  ALKPHOS 86  BILITOT 0.3  PROT 6.4*  ALBUMIN 3.3*     Antibiotics: Anti-infectives (From admission, onward)    Start     Dose/Rate Route Frequency Ordered  Stop   03/31/24 1000  azithromycin  (ZITHROMAX ) tablet 500 mg        500 mg Oral Daily 03/30/24 0741 04/04/24 0959   03/31/24 0600  cefTRIAXone  (ROCEPHIN ) 2 g in sodium chloride  0.9 % 100 mL IVPB        2 g 200 mL/hr over 30 Minutes Intravenous Every 24 hours 03/30/24 0741 04/04/24 0559   03/30/24 0745  cefTRIAXone  (ROCEPHIN ) 1 g in sodium chloride  0.9 % 100 mL IVPB        1 g 200 mL/hr over 30 Minutes Intravenous  Once 03/30/24 0741 03/30/24 0942   03/30/24 0315  cefTRIAXone  (ROCEPHIN ) 1 g in sodium chloride  0.9 % 100 mL IVPB        1 g 200 mL/hr over 30 Minutes Intravenous  Once 03/30/24 0304 03/30/24 0427   03/30/24 0315  azithromycin  (ZITHROMAX ) 500 mg in sodium chloride  0.9 % 250 mL IVPB        500 mg 250 mL/hr over 60 Minutes Intravenous  Once 03/30/24 0304 03/30/24 0623   03/30/24 0000   amoxicillin -clavulanate (AUGMENTIN ) 875-125 MG tablet        1 tablet Oral Every 12 hours 03/30/24 0623     03/30/24 0000  doxycycline  (VIBRAMYCIN ) 100 MG capsule        100 mg Oral 2 times daily 03/30/24 9376          CONSULTS ***  Code Status: ***  Family Communication: ***     Subjective   ***   Objective    Physical Examination:   General:  *** Cardiovascular: *** Respiratory: *** Abdomen: *** Extremities: ***  Neurologic:  ***             Michelle Carrillo   [facility] If 7PM-7AM, please contact night-coverage at [email or web address], Office  [phone]   04/01/2024, 10:21 AM  LOS: 2 days

## 2024-04-02 ENCOUNTER — Telehealth: Payer: Self-pay

## 2024-04-02 LAB — LEGIONELLA PNEUMOPHILA SEROGP 1 UR AG: L. pneumophila Serogp 1 Ur Ag: NEGATIVE

## 2024-04-02 NOTE — Transitions of Care (Post Inpatient/ED Visit) (Signed)
" ° °  04/02/2024  Name: Michelle Carrillo MRN: [id number] DOB: 08/04/1950  Today's TOC FU Call Status: Today's TOC FU Call Status:: Unsuccessful Call (1st Attempt) Unsuccessful Call (1st Attempt) Date: 04/02/24  Attempted to reach the patient regarding the most recent Inpatient/ED visit.  Follow Up Plan: Additional outreach attempts will be made to reach the patient to complete the Transitions of Care (Post Inpatient/ED visit) call.   Shona Prow RN, CCM [place]  VBCI-Population Health RN Care Manager [phone]  "

## 2024-04-03 ENCOUNTER — Telehealth: Payer: Self-pay

## 2024-04-03 NOTE — Transitions of Care (Post Inpatient/ED Visit) (Signed)
" ° °  04/03/2024  Name: Michelle Carrillo MRN: [id number] DOB: [birth year]  Today's TOC FU Call Status: Today's TOC FU Call Status:: Unsuccessful Call (3rd Attempt) Unsuccessful Call (3rd Attempt) Date: 04/03/24  Attempted to reach the patient regarding the most recent Inpatient/ED visit.  Follow Up Plan: No further outreach attempts will be made at this time. We have been unable to contact the patient.  Shona Prow RN, CCM [place]  VBCI-Population Health RN Care Manager [phone]  "

## 2024-04-03 NOTE — Transitions of Care (Post Inpatient/ED Visit) (Signed)
" ° °  04/03/2024  Name: ERIYANA SWEETEN MRN: [id number] DOB: [birth year]  Today's TOC FU Call Status: Today's TOC FU Call Status:: Unsuccessful Call (2nd Attempt) Unsuccessful Call (2nd Attempt) Date: 04/03/24  Attempted to reach the patient regarding the most recent Inpatient/ED visit.  Follow Up Plan: Additional outreach attempts will be made to reach the patient to complete the Transitions of Care (Post Inpatient/ED visit) call.   Shona Prow RN, CCM [place]  VBCI-Population Health RN Care Manager [phone]  "

## 2024-04-04 LAB — CULTURE, BLOOD (SINGLE)
Culture: NO GROWTH
Special Requests: ADEQUATE

## 2024-04-08 ENCOUNTER — Ambulatory Visit
Admission: RE | Admit: 2024-04-08 | Discharge: 2024-04-08 | Disposition: A | Source: Ambulatory Visit | Attending: Neurology | Admitting: Neurology

## 2024-04-08 DIAGNOSIS — R413 Other amnesia: Secondary | ICD-10-CM | POA: Diagnosis not present

## 2024-04-08 MED ORDER — GADOPICLENOL 0.5 MMOL/ML IV SOLN
9.0000 mL | Freq: Once | INTRAVENOUS | Status: AC | PRN
  Administered 2024-04-08: 9 mL via INTRAVENOUS

## 2024-04-16 DIAGNOSIS — I10 Essential (primary) hypertension: Secondary | ICD-10-CM

## 2024-04-16 MED ORDER — AMLODIPINE BESYLATE 10 MG PO TABS: 10.0000 mg | ORAL_TABLET | Freq: Every day | ORAL | 1 refills | Status: DC

## 2024-04-16 MED ORDER — MEMANTINE HCL 10 MG PO TABS: 10.0000 mg | ORAL_TABLET | Freq: Two times a day (BID) | ORAL | 1 refills | Status: DC

## 2024-04-16 MED ORDER — LISINOPRIL 20 MG PO TABS: 20.0000 mg | ORAL_TABLET | Freq: Every day | ORAL | 1 refills | Status: DC

## 2024-04-16 MED ORDER — ATORVASTATIN CALCIUM 10 MG PO TABS: ORAL_TABLET | ORAL | 1 refills | Status: DC

## 2024-04-30 ENCOUNTER — Ambulatory Visit: Admitting: Neurology

## 2024-05-01 ENCOUNTER — Encounter: Admitting: Family

## 2024-05-03 ENCOUNTER — Encounter: Payer: Self-pay | Admitting: Pharmacist

## 2024-05-03 NOTE — Progress Notes (Signed)
" ° °  05/03/2024 Name: Michelle Carrillo MRN: [id number] DOB: 04/16/1950  Chief Complaint  Patient presents with   Medication Adherence    Atorvastatin    Pharmacy Quality Measure Review  This patient is appearing on a report for being at risk of failing the adherence measure for cholesterol (statin) medications this calendar year.   Medication: Atorvastatin  10 mg Last fill date: 04/17/24 for 30 day supply  Will collaborate with provider to facilitate refill needs.  Patient has an appointment tomorrow. Will message provider about filling a 100 day supply.  [name], PharmD, BCACP Clinical Pharmacist [phone]   "

## 2024-05-04 ENCOUNTER — Encounter: Payer: Self-pay | Admitting: Family

## 2024-05-04 ENCOUNTER — Ambulatory Visit: Admitting: Family

## 2024-05-04 VITALS — BP 118/72 | HR 73 | Temp 97.6°F | Resp 20 | Ht 59.0 in | Wt 169.6 lb

## 2024-05-04 DIAGNOSIS — R5383 Other fatigue: Secondary | ICD-10-CM | POA: Diagnosis not present

## 2024-05-04 DIAGNOSIS — H938X3 Other specified disorders of ear, bilateral: Secondary | ICD-10-CM | POA: Diagnosis not present

## 2024-05-04 DIAGNOSIS — R300 Dysuria: Secondary | ICD-10-CM | POA: Diagnosis not present

## 2024-05-04 DIAGNOSIS — F419 Anxiety disorder, unspecified: Secondary | ICD-10-CM | POA: Diagnosis not present

## 2024-05-04 DIAGNOSIS — B379 Candidiasis, unspecified: Secondary | ICD-10-CM | POA: Diagnosis not present

## 2024-05-04 DIAGNOSIS — E782 Mixed hyperlipidemia: Secondary | ICD-10-CM | POA: Diagnosis not present

## 2024-05-04 DIAGNOSIS — R350 Frequency of micturition: Secondary | ICD-10-CM

## 2024-05-04 DIAGNOSIS — F32A Depression, unspecified: Secondary | ICD-10-CM | POA: Diagnosis not present

## 2024-05-04 DIAGNOSIS — F0390 Unspecified dementia without behavioral disturbance: Secondary | ICD-10-CM | POA: Diagnosis not present

## 2024-05-04 DIAGNOSIS — I10 Essential (primary) hypertension: Secondary | ICD-10-CM

## 2024-05-04 LAB — POCT URINALYSIS DIPSTICK
Bilirubin, UA: NEGATIVE
Blood, UA: NEGATIVE
Glucose, UA: NEGATIVE
Ketones, UA: NEGATIVE
Nitrite, UA: NEGATIVE
Protein, UA: POSITIVE — AB
Spec Grav, UA: 1.015
Urobilinogen, UA: 1 U/dL
pH, UA: 6

## 2024-05-04 MED ORDER — MEMANTINE HCL 10 MG PO TABS: 10.0000 mg | ORAL_TABLET | Freq: Every day | ORAL | 1 refills | Status: AC

## 2024-05-04 MED ORDER — NYSTATIN 100000 UNIT/GM EX CREA: 1.0000 | TOPICAL_CREAM | Freq: Two times a day (BID) | CUTANEOUS | 0 refills | Status: AC

## 2024-05-04 MED ORDER — PAROXETINE HCL 20 MG PO TABS: 20.0000 mg | ORAL_TABLET | Freq: Every day | ORAL | 1 refills | Status: AC

## 2024-05-04 MED ORDER — ATORVASTATIN CALCIUM 10 MG PO TABS: ORAL_TABLET | ORAL | 1 refills | Status: AC

## 2024-05-04 MED ORDER — AMLODIPINE BESYLATE 10 MG PO TABS: 10.0000 mg | ORAL_TABLET | Freq: Every day | ORAL | 1 refills | Status: AC

## 2024-05-04 MED ORDER — LORATADINE 10 MG PO TABS: 10.0000 mg | ORAL_TABLET | Freq: Every day | ORAL | 11 refills | Status: AC

## 2024-05-04 MED ORDER — LISINOPRIL 20 MG PO TABS: 20.0000 mg | ORAL_TABLET | Freq: Every day | ORAL | 1 refills | Status: AC

## 2024-05-04 NOTE — Progress Notes (Unsigned)
 "  Provider: Jakera Beaupre FNP-C   Marquelle Musgrave, Roxan BROCKS, NP  Patient Care Team: Xai Frerking, Roxan BROCKS, NP as PCP - General (Family Medicine)  Extended Emergency Contact Information Primary Emergency Contact: Jabier Eda [place], [place] [address] [place] Phone: [phone] Relation: Daughter Secondary Emergency Contact: Jackson Dama Address: [address]          [place], [place] [address] [place] Home Phone: [phone] Mobile Phone: [phone] Relation: Spouse  Code Status:  Full Code  Goals of care: Advanced Directive information    05/04/2024   11:00 AM  Advanced Directives  Does Patient Have a Medical Advance Directive? No  Would patient like information on creating a medical advance directive? No - Patient declined     Chief Complaint  Patient presents with   Not feeling well    Pt c/o of being really tired and fatigued and ears feel clogged up.Patient feels weak.    History of Present Illness   Michelle Carrillo is a 74 year old female with hypertension and Alzheimer's disease who presents with medication management issues and persistent fatigue.  She has been experiencing persistent fatigue, weakness, and a sensation of clogged ears, which began after a trip to [place] where she had difficulty managing her medications. She feels very weak, unable to get out of bed, and experiences a fast heartbeat. Her daughter notes that she has been feeling unwell both before and after the trip.  She is currently on multiple medications including lisinopril  20 mg daily, amlodipine  10 mg daily, aspirin  81 mg daily, atorvastatin  10 mg, and paroxetine . There was confusion regarding the timing of her medications, leading to all being administered in the morning to simplify her regimen. However, she continues to feel unwell.  Her daughter reports that she has been taking memantine  only once daily in the morning instead of the  prescribed twice daily due to difficulties with nighttime dosing. She has also been taking paroxetine  for anxiety and depression, although her daughter feels she is not depressed but rather physically unwell.  She has a history of pneumonia in December 2025, and her daughter mentions that she did not follow up after the pneumonia resolved. She also reports feeling her ears are clogged, which has been persistent for a while. No nasal congestion or runny nose.  Her daughter is concerned about her and exercise.  Her recent lab work showed low protein and calcium  levels. Her A1c was 5.8 improved from 6.0   Her social history includes living with her family. Her brother and father assist with her care when she is in [place].   Past Medical History:  Diagnosis Date   Anxiety    on meds   Arthritis    bilateral knees   Cataract    RIGHT eye   Depression    on meds   Healthcare maintenance 06/20/2019   Herpes zoster kerstitis s/p corneal transplant 2015    History of strabismus surgery 03/31/2019   03/31/2019 1. Left medial rectus recession 6.67mm to11.41mm from limbus 2. Left medial rectus adjustable suture 3. Left inferior rectus 5.27mm central tenotomy 4. Left inferior rectus adjustable suture  Last Assessment & Plan:  03/31/2019 1. Left medial rectus recession 6.75mm to11.51mm from limbus 2. Left medial rectus adjustable suture 3. Left inferior rectus 5.41mm central tenotomy 4. Left inferior r   Hypertension    on meds   Right knee osteoarthritis  Status post total right knee replacement 10/10/2020   Strabismus    due to eye injury as a child   Stroke (HCC) 07/2019   stroke with craniotomy   Past Surgical History:  Procedure Laterality Date   CESAREAN SECTION      x 2    CHOLECYSTECTOMY     COLONOSCOPY  04/22/2020   CORNEAL TRANSPLANT Left    2006 and 2016   CRANIOTOMY Left 08/09/2019   Procedure: CRANIOTOMY HEMATOMA EVACUATION SUBDURAL;  Surgeon: Gillie Duncans, MD;  Location: [facility] OR;   Service: Neurosurgery;  Laterality: Left;  left   EYE SURGERY     TOTAL KNEE ARTHROPLASTY Right 10/10/2020   Procedure: RIGHT TOTAL KNEE ARTHROPLASTY;  Surgeon: Jerri Kay HERO, MD;  Location: [facility] OR;  Service: Orthopedics;  Laterality: Right;   TUBAL LIGATION      Allergies[1]  Allergies as of 05/04/2024   No Known Allergies      Medication List        Accurate as of May 04, 2024 12:31 PM. If you have any questions, ask your nurse or doctor.          STOP taking these medications    amoxicillin -clavulanate 875-125 MG tablet Commonly known as: AUGMENTIN  Stopped by: Roxan Plough, NP       TAKE these medications    acetaminophen  500 MG tablet Commonly known as: TYLENOL  Take 1 tablet (500 mg total) by mouth every 6 (six) hours as needed.   amLODipine  10 MG tablet Commonly known as: NORVASC  Take 1 tablet (10 mg total) by mouth daily.   aspirin  EC 81 MG tablet Take 1 tablet (81 mg total) by mouth daily. To be taken after surgery What changed:  when to take this reasons to take this   atorvastatin  10 MG tablet Commonly known as: LIPITOR TAKE 1 TABLET AT BEDTIME.   doxycycline  100 MG capsule Commonly known as: VIBRAMYCIN  Take 1 capsule (100 mg total) by mouth 2 (two) times daily.   lisinopril  20 MG tablet Commonly known as: ZESTRIL  Take 1 tablet (20 mg total) by mouth daily.   loratadine  10 MG tablet Commonly known as: CLARITIN  Take 1 tablet (10 mg total) by mouth daily. Started by: Kacin Dancy, NP   memantine  10 MG tablet Commonly known as: Namenda  Take 1 tablet (10 mg total) by mouth daily. What changed: when to take this Changed by: Roxan Plough, NP   nystatin  cream Commonly known as: MYCOSTATIN  Apply 1 Application topically 2 (two) times daily. Started by: Forest Pruden, NP   PARoxetine  20 MG tablet Commonly known as: PAXIL  Take 1 tablet (20 mg total) by mouth daily.        Review of Systems  Constitutional:  Positive for fatigue.  Negative for appetite change, chills, fever and unexpected weight change.  HENT:  Negative for congestion, dental problem, ear discharge, ear pain, facial swelling, hearing loss, nosebleeds, postnasal drip, rhinorrhea, sinus pressure, sinus pain, sneezing, sore throat, tinnitus and trouble swallowing.        Ear fullness   Eyes:  Negative for pain, discharge, redness, itching and visual disturbance.  Respiratory:  Negative for cough, chest tightness, shortness of breath and wheezing.   Cardiovascular:  Negative for chest pain, palpitations and leg swelling.  Gastrointestinal:  Negative for abdominal distention, abdominal pain, blood in stool, constipation, diarrhea, nausea and vomiting.  Endocrine: Negative for cold intolerance, heat intolerance, polydipsia, polyphagia and polyuria.  Genitourinary:  Positive for frequency. Negative for difficulty urinating, dysuria, flank  pain and urgency.  Musculoskeletal:  Positive for back pain. Negative for arthralgias, gait problem, joint swelling, myalgias, neck pain and neck stiffness.  Skin:  Negative for color change, pallor, rash and wound.  Neurological:  Negative for dizziness, syncope, speech difficulty, weakness, light-headedness, numbness and headaches.  Hematological:  Does not bruise/bleed easily.  Psychiatric/Behavioral:  Negative for agitation, behavioral problems, confusion, hallucinations, self-injury, sleep disturbance and suicidal ideas. The patient is not nervous/anxious.        Memory loss  Depression     Immunization History  Administered Date(s) Administered   INFLUENZA, HIGH DOSE SEASONAL PF 12/27/2023   Influenza,inj,Quad PF,6+ Mos 01/09/2020   Moderna Sars-Covid-2 Vaccination 06/14/2019, 07/17/2019   PNEUMOCOCCAL CONJUGATE-20 07/01/2022   Pneumococcal Conjugate-13 04/23/2020   Pertinent  Health Maintenance Due  Topic Date Due   Colonoscopy  04/23/2023   Mammogram  01/03/2026   Influenza Vaccine  Completed   Bone Density  Scan  Completed      01/04/2022    2:26 PM 07/01/2022   11:28 AM 08/19/2022    3:19 PM 12/27/2023   11:47 AM 05/04/2024   10:59 AM  Fall Risk  Falls in the past year? 0 0 0 0 0  Was there an injury with Fall? 0  0  0  0  0  Fall Risk Category Calculator 0 0 0 0 0  Fall Risk Category (Retired) Low       (RETIRED) Patient Fall Risk Level Low fall risk       Patient at Risk for Falls Due to No Fall Risks  No Fall Risks No Fall Risks No Fall Risks  Fall risk Follow up Falls evaluation completed   Falls evaluation completed Falls evaluation completed Falls evaluation completed     Data saved with a previous flowsheet row definition   Functional Status Survey:    Vitals:   05/04/24 1101  BP: 118/72  Pulse: 73  Resp: 20  Temp: 97.6 F (36.4 C)  SpO2: 96%  Weight: 169 lb 9.6 oz (76.9 kg)  Height: 4' 11 (1.499 m)   Body mass index is 34.26 kg/m. Physical Exam  GENERAL: Alert, cooperative, well developed, no acute distress. HEENT: Normocephalic, normal oropharynx, moist mucous membranes. Fluid in left ear, no infection. No nasal discharge. Extraocular movements intact. No frontal sinus tenderness. CHEST: Clear to auscultation bilaterally. No wheezes, rhonchi, or crackles. CARDIOVASCULAR: Normal heart rate and rhythm, S1 and S2 normal without murmurs. ABDOMEN: Soft, non-tender, non-distended, without organomegaly. Normal bowel sounds. EXTREMITIES: No cyanosis or edema. NEUROLOGICAL: Cranial nerves grossly intact, moves all extremities without gross motor or sensory deficit.  SKIN: No rash,no lesion or erythema   PSYCHIATRY/BEHAVIORAL: Mood stable memory loss okay yeah so you smell really that you go to the same frame in the bath water  all you okay alright I am  Labs reviewed: Recent Labs    01/04/24 0912 03/29/24 2326 03/31/24 0600  NA 141 135 139  K 4.3 4.2 3.9  CL 106 98 104  CO2 27 24 27   GLUCOSE 99 162* 106*  BUN 15 14 12   CREATININE 0.59* 0.64 0.58  CALCIUM  8.9 8.8*  8.2*   Recent Labs    01/04/24 0912 03/31/24 0600  AST 19 30  ALT 13 18  ALKPHOS  --  86  BILITOT 0.5 0.3  PROT 7.2 6.4*  ALBUMIN  --  3.3*   Recent Labs    01/04/24 0912 03/29/24 2326 03/31/24 0600  WBC 7.1 6.2 5.4  NEUTROABS  4,210 4.7  --   HGB 13.5 14.1 12.0  HCT 40.3 41.3 35.4*  MCV 92.9 92.8 91.5  PLT 277 216 187   Lab Results  Component Value Date   TSH 3.320 03/05/2024   Lab Results  Component Value Date   HGBA1C 5.8 (H) 03/31/2024   Lab Results  Component Value Date   CHOL 119 01/04/2024   HDL 57 01/04/2024   LDLCALC 45 01/04/2024   TRIG 89 01/04/2024   CHOLHDL 2.1 01/04/2024    Significant Diagnostic Results in last 30 days:  MR BRAIN W WO CONTRAST Result Date: 04/09/2024  [facility] [address], [address] [place], [place] [address] [phone] NEUROIMAGING REPORT STUDY DATE: 04/08/2024 PATIENT NAME: Michelle Carrillo DOB: 06-29-1950 MRN: [id number] ORDERING CLINICIAN: Dr. Rosemarie CLINICAL HISTORY: 74 year old patient being evaluated for memory loss COMPARISON FILMS: MRI scan of the brain without contrast 07/13/2020 EXAM: MRI brain with and without contrast TECHNIQUE: Sagittal T1, axial T1, T2, FLAIR, DWI, ADC map, SWI, coronal T2 and postcontrast axial and coronal T1 images were obtained through the brain. CONTRAST: 9 mL IV gadopiclenol  IMAGING SITE: [facility] FINDINGS: The brain parenchyma shows stable appearance of remote age left parieto-occipital hemorrhagic lesion with cystic encephalomalacia and gliosis in remote age hemosiderin changes as well as a new area of remote age right parieto-occipital hemorrhage which appears new since previous MRI from 07/13/2020.  There are mild age-related changes of chronic small vessel disease and age-appropriate mild generalized cerebral atrophy.  Diffusion weighted imaging negative for acute ischemia.  Subarachnoid spaces and ventricular system appear normal.  Cortical sulci and gyri show normal  appearance except as described above.  Calvarium shows no abnormalities.  Orbits appear unremarkable.  Paranasal sinuses show mild chronic mucosal thickening.  The pituitary gland and cerebellar tonsils appear normal.  Visualized portion of the cervical spine show mild degenerative changes at craniovertebral junction and upper cervical spine.  Flow-voids of large vessels or intracranial circulation appear to be patent.  Postcontrast images show no significant areas of abnormal enhancement.  MRI is   MRI scan of the brain with and without contrast showing remote age area of cystic encephalomalacia with hemorrhagic blood products in the left parieto-occipital as well as new interval area also of remote age hemorrhage in the right parietal occipital region as well which appears new compared with previous MRI from 2022.  There are mild age-related changes of chronic small vessel disease and generalized cerebral atrophy. INTERPRETING PHYSICIAN: EATHER ROSEMARIE, MD Certified in  Neuroimaging by American Society of Neuroimaging and [employer]   Assessment/Plan  This Medication regimen complexity and optimization Experiencing difficulty managing medication regimen, especially when traveling. Currently taking multiple medications, including lisinopril , amlodipine , atorvastatin , aspirin , Namenda , and paroxetine . Concerns about medication timing and adherence, particularly with atorvastatin  and Namenda . Atorvastatin  is crucial for cholesterol management and stroke prevention, and Namenda  is important for dementia management. - Changed Namenda  to 10 mg once daily in the morning. - Ensured atorvastatin  is taken with supper, ideally between 5-6 PM. - Provided a 90-day supply with one refill for all medications. - Referred to a geriatric psychiatrist for evaluation of depression and anxiety management.  Essential hypertension Blood pressure management with lisinopril  and amlodipine .  Current regimen appears effective, but adherence is a concern due to medication timing issues. - Continue lisinopril  20 mg daily. - Continue amlodipine  10 mg daily.  Mixed hyperlipidemia Managed with atorvastatin . Concerns about discontinuation due to adherence issues, but risks of  discontinuation include increased cholesterol levels and potential stroke. - Continue atorvastatin  10 mg with supper. - Encouraged dietary modifications to reduce cholesterol intake.  Major neurocognitive disorder (dementia) Dementia managed with Namenda . Current regimen adjusted to once daily dosing due to adherence issues. No significant behavioral issues reported. - Changed Namenda  to 10 mg once daily in the morning.  Depression and anxiety Paroxetine  is being taken. Concerns about whether symptoms are due to depression or dementia. Referral to a psychiatrist for further evaluation and management. - Referred to a geriatric psychiatrist for evaluation of depression and anxiety management.  Candidiasis of the skin Candidiasis in the umbilicus, likely due to moisture retention. No significant discharge or drainage noted. - Apply nystatin  cream twice daily to affected area. - Ensure area is dried thoroughly after bathing.  Ear fullness Reports of ear fullness possible Airplane flying from [place]   - Recommended over-the-counter Claritin  for 5 days to alleviate ear fullness.  Fatigue Persistent fatigue, possibly related to depression, medication regimen, nutritional status, or underlying conditions. Recent lab work showed low protein and calcium  levels. - Encouraged increased protein intake or use of nutritional supplements like Ensure. - Encouraged regular exercise to improve energy levels.  Urinary frequency Reports of urinary frequency, particularly at night. No signs of urinary tract infection on recent evaluation. - Obtained urine specimen to rule out urinary tract infection.  Nutritional  deficiency Recent lab work indicated low protein and calcium  levels, suggesting nutritional deficiencies. - Encouraged increased protein intake or use of nutritional supplements like Ensure. - Will monitor calcium  levels and consider supplementation if necessary.   Family/ staff Communication: Reviewed plan of care with patient verbalized understanding   Labs/tests ordered:  - Urine Culture; Future - POC Urinalysis Dipstick - CBC/diff - Vitamin B 12   Next Appointment : Return if symptoms worsen or fail to improve.   Spent 30 minutes of Face to face and non-face to face with patient  >50% time spent counseling; reviewing medical record; tests; labs; documentation and developing future plan of care.   Roxan BROCKS Lexandra Rettke, NP      [1] No Known Allergies  "

## 2024-05-05 LAB — URINE CULTURE
MICRO NUMBER:: 17510115
SPECIMEN QUALITY:: ADEQUATE

## 2024-05-05 LAB — CBC WITH DIFFERENTIAL/PLATELET
Absolute Lymphocytes: 1320 {cells}/uL (ref 850–3900)
Absolute Monocytes: 332 {cells}/uL (ref 200–950)
Basophils Absolute: 33 {cells}/uL (ref 0–200)
Basophils Relative: 0.5 %
Eosinophils Absolute: 91 {cells}/uL (ref 15–500)
Eosinophils Relative: 1.4 %
HCT: 38.1 % (ref 35.9–46.0)
Hemoglobin: 12.6 g/dL (ref 11.7–15.5)
MCH: 31.1 pg (ref 27.0–33.0)
MCHC: 33.1 g/dL (ref 31.6–35.4)
MCV: 94.1 fL (ref 81.4–101.7)
MPV: 10.2 fL (ref 7.5–12.5)
Monocytes Relative: 5.1 %
Neutro Abs: 4726 {cells}/uL (ref 1500–7800)
Neutrophils Relative %: 72.7 %
Platelets: 299 10*3/uL (ref 140–400)
RBC: 4.05 Million/uL (ref 3.80–5.10)
RDW: 13.2 % (ref 11.0–15.0)
Total Lymphocyte: 20.3 %
WBC: 6.5 10*3/uL (ref 3.8–10.8)

## 2024-05-05 LAB — VITAMIN B12: Vitamin B-12: 750 pg/mL (ref 200–1100)

## 2024-05-06 NOTE — Progress Notes (Signed)
   This encounter was created in error - please disregard. No show

## 2024-05-09 ENCOUNTER — Encounter: Payer: Self-pay | Admitting: Physical Medicine and Rehabilitation

## 2024-05-09 ENCOUNTER — Ambulatory Visit: Payer: Self-pay | Admitting: Family

## 2024-05-18 ENCOUNTER — Other Ambulatory Visit: Payer: Self-pay | Admitting: Physical Medicine and Rehabilitation

## 2024-05-18 ENCOUNTER — Ambulatory Visit: Admitting: Physical Medicine and Rehabilitation

## 2024-05-18 DIAGNOSIS — M48062 Spinal stenosis, lumbar region with neurogenic claudication: Secondary | ICD-10-CM

## 2024-05-18 DIAGNOSIS — M5416 Radiculopathy, lumbar region: Secondary | ICD-10-CM

## 2024-06-07 ENCOUNTER — Encounter: Admitting: Physical Medicine and Rehabilitation

## 2024-06-29 ENCOUNTER — Ambulatory Visit: Payer: Self-pay | Admitting: Family

## 2024-08-29 ENCOUNTER — Ambulatory Visit: Admitting: Adult Health
